# Patient Record
Sex: Male | Born: 1970 | Race: Black or African American | Hispanic: No | Marital: Single | Smoking: Former smoker
Health system: Southern US, Community
[De-identification: ages and names within clinical notes are randomized; demographics above are authoritative.]

## PROBLEM LIST (undated history)

## (undated) DIAGNOSIS — M1990 Unspecified osteoarthritis, unspecified site: Secondary | ICD-10-CM

## (undated) DIAGNOSIS — I10 Essential (primary) hypertension: Secondary | ICD-10-CM

## (undated) DIAGNOSIS — I429 Cardiomyopathy, unspecified: Secondary | ICD-10-CM

---

## 2003-10-15 ENCOUNTER — Emergency Department (HOSPITAL_COMMUNITY): Admission: EM | Admit: 2003-10-15 | Discharge: 2003-10-15 | Payer: Self-pay | Admitting: Emergency Medicine

## 2003-10-15 IMAGING — CR DG LUMBAR SPINE COMPLETE 4+V
5 series · 5 of 5 positions shown · non-contrast
Comparison: none

CLINICAL DATA: Low back pain radiating to right leg.  
LUMBAR SPINE  FOUR VIEWS
There is no evidence of lumbar spine fracture, spondylolysis, spondylolisthesis.  Mild degenerative disk space narrowing is seen at L4-5.  No other significant bony abnormalities are seen.
IMPRESSION
No acute radiographic findings.  
Mild degenerative disk disease.

[view not recorded (1 of 5)]
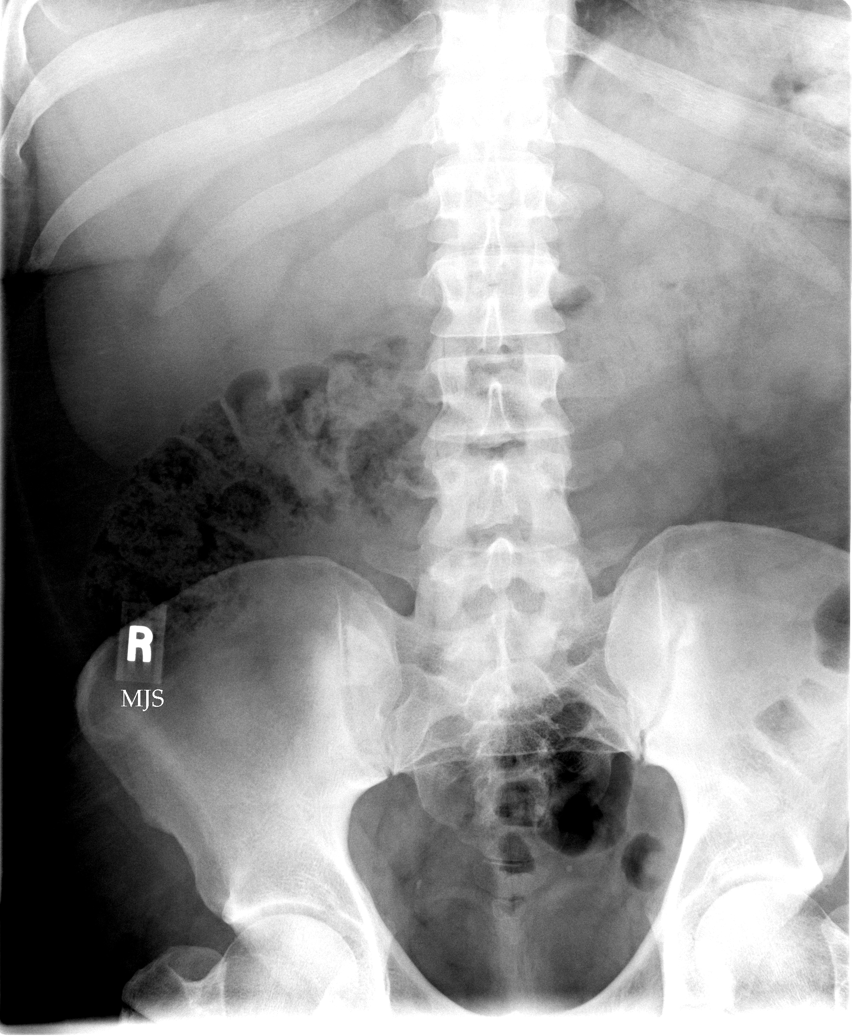

[view not recorded (2 of 5)]
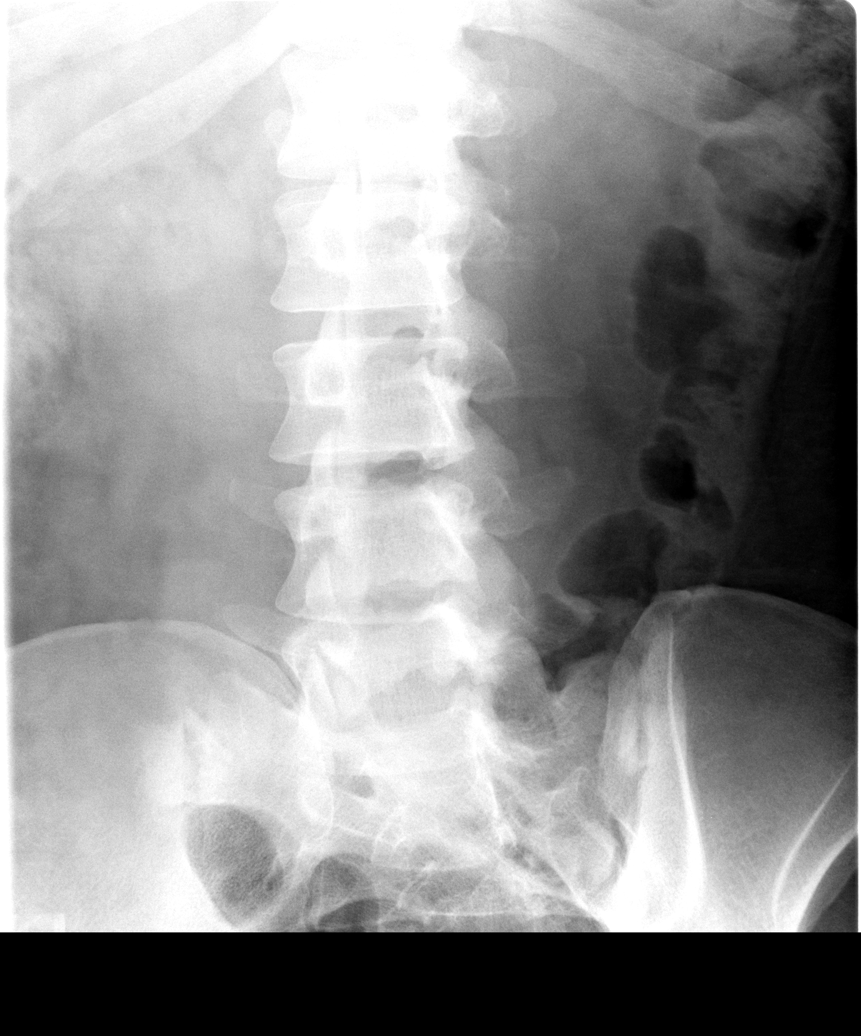

[view not recorded (3 of 5)]
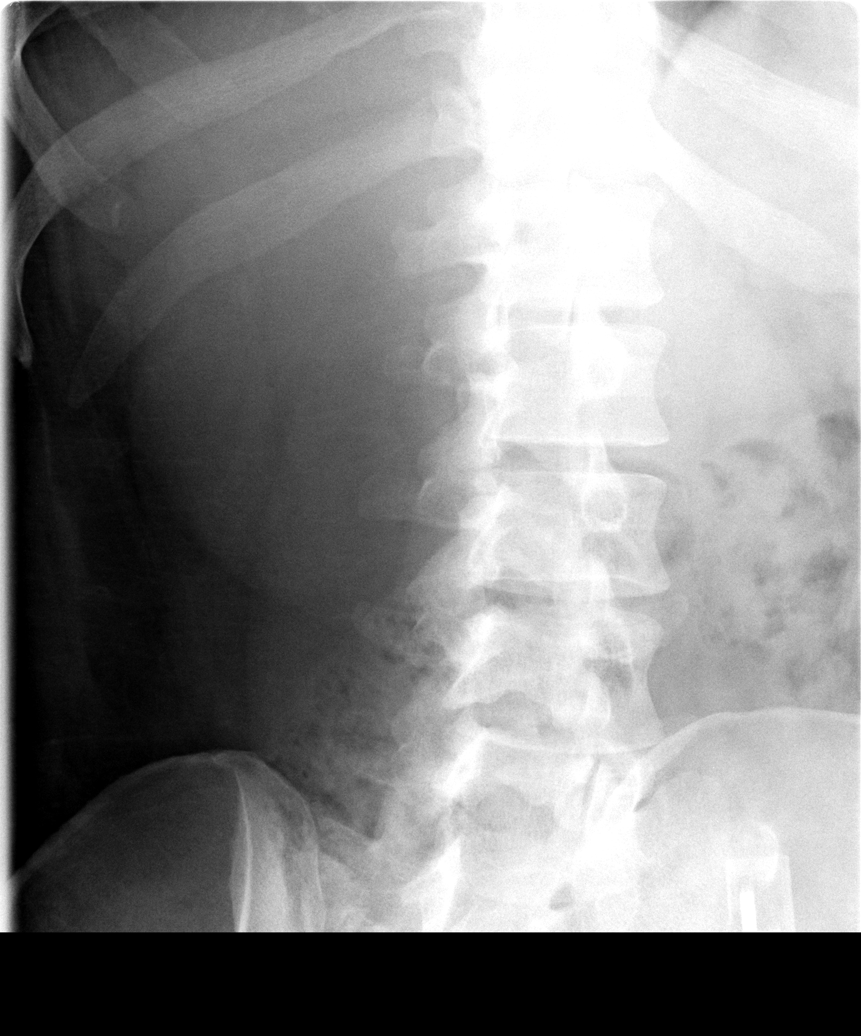

[view not recorded (4 of 5)]
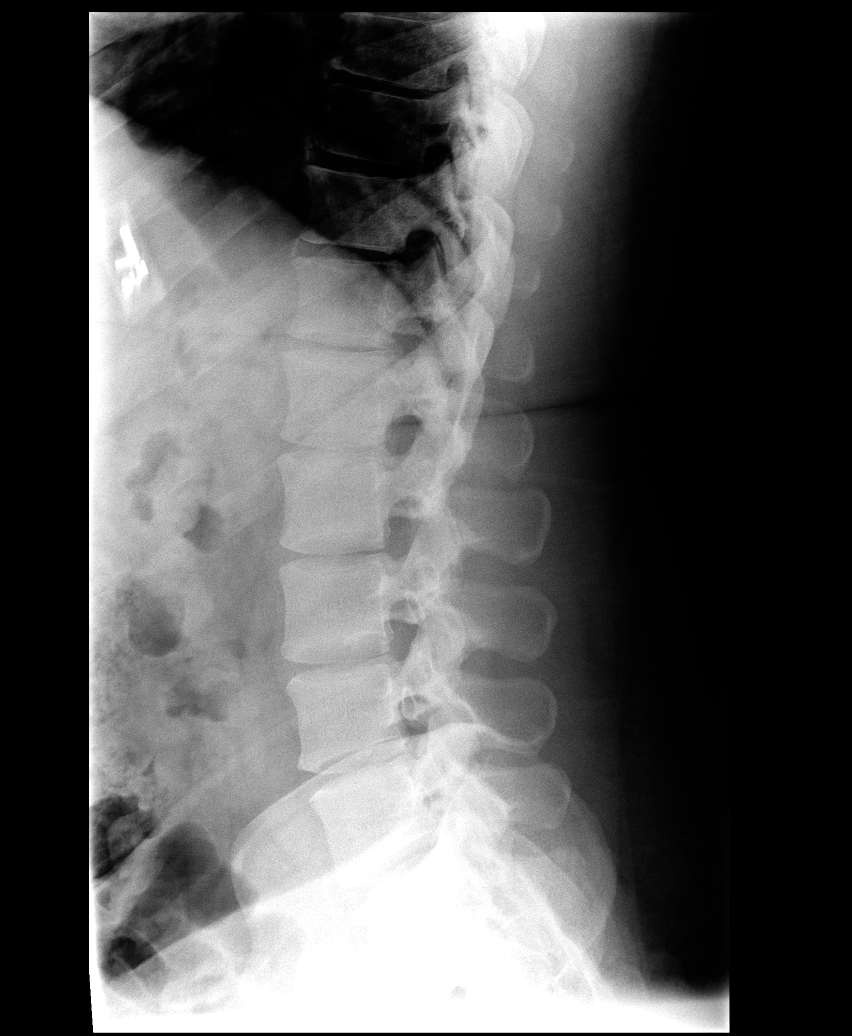

[view not recorded (5 of 5)]
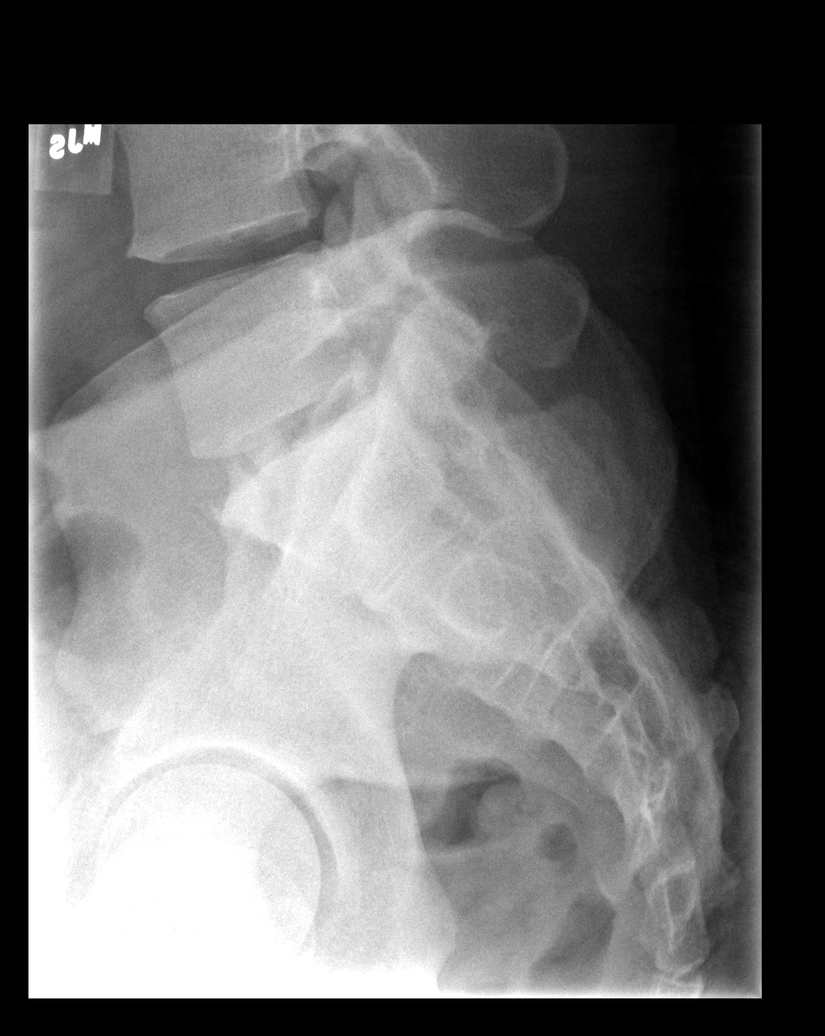

[5 of 5 positions shown; findings below may reference images not displayed]

## 2005-09-18 ENCOUNTER — Emergency Department (HOSPITAL_COMMUNITY): Admission: EM | Admit: 2005-09-18 | Discharge: 2005-09-18 | Payer: Self-pay | Admitting: Emergency Medicine

## 2007-01-01 ENCOUNTER — Emergency Department (HOSPITAL_COMMUNITY): Admission: EM | Admit: 2007-01-01 | Discharge: 2007-01-01 | Payer: Self-pay | Admitting: Emergency Medicine

## 2007-01-01 IMAGING — US US SCROTUM
1 series · 14 of 25 positions shown · non-contrast
Comparison: none

CLINICAL DATA: Left scrotal pain and mass.  
 SCROTAL ULTRASOUND:
 DOPPLER ULTRASOUND OF THE TESTICLES:
TECHNIQUE: Complete ultrasound examination of the testicles, epididymis, and other scrotal structures was performed.  Color and spectral Doppler ultrasound were also utilized to evaluate blood flow to the testicles.

[Series 1: unknown · 0.11mm/px · 14 of 50 slices shown]
[im 1/50]
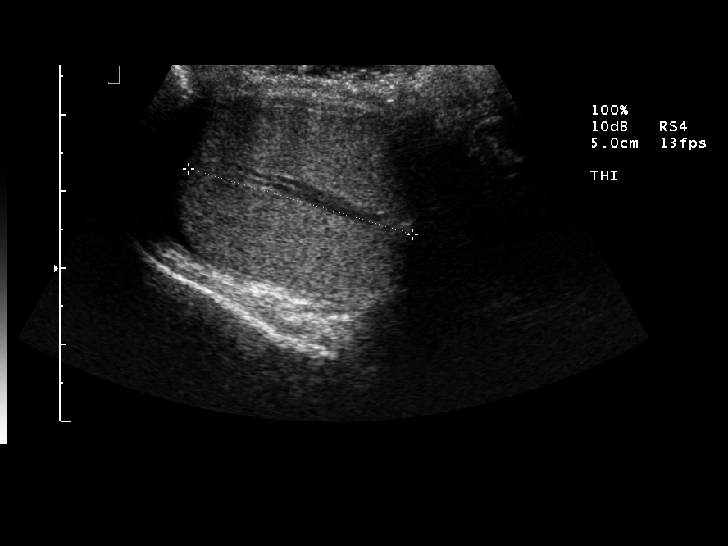
[im 5/50]
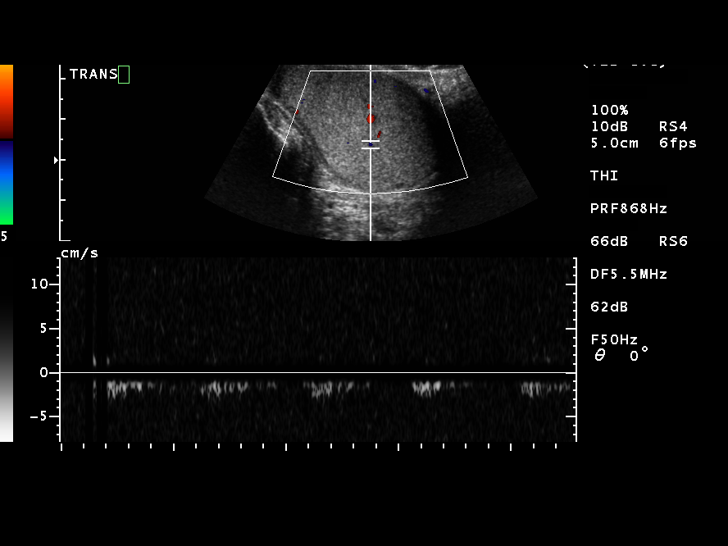
[im 9/50]
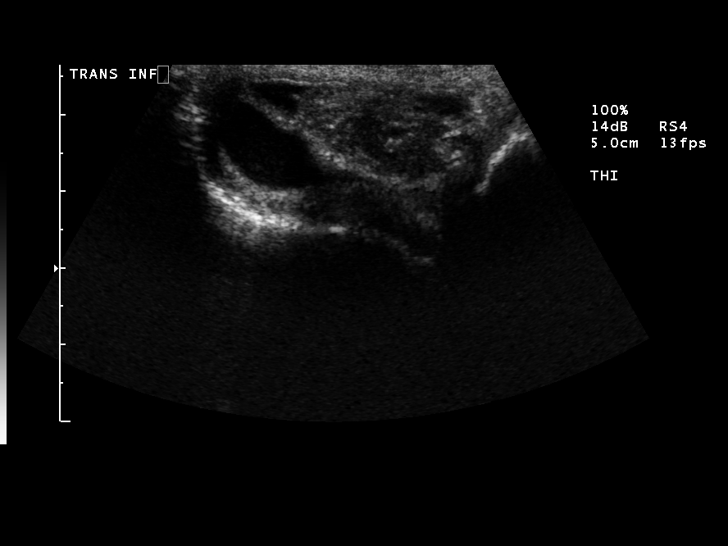
[im 13/50]
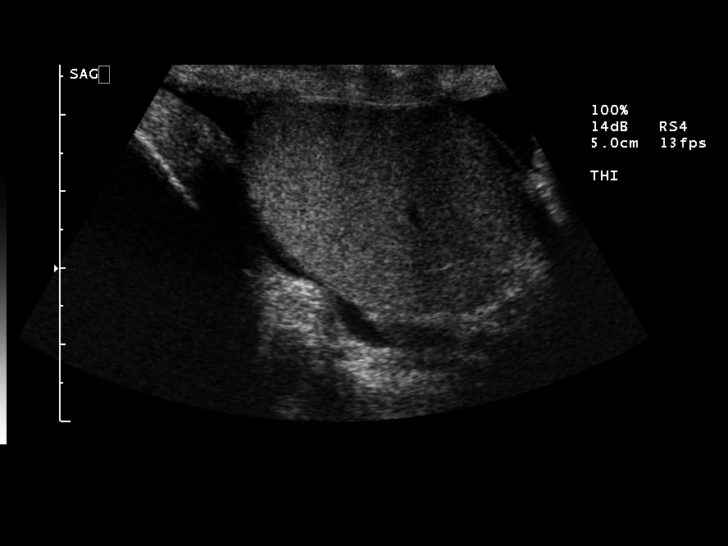
[im 17/50]
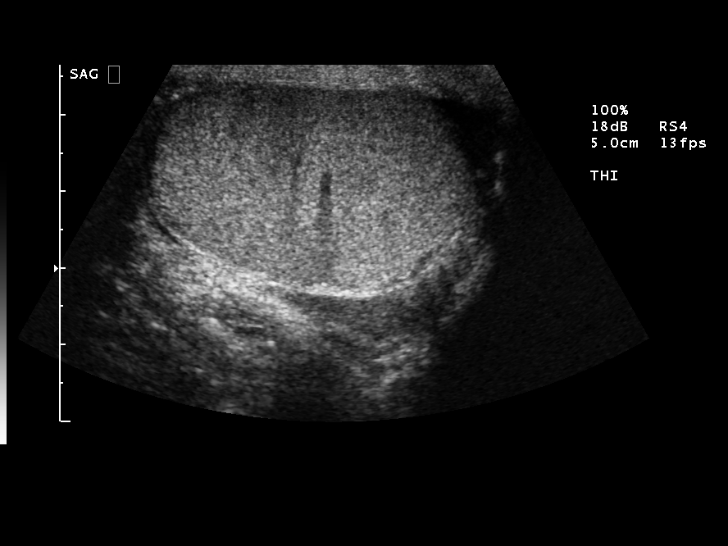
[im 19/50]
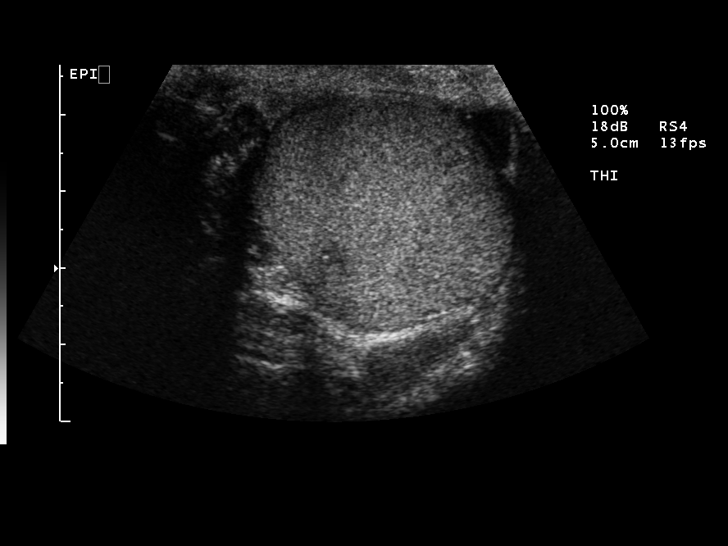
[im 23/50]
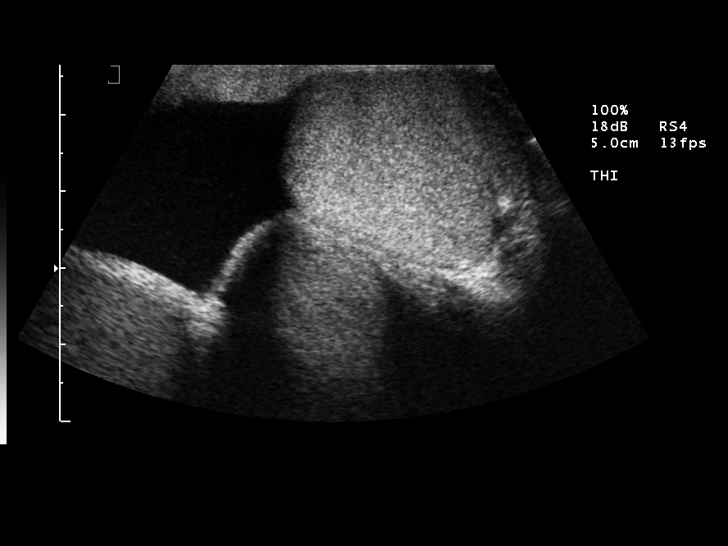
[im 27/50]
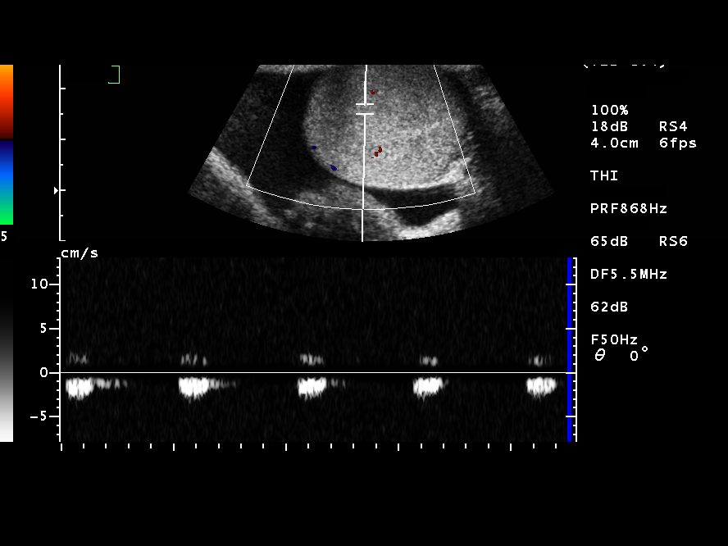
[im 31/50]
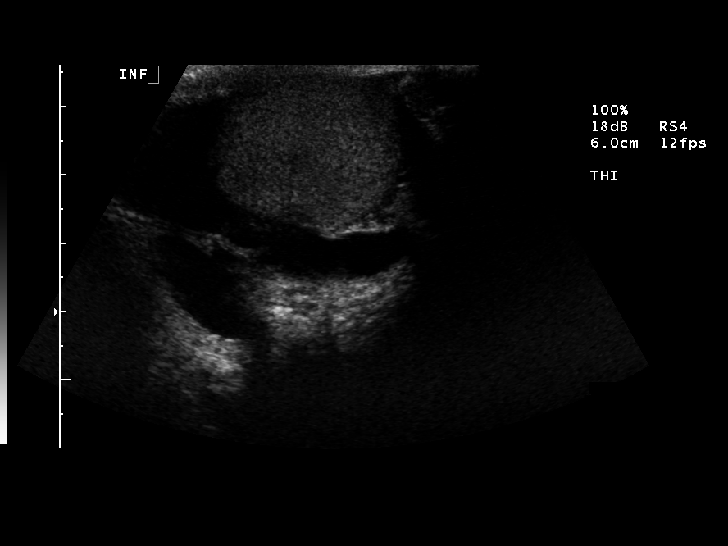
[im 33/50]
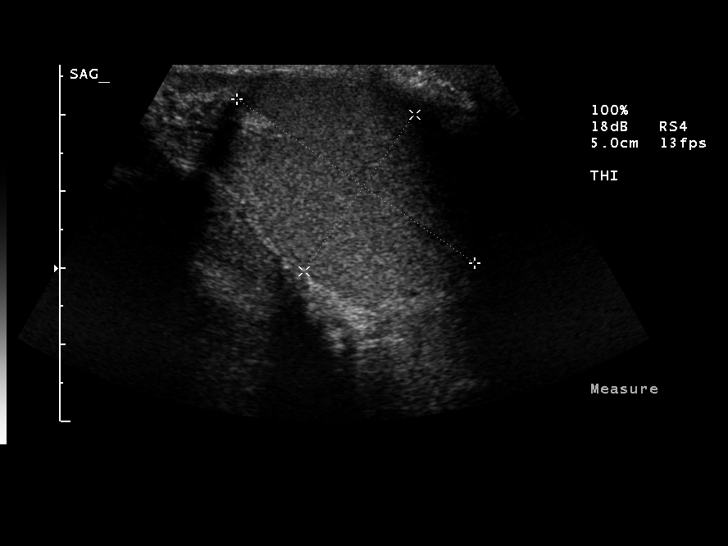
[im 37/50]
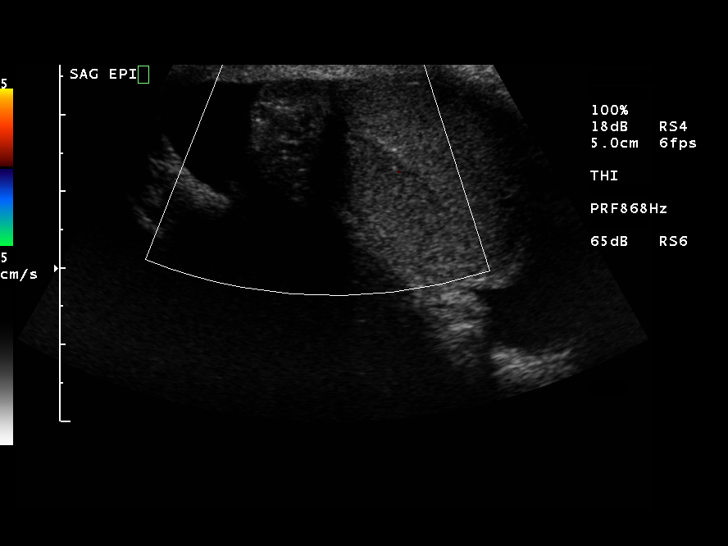
[im 41/50]
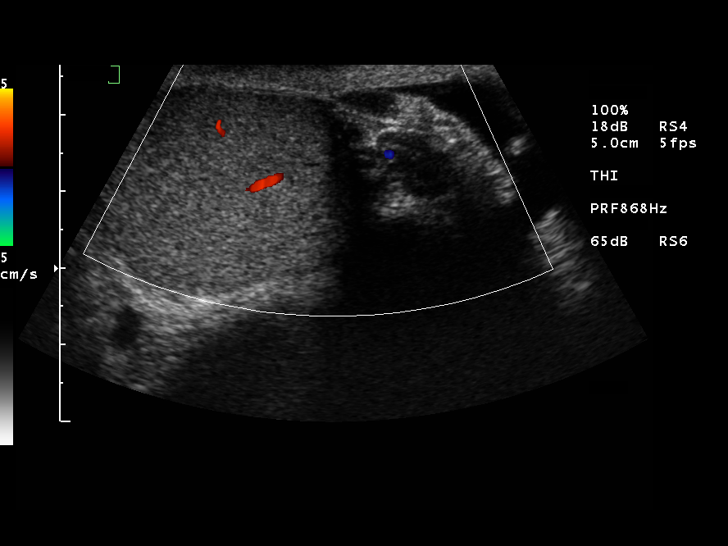
[im 45/50]
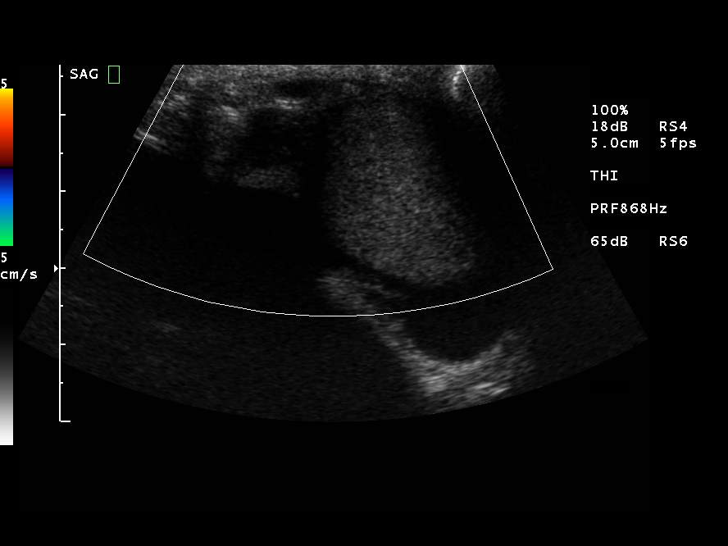
[im 50/50]
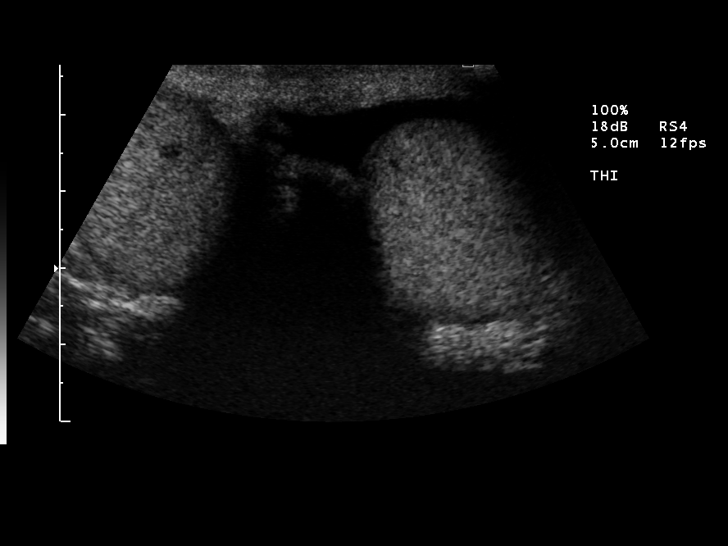

[14 of 25 positions shown; findings below may reference images not displayed]

FINDINGS: The testicles are symmetric in size and parenchymal echogenicity.  No testicular masses are seen.  
 Intratesticular blood flow is seen bilaterally on color Doppler ultrasound.  Spectral Doppler evaluation of the testicles shows the presence of both arterial and venous waveforms.  
 Large left hydrocele and small left hydrocele are visualized.  Left and right epididymides are unremarkable in appearance.
IMPRESSION: 1.  No evidence of testicular mass or torsion.  
 2.  Large left hydrocele.  Small right hydrocele.

## 2007-06-06 ENCOUNTER — Emergency Department (HOSPITAL_COMMUNITY): Admission: EM | Admit: 2007-06-06 | Discharge: 2007-06-06 | Payer: Self-pay | Admitting: Emergency Medicine

## 2007-06-08 ENCOUNTER — Emergency Department (HOSPITAL_COMMUNITY): Admission: EM | Admit: 2007-06-08 | Discharge: 2007-06-08 | Payer: Self-pay | Admitting: Emergency Medicine

## 2007-07-20 ENCOUNTER — Emergency Department (HOSPITAL_COMMUNITY): Admission: EM | Admit: 2007-07-20 | Discharge: 2007-07-20 | Payer: Self-pay | Admitting: Emergency Medicine

## 2007-11-12 ENCOUNTER — Emergency Department (HOSPITAL_COMMUNITY): Admission: EM | Admit: 2007-11-12 | Discharge: 2007-11-12 | Payer: Self-pay | Admitting: Emergency Medicine

## 2008-07-10 ENCOUNTER — Emergency Department (HOSPITAL_COMMUNITY): Admission: EM | Admit: 2008-07-10 | Discharge: 2008-07-10 | Payer: Self-pay | Admitting: Emergency Medicine

## 2009-01-27 ENCOUNTER — Emergency Department (HOSPITAL_COMMUNITY): Admission: EM | Admit: 2009-01-27 | Discharge: 2009-01-27 | Payer: Self-pay | Admitting: Family Medicine

## 2009-01-27 ENCOUNTER — Observation Stay (HOSPITAL_COMMUNITY): Admission: EM | Admit: 2009-01-27 | Discharge: 2009-01-29 | Payer: Self-pay | Admitting: Emergency Medicine

## 2009-01-27 IMAGING — CR DG CHEST 1V PORT
1 series · 1 of 1 positions shown · non-contrast
Comparison: None

CLINICAL DATA: History given of chest pain and shortness of breath.
History of tobacco smoking.  History of pain and left arm.

PORTABLE CHEST - 1 VIEW

[view not recorded]
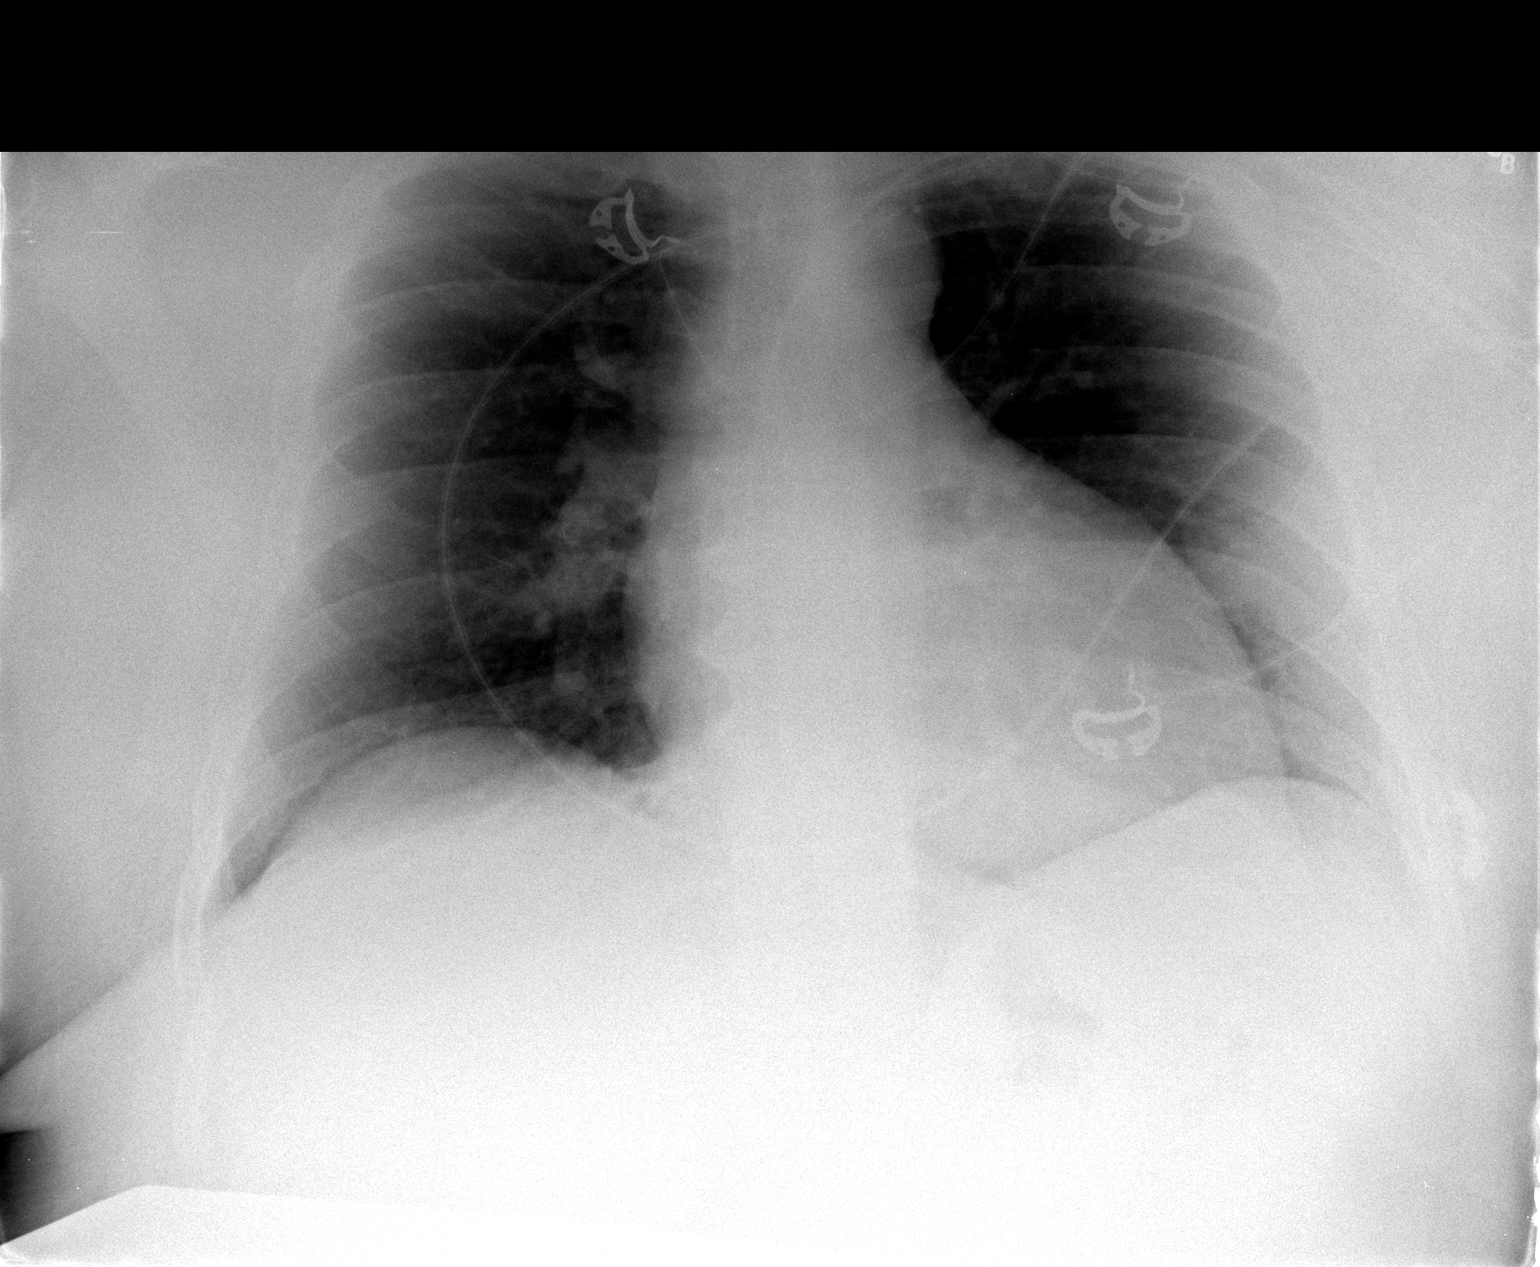

[1 of 1 positions shown; findings below may reference images not displayed]

FINDINGS: Cardiac silhouette is at the upper range of normal size.
No pleural abnormality is evident. The lungs are well aerated and
free of infiltrates. Osteophytes are seen in the spine.
IMPRESSION: Cardiac silhouette is upper limits of normal size accentuated by AP
magnification.  No pulmonary edema, pneumonia, or pleural effusion
is seen.  Osteophytes are present in the spine.

## 2010-01-29 ENCOUNTER — Emergency Department (HOSPITAL_COMMUNITY): Admission: EM | Admit: 2010-01-29 | Discharge: 2010-01-29 | Payer: Self-pay | Admitting: Family Medicine

## 2010-01-29 IMAGING — CR DG ANKLE COMPLETE 3+V*R*
5 series · 5 of 5 positions shown · non-contrast
Comparison: None

CLINICAL DATA: Injured ankle.

RIGHT ANKLE - COMPLETE 3+ VIEW

[view not recorded (1 of 5)]
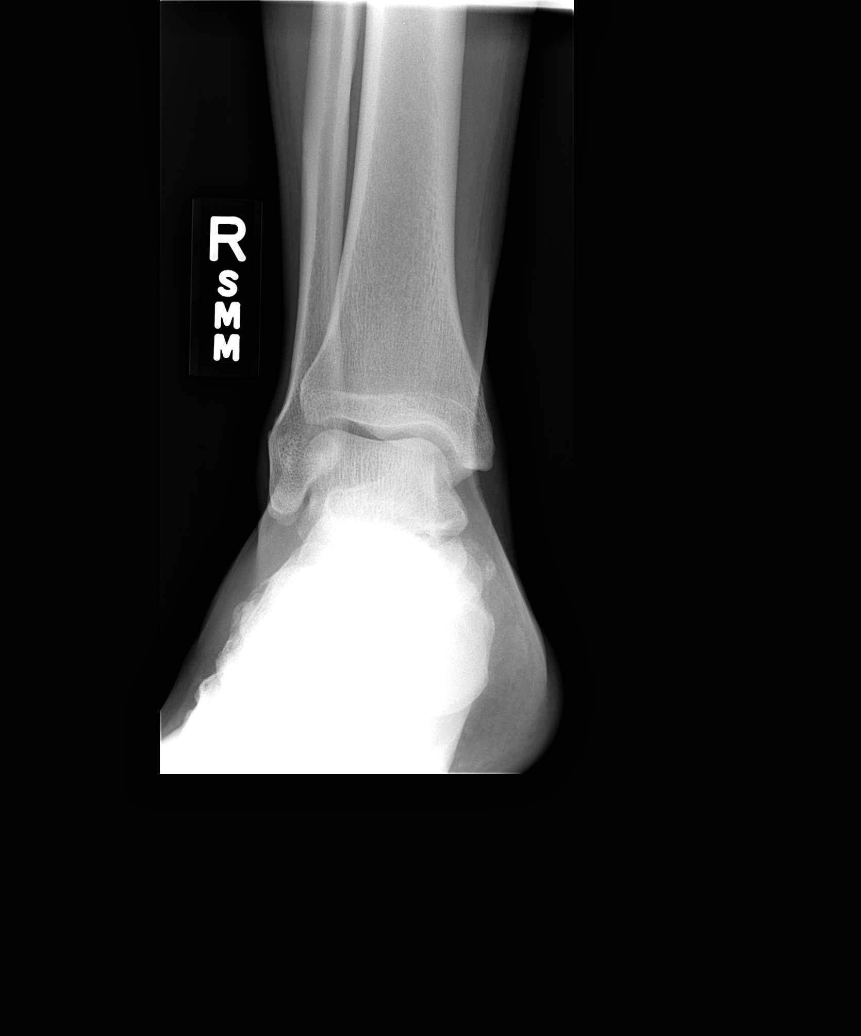

[view not recorded (2 of 5)]
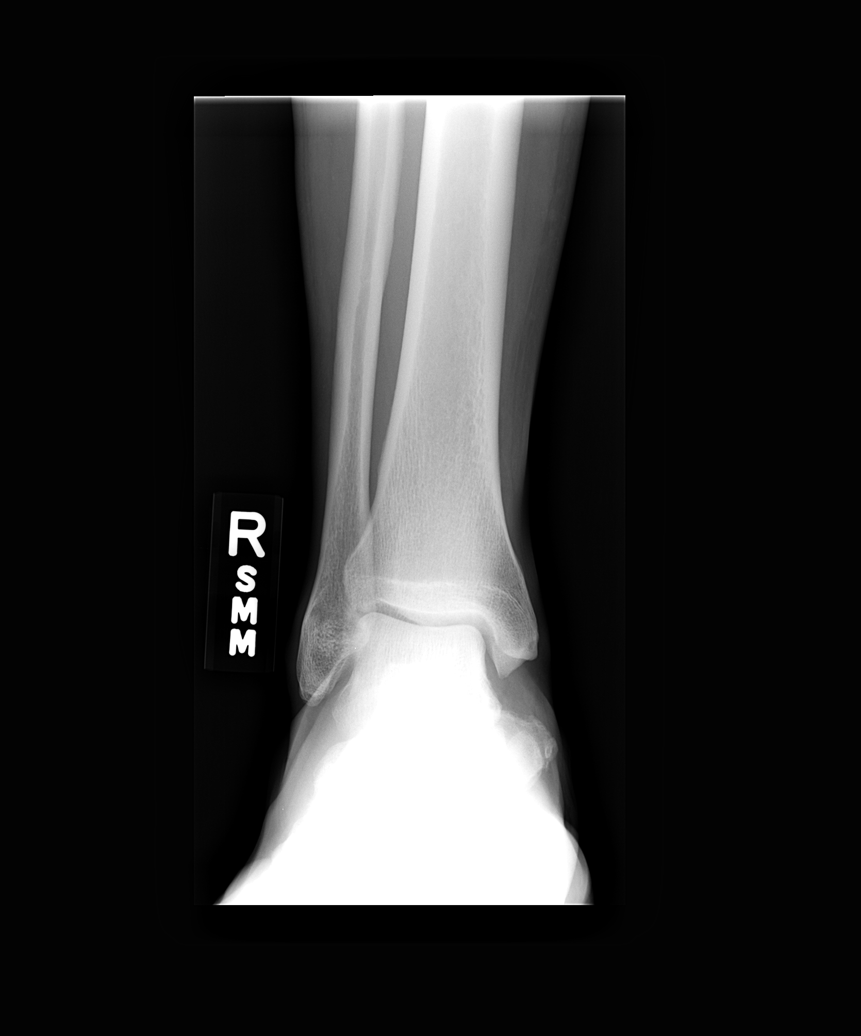

[view not recorded (3 of 5)]
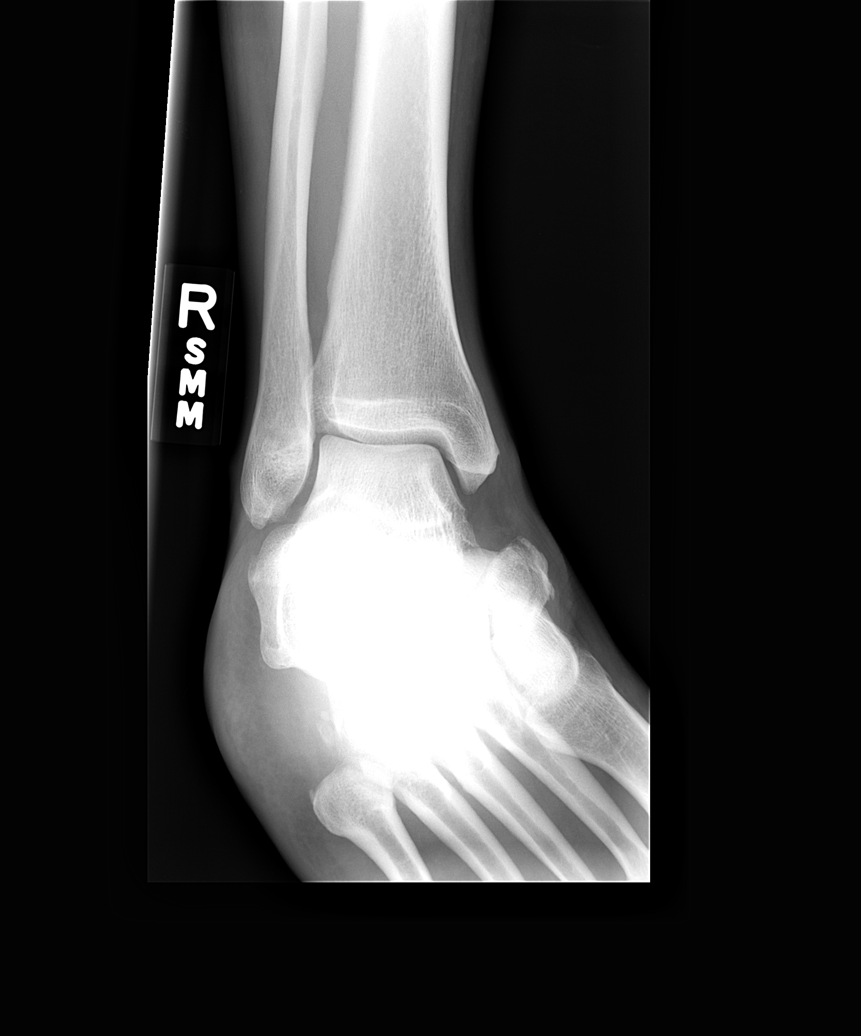

[view not recorded (4 of 5)]
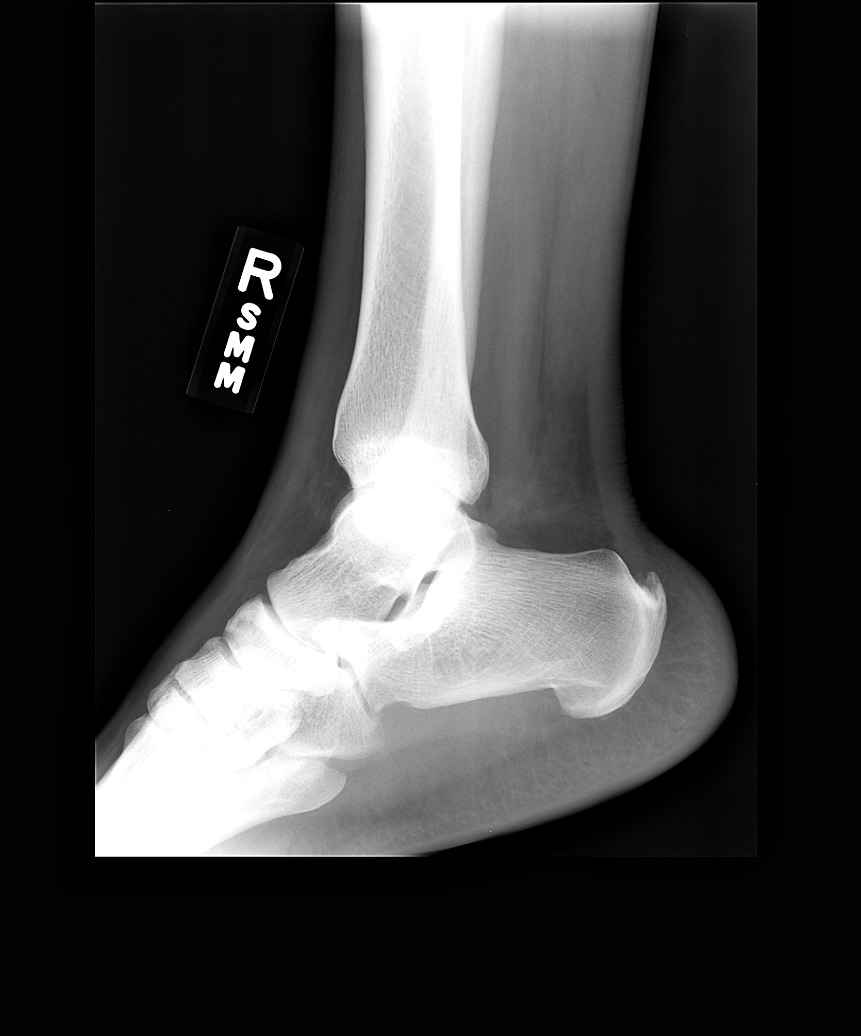

[view not recorded (5 of 5)]
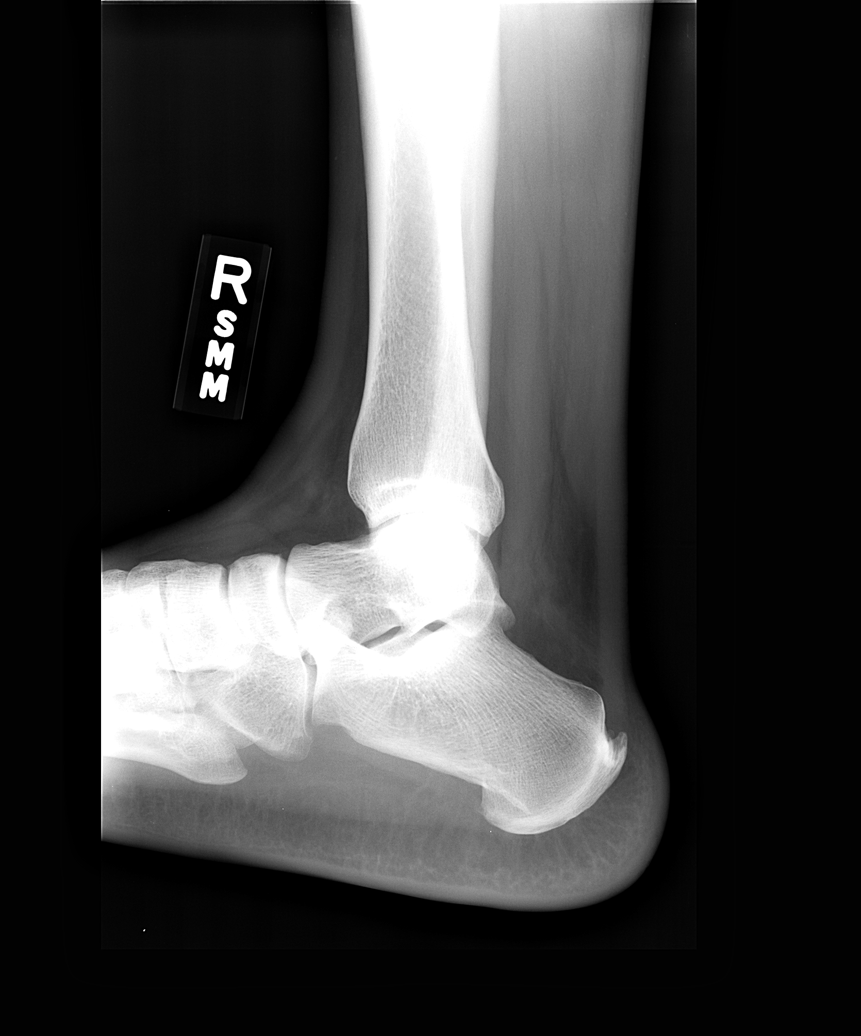

[5 of 5 positions shown; findings below may reference images not displayed]

FINDINGS: The ankle mortise is maintained.  No acute ankle
fracture.  Mild spurring change noted at the Achilles attachment
site on the calcaneus.  Os naviculare is noted.  No osteochondral
lesions.
IMPRESSION: No acute bony findings.

## 2010-08-06 LAB — PROTIME-INR
INR: 1 (ref 0.00–1.49)
Prothrombin Time: 13.1 seconds (ref 11.6–15.2)

## 2010-08-06 LAB — HEMOGLOBIN A1C: Mean Plasma Glucose: 128 mg/dL

## 2010-08-06 LAB — COMPREHENSIVE METABOLIC PANEL
ALT: 63 U/L — ABNORMAL HIGH (ref 0–53)
Calcium: 9.4 mg/dL (ref 8.4–10.5)
Chloride: 107 mEq/L (ref 96–112)
Creatinine, Ser: 0.97 mg/dL (ref 0.4–1.5)
GFR calc Af Amer: >60 mL/min (ref >60)
Potassium: 3.7 mEq/L (ref 3.5–5.1)
Total Protein: 7.6 g/dL (ref 6.0–8.3)

## 2010-08-06 LAB — TSH: TSH: 1.825 u[IU]/mL (ref 0.350–4.500)

## 2010-08-06 LAB — BASIC METABOLIC PANEL
BUN: 12 mg/dL (ref 6–23)
CO2: 26 mEq/L (ref 19–32)
Chloride: 103 mEq/L (ref 96–112)
Creatinine, Ser: 1.06 mg/dL (ref 0.4–1.5)
GFR calc Af Amer: >60 mL/min (ref >60)
GFR calc non Af Amer: >60 mL/min (ref >60)
Sodium: 139 mEq/L (ref 135–145)

## 2010-08-06 LAB — CARDIAC PANEL(CRET KIN+CKTOT+MB+TROPI)
CK, MB: 1.3 ng/mL (ref 0.3–4.0)
Relative Index: 0.5 (ref 0.0–2.5)
Relative Index: 0.7 (ref 0.0–2.5)
Relative Index: 0.7 (ref 0.0–2.5)
Total CK: 219 U/L (ref 7–232)
Troponin I: 0.02 ng/mL (ref 0.00–0.06)

## 2010-08-06 LAB — POCT I-STAT, CHEM 8
Calcium, Ion: 1.2 mmol/L (ref 1.12–1.32)
Glucose, Bld: 82 mg/dL (ref 70–99)
Hemoglobin: 15.6 g/dL (ref 13.0–17.0)
TCO2: 27 mmol/L (ref 0–100)

## 2010-08-06 LAB — POCT CARDIAC MARKERS
CKMB, poc: <1 ng/mL — ABNORMAL LOW (ref 1.0–8.0)
Myoglobin, poc: 122 ng/mL (ref 12–200)
Myoglobin, poc: 124 ng/mL (ref 12–200)
Troponin i, poc: <0.05 ng/mL (ref 0.00–0.09)

## 2010-08-06 LAB — LIPID PANEL
Cholesterol: 224 mg/dL — ABNORMAL HIGH (ref 0–200)
LDL Cholesterol: 153 mg/dL — ABNORMAL HIGH (ref 0–99)
Total CHOL/HDL Ratio: 6.1 RATIO
VLDL: 34 mg/dL (ref 0–40)

## 2010-08-06 LAB — CBC
Hemoglobin: 15.3 g/dL (ref 13.0–17.0)
Platelets: 277 10*3/uL (ref 150–400)
Platelets: 290 10*3/uL (ref 150–400)
RDW: 13.4 % (ref 11.5–15.5)
WBC: 7.4 10*3/uL (ref 4.0–10.5)
WBC: 8.3 10*3/uL (ref 4.0–10.5)

## 2010-08-06 LAB — DIFFERENTIAL
Eosinophils Absolute: 0.1 10*3/uL (ref 0.0–0.7)
Lymphocytes Relative: 30 % (ref 12–46)
Lymphs Abs: 2.2 10*3/uL (ref 0.7–4.0)
Monocytes Relative: 6 % (ref 3–12)
Neutro Abs: 4.6 10*3/uL (ref 1.7–7.7)
Neutrophils Relative %: 62 % (ref 43–77)

## 2010-08-06 LAB — APTT: aPTT: 28 seconds (ref 24–37)

## 2010-08-12 LAB — URINALYSIS, ROUTINE W REFLEX MICROSCOPIC
Ketones, ur: NEGATIVE mg/dL
Protein, ur: NEGATIVE mg/dL
Specific Gravity, Urine: 1.027 (ref 1.005–1.030)
Urobilinogen, UA: 0.2 mg/dL (ref 0.0–1.0)

## 2010-08-12 LAB — GC/CHLAMYDIA PROBE AMP, GENITAL
Chlamydia, DNA Probe: NEGATIVE
GC Probe Amp, Genital: NEGATIVE

## 2010-12-24 ENCOUNTER — Ambulatory Visit (INDEPENDENT_AMBULATORY_CARE_PROVIDER_SITE_OTHER): Payer: Self-pay

## 2010-12-24 ENCOUNTER — Inpatient Hospital Stay (INDEPENDENT_AMBULATORY_CARE_PROVIDER_SITE_OTHER)
Admission: RE | Admit: 2010-12-24 | Discharge: 2010-12-24 | Disposition: A | Discharge status: Home / Self Care | Payer: Self-pay | Source: Ambulatory Visit | Attending: Family Medicine | Admitting: Family Medicine

## 2010-12-24 DIAGNOSIS — M1990 Unspecified osteoarthritis, unspecified site: Secondary | ICD-10-CM

## 2010-12-24 DIAGNOSIS — I10 Essential (primary) hypertension: Secondary | ICD-10-CM

## 2010-12-24 LAB — POCT I-STAT, CHEM 8
BUN: 13 mg/dL (ref 6–23)
Creatinine, Ser: 1.2 mg/dL (ref 0.50–1.35)
Glucose, Bld: 100 mg/dL — ABNORMAL HIGH (ref 70–99)
Hemoglobin: 16 g/dL (ref 13.0–17.0)
TCO2: 28 mmol/L (ref 0–100)

## 2010-12-24 IMAGING — CR DG KNEE COMPLETE 4+V*R*
5 series · 5 of 5 positions shown · non-contrast
Comparison: None.

CLINICAL DATA: Right knee pain.  Paroxysmal onset of pain.  No
injury.

RIGHT KNEE - COMPLETE 4+ VIEW

[view not recorded (1 of 5)]
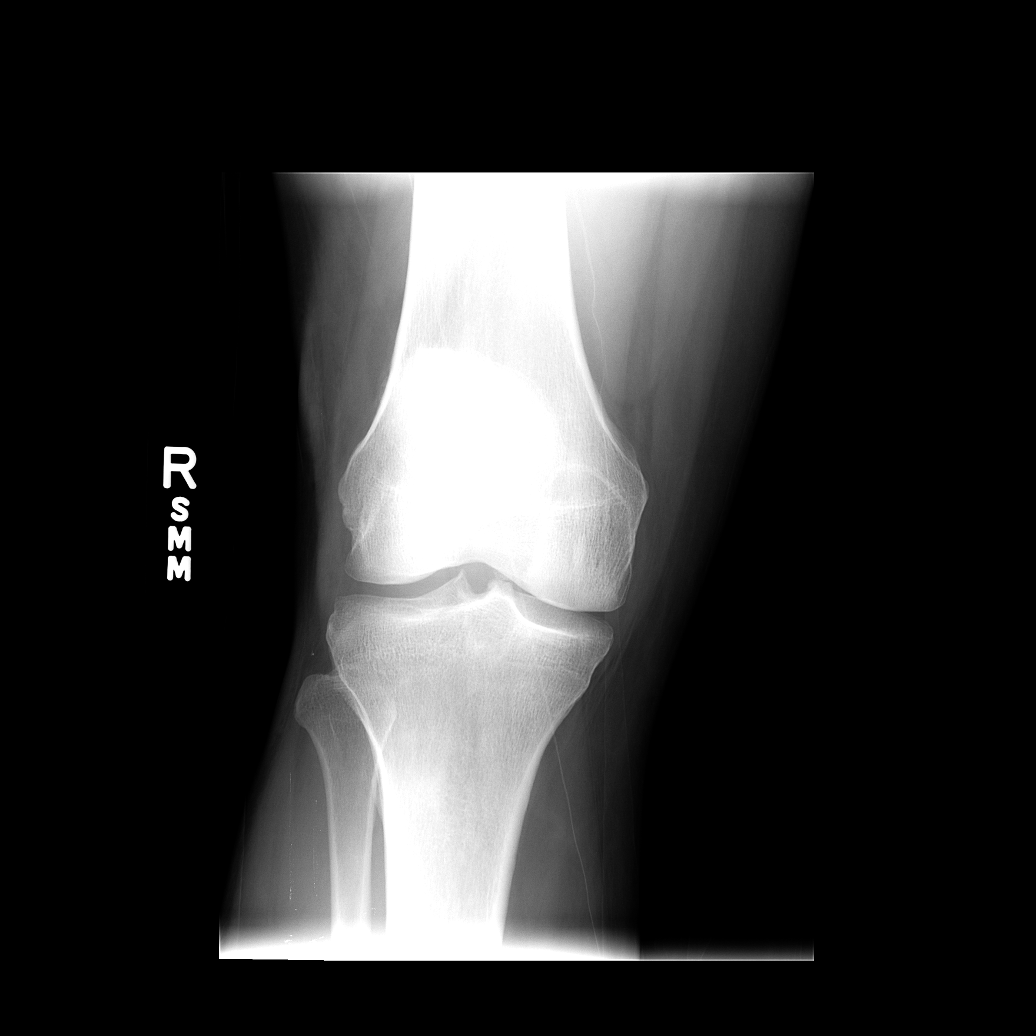

[view not recorded (2 of 5)]
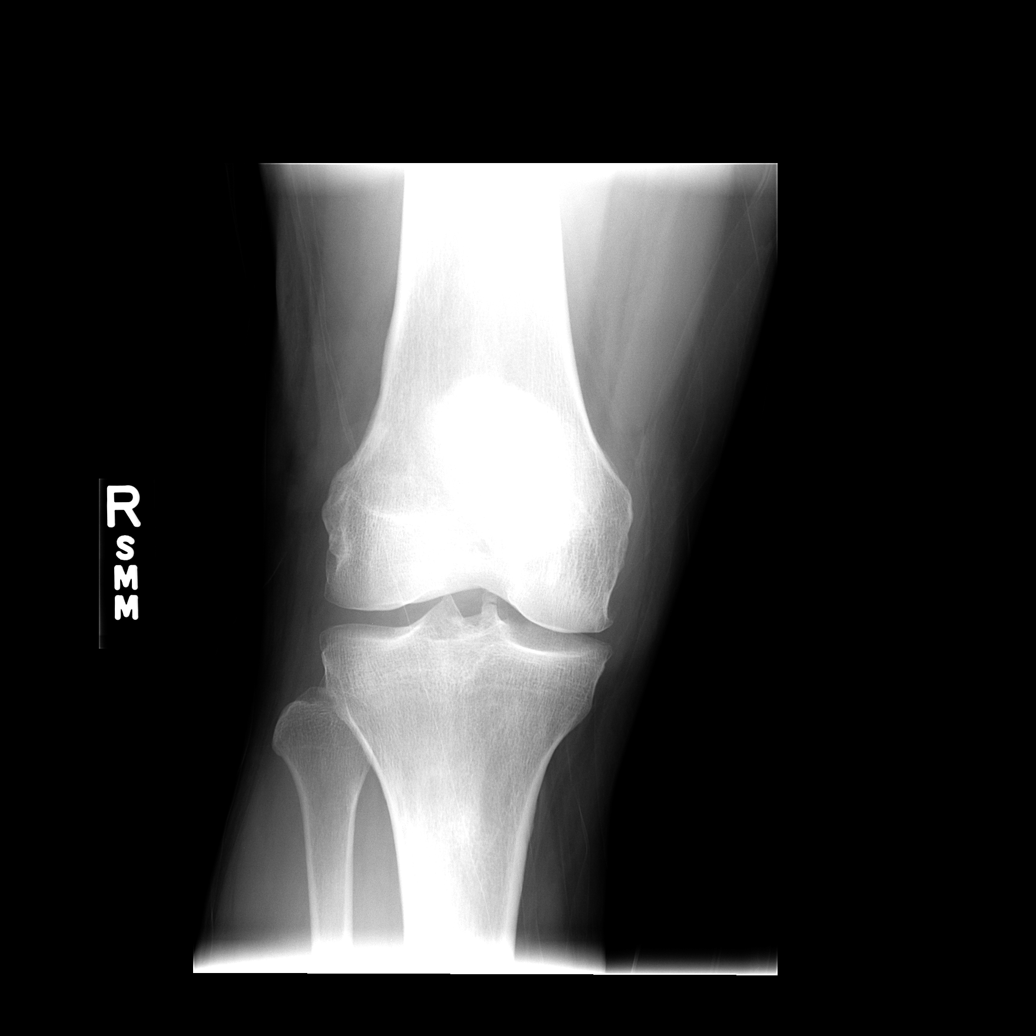

[view not recorded (3 of 5)]
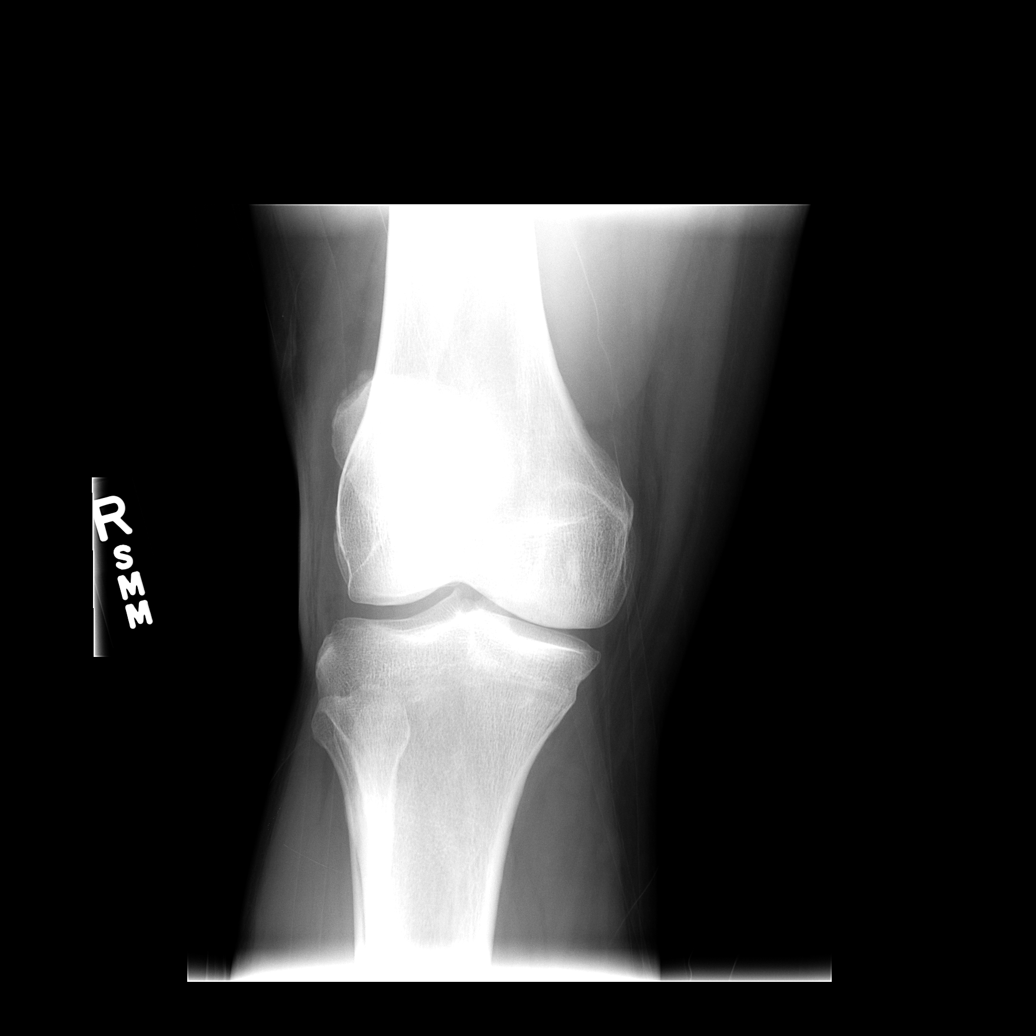

[view not recorded (4 of 5)]
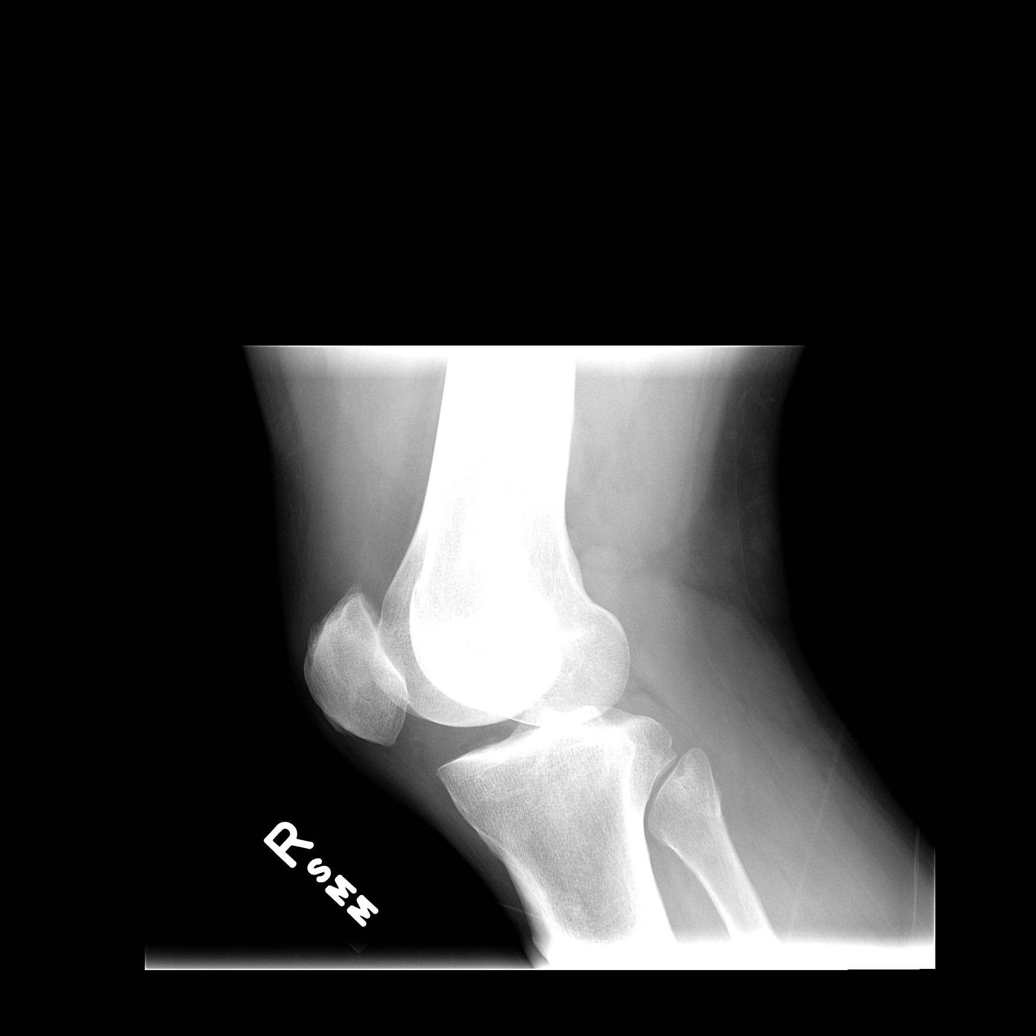

[view not recorded (5 of 5)]
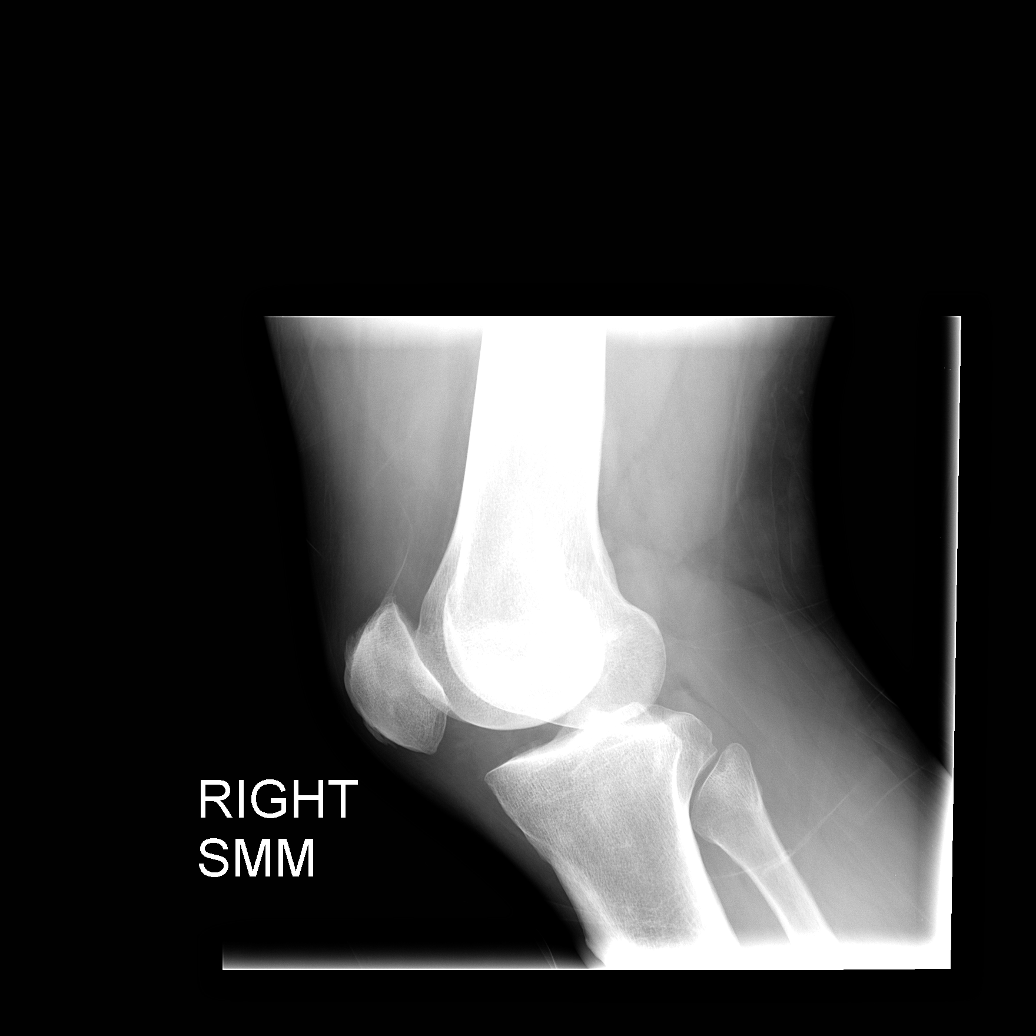

[5 of 5 positions shown; findings below may reference images not displayed]

FINDINGS: There is an old medial tibial spine fracture which is
nondisplaced.  Mild medial compartment osteoarthritis is present
with small marginal osteophytes.  There is no effusion.  Mild
patellofemoral osteoarthritis is present.  No fracture.  Anatomic
alignment.
IMPRESSION: Mild medial and patellofemoral osteoarthritis.

## 2011-01-21 LAB — URINALYSIS, ROUTINE W REFLEX MICROSCOPIC
Glucose, UA: NEGATIVE
Hgb urine dipstick: NEGATIVE
Hgb urine dipstick: NEGATIVE
Ketones, ur: NEGATIVE
Protein, ur: NEGATIVE
Protein, ur: NEGATIVE
Specific Gravity, Urine: 1.036 — ABNORMAL HIGH
Urobilinogen, UA: 1
pH: 7.5

## 2011-01-21 LAB — GC/CHLAMYDIA PROBE AMP, GENITAL
Chlamydia, DNA Probe: NEGATIVE
GC Probe Amp, Genital: NEGATIVE
GC Probe Amp, Genital: NEGATIVE

## 2011-01-27 LAB — URINALYSIS, ROUTINE W REFLEX MICROSCOPIC
Bilirubin Urine: NEGATIVE
Glucose, UA: NEGATIVE
Hgb urine dipstick: NEGATIVE
Ketones, ur: NEGATIVE
Protein, ur: NEGATIVE
pH: 5.5

## 2011-01-27 LAB — GC/CHLAMYDIA PROBE AMP, GENITAL
Chlamydia, DNA Probe: NEGATIVE
GC Probe Amp, Genital: NEGATIVE

## 2011-01-27 LAB — URINE MICROSCOPIC-ADD ON

## 2011-02-11 LAB — URINALYSIS, ROUTINE W REFLEX MICROSCOPIC
Hgb urine dipstick: NEGATIVE
Nitrite: NEGATIVE
Protein, ur: NEGATIVE
Specific Gravity, Urine: 1.017
Urobilinogen, UA: 1

## 2011-02-11 LAB — URINE MICROSCOPIC-ADD ON

## 2011-02-11 LAB — GC/CHLAMYDIA PROBE AMP, GENITAL: Chlamydia, DNA Probe: NEGATIVE

## 2011-02-11 LAB — URINE CULTURE: Culture: NO GROWTH

## 2014-04-29 ENCOUNTER — Emergency Department (INDEPENDENT_AMBULATORY_CARE_PROVIDER_SITE_OTHER)
Admission: EM | Admit: 2014-04-29 | Discharge: 2014-04-29 | Disposition: A | Discharge status: Home / Self Care | Payer: Self-pay | Source: Home / Self Care | Attending: Family Medicine | Admitting: Family Medicine

## 2014-04-29 ENCOUNTER — Encounter (HOSPITAL_COMMUNITY): Payer: Self-pay | Admitting: *Deleted

## 2014-04-29 ENCOUNTER — Emergency Department (INDEPENDENT_AMBULATORY_CARE_PROVIDER_SITE_OTHER): Payer: Self-pay

## 2014-04-29 DIAGNOSIS — M1711 Unilateral primary osteoarthritis, right knee: Secondary | ICD-10-CM

## 2014-04-29 DIAGNOSIS — M25579 Pain in unspecified ankle and joints of unspecified foot: Secondary | ICD-10-CM

## 2014-04-29 DIAGNOSIS — M25473 Effusion, unspecified ankle: Secondary | ICD-10-CM

## 2014-04-29 IMAGING — CR DG KNEE 1-2V*R*
2 series · 2 of 2 positions shown · non-contrast
Comparison: [DATE]

CLINICAL DATA: Pain and swelling.  No known injury.

EXAM:
RIGHT KNEE - 1-2 VIEW

[knee ap]
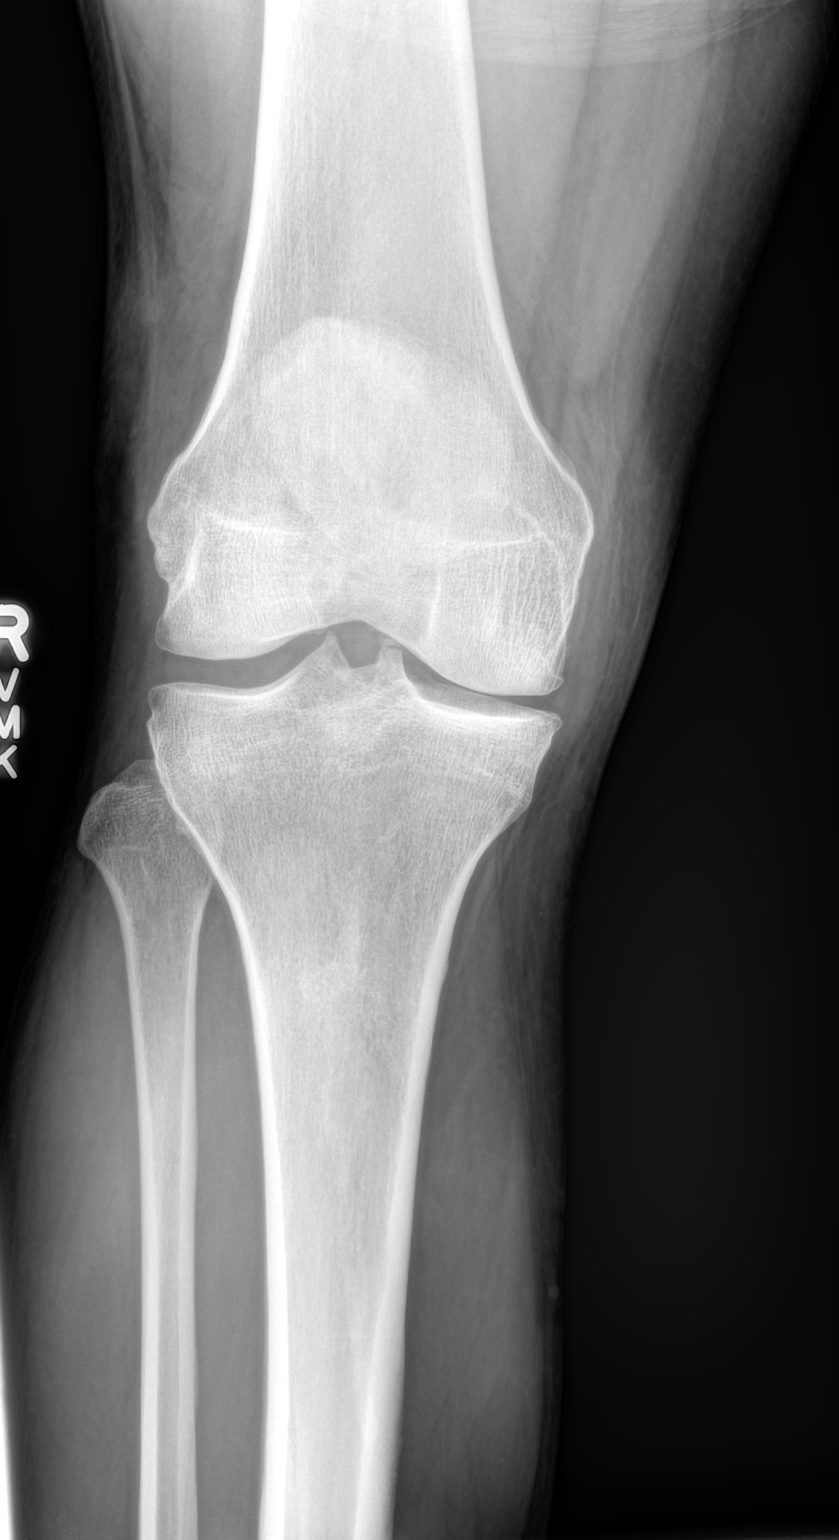

[knee lat]
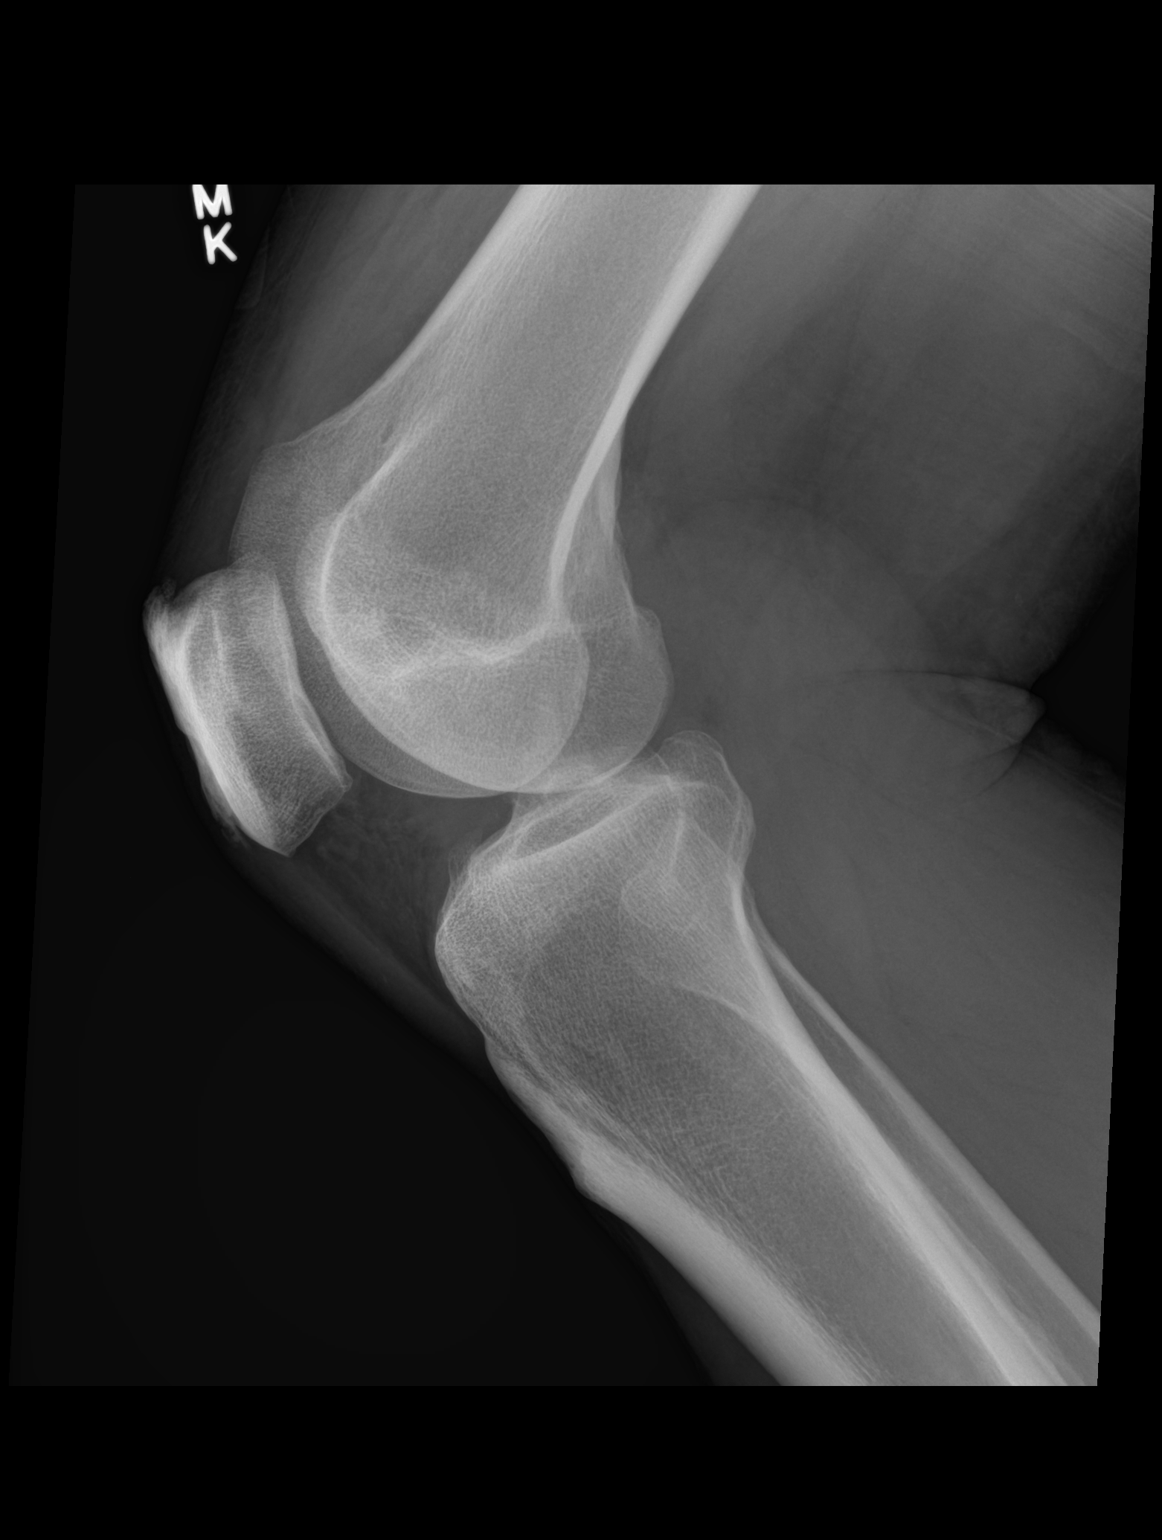

[2 of 2 positions shown; findings below may reference images not displayed]

FINDINGS: No visible effusion. There is medial compartment osteoarthritis with
mild joint space narrowing and marginal osteophytes. Patellofemoral
joint and lateral compartment appear within normal limits. No other
focal finding.
IMPRESSION: Medial compartment osteoarthritis with mild joint space narrowing
and marginal osteophytes, worsened since [DATE].

## 2014-04-29 MED ORDER — DICLOFENAC SODIUM 1 % TD GEL: 4.0000 g | Freq: Four times a day (QID) | TRANSDERMAL | Status: DC

## 2014-04-29 NOTE — Discharge Instructions (Signed)
See orthopedist for follow-up , use medicine as needed, see your doctor to restart blood pressure medicine.

## 2014-04-29 NOTE — ED Provider Notes (Signed)
CSN: [id number]     Arrival date & time 04/29/14  1504 History   First MD Initiated Contact with Patient 04/29/14 1533     Chief Complaint  Patient presents with  . Knee Pain   (Consider location/radiation/quality/duration/timing/severity/associated sxs/prior Treatment) Patient is a 43 y.o. male presenting with knee pain. The history is provided by the patient.  Knee Pain Location:  Knee Time since incident:  3 weeks Injury: no   Knee location:  R knee Pain details:    Quality:  Sharp   Radiates to:  Does not radiate   Severity:  Moderate   Onset quality:  Gradual Chronicity:  Chronic (told by prior eval and by his mother that he has arthritis.) Dislocation: no   Prior injury to area:  No Ineffective treatments:  Acetaminophen Associated symptoms: decreased ROM   Associated symptoms: no fever and no swelling     History reviewed. No pertinent past medical history. History reviewed. No pertinent past surgical history. History reviewed. No pertinent family history. History  Substance Use Topics  . Smoking status: Never Smoker   . Smokeless tobacco: Not on file  . Alcohol Use: No    Review of Systems  Constitutional: Negative.  Negative for fever.  Musculoskeletal: Positive for joint swelling and arthralgias. Negative for gait problem.  Skin: Negative.     Allergies  Review of patient's allergies indicates no known allergies.  Home Medications   Prior to Admission medications   Medication Sig Start Date End Date Taking? Authorizing Provider  diclofenac sodium (VOLTAREN) 1 % GEL Apply 4 g topically 4 (four) times daily. 04/29/14   Linna Hoff, MD   BP 169/106 mmHg  Pulse 89  Temp(Src) 99.1 F (37.3 C) (Oral)  Resp 16  SpO2 97% Physical Exam  Constitutional: He is oriented to person, place, and time. He appears well-developed and well-nourished.  Musculoskeletal: He exhibits tenderness.       Right knee: He exhibits swelling and bony tenderness. He exhibits  no effusion, normal patellar mobility, normal meniscus and no MCL laxity.  Neurological: He is alert and oriented to person, place, and time.  Skin: Skin is warm and dry.  Nursing note and vitals reviewed.   ED Course  Procedures (including critical care time) Labs Review Labs Reviewed - No data to display  Imaging Review Dg Knee 2 Views Right  04/29/2014   CLINICAL DATA:  Pain and swelling.  No known injury.  EXAM: RIGHT KNEE - 1-2 VIEW  COMPARISON:  12/24/2010  FINDINGS: No visible effusion. There is medial compartment osteoarthritis with mild joint space narrowing and marginal osteophytes. Patellofemoral joint and lateral compartment appear within normal limits. No other focal finding.  IMPRESSION: Medial compartment osteoarthritis with mild joint space narrowing and marginal osteophytes, worsened since 2012.   Electronically Signed   By: Paulina Fusi M.D.   On: 04/29/2014 16:02     MDM   1. Primary osteoarthritis of right knee   2. Pain and swelling of ankle   3. Pain and swelling of ankle        Linna Hoff, MD 04/29/14 [phone]

## 2014-04-29 NOTE — ED Notes (Addendum)
Pt reports  Pain /  Swelling  Of the  r  Knee     For  sev  Weeks    He  denys   Any  specefic  Injury however  Reports  Sensation  Of  Grinding  Sensation        Of the  Affected  Knee        Pt  Also reports  A  History  Of    HTN      Non  Compliant  On  His  bp meds    C/o occasonal  headache

## 2014-08-25 ENCOUNTER — Emergency Department (HOSPITAL_COMMUNITY)
Admission: EM | Admit: 2014-08-25 | Discharge: 2014-08-25 | Disposition: A | Discharge status: Home / Self Care | Payer: Self-pay | Attending: Emergency Medicine | Admitting: Emergency Medicine

## 2014-08-25 ENCOUNTER — Encounter (HOSPITAL_COMMUNITY): Payer: Self-pay | Admitting: *Deleted

## 2014-08-25 DIAGNOSIS — I10 Essential (primary) hypertension: Secondary | ICD-10-CM | POA: Insufficient documentation

## 2014-08-25 DIAGNOSIS — R079 Chest pain, unspecified: Secondary | ICD-10-CM | POA: Insufficient documentation

## 2014-08-25 DIAGNOSIS — Z79899 Other long term (current) drug therapy: Secondary | ICD-10-CM | POA: Insufficient documentation

## 2014-08-25 DIAGNOSIS — R51 Headache: Secondary | ICD-10-CM | POA: Insufficient documentation

## 2014-08-25 DIAGNOSIS — M1711 Unilateral primary osteoarthritis, right knee: Secondary | ICD-10-CM | POA: Insufficient documentation

## 2014-08-25 DIAGNOSIS — Z720 Tobacco use: Secondary | ICD-10-CM | POA: Insufficient documentation

## 2014-08-25 HISTORY — DX: Unspecified osteoarthritis, unspecified site: M19.90

## 2014-08-25 LAB — I-STAT CHEM 8, ED
BUN: 13 mg/dL (ref 6–23)
Calcium, Ion: 1.22 mmol/L (ref 1.12–1.23)
Chloride: 104 mmol/L (ref 96–112)
Creatinine, Ser: 1.2 mg/dL (ref 0.50–1.35)
Glucose, Bld: 87 mg/dL (ref 70–99)
HCT: 48 % (ref 39.0–52.0)
Hemoglobin: 16.3 g/dL (ref 13.0–17.0)
POTASSIUM: 3.8 mmol/L (ref 3.5–5.1)
Sodium: 142 mmol/L (ref 135–145)
TCO2: 22 mmol/L (ref 0–100)

## 2014-08-25 MED ORDER — BENAZEPRIL HCL 10 MG PO TABS: 10.0000 mg | ORAL_TABLET | Freq: Every day | ORAL | Status: DC

## 2014-08-25 MED ORDER — AMLODIPINE BESYLATE 10 MG PO TABS: 10.0000 mg | ORAL_TABLET | Freq: Every day | ORAL | Status: DC

## 2014-08-25 MED ORDER — MELOXICAM 7.5 MG PO TABS: 7.5000 mg | ORAL_TABLET | Freq: Every day | ORAL | Status: DC

## 2014-08-25 NOTE — ED Provider Notes (Signed)
CSN: [id number]     Arrival date & time 08/25/14  1649 History  This chart was scribed for non-physician practitioner, Felicie Morn, NP working with Doug Sou, MD by Greggory Stallion, ED scribe. This patient was seen in room TR11C/TR11C and the patient's care was started at 5:32 PM.   Chief Complaint  Patient presents with  . Knee Pain   The history is provided by the patient. No language interpreter was used.    HPI Comments: Ronald Young is a 44 y.o. male with history of arthritis and hypertension who presents to the Emergency Department complaining of right knee pain that started yesterday. He states this feels like past arthritis flare ups. Pt has taken tylenol with no relief. Bearing weight worsens pain. He was last evaluated for the same in December 2015 and given Voltaren with no relief. Pt has not taken his blood pressure medications in several months because he has been out. He reports intermittent headaches and random sharp chest pains because of it. Pt denies leg swelling, SOB, nausea, emesis, diarrhea.   Past Medical History  Diagnosis Date  . Arthritis    History reviewed. No pertinent past surgical history. No family history on file. History  Substance Use Topics  . Smoking status: Current Some Day Smoker    Types: Cigarettes  . Smokeless tobacco: Not on file  . Alcohol Use: No    Review of Systems  Respiratory: Negative for shortness of breath.   Cardiovascular: Positive for chest pain. Negative for leg swelling.  Gastrointestinal: Negative for nausea, vomiting and diarrhea.  Musculoskeletal: Positive for arthralgias.  Neurological: Positive for headaches.  All other systems reviewed and are negative.  Allergies  Review of patient's allergies indicates no known allergies.  Home Medications   Prior to Admission medications   Medication Sig Start Date End Date Taking? Authorizing Provider  diclofenac sodium (VOLTAREN) 1 % GEL Apply 4 g topically 4 (four) times  daily. 04/29/14   Linna Hoff, MD   BP 162/123 mmHg  Pulse 71  Temp(Src) 98.7 F (37.1 C) (Oral)  Resp 16  SpO2 99%   Physical Exam  Constitutional: He is oriented to person, place, and time. He appears well-developed and well-nourished. No distress.  HENT:  Head: Normocephalic and atraumatic.  Eyes: Conjunctivae and EOM are normal.  Neck: Neck supple. No tracheal deviation present.  Cardiovascular: Normal rate, regular rhythm and normal heart sounds.   Pulses intact.  Pulmonary/Chest: Effort normal and breath sounds normal. No respiratory distress.  Musculoskeletal: Normal range of motion.  No lower extremity edema. Full ROM of right knee. No swelling or warmth.   Neurological: He is alert and oriented to person, place, and time.  Skin: Skin is warm and dry.  Psychiatric: He has a normal mood and affect. His behavior is normal.  Nursing note and vitals reviewed.   ED Course  Procedures (including critical care time)  DIAGNOSTIC STUDIES: Oxygen Saturation is 99% on RA, normal by my interpretation.    COORDINATION OF CARE: 5:36 PM-Discussed treatment plan which includes lab work and an anti-inflammatory with pt at bedside and pt agreed to plan. Will give pt a referral to [facility] and advised him to follow up.   Labs Review Labs Reviewed - No data to display  Imaging Review No results found.   EKG Interpretation None      Date: 08/25/2014  Rate:72  Rhythm: normal sinus rhythm  QRS Axis: normal  Intervals: normal  ST/T Wave  abnormalities: non-specific t waves changes laterally  Conduction Disutrbances: none  Narrative Interpretation: NSR, [facility]  Old EKG Reviewed:  None available   Mr Musick presents today for evaluation of flare of arthritic pain in right knee.  Patient has been using tylenol without relief.  Has tried voltaren gel in the past without significant relief.  Patient is hypertensive on today's visit.  Hx of HTN, but has been without  medication "for a long time".  Patient endorses intermittent chest pain.  With history of untreated hypertension, will check bmet and obtain ECG.  No indication of renal insufficiency.  ECG with [facility].  Prescriptions provided for benazepril and amlodipine.  Patient instructed to take one in the morning, and the other in the evening.   Anti-inflammatory for knee pain.  Follow-up with [facility] to monitor response to anti-hypertensives. MDM   Final diagnoses:  None    Hypertension. Right knee pain.  I personally performed the services described in this documentation, which was scribed in my presence. The recorded information has been reviewed and is accurate.   Felicie Morn, NP 08/26/14 0300  Doug Sou, MD 08/26/14 [phone]

## 2014-08-25 NOTE — Discharge Instructions (Signed)
Hypertension °Hypertension, commonly called high blood pressure, is when the force of blood pumping through your arteries is too strong. Your arteries are the blood vessels that carry blood from your heart throughout your body. A blood pressure reading consists of a higher number over a lower number, such as 110/72. The higher number (systolic) is the pressure inside your arteries when your heart pumps. The lower number (diastolic) is the pressure inside your arteries when your heart relaxes. Ideally you want your blood pressure below 120/80. °Hypertension forces your heart to work harder to pump blood. Your arteries may become narrow or stiff. Having hypertension puts you at risk for heart disease, stroke, and other problems.  °RISK FACTORS °Some risk factors for high blood pressure are controllable. Others are not.  °Risk factors you cannot control include:  °· Race. You may be at higher risk if you are African American. °· Age. Risk increases with age. °· Gender. Men are at higher risk than women before age 45 years. After age 65, women are at higher risk than men. °Risk factors you can control include: °· Not getting enough exercise or physical activity. °· Being overweight. °· Getting too much fat, sugar, calories, or salt in your diet. °· Drinking too much alcohol. °SIGNS AND SYMPTOMS °Hypertension does not usually cause signs or symptoms. Extremely high blood pressure (hypertensive crisis) may cause headache, anxiety, shortness of breath, and nosebleed. °DIAGNOSIS  °To check if you have hypertension, your health care provider will measure your blood pressure while you are seated, with your arm held at the level of your heart. It should be measured at least twice using the same arm. Certain conditions can cause a difference in blood pressure between your right and left arms. A blood pressure reading that is higher than normal on one occasion does not mean that you need treatment. If one blood pressure reading  is high, ask your health care provider about having it checked again. °TREATMENT  °Treating high blood pressure includes making lifestyle changes and possibly taking medicine. Living a healthy lifestyle can help lower high blood pressure. You may need to change some of your habits. °Lifestyle changes may include: °· Following the DASH diet. This diet is high in fruits, vegetables, and whole grains. It is low in salt, red meat, and added sugars. °· Getting at least 2½ hours of brisk physical activity every week. °· Losing weight if necessary. °· Not smoking. °· Limiting alcoholic beverages. °· Learning ways to reduce stress. ° If lifestyle changes are not enough to get your blood pressure under control, your health care provider may prescribe medicine. You may need to take more than one. Work closely with your health care provider to understand the risks and benefits. °HOME CARE INSTRUCTIONS °· Have your blood pressure rechecked as directed by your health care provider.   °· Take medicines only as directed by your health care provider. Follow the directions carefully. Blood pressure medicines must be taken as prescribed. The medicine does not work as well when you skip doses. Skipping doses also puts you at risk for problems.   °· Do not smoke.   °· Monitor your blood pressure at home as directed by your health care provider.  °SEEK MEDICAL CARE IF:  °· You think you are having a reaction to medicines taken. °· You have recurrent headaches or feel dizzy. °· You have swelling in your ankles. °· You have trouble with your vision. °SEEK IMMEDIATE MEDICAL CARE IF: °· You develop a severe headache or confusion. °·   You have unusual weakness, numbness, or feel faint.  You have severe chest or abdominal pain.  You vomit repeatedly.  You have trouble breathing. MAKE SURE YOU:   Understand these instructions.  Will watch your condition.  Will get help right away if you are not doing well or get worse. Document  Released: 04/18/2005 Document Revised: 09/02/2013 Document Reviewed: 02/08/2013 [facility] Patient Information 2015 [place], [place]. This information is not intended to replace advice given to you by your health care provider. Make sure you discuss any questions you have with your health care provider. Arthritis, Nonspecific Arthritis is pain, redness, warmth, or puffiness (inflammation) of a joint. The joint may be stiff or hurt when you move it. One or more joints may be affected. There are many types of arthritis. Your doctor may not know what type you have right away. The most common cause of arthritis is wear and tear on the joint (osteoarthritis). HOME CARE   Only take medicine as told by your doctor.  Rest the joint as much as possible.  Raise (elevate) your joint if it is puffy.  Use crutches if the painful joint is in your leg.  Drink enough fluids to keep your pee (urine) clear or pale yellow.  Follow your doctor's diet instructions.  Use cold packs for very bad joint pain for 10 to 15 minutes every hour. Ask your doctor if it is okay for you to use hot packs.  Exercise as told by your doctor.  Take a warm shower if you have stiffness in the morning.  Move your sore joints throughout the day. GET HELP RIGHT AWAY IF:   You have a fever.  You have very bad joint pain, puffiness, or redness.  You have many joints that are painful and puffy.  You are not getting better with treatment.  You have very bad back pain or leg weakness.  You cannot control when you poop (bowel movement) or pee (urinate).  You do not feel better in 24 hours or are getting worse.  You are having side effects from your medicine. MAKE SURE YOU:   Understand these instructions.  Will watch your condition.  Will get help right away if you are not doing well or get worse. Document Released: 07/13/2009 Document Revised: 10/18/2011 Document Reviewed: 07/13/2009 [facility] Patient Information  2015 [place], [place]. This information is not intended to replace advice given to you by your health care provider. Make sure you discuss any questions you have with your health care provider.

## 2014-08-25 NOTE — ED Notes (Signed)
Pt c/o rt knee arthritis pain since yesterday. States that he took tylenol with no relief.

## 2014-08-29 ENCOUNTER — Emergency Department (HOSPITAL_COMMUNITY)
Admission: EM | Admit: 2014-08-29 | Discharge: 2014-08-29 | Disposition: A | Discharge status: Home / Self Care | Payer: Self-pay | Attending: Emergency Medicine | Admitting: Emergency Medicine

## 2014-08-29 ENCOUNTER — Encounter (HOSPITAL_COMMUNITY): Payer: Self-pay | Admitting: *Deleted

## 2014-08-29 DIAGNOSIS — Z8739 Personal history of other diseases of the musculoskeletal system and connective tissue: Secondary | ICD-10-CM | POA: Insufficient documentation

## 2014-08-29 DIAGNOSIS — Z720 Tobacco use: Secondary | ICD-10-CM | POA: Insufficient documentation

## 2014-08-29 DIAGNOSIS — I10 Essential (primary) hypertension: Secondary | ICD-10-CM | POA: Insufficient documentation

## 2014-08-29 HISTORY — DX: Essential (primary) hypertension: I10

## 2014-08-29 MED ORDER — LISINOPRIL 10 MG PO TABS
10.0000 mg | ORAL_TABLET | Freq: Every day | ORAL | Status: DC
  Administered 2014-08-29: 10 mg via ORAL
  Filled 2014-08-29: qty 1

## 2014-08-29 MED ORDER — LISINOPRIL-HYDROCHLOROTHIAZIDE 10-12.5 MG PO TABS: 1.0000 | ORAL_TABLET | Freq: Every day | ORAL | Status: DC

## 2014-08-29 MED ORDER — HYDROCHLOROTHIAZIDE 12.5 MG PO CAPS
12.5000 mg | ORAL_CAPSULE | Freq: Every day | ORAL | Status: DC
  Administered 2014-08-29: 12.5 mg via ORAL
  Filled 2014-08-29: qty 1

## 2014-08-29 NOTE — Discharge Instructions (Signed)

## 2014-08-29 NOTE — ED Provider Notes (Signed)
CSN: [id number]     Arrival date & time 08/29/14  1411 History   First MD Initiated Contact with Patient 08/29/14 1624     Chief Complaint  Patient presents with  . Headache  . Hypertension    HPI Patient presents to the emergency room for follow-up evaluation of his hypertension. She was seen in the emergency department a couple of days ago because he was having trouble with knee pain. The patient was noted to be hypertensive. Patient used to be on blood pressure medications, lisinopril HCTZ, but had stopped taking them.  The patient was given prescriptions for benazepril and amlodipine. He went to [facility] to get them filled but they were $40 and he could not afford them. Patient denies any trouble with any chest pain or shortness of breath. He does have a mild headache. He denies any numbness or weakness. No trouble with his gait. No trouble with fevers or chills Past Medical History  Diagnosis Date  . Arthritis   . Hypertension    History reviewed. No pertinent past surgical history. No family history on file. History  Substance Use Topics  . Smoking status: Current Some Day Smoker    Types: Cigarettes  . Smokeless tobacco: Not on file  . Alcohol Use: Yes     Comment: occ    Review of Systems  All other systems reviewed and are negative.     Allergies  Strawberry  Home Medications   Prior to Admission medications   Medication Sig Start Date End Date Taking? Authorizing Provider  lisinopril-hydrochlorothiazide (PRINZIDE,ZESTORETIC) 10-12.5 MG per tablet Take 1 tablet by mouth daily. 08/29/14   Linwood Dibbles, MD   BP 187/122 mmHg  Pulse 82  Temp(Src) 98.3 F (36.8 C) (Oral)  Resp 18  SpO2 99% Physical Exam  Constitutional: He appears well-developed and well-nourished. No distress.  HENT:  Head: Normocephalic and atraumatic.  Right Ear: External ear normal.  Left Ear: External ear normal.  Eyes: Conjunctivae are normal. Right eye exhibits no discharge. Left eye  exhibits no discharge. No scleral icterus.  Neck: Neck supple. No tracheal deviation present.  Cardiovascular: Normal rate, regular rhythm and intact distal pulses.   Pulmonary/Chest: Effort normal and breath sounds normal. No stridor. No respiratory distress. He has no wheezes. He has no rales.  Abdominal: Soft. Bowel sounds are normal. He exhibits no distension. There is no tenderness. There is no rebound and no guarding.  Musculoskeletal: He exhibits no edema or tenderness.  Neurological: He is alert. He has normal strength. No cranial nerve deficit (no facial droop, extraocular movements intact, no slurred speech) or sensory deficit. He exhibits normal muscle tone. He displays no seizure activity. Coordination normal.  Skin: Skin is warm and dry. No rash noted.  Psychiatric: He has a normal mood and affect.  Nursing note and vitals reviewed.   ED Course  Procedures (including critical care time) Labs Review   MDM   Final diagnoses:  Essential hypertension    Patient had his renal function checked yesterday. It was normal. The patient used to take lisinopril hydrochlorothiazide. I checked that medication and it is on the $4 [facility] list. I will give a prescription for that medication and give him a follow-up referral to the [facility]   Linwood Dibbles, MD 08/29/14 [phone]

## 2014-08-29 NOTE — ED Notes (Signed)
Pt was tx here for htn several days ago.  States felt dizzy when he stood up today.  Did not have prescriptions filled b/c he does not have money.  Needs social work consult.

## 2014-09-21 ENCOUNTER — Emergency Department (HOSPITAL_COMMUNITY): Payer: Self-pay

## 2014-09-21 ENCOUNTER — Other Ambulatory Visit (HOSPITAL_COMMUNITY): Payer: Self-pay

## 2014-09-21 ENCOUNTER — Inpatient Hospital Stay (HOSPITAL_COMMUNITY)
Admission: EM | Admit: 2014-09-21 | Discharge: 2014-09-23 | DRG: 305 | Disposition: A | Discharge status: Home / Self Care | Payer: Self-pay | Attending: Internal Medicine | Admitting: Internal Medicine

## 2014-09-21 ENCOUNTER — Encounter (HOSPITAL_COMMUNITY): Payer: Self-pay | Admitting: Emergency Medicine

## 2014-09-21 DIAGNOSIS — I160 Hypertensive urgency: Secondary | ICD-10-CM | POA: Diagnosis present

## 2014-09-21 DIAGNOSIS — Z9119 Patient's noncompliance with other medical treatment and regimen: Secondary | ICD-10-CM | POA: Diagnosis present

## 2014-09-21 DIAGNOSIS — I161 Hypertensive emergency: Secondary | ICD-10-CM | POA: Diagnosis present

## 2014-09-21 DIAGNOSIS — I10 Essential (primary) hypertension: Secondary | ICD-10-CM | POA: Diagnosis present

## 2014-09-21 DIAGNOSIS — R55 Syncope and collapse: Secondary | ICD-10-CM | POA: Diagnosis present

## 2014-09-21 DIAGNOSIS — I248 Other forms of acute ischemic heart disease: Secondary | ICD-10-CM | POA: Diagnosis present

## 2014-09-21 DIAGNOSIS — F1490 Cocaine use, unspecified, uncomplicated: Secondary | ICD-10-CM | POA: Diagnosis present

## 2014-09-21 DIAGNOSIS — R7989 Other specified abnormal findings of blood chemistry: Secondary | ICD-10-CM | POA: Diagnosis present

## 2014-09-21 DIAGNOSIS — I429 Cardiomyopathy, unspecified: Secondary | ICD-10-CM

## 2014-09-21 DIAGNOSIS — F17210 Nicotine dependence, cigarettes, uncomplicated: Secondary | ICD-10-CM | POA: Diagnosis present

## 2014-09-21 DIAGNOSIS — Z9114 Patient's other noncompliance with medication regimen: Secondary | ICD-10-CM | POA: Diagnosis present

## 2014-09-21 DIAGNOSIS — F1410 Cocaine abuse, uncomplicated: Secondary | ICD-10-CM | POA: Diagnosis present

## 2014-09-21 DIAGNOSIS — R778 Other specified abnormalities of plasma proteins: Secondary | ICD-10-CM | POA: Diagnosis present

## 2014-09-21 DIAGNOSIS — I119 Hypertensive heart disease without heart failure: Principal | ICD-10-CM | POA: Insufficient documentation

## 2014-09-21 DIAGNOSIS — I493 Ventricular premature depolarization: Secondary | ICD-10-CM | POA: Diagnosis present

## 2014-09-21 DIAGNOSIS — N179 Acute kidney failure, unspecified: Secondary | ICD-10-CM | POA: Diagnosis present

## 2014-09-21 DIAGNOSIS — M1990 Unspecified osteoarthritis, unspecified site: Secondary | ICD-10-CM | POA: Diagnosis present

## 2014-09-21 DIAGNOSIS — I43 Cardiomyopathy in diseases classified elsewhere: Secondary | ICD-10-CM | POA: Diagnosis present

## 2014-09-21 HISTORY — DX: Cardiomyopathy, unspecified: I42.9

## 2014-09-21 LAB — RAPID URINE DRUG SCREEN, HOSP PERFORMED
Amphetamines: NOT DETECTED
Barbiturates: NOT DETECTED
Benzodiazepines: NOT DETECTED
COCAINE: POSITIVE — AB
Opiates: NOT DETECTED
TETRAHYDROCANNABINOL: POSITIVE — AB

## 2014-09-21 LAB — BASIC METABOLIC PANEL
ANION GAP: 9 (ref 5–15)
BUN: 16 mg/dL (ref 6–20)
CHLORIDE: 107 mmol/L (ref 101–111)
CO2: 24 mmol/L (ref 22–32)
CREATININE: 1.38 mg/dL — AB (ref 0.61–1.24)
Calcium: 9 mg/dL (ref 8.9–10.3)
GFR calc non Af Amer: >60 mL/min (ref >60)
Glucose, Bld: 108 mg/dL — ABNORMAL HIGH (ref 65–99)
Potassium: 3.8 mmol/L (ref 3.5–5.1)
SODIUM: 140 mmol/L (ref 135–145)

## 2014-09-21 LAB — URINALYSIS, ROUTINE W REFLEX MICROSCOPIC
Bilirubin Urine: NEGATIVE
Glucose, UA: NEGATIVE mg/dL
Hgb urine dipstick: NEGATIVE
KETONES UR: NEGATIVE mg/dL
Leukocytes, UA: NEGATIVE
Nitrite: NEGATIVE
Protein, ur: NEGATIVE mg/dL
Specific Gravity, Urine: 1.029 (ref 1.005–1.030)
UROBILINOGEN UA: 0.2 mg/dL (ref 0.0–1.0)
pH: 5 (ref 5.0–8.0)

## 2014-09-21 LAB — TROPONIN I
TROPONIN I: 0.05 ng/mL — AB (ref <0.031)
Troponin I: 0.04 ng/mL — ABNORMAL HIGH (ref <0.031)
Troponin I: 0.05 ng/mL — ABNORMAL HIGH (ref <0.031)

## 2014-09-21 LAB — CBC
HEMATOCRIT: 46.4 % (ref 39.0–52.0)
HEMOGLOBIN: 15.5 g/dL (ref 13.0–17.0)
MCH: 29.9 pg (ref 26.0–34.0)
MCHC: 33.4 g/dL (ref 30.0–36.0)
MCV: 89.4 fL (ref 78.0–100.0)
Platelets: 260 10*3/uL (ref 150–400)
RBC: 5.19 MIL/uL (ref 4.22–5.81)
RDW: 14 % (ref 11.5–15.5)
WBC: 9.8 10*3/uL (ref 4.0–10.5)

## 2014-09-21 LAB — BRAIN NATRIURETIC PEPTIDE: B Natriuretic Peptide: 149.6 pg/mL — ABNORMAL HIGH (ref 0.0–100.0)

## 2014-09-21 IMAGING — CT CT HEAD W/O CM
2 series · 16 of 30 positions shown, 18 images · non-contrast
Comparison: None.

CLINICAL DATA: Patient reports syncopal episode on [REDACTED] with head
injury. Cut to the right eye. Severe headache. Dizziness.

EXAM:
CT HEAD WITHOUT CONTRAST
TECHNIQUE: Contiguous axial images were obtained from the base of the skull
through the vertex without intravenous contrast.

[Series 201: head w/o, idose (1) · axial · non-contrast · 0.49mm/px · z∈[+103,+223]mm · 8 of 32 slices shown, 10 images]
[im 4/32  brain]
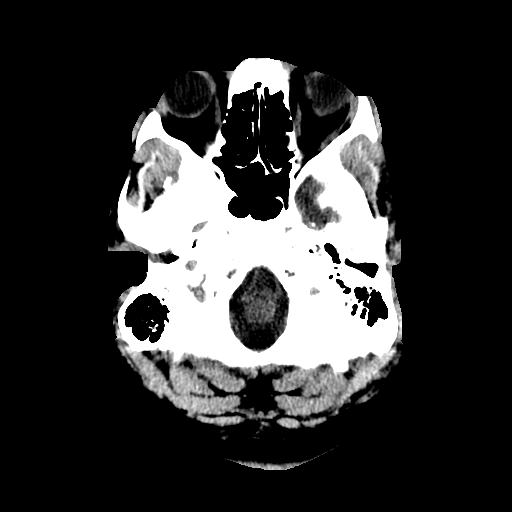
[im 4/32  bone]
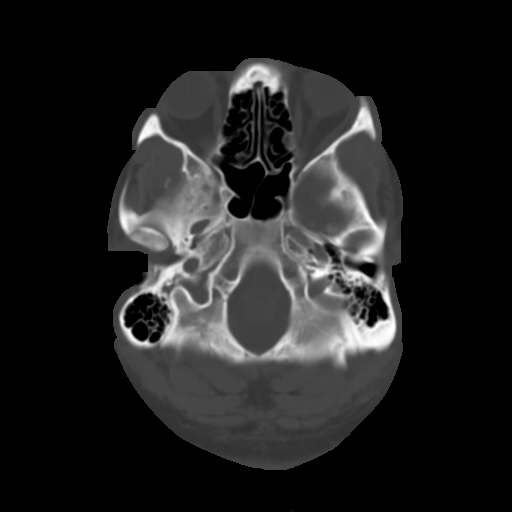
[im 7/32  brain]
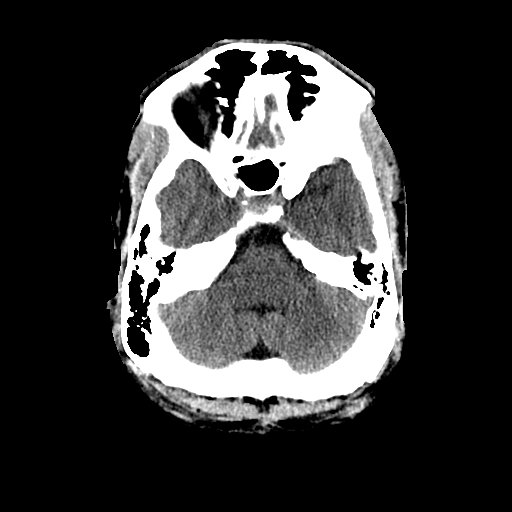
[im 11/32  brain]
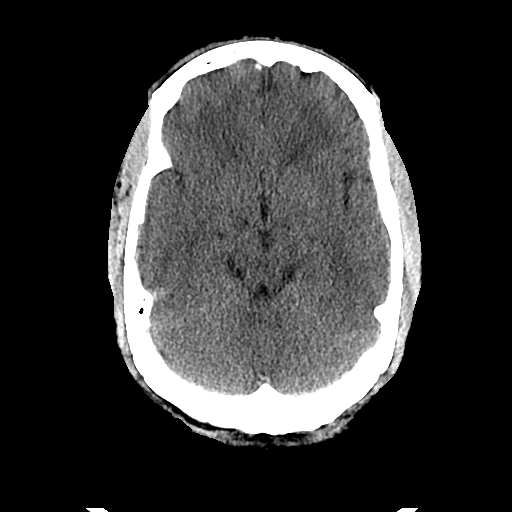
[im 14/32  brain]
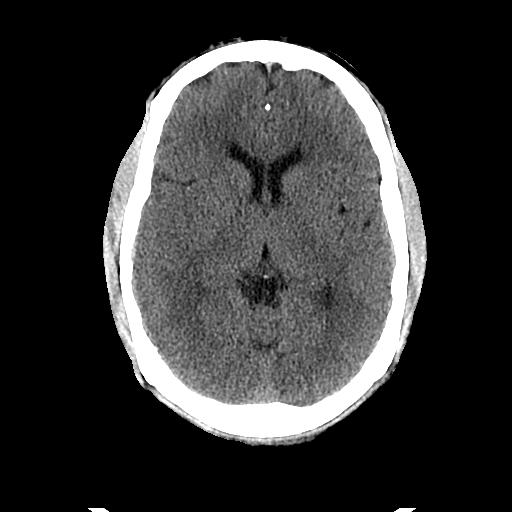
[im 18/32  brain]
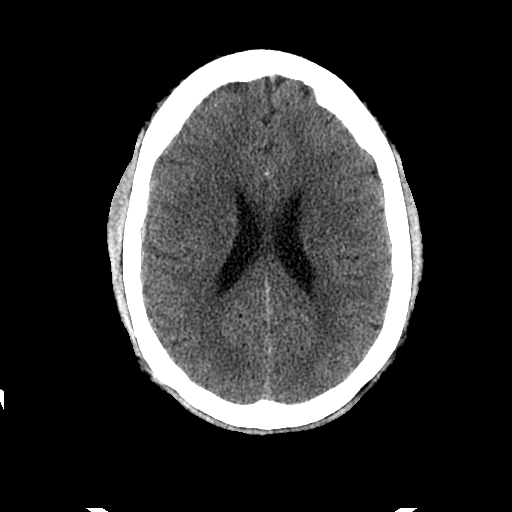
[im 18/32  bone]
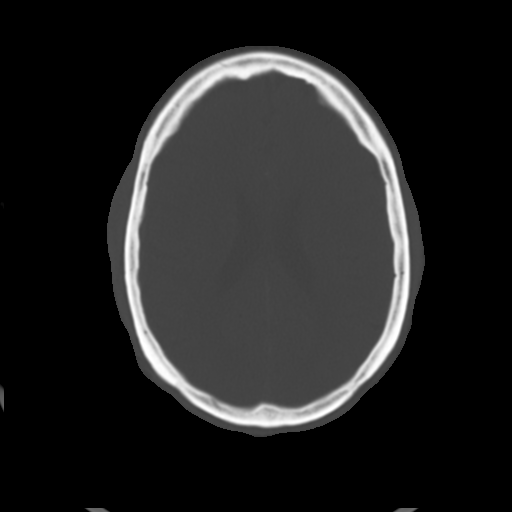
[im 21/32  brain]
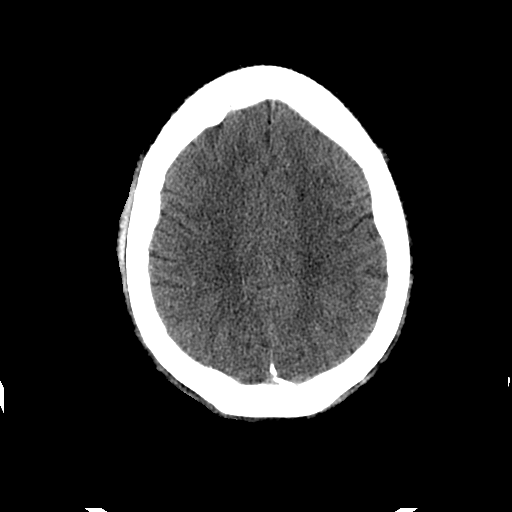
[im 25/32  brain]
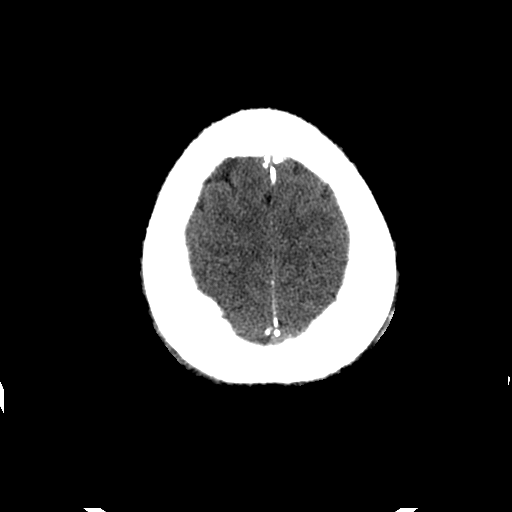
[im 28/32  brain]
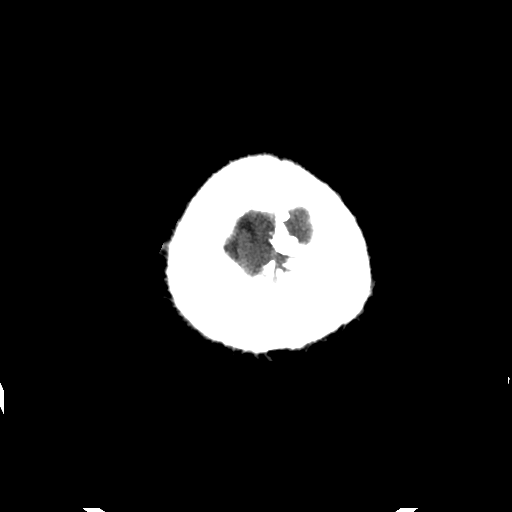

[Series 202: head w/o bone, idose (1) · axial · non-contrast · 0.49mm/px · z∈[+101,+226]mm · 8 of 64 slices shown]
[im 7/64  bone]
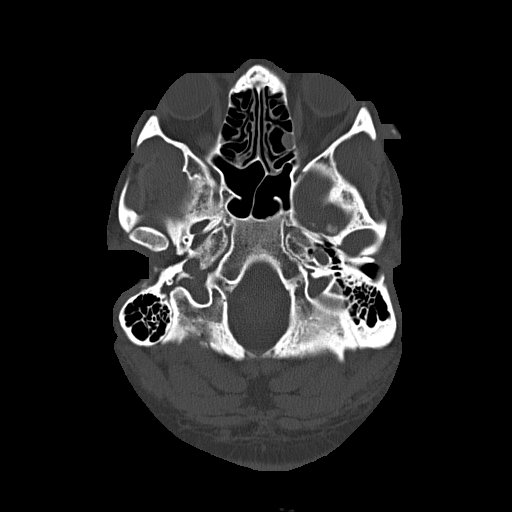
[im 14/64  bone]
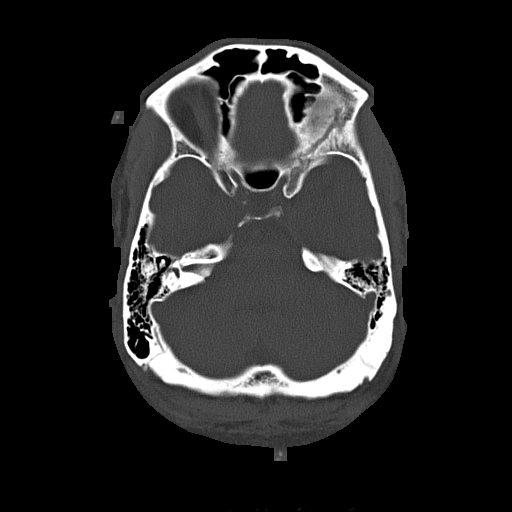
[im 20/64  bone]
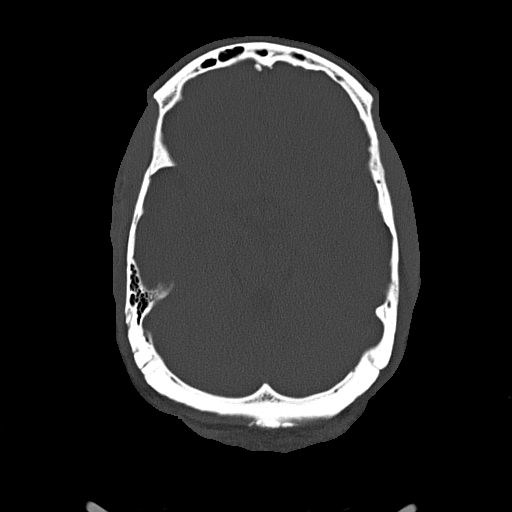
[im 27/64  bone]
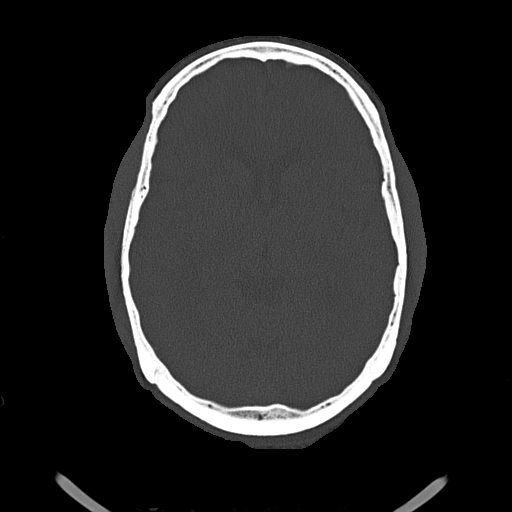
[im 37/64  bone]
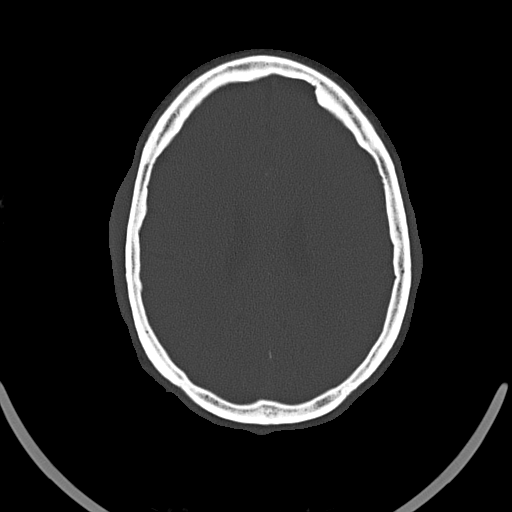
[im 44/64  bone]
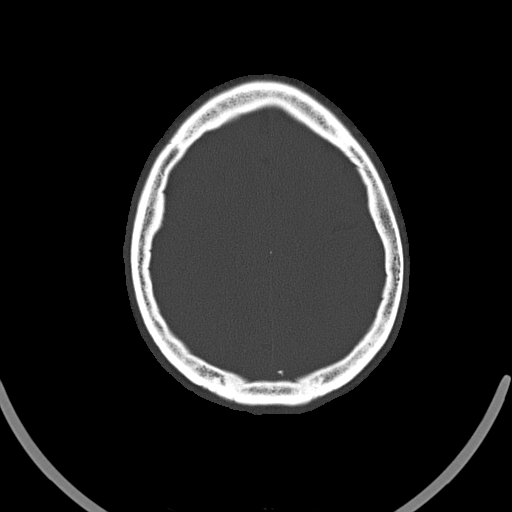
[im 50/64  bone]
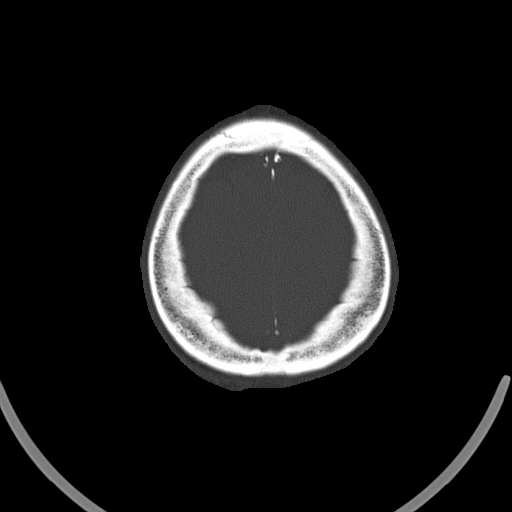
[im 57/64  bone]
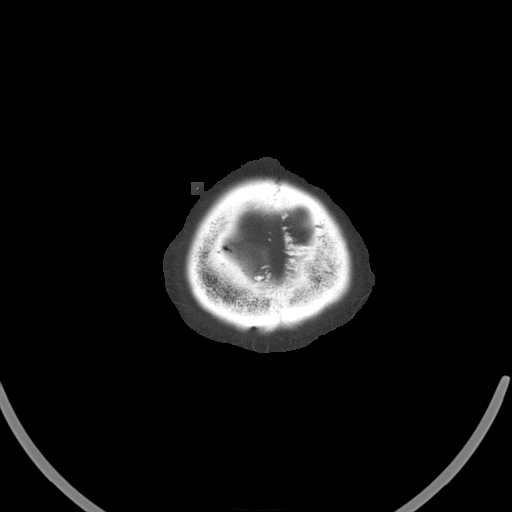

[16 of 30 positions shown; findings below may reference images not displayed]

FINDINGS: Ventricles and sulci are appropriate for patient's age. No evidence
for acute cortically based infarct, intracranial hemorrhage, mass
lesion or mass effect. Orbits are unremarkable. Paranasal sinuses
are grossly unremarkable. Mild mucosal thickening ethmoid air cells.
Mastoid air cells are well aerated. Calvarium is intact.
IMPRESSION: No acute intracranial process.

## 2014-09-21 MED ORDER — ACETAMINOPHEN 325 MG PO TABS
650.0000 mg | ORAL_TABLET | Freq: Four times a day (QID) | ORAL | Status: DC | PRN
  Administered 2014-09-21: 650 mg via ORAL
  Filled 2014-09-21 (×2): qty 2

## 2014-09-21 MED ORDER — ENOXAPARIN SODIUM 40 MG/0.4ML ~~LOC~~ SOLN
40.0000 mg | SUBCUTANEOUS | Status: DC
  Administered 2014-09-21 – 2014-09-22 (×2): 40 mg via SUBCUTANEOUS
  Filled 2014-09-21 (×3): qty 0.4

## 2014-09-21 MED ORDER — FENTANYL CITRATE (PF) 100 MCG/2ML IJ SOLN
50.0000 ug | Freq: Once | INTRAMUSCULAR | Status: AC
  Administered 2014-09-21: 50 ug via INTRAVENOUS
  Filled 2014-09-21: qty 2

## 2014-09-21 MED ORDER — ACETAMINOPHEN 650 MG RE SUPP: 650.0000 mg | Freq: Four times a day (QID) | RECTAL | Status: DC | PRN

## 2014-09-21 MED ORDER — HYDROCODONE-ACETAMINOPHEN 5-325 MG PO TABS: 1.0000 | ORAL_TABLET | ORAL | Status: DC | PRN

## 2014-09-21 MED ORDER — LABETALOL HCL 5 MG/ML IV SOLN
20.0000 mg | Freq: Once | INTRAVENOUS | Status: AC
  Administered 2014-09-21: 20 mg via INTRAVENOUS
  Filled 2014-09-21: qty 4

## 2014-09-21 MED ORDER — ONDANSETRON HCL 4 MG PO TABS: 4.0000 mg | ORAL_TABLET | Freq: Four times a day (QID) | ORAL | Status: DC | PRN

## 2014-09-21 MED ORDER — ONDANSETRON HCL 4 MG/2ML IJ SOLN: 4.0000 mg | Freq: Four times a day (QID) | INTRAMUSCULAR | Status: DC | PRN

## 2014-09-21 MED ORDER — HYDRALAZINE HCL 20 MG/ML IJ SOLN
10.0000 mg | Freq: Four times a day (QID) | INTRAMUSCULAR | Status: DC | PRN
  Administered 2014-09-21: 10 mg via INTRAVENOUS
  Administered 2014-09-23: 5 mg via INTRAVENOUS
  Filled 2014-09-21: qty 1

## 2014-09-21 MED ORDER — SODIUM CHLORIDE 0.9 % IJ SOLN
3.0000 mL | Freq: Two times a day (BID) | INTRAMUSCULAR | Status: DC
  Administered 2014-09-21 – 2014-09-22 (×3): 3 mL via INTRAVENOUS

## 2014-09-21 MED ORDER — ASPIRIN EC 81 MG PO TBEC
81.0000 mg | DELAYED_RELEASE_TABLET | Freq: Every day | ORAL | Status: DC
  Administered 2014-09-21 – 2014-09-23 (×3): 81 mg via ORAL
  Filled 2014-09-21 (×3): qty 1

## 2014-09-21 MED ORDER — SODIUM CHLORIDE 0.9 % IV BOLUS (SEPSIS)
1000.0000 mL | Freq: Once | INTRAVENOUS | Status: AC
  Administered 2014-09-21: 1000 mL via INTRAVENOUS

## 2014-09-21 MED ORDER — SODIUM CHLORIDE 0.9 % IV SOLN
INTRAVENOUS | Status: DC
  Administered 2014-09-21: 23:00:00 via INTRAVENOUS

## 2014-09-21 NOTE — H&P (Addendum)
PCP: No PCP Per Patient    Referring provider Jeraldine Loots   Chief Complaint:  syncope  HPI: Ronald Young is a 44 y.o. male   has a past medical history of Arthritis and Hypertension.   Presented with  Patient has history of recurrent poorly treated hypertension due to medical noncompliance secondary to financial issues. Reports in the past he used to be taking lisinopril hydrochlorothiazide but stopped taking them. He was evaluated in emergency department few weeks ago and was prescribed benazepril and  Amlodipine but states could not afford them from [place]. Last time patient was seen in April 29 and again in emergency department for significant hypertension at that time he was prescribed lisinopril hydrochlorothiazide. He was able to fil the prescription but have been forgetting to take it for the past 1-2 weeks\.    2 days ago patient report having Syncopal episode hitting his head.  Patient was feeling hot stood up and felt lightheaded tried to walk outside. He was outside for about a minute and fainted hitting his head. No urine or bowel incontinence. He vomited. Patient continued to feel lightheaded and remained sitting for another 45 min. Until fe felt better. The next time he tried to standing he felt lightheaded again. This resolved with sitting. Reports poor PO intake.  Patient had persistent headache ever since as well as lightheadedness.  He presented to emergency department initially was found to have blood pressure 200/107 which was treated with labetalol with good results now blood pressure down to 155/115 CT of the head was unremarkable. Patient denies any chest pain in emergency department was noted to have elevated troponin at 0.0 4 repeat troponin was at the same level of 0.05. Reports improvement in his headache and lightheadedness since his blood pressure improved.   Hospitalist was called for admission for hypertensive urgency secondary to medical noncompliance secondary  to financial difficulties.   Review of Systems:    Pertinent positives include: dizziness, headaches, syncope  Constitutional:  No weight loss, night sweats, Fevers, chills, fatigue, weight loss  HEENT:  No , Difficulty swallowing,Tooth/dental problems,Sore throat,  No sneezing, itching, ear ache, nasal congestion, post nasal drip,  Cardio-vascular:  No chest pain, Orthopnea, PND, anasarca,  palpitations.no Bilateral lower extremity swelling  GI:  No heartburn, indigestion, abdominal pain, nausea, vomiting, diarrhea, change in bowel habits, loss of appetite, melena, blood in stool, hematemesis Resp:  no shortness of breath at rest. No dyspnea on exertion, No excess mucus, no productive cough, No non-productive cough, No coughing up of blood.No change in color of mucus.No wheezing. Skin:  no rash or lesions. No jaundice GU:  no dysuria, change in color of urine, no urgency or frequency. No straining to urinate.  No flank pain.  Musculoskeletal:  No joint pain or no joint swelling. No decreased range of motion. No back pain.  Psych:  No change in mood or affect. No depression or anxiety. No memory loss.  Neuro: no localizing neurological complaints, no tingling, no weakness, no double vision, no gait abnormality, no slurred speech, no confusion  Otherwise ROS are negative except for above, 10 systems were reviewed  Past Medical History: Past Medical History  Diagnosis Date  . Arthritis   . Hypertension    History reviewed. No pertinent past surgical history.   Medications: Prior to Admission medications   Medication Sig Start Date End Date Taking? Authorizing Provider  acetaminophen (TYLENOL) 500 MG tablet Take 1,000 mg by mouth daily as needed (pain).  Yes Historical Provider, MD  lisinopril-hydrochlorothiazide (PRINZIDE,ZESTORETIC) 10-12.5 MG per tablet Take 1 tablet by mouth daily. Patient taking differently: Take 1 tablet by mouth at bedtime.  08/29/14  Yes Linwood Dibbles, MD     Allergies:   Allergies  Allergen Reactions  . Strawberry Hives    Social History:  Ambulatory   Independently  Lives at home    With family Patient is a Chef     reports that he has been smoking Cigarettes.  He does not have any smokeless tobacco history on file. He reports that he drinks alcohol. He reports that he does not use illicit drugs.    Family History: family history includes Colon cancer in his other; Hypertension in his mother.    Physical Exam: Patient Vitals for the past 24 hrs:  BP Temp Temp src Pulse Resp SpO2 Height Weight  09/21/14 2039 (!) 155/115 mmHg - - 80 23 99 % - -  09/21/14 2037 176/89 mmHg - - 78 24 99 % - -  09/21/14 2015 176/89 mmHg - - (!) 25 20 100 % - -  09/21/14 2000 166/94 mmHg - - (!) 40 20 96 % - -  09/21/14 1945 (!) 189/134 mmHg - - (!) 29 (!) 31 99 % - -  09/21/14 1930 (!) 193/113 mmHg - - (!) 29 18 100 % - -  09/21/14 1915 (!) 173/127 mmHg - - 85 20 98 % - -  09/21/14 1900 (!) 195/110 mmHg - - 92 20 100 % - -  09/21/14 1845 184/100 mmHg - - - 23 96 % - -  09/21/14 1836 (!) 195/110 mmHg - - 87 26 98 % - -  09/21/14 1607 183/91 mmHg - - 81 (!) 29 96 % - -  09/21/14 1545 (!) 200/107 mmHg - - 85 - 99 % - -  09/21/14 1508 163/95 mmHg - - - - - - -  09/21/14 1505 - 98.8 F (37.1 C) Oral 62 18 100 % 6' (1.829 m) 122.471 kg (270 lb)    1. General:  in No Acute distress 2. Psychological: Alert and  Oriented 3. Head/ENT:   Moist  Mucous Membranes                          Head Non traumatic, neck supple                          Normal   Dentition 4. SKIN: normal  Skin turgor,  Skin clean Dry and intact no rash 5. Heart: Regular rate and rhythm no Murmur, Rub or gallop 6. Lungs: Clear to auscultation bilaterally, no wheezes or crackles   7. Abdomen: Soft, non-tender, Non distended 8. Lower extremities: no clubbing, cyanosis, or edema 9. Neurologically thank 5 out of 5 in all 4 extremities cranial nerves II through 12 intact 10. MSK:  Normal range of motion  body mass index is 36.61 kg/(m^2).   Labs on Admission:   Results for orders placed or performed during the hospital encounter of 09/21/14 (from the past 24 hour(s))  Basic metabolic panel     Status: Abnormal   Collection Time: 09/21/14  3:23 PM  Result Value Ref Range   Sodium 140 135 - 145 mmol/L   Potassium 3.8 3.5 - 5.1 mmol/L   Chloride 107 101 - 111 mmol/L   CO2 24 22 - 32 mmol/L   Glucose, Bld 108 (H) 65 -  99 mg/dL   BUN 16 6 - 20 mg/dL   Creatinine, Ser 6.96 (H) 0.61 - 1.24 mg/dL   Calcium 9.0 8.9 - 29.5 mg/dL   GFR calc non Af Amer >60 >60 mL/min   GFR calc Af Amer >60 >60 mL/min   Anion gap 9 5 - 15  CBC     Status: None   Collection Time: 09/21/14  3:23 PM  Result Value Ref Range   WBC 9.8 4.0 - 10.5 K/uL   RBC 5.19 4.22 - 5.81 MIL/uL   Hemoglobin 15.5 13.0 - 17.0 g/dL   HCT 28.4 13.2 - 44.0 %   MCV 89.4 78.0 - 100.0 fL   MCH 29.9 26.0 - 34.0 pg   MCHC 33.4 30.0 - 36.0 g/dL   RDW 10.2 72.5 - 36.6 %   Platelets 260 150 - 400 K/uL  Troponin I     Status: Abnormal   Collection Time: 09/21/14  4:35 PM  Result Value Ref Range   Troponin I 0.04 (H) <0.031 ng/mL  Urinalysis, Routine w reflex microscopic     Status: None   Collection Time: 09/21/14  4:37 PM  Result Value Ref Range   Color, Urine YELLOW YELLOW   APPearance CLEAR CLEAR   Specific Gravity, Urine 1.029 1.005 - 1.030   pH 5.0 5.0 - 8.0   Glucose, UA NEGATIVE NEGATIVE mg/dL   Hgb urine dipstick NEGATIVE NEGATIVE   Bilirubin Urine NEGATIVE NEGATIVE   Ketones, ur NEGATIVE NEGATIVE mg/dL   Protein, ur NEGATIVE NEGATIVE mg/dL   Urobilinogen, UA 0.2 0.0 - 1.0 mg/dL   Nitrite NEGATIVE NEGATIVE   Leukocytes, UA NEGATIVE NEGATIVE  Brain natriuretic peptide     Status: Abnormal   Collection Time: 09/21/14  6:45 PM  Result Value Ref Range   B Natriuretic Peptide 149.6 (H) 0.0 - 100.0 pg/mL  Troponin I     Status: Abnormal   Collection Time: 09/21/14  6:45 PM  Result Value Ref  Range   Troponin I 0.05 (H) <0.031 ng/mL    UA unremarkable no evidence of proteinuria  Lab Results  Component Value Date   HGBA1C  01/27/2009    6.1 (NOTE) The ADA recommends the following therapeutic goal for glycemic control related to Hgb A1c measurement: Goal of therapy: <6.5 Hgb A1c  Reference: American Diabetes Association: Clinical Practice Recommendations 2010, Diabetes Care, 2010, 33: (Suppl  1).    Estimated Creatinine Clearance: 94.3 mL/min (by C-G formula based on Cr of 1.38).  BNP (last 3 results) No results for input(s): PROBNP in the last 8760 hours.  Other results:  I have pearsonaly reviewed this: ECG REPORT  Rate:74  Rhythm: Sinus rhythm with PVCs ST&T Change: Possibly early re-polarization abnormality, ST depressions in lateral leads was compared to EKG in April QTC 424 Repeat ECG worrisome for Bigeminy with frequent PVC's persistent EKG changes worrisome for repolarization abnormality and hypertrophy  Filed Weights   09/21/14 1505  Weight: 122.471 kg (270 lb)     Cultures:    Component Value Date/Time   SDES URINE, RANDOM 01/01/2007 1130   SPECREQUEST NONE 01/01/2007 1130   CULT NO GROWTH 01/01/2007 1130   REPTSTATUS 01/03/2007 FINAL 01/01/2007 1130     Radiological Exams on Admission: Ct Head Wo Contrast  09/21/2014   CLINICAL DATA:  Patient reports syncopal episode on Friday with head injury. Cut to the right eye. Severe headache. Dizziness.  EXAM: CT HEAD WITHOUT CONTRAST  TECHNIQUE: Contiguous axial images were obtained from the  base of the skull through the vertex without intravenous contrast.  COMPARISON:  None.  FINDINGS: Ventricles and sulci are appropriate for patient's age. No evidence for acute cortically based infarct, intracranial hemorrhage, mass lesion or mass effect. Orbits are unremarkable. Paranasal sinuses are grossly unremarkable. Mild mucosal thickening ethmoid air cells. Mastoid air cells are well aerated. Calvarium is intact.   IMPRESSION: No acute intracranial process.   Electronically Signed   By: Annia Belt M.D.   On: 09/21/2014 17:20    Chart has been reviewed  Family   at  Bedside  plan of care was discussed with Mother Zollie Herriott [phone]  Assessment/Plan  44 year old gentleman history of malignant hypertension poorly controlled secondary to medical noncompliance presents with syncope headache was found to have elevated troponin and creatinine blood pressure now improved after administration of labetalol.  Present on Admission:  . Hypertensive urgency - evidence of end organ damage with elevated troponin and creatinine will admit to telemetry currently blood pressure down to 135/100. Continue to cycle cardiac enzymes obtain echogram. Obtain drug screen. Persistent hypertension will obtain renal vascular ultrasound to evaluate for renal stenosis. Patient's creatinine rose and his Sinemet administration of ACE inhibitor. We will hold ACE inhibitor for now. Given increased PVCs will initiate beta-blocker . Syncope - in the setting of poorly treated hypertension. Will obtain echogram carotid Dopplers monitor on telemetry patient has frequent PVCs which is worrisome. Pending results of a gram-positive would benefit from cardiology follow-up.  . ARF (acute renal failure) in the setting of initiation of ACE inhibitor. And poor by mouth intake. Currently does not appear to be fluid overloaded, endorses poor by mouth intake. check orthostatics and give a trial gentle IV fluids. obtain urine electrolytes Head injury. CT scan of head negative no evidence of intracranial bleeding Mildly elevated troponin - patient is chest pain-free. EKG 474 early repolarization hypertrophy. Continue to cycle cardiac enzymes. Elevated troponin likely secondary to hypertensive urgency. If continues to trend up will need cardiac consult  Prophylaxis:  Lovenox   CODE STATUS:  FULL CODE  as per patient   Disposition:  To home once  workup is complete and patient is stable  Other plan as per orders.  I have spent a total of 55 min on this admission  Shawanda Sievert 09/21/2014, 8:50 PM  [facility]  Pager [phone]   after 2 AM please page floor coverage PA If 7AM-7PM, please contact the day team taking care of the patient  [email or web address]  Password [email or web address]

## 2014-09-21 NOTE — ED Provider Notes (Signed)
CSN: [id number]     Arrival date & time 09/21/14  1441 History   First MD Initiated Contact with Patient 09/21/14 1605     Chief Complaint  Patient presents with  . Loss of Consciousness  . Head Injury     (Consider location/radiation/quality/duration/timing/severity/associated sxs/prior Treatment) HPI Patient presents today as after an episode of syncope with fall, now with concern of ongoing head pain, mild dizziness. Patient has a history of hypertension, but states he has been taking all medication regularly, denies other recent medical issues. Today's the patient had an episode of prodromal syncope. He recalls feeling hot, flushed, dizzy, went outside, and subsequently fell to the ground. Loss of consciousness was brief, but the patient awoke with headache, no chest pain, and with lacerations to his right forehead, right lip. Since that time there's been pain in the right anterior lateral head, with mild ongoing dizziness. No asymmetric weakness of the extremities, no chest pain, no dyspnea. No nausea, vomiting, loss of visual capacity. No relief with anything. Pain is sore, moderate/severe.  Past Medical History  Diagnosis Date  . Arthritis   . Hypertension    History reviewed. No pertinent past surgical history. No family history on file. History  Substance Use Topics  . Smoking status: Current Some Day Smoker    Types: Cigarettes  . Smokeless tobacco: Not on file  . Alcohol Use: Yes     Comment: occ    Review of Systems  Constitutional:       Per HPI, otherwise negative  HENT:       Per HPI, otherwise negative  Respiratory:       Per HPI, otherwise negative  Cardiovascular:       Per HPI, otherwise negative  Gastrointestinal: Negative for vomiting.  Endocrine:       Negative aside from HPI  Genitourinary:       Neg aside from HPI   Musculoskeletal:       Per HPI, otherwise negative  Skin: Positive for wound.  Neurological: Positive for syncope.       Allergies  Strawberry  Home Medications   Prior to Admission medications   Medication Sig Start Date End Date Taking? Authorizing Provider  acetaminophen (TYLENOL) 500 MG tablet Take 1,000 mg by mouth daily as needed (pain).   Yes Historical Provider, MD  lisinopril-hydrochlorothiazide (PRINZIDE,ZESTORETIC) 10-12.5 MG per tablet Take 1 tablet by mouth daily. Patient taking differently: Take 1 tablet by mouth at bedtime.  08/29/14  Yes Linwood Dibbles, MD   BP 193/113 mmHg  Pulse 29  Temp(Src) 98.8 F (37.1 C) (Oral)  Resp 18  Ht 6' (1.829 m)  Wt 270 lb (122.471 kg)  BMI 36.61 kg/m2  SpO2 100% Physical Exam  Constitutional: He is oriented to person, place, and time. He appears well-developed. No distress.  HENT:  Head: Normocephalic and atraumatic.    Eyes: Conjunctivae and EOM are normal.  Neck: Neck supple. No spinous process tenderness and no muscular tenderness present. No rigidity. No edema, no erythema and normal range of motion present. No thyromegaly present.  Cardiovascular: Normal rate and regular rhythm.   Pulmonary/Chest: Effort normal. No stridor. No respiratory distress.  Abdominal: He exhibits no distension.  Musculoskeletal: He exhibits no edema.  Neurological: He is alert and oriented to person, place, and time. He displays no atrophy and no tremor. No cranial nerve deficit or sensory deficit. He exhibits normal muscle tone. He displays no seizure activity. Coordination normal.  Skin: Skin is warm  and dry.  Psychiatric: He has a normal mood and affect.  Nursing note and vitals reviewed.   ED Course  Procedures (including critical care time) Labs Review Labs Reviewed  BASIC METABOLIC PANEL - Abnormal; Notable for the following:    Glucose, Bld 108 (*)    Creatinine, Ser 1.38 (*)    All other components within normal limits  TROPONIN I - Abnormal; Notable for the following:    Troponin I 0.04 (*)    All other components within normal limits  BRAIN  NATRIURETIC PEPTIDE - Abnormal; Notable for the following:    B Natriuretic Peptide 149.6 (*)    All other components within normal limits  TROPONIN I - Abnormal; Notable for the following:    Troponin I 0.05 (*)    All other components within normal limits  CBC  URINALYSIS, ROUTINE W REFLEX MICROSCOPIC    Imaging Review Ct Head Wo Contrast  09/21/2014   CLINICAL DATA:  Patient reports syncopal episode on Friday with head injury. Cut to the right eye. Severe headache. Dizziness.  EXAM: CT HEAD WITHOUT CONTRAST  TECHNIQUE: Contiguous axial images were obtained from the base of the skull through the vertex without intravenous contrast.  COMPARISON:  None.  FINDINGS: Ventricles and sulci are appropriate for patient's age. No evidence for acute cortically based infarct, intracranial hemorrhage, mass lesion or mass effect. Orbits are unremarkable. Paranasal sinuses are grossly unremarkable. Mild mucosal thickening ethmoid air cells. Mastoid air cells are well aerated. Calvarium is intact.  IMPRESSION: No acute intracranial process.   Electronically Signed   By: Annia Belt M.D.   On: 09/21/2014 17:20     EKG Interpretation   Date/Time:  Sunday Sep 21 2014 15:04:39 EDT Ventricular Rate:  74 PR Interval:  172 QRS Duration: 90 QT Interval:  382 QTC Calculation: 424 R Axis:   60 Text Interpretation:  Sinus rhythm with Premature supraventricular  complexes Left ventricular hypertrophy with repolarization abnormality  Abnormal ECG Sinus rhythm Premature atrial complexes Left ventricular  hypertrophy T wave abnormality Abnormal ekg Confirmed by Gerhard Munch   MD ([id number]) on 09/21/2014 4:28:01 PM     O2- 99%ra, nml Cardiac: 80 - sr w multiple PAC, borderline.  Chart review shows multiple visits for HTN / musculoskeletal complaints.  8:31 PM Following several interventions, the patient's headache is decreased. He remains hypertensive, labetalol has been provided. Patient's labs notable for  stroke troponin, both abnormal, minimally. Patient also has a mildly elevated BNP. Chart review demonstrates stress test 6 years ago with ejection fraction 50%. On repeat exam the patient has no additional lightheadedness, but does have mild ongoing headache.   MDM   Patient presents today as after an episode of syncope, now with ongoing headache. Patient's evaluation is notable for 2 borderline positive troponin, elevated BNP. EKG is nonischemic, but with evidence for hypertrophy, and there is concern for endorgan effects of his hypertension. Patient's blood pressure did decrease here with labetalol push, but the patient was admitted for further evaluation, management of his hypertensive urgency, syncope.    Gerhard Munch, MD 09/21/14 2032

## 2014-09-21 NOTE — ED Notes (Signed)
Verified with Dr. Therisa Doyne that pt is appropriate for [facility].

## 2014-09-21 NOTE — ED Notes (Signed)
Pt reports had a syncopal episode on Friday with head injury. Pt has cut to right eyebrow. Pt c/o severe headache. Pt reports dizziness upon waking up today and swelling to right eye.

## 2014-09-22 ENCOUNTER — Inpatient Hospital Stay (HOSPITAL_COMMUNITY): Payer: Self-pay

## 2014-09-22 ENCOUNTER — Encounter (HOSPITAL_COMMUNITY): Payer: Self-pay | Admitting: Cardiology

## 2014-09-22 DIAGNOSIS — R7989 Other specified abnormal findings of blood chemistry: Secondary | ICD-10-CM

## 2014-09-22 DIAGNOSIS — R55 Syncope and collapse: Secondary | ICD-10-CM

## 2014-09-22 DIAGNOSIS — I429 Cardiomyopathy, unspecified: Secondary | ICD-10-CM

## 2014-09-22 DIAGNOSIS — I10 Essential (primary) hypertension: Secondary | ICD-10-CM

## 2014-09-22 HISTORY — DX: Cardiomyopathy, unspecified: I42.9

## 2014-09-22 LAB — LIPID PANEL
CHOL/HDL RATIO: 4.9 ratio
Cholesterol: 180 mg/dL (ref 0–200)
HDL: 37 mg/dL — AB (ref >40)
LDL CALC: 119 mg/dL — AB (ref 0–99)
Triglycerides: 122 mg/dL (ref <150)
VLDL: 24 mg/dL (ref 0–40)

## 2014-09-22 LAB — COMPREHENSIVE METABOLIC PANEL
ALT: 25 U/L (ref 17–63)
AST: 25 U/L (ref 15–41)
Albumin: 3.1 g/dL — ABNORMAL LOW (ref 3.5–5.0)
Alkaline Phosphatase: 71 U/L (ref 38–126)
Anion gap: 8 (ref 5–15)
BUN: 12 mg/dL (ref 6–20)
CALCIUM: 8.8 mg/dL — AB (ref 8.9–10.3)
CHLORIDE: 105 mmol/L (ref 101–111)
CO2: 26 mmol/L (ref 22–32)
CREATININE: 1.15 mg/dL (ref 0.61–1.24)
GFR calc Af Amer: >60 mL/min (ref >60)
Glucose, Bld: 106 mg/dL — ABNORMAL HIGH (ref 65–99)
Potassium: 3.3 mmol/L — ABNORMAL LOW (ref 3.5–5.1)
Sodium: 139 mmol/L (ref 135–145)
TOTAL PROTEIN: 6.4 g/dL — AB (ref 6.5–8.1)
Total Bilirubin: 0.5 mg/dL (ref 0.3–1.2)

## 2014-09-22 LAB — TROPONIN I
Troponin I: 0.05 ng/mL — ABNORMAL HIGH (ref <0.031)
Troponin I: 0.04 ng/mL — ABNORMAL HIGH (ref <0.031)

## 2014-09-22 LAB — CBC WITH DIFFERENTIAL/PLATELET
BASOS ABS: 0 10*3/uL (ref 0.0–0.1)
Basophils Relative: 0 % (ref 0–1)
EOS ABS: 0.2 10*3/uL (ref 0.0–0.7)
Eosinophils Relative: 3 % (ref 0–5)
HEMATOCRIT: 41.3 % (ref 39.0–52.0)
Hemoglobin: 13.6 g/dL (ref 13.0–17.0)
LYMPHS PCT: 39 % (ref 12–46)
Lymphs Abs: 3.6 10*3/uL (ref 0.7–4.0)
MCH: 29.4 pg (ref 26.0–34.0)
MCHC: 32.9 g/dL (ref 30.0–36.0)
MCV: 89.2 fL (ref 78.0–100.0)
MONOS PCT: 7 % (ref 3–12)
Monocytes Absolute: 0.6 10*3/uL (ref 0.1–1.0)
NEUTROS ABS: 4.7 10*3/uL (ref 1.7–7.7)
NEUTROS PCT: 51 % (ref 43–77)
PLATELETS: 257 10*3/uL (ref 150–400)
RBC: 4.63 MIL/uL (ref 4.22–5.81)
RDW: 13.9 % (ref 11.5–15.5)
WBC: 9.1 10*3/uL (ref 4.0–10.5)

## 2014-09-22 LAB — CREATININE, URINE, RANDOM: Creatinine, Urine: 171.79 mg/dL

## 2014-09-22 LAB — MAGNESIUM: MAGNESIUM: 1.9 mg/dL (ref 1.7–2.4)

## 2014-09-22 LAB — PHOSPHORUS: Phosphorus: 2.6 mg/dL (ref 2.5–4.6)

## 2014-09-22 LAB — SODIUM, URINE, RANDOM: Sodium, Ur: 135 mmol/L

## 2014-09-22 LAB — TSH: TSH: 1.639 u[IU]/mL (ref 0.350–4.500)

## 2014-09-22 MED ORDER — POTASSIUM CHLORIDE CRYS ER 20 MEQ PO TBCR
20.0000 meq | EXTENDED_RELEASE_TABLET | Freq: Once | ORAL | Status: AC
  Administered 2014-09-22: 20 meq via ORAL
  Filled 2014-09-22: qty 1

## 2014-09-22 MED ORDER — CARVEDILOL 6.25 MG PO TABS
6.2500 mg | ORAL_TABLET | Freq: Two times a day (BID) | ORAL | Status: DC
  Administered 2014-09-22 – 2014-09-23 (×3): 6.25 mg via ORAL
  Filled 2014-09-22 (×4): qty 1

## 2014-09-22 MED ORDER — FUROSEMIDE 20 MG PO TABS
20.0000 mg | ORAL_TABLET | Freq: Every day | ORAL | Status: DC
  Administered 2014-09-22 – 2014-09-23 (×2): 20 mg via ORAL
  Filled 2014-09-22 (×2): qty 1

## 2014-09-22 MED ORDER — METOPROLOL TARTRATE 25 MG PO TABS
25.0000 mg | ORAL_TABLET | Freq: Two times a day (BID) | ORAL | Status: DC
  Administered 2014-09-22 (×2): 25 mg via ORAL
  Filled 2014-09-22 (×3): qty 1

## 2014-09-22 MED ORDER — LISINOPRIL 20 MG PO TABS
20.0000 mg | ORAL_TABLET | Freq: Every day | ORAL | Status: DC
  Administered 2014-09-22 – 2014-09-23 (×2): 20 mg via ORAL
  Filled 2014-09-22 (×2): qty 1

## 2014-09-22 MED ORDER — LABETALOL HCL 5 MG/ML IV SOLN
10.0000 mg | INTRAVENOUS | Status: DC | PRN
  Administered 2014-09-22: 10 mg via INTRAVENOUS
  Filled 2014-09-22 (×2): qty 4

## 2014-09-22 NOTE — Progress Notes (Signed)
*  PRELIMINARY RESULTS* Vascular Ultrasound Carotid Duplex (Doppler) has been completed.  Preliminary findings: Bilateral:  1-39% ICA stenosis.  Vertebral artery flow is antegrade.      Farrel Demark, RDMS, RVT  09/22/2014, 8:50 AM

## 2014-09-22 NOTE — Progress Notes (Addendum)
[facility] PROGRESS NOTE  Herma Carson NLZ:[id number] DOB: 07/21/1970 DOA: 09/21/2014 PCP: No PCP Per Patient  Assessment/Plan: 1. Hypertensive Urgency -improved, due to med non compliance and cocaine use -was started on Metoprolol last pm, not ideal in setting of cocaine, will await ECHO before stopping -add lisinopril -hydralazine PRN -Renal artery duplex ordered by admit her will follow-up  -Case manager consult for help with medications   2. Syncope -likely due to #1 vs cocaine induced arrhythmia -FU ECHO, keep on Tele  3. Mildly elevated troponin -Non-ACS pattern -due to #1 and cocaine use -Denies any chest pain, continue aspirin, follow-up echocardiogram  4. Cocaine positive -Denies this, tells me that he suspects someone mixed it with his cannabis -Counseled  5. Elevated LDL -Recommend lifestyle modification, hold off on starting statin since at risk of rhabdo given concomitant cocaine use  Code Status: Full COde Family Communication: mother at bedside Disposition Plan: Home tomorrow    HPI/Subjective: Feels ok, no more dizziness  Objective: Filed Vitals:   09/22/14 0620  BP: 160/101  Pulse: 79  Temp:   Resp:     Intake/Output Summary (Last 24 hours) at 09/22/14 1202 Last data filed at 09/22/14 0507  Gross per 24 hour  Intake      3 ml  Output    200 ml  Net   -197 ml   Filed Weights   09/21/14 1505  Weight: 122.471 kg (270 lb)    Exam:   General:  AAOx3  Cardiovascular: S1S2/RRR  Respiratory: CTAB  Abdomen: soft, NT, BS present  Musculoskeletal: no edema c/c  Neuro: non focal  Data Reviewed: Basic Metabolic Panel:  Recent Labs Lab 09/21/14 1523 09/22/14 0250  NA 140 139  K 3.8 3.3*  CL 107 105  CO2 24 26  GLUCOSE 108* 106*  BUN 16 12  CREATININE 1.38* 1.15  CALCIUM 9.0 8.8*  MG  --  1.9  PHOS  --  2.6   Liver Function Tests:  Recent Labs Lab 09/22/14 0250  AST 25  ALT 25  ALKPHOS 71  BILITOT 0.5  PROT  6.4*  ALBUMIN 3.1*   No results for input(s): LIPASE, AMYLASE in the last 168 hours. No results for input(s): AMMONIA in the last 168 hours. CBC:  Recent Labs Lab 09/21/14 1523 09/22/14 0250  WBC 9.8 9.1  NEUTROABS  --  4.7  HGB 15.5 13.6  HCT 46.4 41.3  MCV 89.4 89.2  PLT 260 257   Cardiac Enzymes:  Recent Labs Lab 09/21/14 1635 09/21/14 1845 09/21/14 2210 09/22/14 0250 09/22/14 0915  TROPONINI 0.04* 0.05* 0.05* 0.05* 0.04*   BNP (last 3 results)  Recent Labs  09/21/14 1845  BNP 149.6*    ProBNP (last 3 results) No results for input(s): PROBNP in the last 8760 hours.  CBG: No results for input(s): GLUCAP in the last 168 hours.  No results found for this or any previous visit (from the past 240 hour(s)).   Studies: Ct Head Wo Contrast  09/21/2014   CLINICAL DATA:  Patient reports syncopal episode on Friday with head injury. Cut to the right eye. Severe headache. Dizziness.  EXAM: CT HEAD WITHOUT CONTRAST  TECHNIQUE: Contiguous axial images were obtained from the base of the skull through the vertex without intravenous contrast.  COMPARISON:  None.  FINDINGS: Ventricles and sulci are appropriate for patient's age. No evidence for acute cortically based infarct, intracranial hemorrhage, mass lesion or mass effect. Orbits are unremarkable. Paranasal sinuses are grossly unremarkable. Mild  mucosal thickening ethmoid air cells. Mastoid air cells are well aerated. Calvarium is intact.  IMPRESSION: No acute intracranial process.   Electronically Signed   By: Annia Belt M.D.   On: 09/21/2014 17:20    Scheduled Meds: . aspirin EC  81 mg Oral Daily  . enoxaparin (LOVENOX) injection  40 mg Subcutaneous Q24H  . lisinopril  20 mg Oral Daily  . metoprolol tartrate  25 mg Oral BID  . sodium chloride  3 mL Intravenous Q12H   Continuous Infusions:  Antibiotics Given (last 72 hours)    None      Active Problems:   Hypertensive urgency   Syncope   Malignant  hypertension   ARF (acute renal failure)   Elevated troponin    Time spent:    [facility]  [facility] Pager [phone]. If 7PM-7AM, please contact night-coverage at [email or web address], password [facility] 09/22/2014, 12:02 PM  LOS: 1 day

## 2014-09-22 NOTE — Progress Notes (Signed)
*  PRELIMINARY RESULTS* Vascular Ultrasound Renal artery duplex has been completed.  Preliminary findings: no evidence of renal artery stenosis.  Farrel Demark, RDMS, RVT  09/22/2014, 8:46 AM

## 2014-09-22 NOTE — Consult Note (Signed)
Reason for Consult: syncope   Referring Physician: Dr. Broadus John   PCP:  No PCP Per Patient  Primary Cardiologist: previously Dr. Holland Commons Ronald Young is an 44 y.o. male.    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 nuc study 2010 with EF 51% at that time.  Now with admit for syncope, and EF is EF 25-30% with moderate concentric hypertrophy.  G2DD.  Presented with syncope 2 days prior to admit.  Also uncontrolled HTN.  Has difficulty affording meds.   Patient was feeling hot stood up and felt lightheaded tried to walk outside. He was outside for about a minute and fainted hitting his head. No urine or bowel incontinence. He vomited. Patient continued to feel lightheaded and remained sitting for another 45 min. Until he felt better. The next time he tried to standing he felt lightheaded again. This resolved with sitting. Reports poor PO intake. Patient had persistent headache ever since as well as lightheadedness. He presented to emergency department initially was found to have blood pressure 200/107 which was treated with labetalol with good results now blood pressure down to 155/115 CT of the head was unremarkable. Patient denies any chest pain in emergency department was noted to have elevated troponin at 0.0 4 repeat troponin was at the same level of 0.05. Reports improvement in his headache and lightheadedness since his blood pressure improved. No chest pain.   Labs: troponin mildly elevated at 0.05 to 0.04- flat reading.  Cr. Elevated at 1.38  Drug screen + for cocaine and tetrahydrocannabinol.   EKG:  SR with PVCs, LVH.    Renal dopplers normal.  Past Medical History  Diagnosis Date  . Arthritis   . Hypertension   . Cardiomyopathy 09/22/2014    History reviewed. No pertinent past surgical history.  Family History  Problem Relation Age of Onset  . Hypertension Mother   . Colon cancer Other     Social History:  reports that he has been smoking Cigarettes.  He does not have any smokeless tobacco history on file. He reports that he drinks alcohol. He reports that he does not use illicit drugs.  Allergies:  Allergies  Allergen Reactions  . Strawberry Hives    OUTPT MEDS: No current facility-administered medications on file prior to encounter.   Current Outpatient Prescriptions on File Prior to Encounter  Medication Sig Dispense Refill  . lisinopril-hydrochlorothiazide (PRINZIDE,ZESTORETIC) 10-12.5 MG per tablet Take 1 tablet by mouth daily. (Patient taking differently: Take 1 tablet by mouth at bedtime. ) 30 tablet 1  . [DISCONTINUED] amLODipine (NORVASC) 10 MG tablet Take 1 tablet (10 mg total) by mouth daily. (Patient not taking: Reported on 08/29/2014) 30 tablet 0  . [DISCONTINUED] benazepril (LOTENSIN) 10 MG tablet Take 1 tablet (10 mg total) by mouth daily. (Patient not taking: Reported on 08/29/2014) 30 tablet 0    CURRENT MEDS: Scheduled Meds: . aspirin EC  81 mg Oral Daily  . enoxaparin (LOVENOX) injection  40 mg Subcutaneous Q24H  . lisinopril  20 mg Oral Daily  . metoprolol tartrate  25 mg Oral BID  . sodium chloride  3 mL Intravenous Q12H   Continuous Infusions:  PRN Meds:.acetaminophen **OR** acetaminophen, hydrALAZINE, HYDROcodone-acetaminophen, ondansetron **OR** ondansetron (ZOFRAN) IV   Results for orders placed or performed during the hospital encounter of 09/21/14 (from the past 48 hour(s))  Basic  metabolic panel     Status: Abnormal   Collection Time: 09/21/14  3:23 PM  Result Value Ref Range   Sodium 140 135 - 145 mmol/L   Potassium 3.8 3.5 - 5.1 mmol/L   Chloride 107 101 - 111 mmol/L   CO2 24 22 - 32 mmol/L   Glucose, Bld 108 (H) 65 - 99 mg/dL   BUN 16 6 - 20 mg/dL   Creatinine, Ser 1.38 (H) 0.61 - 1.24 mg/dL   Calcium 9.0 8.9 - 10.3 mg/dL   GFR calc non Af Amer >60 >60 mL/min   GFR calc Af Amer >60 >60 mL/min    Comment: (NOTE) The eGFR has  been calculated using the CKD EPI equation. This calculation has not been validated in all clinical situations. eGFR's persistently <60 mL/min signify possible Chronic Kidney Disease.    Anion gap 9 5 - 15  CBC     Status: None   Collection Time: 09/21/14  3:23 PM  Result Value Ref Range   WBC 9.8 4.0 - 10.5 K/uL   RBC 5.19 4.22 - 5.81 MIL/uL   Hemoglobin 15.5 13.0 - 17.0 g/dL   HCT 46.4 39.0 - 52.0 %   MCV 89.4 78.0 - 100.0 fL   MCH 29.9 26.0 - 34.0 pg   MCHC 33.4 30.0 - 36.0 g/dL   RDW 14.0 11.5 - 15.5 %   Platelets 260 150 - 400 K/uL  Troponin I     Status: Abnormal   Collection Time: 09/21/14  4:35 PM  Result Value Ref Range   Troponin I 0.04 (H) <0.031 ng/mL    Comment:        PERSISTENTLY INCREASED TROPONIN VALUES IN THE RANGE OF 0.04-0.49 ng/mL CAN BE SEEN IN:       -UNSTABLE ANGINA       -CONGESTIVE HEART FAILURE       -MYOCARDITIS       -CHEST TRAUMA       -ARRYHTHMIAS       -LATE PRESENTING MYOCARDIAL INFARCTION       -COPD   CLINICAL FOLLOW-UP RECOMMENDED.   Urinalysis, Routine w reflex microscopic     Status: None   Collection Time: 09/21/14  4:37 PM  Result Value Ref Range   Color, Urine YELLOW YELLOW   APPearance CLEAR CLEAR   Specific Gravity, Urine 1.029 1.005 - 1.030   pH 5.0 5.0 - 8.0   Glucose, UA NEGATIVE NEGATIVE mg/dL   Hgb urine dipstick NEGATIVE NEGATIVE   Bilirubin Urine NEGATIVE NEGATIVE   Ketones, ur NEGATIVE NEGATIVE mg/dL   Protein, ur NEGATIVE NEGATIVE mg/dL   Urobilinogen, UA 0.2 0.0 - 1.0 mg/dL   Nitrite NEGATIVE NEGATIVE   Leukocytes, UA NEGATIVE NEGATIVE    Comment: MICROSCOPIC NOT DONE ON URINES WITH NEGATIVE PROTEIN, BLOOD, LEUKOCYTES, NITRITE, OR GLUCOSE <1000 mg/dL.  Urine rapid drug screen (hosp performed)     Status: Abnormal   Collection Time: 09/21/14  4:37 PM  Result Value Ref Range   Opiates NONE DETECTED NONE DETECTED   Cocaine POSITIVE (A) NONE DETECTED   Benzodiazepines NONE DETECTED NONE DETECTED   Amphetamines  NONE DETECTED NONE DETECTED   Tetrahydrocannabinol POSITIVE (A) NONE DETECTED   Barbiturates NONE DETECTED NONE DETECTED    Comment:        DRUG SCREEN FOR MEDICAL PURPOSES ONLY.  IF CONFIRMATION IS NEEDED FOR ANY PURPOSE, NOTIFY LAB WITHIN 5 DAYS.        LOWEST DETECTABLE LIMITS FOR URINE DRUG SCREEN  Drug Class       Cutoff (ng/mL) Amphetamine      1000 Barbiturate      200 Benzodiazepine   003 Tricyclics       704 Opiates          300 Cocaine          300 THC              50   Brain natriuretic peptide     Status: Abnormal   Collection Time: 09/21/14  6:45 PM  Result Value Ref Range   B Natriuretic Peptide 149.6 (H) 0.0 - 100.0 pg/mL  Troponin I     Status: Abnormal   Collection Time: 09/21/14  6:45 PM  Result Value Ref Range   Troponin I 0.05 (H) <0.031 ng/mL    Comment:        PERSISTENTLY INCREASED TROPONIN VALUES IN THE RANGE OF 0.04-0.49 ng/mL CAN BE SEEN IN:       -UNSTABLE ANGINA       -CONGESTIVE HEART FAILURE       -MYOCARDITIS       -CHEST TRAUMA       -ARRYHTHMIAS       -LATE PRESENTING MYOCARDIAL INFARCTION       -COPD   CLINICAL FOLLOW-UP RECOMMENDED.   Troponin I (q 6hr x 3)     Status: Abnormal   Collection Time: 09/21/14 10:10 PM  Result Value Ref Range   Troponin I 0.05 (H) <0.031 ng/mL    Comment:        PERSISTENTLY INCREASED TROPONIN VALUES IN THE RANGE OF 0.04-0.49 ng/mL CAN BE SEEN IN:       -UNSTABLE ANGINA       -CONGESTIVE HEART FAILURE       -MYOCARDITIS       -CHEST TRAUMA       -ARRYHTHMIAS       -LATE PRESENTING MYOCARDIAL INFARCTION       -COPD   CLINICAL FOLLOW-UP RECOMMENDED.   TSH     Status: None   Collection Time: 09/22/14  2:30 AM  Result Value Ref Range   TSH 1.639 0.350 - 4.500 uIU/mL  Troponin I (q 6hr x 3)     Status: Abnormal   Collection Time: 09/22/14  2:50 AM  Result Value Ref Range   Troponin I 0.05 (H) <0.031 ng/mL    Comment:        PERSISTENTLY INCREASED TROPONIN VALUES IN THE RANGE OF  0.04-0.49 ng/mL CAN BE SEEN IN:       -UNSTABLE ANGINA       -CONGESTIVE HEART FAILURE       -MYOCARDITIS       -CHEST TRAUMA       -ARRYHTHMIAS       -LATE PRESENTING MYOCARDIAL INFARCTION       -COPD   CLINICAL FOLLOW-UP RECOMMENDED.   Magnesium     Status: None   Collection Time: 09/22/14  2:50 AM  Result Value Ref Range   Magnesium 1.9 1.7 - 2.4 mg/dL  Phosphorus     Status: None   Collection Time: 09/22/14  2:50 AM  Result Value Ref Range   Phosphorus 2.6 2.5 - 4.6 mg/dL  Comprehensive metabolic panel     Status: Abnormal   Collection Time: 09/22/14  2:50 AM  Result Value Ref Range   Sodium 139 135 - 145 mmol/L   Potassium 3.3 (L) 3.5 - 5.1 mmol/L   Chloride 105 101 -  111 mmol/L   CO2 26 22 - 32 mmol/L   Glucose, Bld 106 (H) 65 - 99 mg/dL   BUN 12 6 - 20 mg/dL   Creatinine, Ser 1.15 0.61 - 1.24 mg/dL   Calcium 8.8 (L) 8.9 - 10.3 mg/dL   Total Protein 6.4 (L) 6.5 - 8.1 g/dL   Albumin 3.1 (L) 3.5 - 5.0 g/dL   AST 25 15 - 41 U/L   ALT 25 17 - 63 U/L   Alkaline Phosphatase 71 38 - 126 U/L   Total Bilirubin 0.5 0.3 - 1.2 mg/dL   GFR calc non Af Amer >60 >60 mL/min   GFR calc Af Amer >60 >60 mL/min    Comment: (NOTE) The eGFR has been calculated using the CKD EPI equation. This calculation has not been validated in all clinical situations. eGFR's persistently <60 mL/min signify possible Chronic Kidney Disease.    Anion gap 8 5 - 15  CBC WITH DIFFERENTIAL     Status: None   Collection Time: 09/22/14  2:50 AM  Result Value Ref Range   WBC 9.1 4.0 - 10.5 K/uL   RBC 4.63 4.22 - 5.81 MIL/uL   Hemoglobin 13.6 13.0 - 17.0 g/dL   HCT 41.3 39.0 - 52.0 %   MCV 89.2 78.0 - 100.0 fL   MCH 29.4 26.0 - 34.0 pg   MCHC 32.9 30.0 - 36.0 g/dL   RDW 13.9 11.5 - 15.5 %   Platelets 257 150 - 400 K/uL   Neutrophils Relative % 51 43 - 77 %   Neutro Abs 4.7 1.7 - 7.7 K/uL   Lymphocytes Relative 39 12 - 46 %   Lymphs Abs 3.6 0.7 - 4.0 K/uL   Monocytes Relative 7 3 - 12 %    Monocytes Absolute 0.6 0.1 - 1.0 K/uL   Eosinophils Relative 3 0 - 5 %   Eosinophils Absolute 0.2 0.0 - 0.7 K/uL   Basophils Relative 0 0 - 1 %   Basophils Absolute 0.0 0.0 - 0.1 K/uL  Lipid panel     Status: Abnormal   Collection Time: 09/22/14  2:50 AM  Result Value Ref Range   Cholesterol 180 0 - 200 mg/dL   Triglycerides 122 <150 mg/dL   HDL 37 (L) >40 mg/dL   Total CHOL/HDL Ratio 4.9 RATIO   VLDL 24 0 - 40 mg/dL   LDL Cholesterol 119 (H) 0 - 99 mg/dL    Comment:        Total Cholesterol/HDL:CHD Risk Coronary Heart Disease Risk Table                     Men   Women  1/2 Average Risk   3.4   3.3  Average Risk       5.0   4.4  2 X Average Risk   9.6   7.1  3 X Average Risk  23.4   11.0        Use the calculated Patient Ratio above and the CHD Risk Table to determine the patient's CHD Risk.        ATP III CLASSIFICATION (LDL):  <100     mg/dL   Optimal  100-129  mg/dL   Near or Above                    Optimal  130-159  mg/dL   Borderline  160-189  mg/dL   High  >190     mg/dL  Very High   Creatinine, urine, random     Status: None   Collection Time: 09/22/14  5:19 AM  Result Value Ref Range   Creatinine, Urine 171.79 mg/dL  Sodium, urine, random     Status: None   Collection Time: 09/22/14  5:19 AM  Result Value Ref Range   Sodium, Ur 135 mmol/L  Troponin I (q 6hr x 3)     Status: Abnormal   Collection Time: 09/22/14  9:15 AM  Result Value Ref Range   Troponin I 0.04 (H) <0.031 ng/mL    Comment:        PERSISTENTLY INCREASED TROPONIN VALUES IN THE RANGE OF 0.04-0.49 ng/mL CAN BE SEEN IN:       -UNSTABLE ANGINA       -CONGESTIVE HEART FAILURE       -MYOCARDITIS       -CHEST TRAUMA       -ARRYHTHMIAS       -LATE PRESENTING MYOCARDIAL INFARCTION       -COPD   CLINICAL FOLLOW-UP RECOMMENDED. SPECIMEN HEMOLYZED. HEMOLYSIS MAY AFFECT INTEGRITY OF RESULTS.    Ct Head Wo Contrast  09/21/2014   CLINICAL DATA:  Patient reports syncopal episode on Friday with  head injury. Cut to the right eye. Severe headache. Dizziness.  EXAM: CT HEAD WITHOUT CONTRAST  TECHNIQUE: Contiguous axial images were obtained from the base of the skull through the vertex without intravenous contrast.  COMPARISON:  None.  FINDINGS: Ventricles and sulci are appropriate for patient's age. No evidence for acute cortically based infarct, intracranial hemorrhage, mass lesion or mass effect. Orbits are unremarkable. Paranasal sinuses are grossly unremarkable. Mild mucosal thickening ethmoid air cells. Mastoid air cells are well aerated. Calvarium is intact.  IMPRESSION: No acute intracranial process.   Electronically Signed   By: Lovey Newcomer M.D.   On: 09/21/2014 17:20   NO recent CXRs  ROS: General:no colds or fevers, no weight changes Skin:no rashes or ulcers HEENT:no blurred vision, no congestion, + headaches CV:see HPI PUL:see HPI GI:no diarrhea constipation or melena, no indigestion GU:no hematuria, no dysuria MS:+ rt knee pain-arthritis, no claudication Neuro:+ syncope, + lightheadedness Endo:no diabetes, no thyroid disease   Blood pressure 160/101, pulse 79, temperature 98.7 F (37.1 C), temperature source Oral, resp. rate 18, height 6' (1.829 m), weight 270 lb (122.471 kg), SpO2 100 %.  Wt Readings from Last 3 Encounters:  09/21/14 270 lb (122.471 kg)    PE: General:Pleasant affect, but tearful, understands he could die if he does not take his meds. NAD Skin:Warm and dry, brisk capillary refill HEENT:normocephalic, sclera clear, mucus membranes moist Neck:supple, no JVD, no bruits  Heart:S1S2 RRR without murmur, gallup, rub or click Lungs:clear without rales, rhonchi, or wheezes ULA:GTXMI,WOEH, non tender, + BS, do not palpate liver spleen or masses Ext:no lower ext edema, 2+ pedal pulses, 2+ radial pulses Neuro:alert and oriented X 3, MAE, follows commands, + facial symmetry  tele:  SR with freq PVCs.  Assessment/Plan Principal Problem:   Syncope-due to HTN  and possible vasovagal, though arrhthymias should be ruled out.  Would add 30 day event monitor as outpt.   Active Problems:   Hypertensive urgency- BP better controlled- discussed importance of taking his meds to improve heart function.  On ace and BB- will change to coreg-  may need help to afford meds   Cardiomyopathy- EF 25-30%, no chest pain, prob nuc study to rule out ischemia.  Discussed avoiding toxic substances to his heart ie: ETOH, cocaine,  tobacco Discussed poss need for life vest Dr. Roni Bread and Dr. Beckie Salts at this point no need for lifeest.    Elevated troponin- most likely demand ischemia with severe HTN and acute renal failure.     Malignant hypertension   ARF (acute renal failure)- improved PVCs- freq with K+ today 3.3 and Mag 1.9- will replace K+  + Drug screen      [facility]  Nurse Practitioner Certified [place] Pager [phone] or after 5pm or weekends call [phone] 09/22/2014, 2:33 PM

## 2014-09-22 NOTE — Progress Notes (Signed)
  Echocardiogram 2D Echocardiogram has been performed.  Ronald Young [name] 09/22/2014, 9:08 AM

## 2014-09-23 ENCOUNTER — Inpatient Hospital Stay (HOSPITAL_COMMUNITY): Payer: Self-pay

## 2014-09-23 ENCOUNTER — Inpatient Hospital Stay (HOSPITAL_COMMUNITY): Payer: MEDICAID

## 2014-09-23 DIAGNOSIS — R55 Syncope and collapse: Secondary | ICD-10-CM

## 2014-09-23 DIAGNOSIS — I119 Hypertensive heart disease without heart failure: Secondary | ICD-10-CM | POA: Insufficient documentation

## 2014-09-23 DIAGNOSIS — I429 Cardiomyopathy, unspecified: Secondary | ICD-10-CM

## 2014-09-23 DIAGNOSIS — F1490 Cocaine use, unspecified, uncomplicated: Secondary | ICD-10-CM | POA: Diagnosis present

## 2014-09-23 DIAGNOSIS — I43 Cardiomyopathy in diseases classified elsewhere: Secondary | ICD-10-CM

## 2014-09-23 LAB — BASIC METABOLIC PANEL
Anion gap: 10 (ref 5–15)
BUN: 11 mg/dL (ref 6–20)
CALCIUM: 9 mg/dL (ref 8.9–10.3)
CO2: 24 mmol/L (ref 22–32)
CREATININE: 1.24 mg/dL (ref 0.61–1.24)
Chloride: 106 mmol/L (ref 101–111)
GFR calc Af Amer: >60 mL/min (ref >60)
GFR calc non Af Amer: >60 mL/min (ref >60)
GLUCOSE: 86 mg/dL (ref 65–99)
Potassium: 4.1 mmol/L (ref 3.5–5.1)
SODIUM: 140 mmol/L (ref 135–145)

## 2014-09-23 LAB — CBC
HCT: 42.6 % (ref 39.0–52.0)
Hemoglobin: 13.9 g/dL (ref 13.0–17.0)
MCH: 29.3 pg (ref 26.0–34.0)
MCHC: 32.6 g/dL (ref 30.0–36.0)
MCV: 89.7 fL (ref 78.0–100.0)
Platelets: 239 10*3/uL (ref 150–400)
RBC: 4.75 MIL/uL (ref 4.22–5.81)
RDW: 13.9 % (ref 11.5–15.5)
WBC: 8.4 10*3/uL (ref 4.0–10.5)

## 2014-09-23 LAB — HEMOGLOBIN A1C
Hgb A1c MFr Bld: 5.9 % — ABNORMAL HIGH (ref 4.8–5.6)
Mean Plasma Glucose: 123 mg/dL

## 2014-09-23 MED ORDER — LISINOPRIL 20 MG PO TABS: 20.0000 mg | ORAL_TABLET | Freq: Every day | ORAL | Status: DC

## 2014-09-23 MED ORDER — FUROSEMIDE 20 MG PO TABS: 20.0000 mg | ORAL_TABLET | Freq: Every day | ORAL | Status: DC

## 2014-09-23 MED ORDER — HYDRALAZINE HCL 20 MG/ML IJ SOLN
INTRAMUSCULAR | Status: AC
  Filled 2014-09-23: qty 1

## 2014-09-23 MED ORDER — TECHNETIUM TC 99M SESTAMIBI GENERIC - CARDIOLITE
30.0000 | Freq: Once | INTRAVENOUS | Status: AC | PRN
  Administered 2014-09-23: 30 via INTRAVENOUS

## 2014-09-23 MED ORDER — TECHNETIUM TC 99M SESTAMIBI GENERIC - CARDIOLITE
10.0000 | Freq: Once | INTRAVENOUS | Status: AC | PRN
  Administered 2014-09-23: 10 via INTRAVENOUS

## 2014-09-23 MED ORDER — CARVEDILOL 6.25 MG PO TABS: 6.2500 mg | ORAL_TABLET | Freq: Two times a day (BID) | ORAL | Status: DC

## 2014-09-23 MED ORDER — ACETAMINOPHEN 500 MG PO TABS: 500.0000 mg | ORAL_TABLET | Freq: Four times a day (QID) | ORAL | Status: DC | PRN

## 2014-09-23 NOTE — Progress Notes (Signed)
Notified on-call MD because the patient is in [place].  Looking back he has been in most of the day. Patient is scheduled for a stress test today. The hospitalist group asked me to page Cardiology.

## 2014-09-23 NOTE — Discharge Summary (Signed)
Physician Discharge Summary  Ronald Young VIF:[id number] DOB: Jun 14, 1970 DOA: 09/21/2014  PCP: No PCP Per Patient  Admit date: 09/21/2014 Discharge date: 09/23/2014  Time spent: 45 minutes  Recommendations for Outpatient Follow-up:  1. 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 nuc study 2010 with EF 51% at that time, admitted for syncope and uncontrolled HTN.   Hospital Course:  1. Syncope: no further recurrence.  -EF 25-30% on echo, seen by Cards in consultation.  -had frequent PVCs on telemetry, Cardiology recommended to Continue Coreg and ACE-I for LV dysfunction -Underwent Myoview:  showed no ischemia -Per cardiology cardiomyopathy suspected to be from uncontrolled HTN and cocaine use -not felt to be an ICD candidate due to positive cocaine  2. Accelerated HTN -improved with IV hydralazine, ACE and BB -now improved will be discharged home on Coreg, lisinopril and lasix.  He had run out of his meds and was doing cocaine.  Advised him to avoid cocaine, take meds, exercise, avoid salt.   3. Cardiomyopathy: EF 25-30% on echo. NST pending to r/o ischemia. If no ischemia, ? If cardiomyopathy mediated by frequent PVCs. Continue BB therapy as well as ACE-I. Titrate up as needed / tolerated.  Continue meds.   4. Cocaine abuse -counseled   Procedures:  Myoview: EF 30%, no inducible ischemia per  Dr.Nahser  Consultations:  Cardiology  Discharge Exam: Filed Vitals:   09/23/14 1418  BP: 146/90  Pulse: 72  Temp: 98.7 F (37.1 C)  Resp: 18    General: AAOx3 Cardiovascular: S1S2/RRR Respiratory: CTAB  Discharge Instructions   Discharge Instructions    Diet - low sodium heart healthy    Complete by:  As directed      Increase activity slowly    Complete by:  As directed           Current Discharge Medication List    START taking these medications   Details  carvedilol (COREG) 6.25 MG tablet Take 1 tablet (6.25 mg total) by mouth 2 (two) times daily with a meal. Qty: 30 tablet, Refills: 0    furosemide (LASIX) 20 MG tablet Take 1 tablet (20 mg total) by mouth daily. Qty: 30 tablet, Refills: 0    lisinopril (PRINIVIL,ZESTRIL) 20 MG tablet Take 1 tablet (20 mg total) by mouth daily. Qty: 30 tablet, Refills: 0      CONTINUE these medications which have CHANGED   Details  acetaminophen (TYLENOL) 500 MG tablet Take 1 tablet (500 mg total) by mouth every 6 (six) hours as needed for mild pain or headache (pain). Qty: 30 tablet, Refills: 0      STOP taking these medications     lisinopril-hydrochlorothiazide (PRINZIDE,ZESTORETIC) 10-12.5 MG per tablet        Allergies  Allergen Reactions  . Strawberry Hives   Follow-up Information    Follow up with [facility] On 09/26/2014.   Why:  f/u appointment at 9:00 am   Contact information:   [address] [facility] [id number] [phone]       The results of significant diagnostics from this hospitalization (including imaging, microbiology, ancillary and laboratory) are listed below for reference.    Significant Diagnostic Studies: Ct Head Wo Contrast  09/21/2014   CLINICAL DATA:  Patient reports syncopal episode on Friday with head injury. Cut to the right eye. Severe headache. Dizziness.  EXAM: CT HEAD WITHOUT CONTRAST  TECHNIQUE: Contiguous axial images  were obtained from the base of the skull through the vertex without intravenous contrast.  COMPARISON:  None.  FINDINGS: Ventricles and sulci are appropriate for patient's age. No evidence for acute cortically based infarct, intracranial hemorrhage, mass lesion or mass effect. Orbits are unremarkable. Paranasal sinuses are grossly unremarkable. Mild mucosal thickening ethmoid air cells. Mastoid air cells are well aerated. Calvarium is intact.  IMPRESSION: No acute intracranial process.   Electronically Signed   By: Annia Belt M.D.   On: 09/21/2014 17:20   Nm Myocar Multi W/spect W/wall Motion / Ef  09/23/2014    The left ventricular ejection fraction is moderately decreased (30-44%).  This is a high risk study.  No abnormal perfusion identified.    Microbiology: No results found for this or any previous visit (from the past 240 hour(s)).   Labs: Basic Metabolic Panel:  Recent Labs Lab 09/21/14 1523 09/22/14 0250 09/23/14 0409  NA 140 139 140  K 3.8 3.3* 4.1  CL 107 105 106  CO2 GLUCOSE 108* 106* 86  BUN CREATININE 1.38* 1.15 1.24  CALCIUM 9.0 8.8* 9.0  MG  --  1.9  --   PHOS  --  2.6  --    Liver Function Tests:  Recent Labs Lab 09/22/14 0250  AST 25  ALT 25  ALKPHOS 71  BILITOT 0.5  PROT 6.4*  ALBUMIN 3.1*   No results for input(s): LIPASE, AMYLASE in the last 168 hours. No results for input(s): AMMONIA in the last 168 hours. CBC:  Recent Labs Lab 09/21/14 1523 09/22/14 0250 09/23/14 0409  WBC 9.8 9.1 8.4  NEUTROABS  --  4.7  --   HGB 15.5 13.6 13.9  HCT 46.4 41.3 42.6  MCV 89.4 89.2 89.7  PLT 260 257 239   Cardiac Enzymes:  Recent Labs Lab 09/21/14 1635 09/21/14 1845 09/21/14 2210 09/22/14 0250 09/22/14 0915  TROPONINI 0.04* 0.05* 0.05* 0.05* 0.04*   BNP: BNP (last 3 results)  Recent Labs  09/21/14 1845  BNP 149.6*    ProBNP (last 3 results) No results for input(s): PROBNP in the last 8760 hours.  CBG: No  results for input(s): GLUCAP in the last 168 hours.     SignedZannie Cove  [facility] 09/23/2014, 3:54 PM

## 2014-09-23 NOTE — Progress Notes (Signed)
D/c instructions reviewed with pt and family. Copy of instructions and scripts given to pt. Pt ate dinner prior to leaving, pm coreg given. Pt now ready to go. Waiting wheelchair to take pt out.

## 2014-09-23 NOTE — Progress Notes (Signed)
Called Dr, Sandie Ano to let him know about the patient rhythm.  Doctor stated the patient would be fine to proceed with the test.

## 2014-09-23 NOTE — Progress Notes (Signed)
Pt d/c via wheelchair with belongings with family, escorted by unit NT.

## 2014-09-23 NOTE — Care Management Note (Signed)
Case Management Note  Patient Details  Name: Ronald Young MRN: [id number] Date of Birth: [birth year]  Subjective/Objective:        Pt admitted with syncope            Action/Plan:   CM will set up PCP appointment and assist with medications if needed   Expected Discharge Date:                  Expected Discharge Plan:  Home/Self Care  In-House Referral:     Discharge planning Services  CM Consult, [facility]  Post Acute Care Choice:    Choice offered to:     DME Arranged:    DME Agency:     HH Arranged:    HH Agency:     Status of Service:   Completed, will sign off  Medicare Important Message Given:  No Date Medicare IM Given:    Medicare IM give by:    Date Additional Medicare IM Given:    Additional Medicare Important Message give by:     If discussed at Long Length of Stay Meetings, dates discussed:    Additional Comments: 09/23/14 Ronald Blend, RN, BSN [phone].  CM made PCP appointment with [facility] for Friday 5/27 at 9am.  CM reviewed discharge medications with Dr Ronald Young, all discharge medications are available at [facility] for approximately $4.  CM communicated appointment time and date with pt, pt was very receptive.  No additional CM needs at this time.    Ronald Parr, RN 09/23/2014, 3:47 PM

## 2014-09-23 NOTE — Progress Notes (Addendum)
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 nuc study 2010 with EF 51% at that time. Now with admit for syncope, and EF is EF 25-30% with moderate concentric hypertrophy. G2DD.  Subjective: No complaints. Denies any chest pain. No recurrent syncope/ near syncope. Tolerated exercise stress test well. No exertional angina.    Objective: Vital signs in last 24 hours: Temp:  [97.7 F (36.5 C)-98.9 F (37.2 C)] 98.2 F (36.8 C) (05/24 0430) Pulse Rate:  [71-81] 71 (05/24 0430) Resp:  [18] 18 (05/24 0430) BP: (134-156)/(83-95) 134/83 mmHg (05/24 0430) SpO2:  [98 %-100 %] 100 % (05/24 0430) Last BM Date: 09/21/14  Intake/Output from previous day: 05/23 0701 - 05/24 0700 In: 480 [P.O.:480] Out: 1400 [Urine:1400] Intake/Output this shift:    Medications Current Facility-Administered Medications  Medication Dose Route Frequency Provider Last Rate Last Dose  . acetaminophen (TYLENOL) tablet 650 mg  650 mg Oral Q6H PRN Therisa Doyne, MD   650 mg at 09/21/14 2252   Or  . acetaminophen (TYLENOL) suppository 650 mg  650 mg Rectal Q6H PRN Therisa Doyne, MD      . aspirin EC tablet 81 mg  81 mg Oral Daily Therisa Doyne, MD   81 mg at 09/22/14 0947  . carvedilol (COREG) tablet 6.25 mg  6.25 mg Oral BID WC Leone Brand, NP   6.25 mg at 09/23/14 0524  . enoxaparin (LOVENOX) injection 40 mg  40 mg Subcutaneous Q24H Therisa Doyne, MD   40 mg at 09/22/14 2133  . furosemide (LASIX) tablet 20 mg  20 mg Oral Daily Leone Brand, NP   20 mg at 09/22/14 1640  . hydrALAZINE (APRESOLINE) injection 10 mg  10 mg Intravenous Q6H PRN Rolan Lipa, NP   10 mg at 09/21/14 2251  . HYDROcodone-acetaminophen (NORCO/VICODIN) 5-325 MG per tablet 1-2 tablet  1-2 tablet Oral Q4H PRN Therisa Doyne, MD      . lisinopril (PRINIVIL,ZESTRIL) tablet 20 mg  20 mg Oral Daily Zannie Cove, MD   20 mg at 09/22/14 1415  . ondansetron (ZOFRAN) tablet 4 mg  4 mg  Oral Q6H PRN Therisa Doyne, MD       Or  . ondansetron (ZOFRAN) injection 4 mg  4 mg Intravenous Q6H PRN Therisa Doyne, MD      . sodium chloride 0.9 % injection 3 mL  3 mL Intravenous Q12H Therisa Doyne, MD   3 mL at 09/22/14 2133    PE: General appearance: alert, cooperative and no distress Neck: no carotid bruit and no JVD Lungs: clear to auscultation bilaterally Heart: regular rate, frequent PVCs Extremities: no LEE Pulses: 2+ and symmetric Skin: warm and dry Neurologic: Grossly normal  Lab Results:   Recent Labs  09/21/14 1523 09/22/14 0250 09/23/14 0409  WBC 9.8 9.1 8.4  HGB 15.5 13.6 13.9  HCT 46.4 41.3 42.6  PLT 260 257 239   BMET  Recent Labs  09/21/14 1523 09/22/14 0250 09/23/14 0409  NA 140 139 140  K 3.8 3.3* 4.1  CL 107 105 106  CO2 GLUCOSE 108* 106* 86  BUN CREATININE 1.38* 1.15 1.24  CALCIUM 9.0 8.8* 9.0   PT/INR No results for input(s): LABPROT, INR in the last 72 hours. Cholesterol  Recent Labs  09/22/14 0250  CHOL 180   Cardiac Panel (last 3 results)  Recent Labs  09/21/14 2210 09/22/14 0250 09/22/14 0915  TROPONINI  0.05* 0.05* 0.04*    Studies/Results: 2D echo  09/22/14 Study Conclusions  - Left ventricle: False tendon in the LV apex. The cavity size was mildly dilated. There was moderate concentric hypertrophy. Systolic function was severely reduced. The estimated ejection fraction was in the range of 25% to 30%. Diffuse hypokinesis. Features are consistent with a pseudonormal left ventricular filling pattern, with concomitant abnormal relaxation and increased filling pressure (grade 2 diastolic dysfunction). Doppler parameters are consistent with elevated ventricular end-diastolic filling pressure. - Aortic valve: Trileaflet; normal thickness leaflets. Transvalvular velocity was within the normal range. There was no stenosis. There was no regurgitation. - Mitral  valve: Structurally normal valve. There was trivial regurgitation. - Left atrium: The atrium was mildly dilated. - Right ventricle: The cavity size was mildly decreased. Wall thickness was normal. - Right atrium: The atrium was normal in size. - Tricuspid valve: There was mild regurgitation. - Pulmonary arteries: Systolic pressure was within the normal range. - Inferior vena cava: The vessel was normal in size. - Pericardium, extracardiac: There was no pericardial effusion.  Assessment/Plan    Principal Problem:   Syncope Active Problems:   Hypertensive urgency   Malignant hypertension   ARF (acute renal failure)   Elevated troponin   Cardiomyopathy   1. Syncope: no further recurrence. EF 25-30% on echo. NST pending to r/o ischemia. Has frequent PVCs on telemetry. Continue BB and ACE-I for LV dysfunction. Will need 30 day cardiac event monitor to assess for sustained arrhthymias.   Myoview showed no ischemia LV function is reduced    2. HTN: moderately elevated. Coreg already given. Will give scheduled lisinopril and lasix.  He had run out of his meds and was doing cocaine.  Advised him to avoid cocaine, take meds, exercise, avoid salt.   3. Cardiomyopathy: EF 25-30% on echo. NST pending to r/o ischemia. If no ischemia, ? If cardiomyopathy mediated by frequent PVCs. Continue BB therapy as well as ACE-I.  Titrate up as needed / tolerated.  Continue meds.   4. PVCs - I have reviewed tele personally .  He has PVCs.  Will likely be controlled with coreg and avoiding cocaine.   Follow up with medical doctor ( [facility] )  He may see Dr. Herbie Baltimore if he needs cardiology follow up .  The most beneficial thing that he can do is to take better care of himself - avoid cocaine, take meds, , avoid salt, exercise, lose weight)- we discussed all of these issues.  No further active cardiac issues.  Will sign off.    Nahser, Deloris Ping,  MD  09/23/2014 3:25 PM    [facility] [address] [place],  [address] [place], [place]  [address] Pager [phone] Phone: [phone]; Fax: [phone]   [facility]  [address] [place], [place]  [address] [phone]    Fax [phone]

## 2014-09-24 LAB — NM MYOCAR MULTI W/SPECT W/WALL MOTION / EF
CHL CUP NUCLEAR SDS: 0
CHL CUP STRESS STAGE 1 DBP: 81 mmHg
CHL CUP STRESS STAGE 1 GRADE: 0 %
CHL CUP STRESS STAGE 1 HR: 72 {beats}/min
CHL CUP STRESS STAGE 1 SBP: 132 mmHg
CHL CUP STRESS STAGE 1 SPEED: 0 mph
CHL CUP STRESS STAGE 10 HR: 130 {beats}/min
CHL CUP STRESS STAGE 11 DBP: 117 mmHg
CHL CUP STRESS STAGE 2 GRADE: 0 %
CHL CUP STRESS STAGE 2 HR: 75 {beats}/min
CHL CUP STRESS STAGE 2 SPEED: 0 mph
CHL CUP STRESS STAGE 3 SPEED: 0 mph
CHL CUP STRESS STAGE 4 GRADE: 0 %
CHL CUP STRESS STAGE 4 HR: 81 {beats}/min
CHL CUP STRESS STAGE 4 SPEED: 0 mph
CHL CUP STRESS STAGE 6 SPEED: 1.2 mph
CHL CUP STRESS STAGE 7 HR: 129 {beats}/min
CHL CUP STRESS STAGE 8 GRADE: 12 %
CHL CUP STRESS STAGE 8 HR: 157 {beats}/min
CHL CUP STRESS STAGE 8 SPEED: 2.5 mph
CHL CUP STRESS STAGE 9 HR: 157 {beats}/min
CHL CUP STRESS STAGE 9 SPEED: 2.5 mph
CSEPEW: 7 METS
LHR: 0.3
LV dias vol: 224 mL
LV sys vol: 148 mL
Nuc Stress EF: 34 %
Peak HR: 157 {beats}/min
Percent of predicted max HR: 88 %
SRS: 2
SSS: 2
Stage 10 Grade: 0 %
Stage 10 Speed: 0 mph
Stage 11 Grade: 0 %
Stage 11 HR: 96 {beats}/min
Stage 11 SBP: 162 mmHg
Stage 11 Speed: 0 mph
Stage 3 Grade: 0 %
Stage 3 HR: 73 {beats}/min
Stage 5 Grade: 0 %
Stage 5 HR: 83 {beats}/min
Stage 5 Speed: 1.2 mph
Stage 6 Grade: 0 %
Stage 6 HR: 83 {beats}/min
Stage 7 Grade: 10 %
Stage 7 Speed: 1.7 mph
Stage 8 DBP: 93 mmHg
Stage 8 SBP: 243 mmHg
Stage 9 Grade: 12 %
TID: 1

## 2014-09-26 ENCOUNTER — Emergency Department (HOSPITAL_COMMUNITY): Payer: Self-pay

## 2014-09-26 ENCOUNTER — Ambulatory Visit: Payer: Self-pay | Attending: Family Medicine | Admitting: Family Medicine

## 2014-09-26 ENCOUNTER — Encounter (HOSPITAL_COMMUNITY): Payer: Self-pay | Admitting: Physical Medicine and Rehabilitation

## 2014-09-26 ENCOUNTER — Other Ambulatory Visit: Payer: Self-pay

## 2014-09-26 ENCOUNTER — Emergency Department (HOSPITAL_COMMUNITY)
Admission: EM | Admit: 2014-09-26 | Discharge: 2014-09-26 | Disposition: A | Discharge status: Home / Self Care | Payer: Self-pay | Attending: Emergency Medicine | Admitting: Emergency Medicine

## 2014-09-26 ENCOUNTER — Encounter: Payer: Self-pay | Admitting: Family Medicine

## 2014-09-26 VITALS — BP 190/106 | HR 42 | Temp 97.8°F | Resp 18 | Ht 72.0 in | Wt 265.0 lb

## 2014-09-26 DIAGNOSIS — F17210 Nicotine dependence, cigarettes, uncomplicated: Secondary | ICD-10-CM | POA: Insufficient documentation

## 2014-09-26 DIAGNOSIS — Z720 Tobacco use: Secondary | ICD-10-CM | POA: Insufficient documentation

## 2014-09-26 DIAGNOSIS — R55 Syncope and collapse: Secondary | ICD-10-CM | POA: Insufficient documentation

## 2014-09-26 DIAGNOSIS — Z79899 Other long term (current) drug therapy: Secondary | ICD-10-CM | POA: Insufficient documentation

## 2014-09-26 DIAGNOSIS — I10 Essential (primary) hypertension: Secondary | ICD-10-CM | POA: Insufficient documentation

## 2014-09-26 DIAGNOSIS — F1490 Cocaine use, unspecified, uncomplicated: Secondary | ICD-10-CM

## 2014-09-26 DIAGNOSIS — M1990 Unspecified osteoarthritis, unspecified site: Secondary | ICD-10-CM | POA: Insufficient documentation

## 2014-09-26 DIAGNOSIS — R9431 Abnormal electrocardiogram [ECG] [EKG]: Secondary | ICD-10-CM | POA: Insufficient documentation

## 2014-09-26 DIAGNOSIS — R001 Bradycardia, unspecified: Secondary | ICD-10-CM

## 2014-09-26 DIAGNOSIS — I429 Cardiomyopathy, unspecified: Secondary | ICD-10-CM

## 2014-09-26 DIAGNOSIS — F1410 Cocaine abuse, uncomplicated: Secondary | ICD-10-CM | POA: Insufficient documentation

## 2014-09-26 LAB — CBC WITH DIFFERENTIAL/PLATELET
BASOS ABS: 0 10*3/uL (ref 0.0–0.1)
Basophils Relative: 0 % (ref 0–1)
Eosinophils Absolute: 0.2 10*3/uL (ref 0.0–0.7)
Eosinophils Relative: 3 % (ref 0–5)
HCT: 44.5 % (ref 39.0–52.0)
Hemoglobin: 14.8 g/dL (ref 13.0–17.0)
Lymphocytes Relative: 31 % (ref 12–46)
Lymphs Abs: 2.1 10*3/uL (ref 0.7–4.0)
MCH: 29.5 pg (ref 26.0–34.0)
MCHC: 33.3 g/dL (ref 30.0–36.0)
MCV: 88.8 fL (ref 78.0–100.0)
Monocytes Absolute: 0.5 10*3/uL (ref 0.1–1.0)
Monocytes Relative: 7 % (ref 3–12)
NEUTROS ABS: 3.9 10*3/uL (ref 1.7–7.7)
Neutrophils Relative %: 59 % (ref 43–77)
PLATELETS: 249 10*3/uL (ref 150–400)
RBC: 5.01 MIL/uL (ref 4.22–5.81)
RDW: 13.5 % (ref 11.5–15.5)
WBC: 6.7 10*3/uL (ref 4.0–10.5)

## 2014-09-26 LAB — COMPREHENSIVE METABOLIC PANEL
ALBUMIN: 3.4 g/dL — AB (ref 3.5–5.0)
ALK PHOS: 81 U/L (ref 38–126)
ALT: 26 U/L (ref 17–63)
AST: 25 U/L (ref 15–41)
Anion gap: 10 (ref 5–15)
BILIRUBIN TOTAL: 0.2 mg/dL — AB (ref 0.3–1.2)
BUN: 10 mg/dL (ref 6–20)
CHLORIDE: 104 mmol/L (ref 101–111)
CO2: 27 mmol/L (ref 22–32)
CREATININE: 1.58 mg/dL — AB (ref 0.61–1.24)
Calcium: 9.4 mg/dL (ref 8.9–10.3)
GFR, EST NON AFRICAN AMERICAN: 52 mL/min — AB (ref >60)
Glucose, Bld: 120 mg/dL — ABNORMAL HIGH (ref 65–99)
Potassium: 4.5 mmol/L (ref 3.5–5.1)
Sodium: 141 mmol/L (ref 135–145)
TOTAL PROTEIN: 7.3 g/dL (ref 6.5–8.1)

## 2014-09-26 LAB — I-STAT TROPONIN, ED: Troponin i, poc: 0.03 ng/mL (ref 0.00–0.08)

## 2014-09-26 IMAGING — CR DG CHEST 2V
2 series · 2 of 2 positions shown · non-contrast
Comparison: [DATE]

CLINICAL DATA: Hypertension.  Cardiomyopathy.  Abnormal EKG.

EXAM:
CHEST  2 VIEW

[w chest pa]
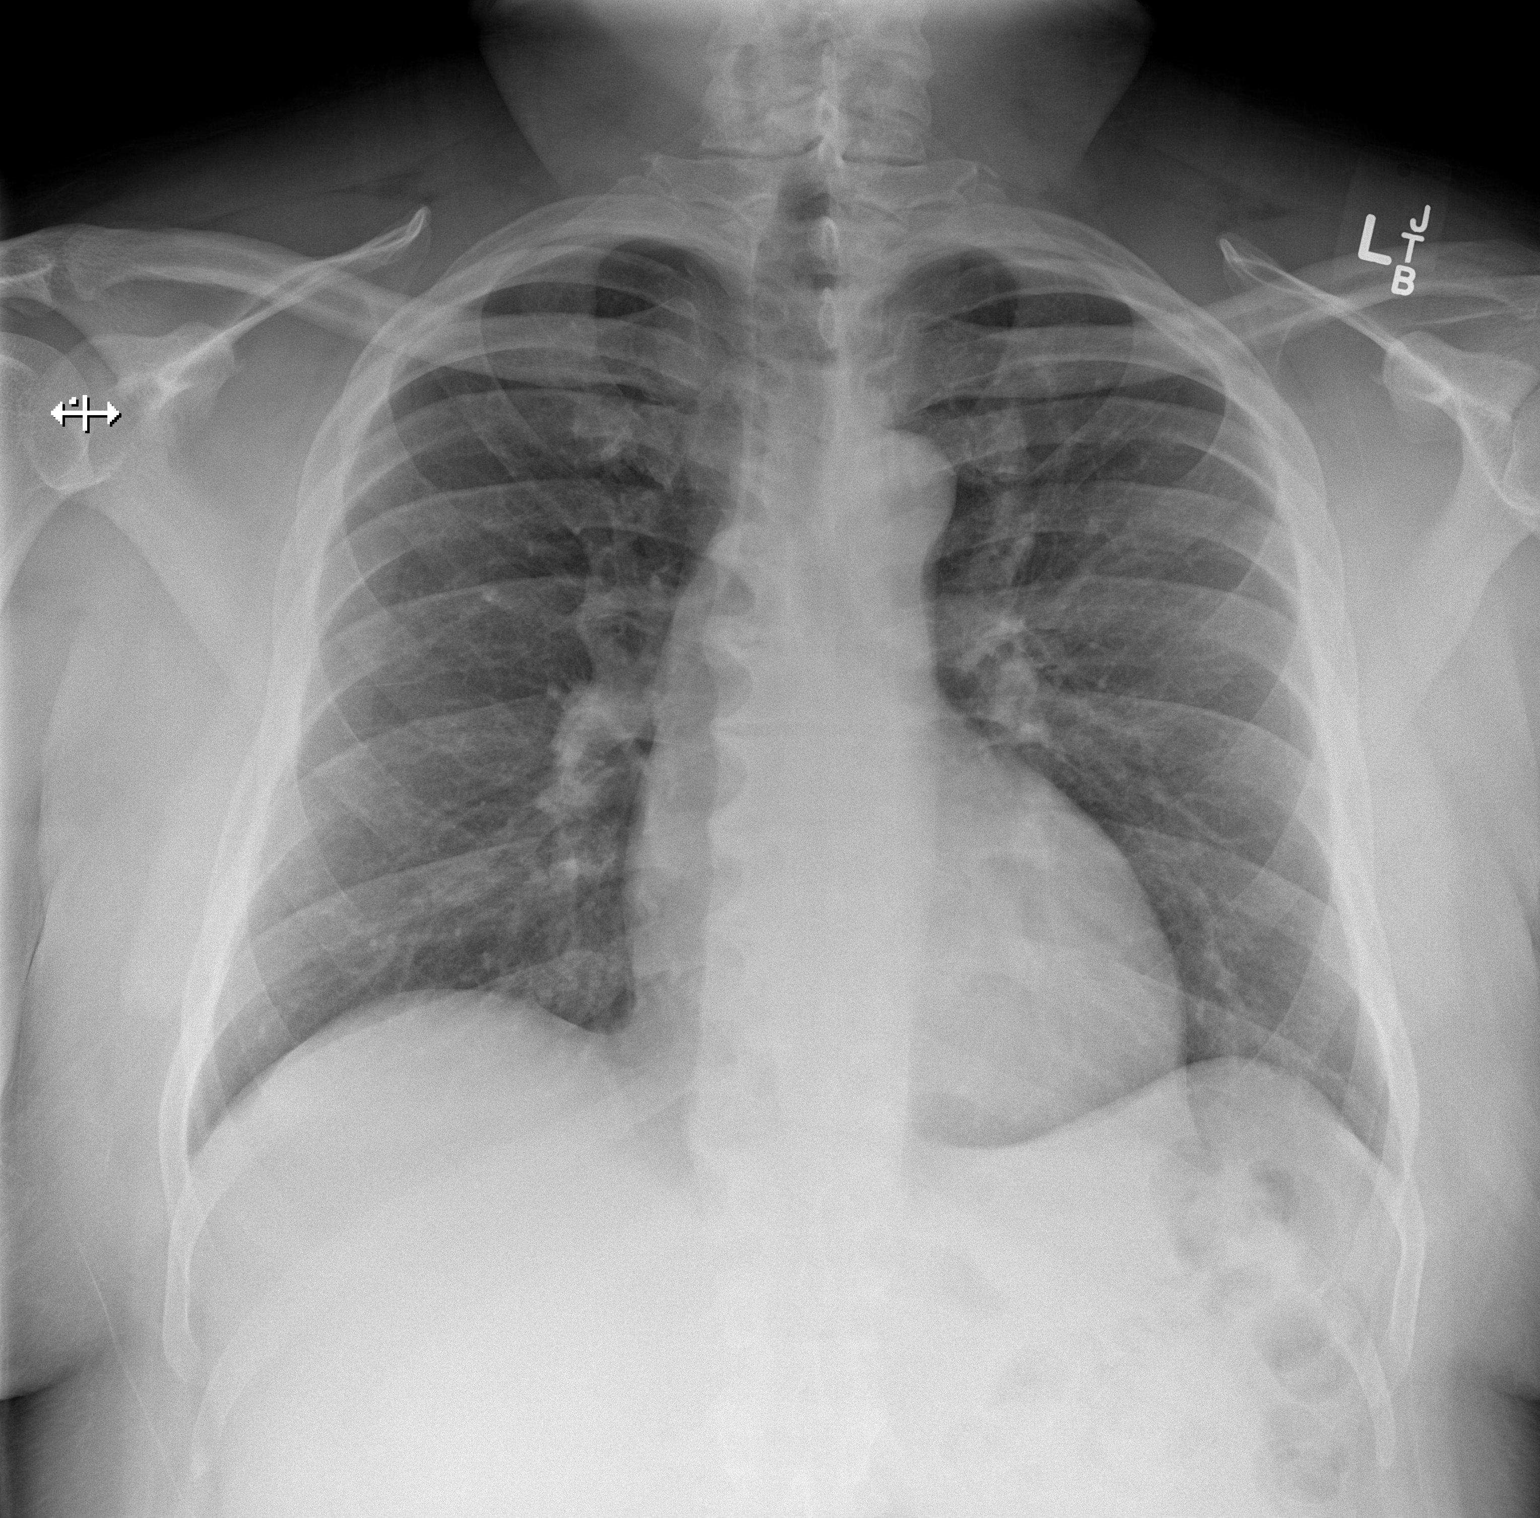

[w chest lat]
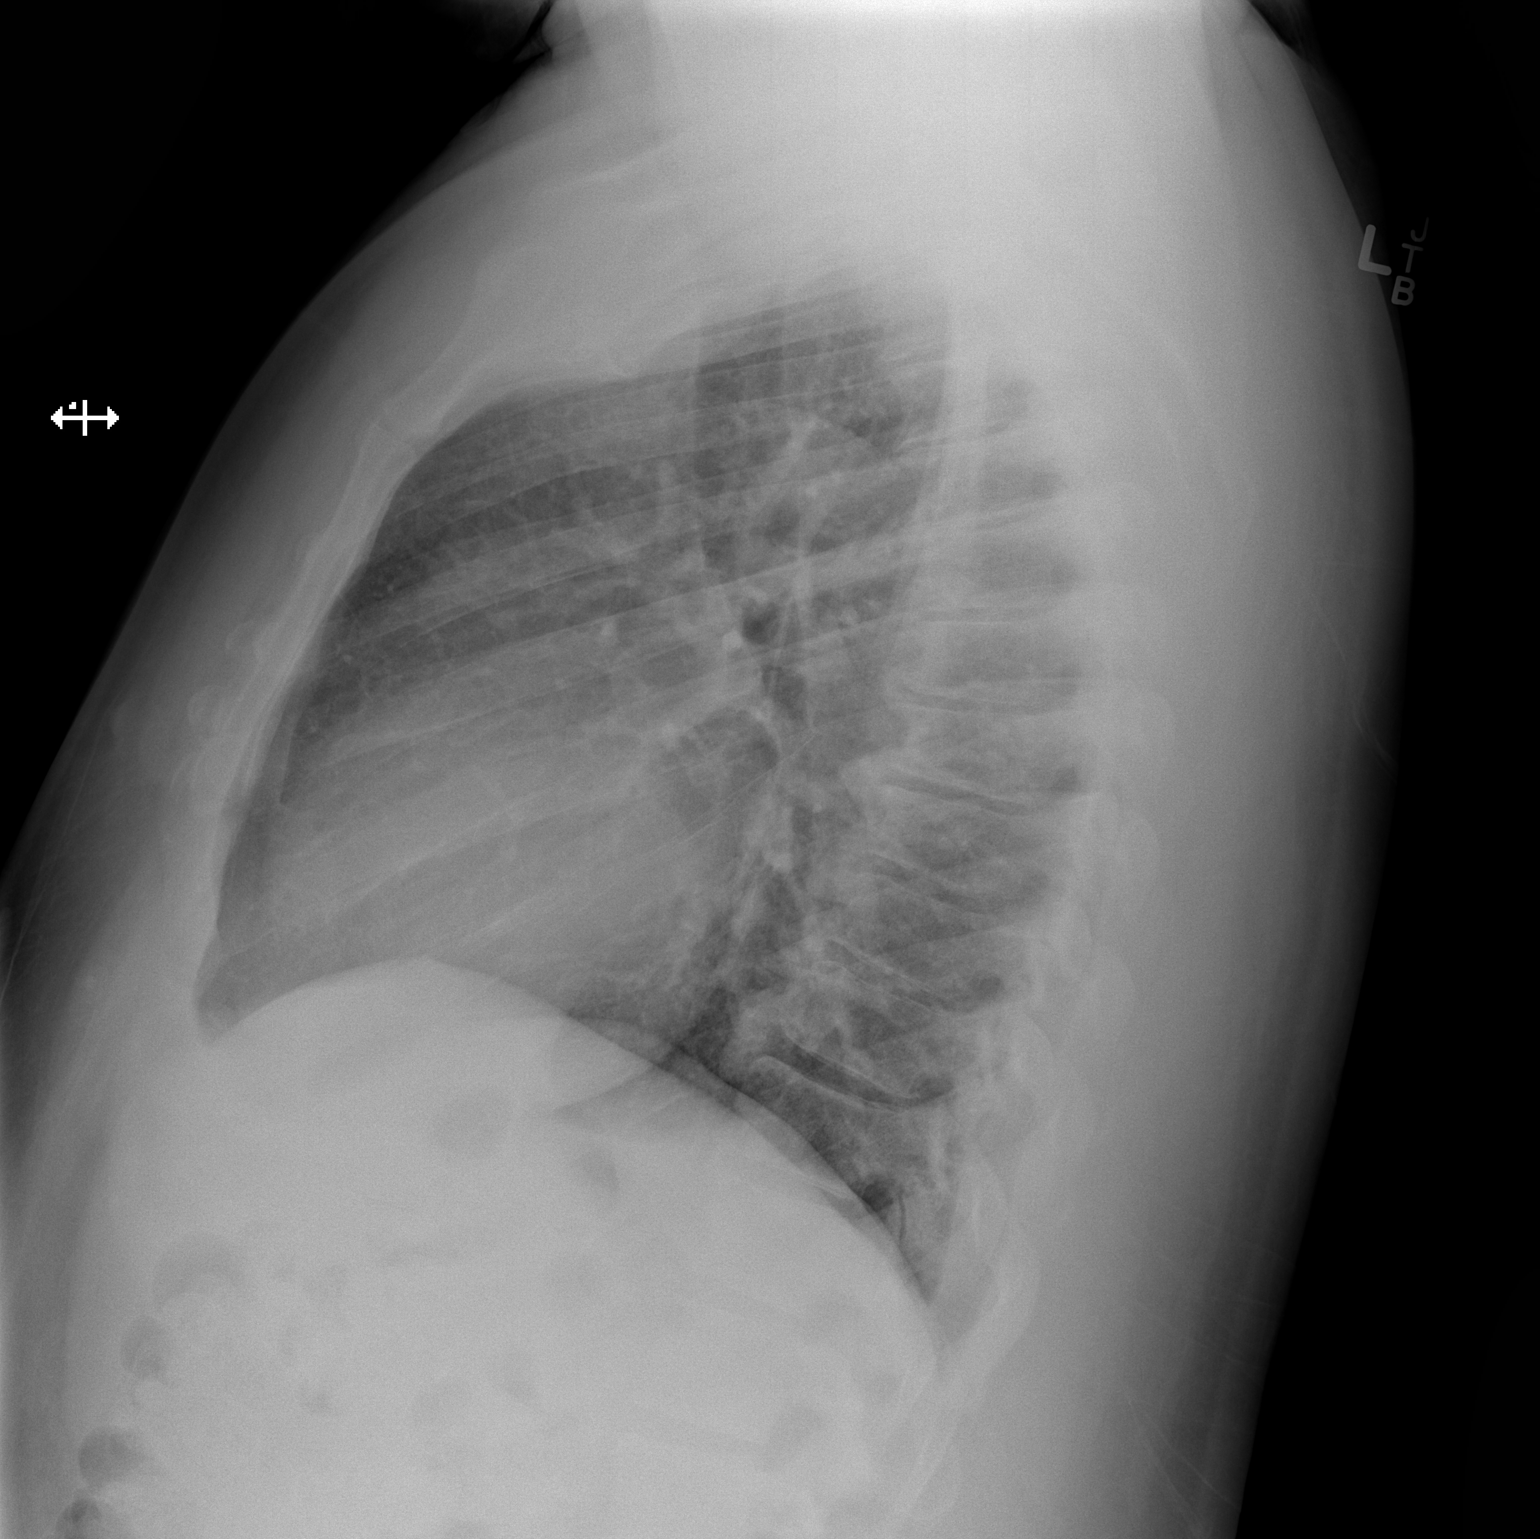

[2 of 2 positions shown; findings below may reference images not displayed]

FINDINGS: Moderate thoracic spondylosis. Midline trachea. Borderline
cardiomegaly. Mediastinal contours otherwise within normal limits.
No pleural effusion or pneumothorax. No congestive failure. Clear
lungs.
IMPRESSION: Borderline cardiomegaly, without acute disease.

## 2014-09-26 MED ORDER — LISINOPRIL 40 MG PO TABS: 40.0000 mg | ORAL_TABLET | Freq: Every day | ORAL | Status: DC

## 2014-09-26 NOTE — Patient Instructions (Signed)

## 2014-09-26 NOTE — ED Notes (Signed)
Pt presents to department for evaluation of abnormal EKG, sent to ED for further evaluation. Was recently hospitalized for hypertension. Pt denies pain upon arrival. Denies SOB. He is alert and oriented x4.

## 2014-09-26 NOTE — ED Provider Notes (Signed)
CSN: [id number]     Arrival date & time 09/26/14  1127 History   First MD Initiated Contact with Patient 09/26/14 1359     Chief Complaint  Patient presents with  . Abnormal ECG     (Consider location/radiation/quality/duration/timing/severity/associated sxs/prior Treatment) HPI Comments: 44 year old male presents from clinic with complaint of abnormal EKG. Patient was recently hospitalized for syncope in the setting of hypertensive emergency, cardiac dysfunction secondary to cocaine use with EF of 35%. Was started on antihypertensives. Since that time has had no further syncopal episodes. He has had no chest pain, palpitations, shortness of breath. He reports being completely asymptomatic. Has been compliant with his medications. Went for follow-up visit in clinic today. He states they did an EKG on the patient and refer him to the ER with concerns for abnormal EKG   Past Medical History  Diagnosis Date  . Arthritis   . Hypertension   . Cardiomyopathy 09/22/2014   History reviewed. No pertinent past surgical history. Family History  Problem Relation Age of Onset  . Hypertension Mother   . Colon cancer Other    History  Substance Use Topics  . Smoking status: Light Tobacco Smoker    Types: Cigarettes  . Smokeless tobacco: Not on file  . Alcohol Use: Yes    Review of Systems  Constitutional: Negative for fever.  HENT: Negative for congestion.   Respiratory: Negative for shortness of breath.   Cardiovascular: Negative for chest pain.  Gastrointestinal: Negative for abdominal pain.  Musculoskeletal: Negative for arthralgias.  Skin: Negative for rash.  Neurological: Negative for headaches.  Psychiatric/Behavioral: Negative for confusion.  All other systems reviewed and are negative.     Allergies  Strawberry  Home Medications   Prior to Admission medications   Medication Sig Start Date End Date Taking? Authorizing Provider  acetaminophen (TYLENOL) 500 MG tablet Take  1 tablet (500 mg total) by mouth every 6 (six) hours as needed for mild pain or headache (pain). 09/23/14   Zannie Cove, MD  carvedilol (COREG) 6.25 MG tablet Take 1 tablet (6.25 mg total) by mouth 2 (two) times daily with a meal. 09/23/14   Zannie Cove, MD  furosemide (LASIX) 20 MG tablet Take 1 tablet (20 mg total) by mouth daily. 09/23/14   Zannie Cove, MD  lisinopril (PRINIVIL,ZESTRIL) 40 MG tablet Take 1 tablet (40 mg total) by mouth daily. 09/26/14   Jaclyn Shaggy, MD   BP 154/102 mmHg  Pulse 62  Temp(Src) 98.9 F (37.2 C) (Oral)  Resp 27  Ht  (1.854 m)  Wt 265 lb (120.203 kg)  BMI 34.97 kg/m2  SpO2 99% Physical Exam  Constitutional: He appears well-developed and well-nourished.  HENT:  Head: Normocephalic.  Eyes: Pupils are equal, round, and reactive to light.  Neck: Normal range of motion.  Cardiovascular: Normal rate and intact distal pulses.   Pulmonary/Chest: Effort normal. No respiratory distress.  Abdominal: Soft. He exhibits no distension. There is no tenderness.  Musculoskeletal: He exhibits no tenderness.  Neurological: He is alert. No cranial nerve deficit. He exhibits normal muscle tone.  Psychiatric: He has a normal mood and affect.  Vitals reviewed.   ED Course  Procedures (including critical care time) Labs Review Labs Reviewed  COMPREHENSIVE METABOLIC PANEL - Abnormal; Notable for the following:    Glucose, Bld 120 (*)    Creatinine, Ser 1.58 (*)    Albumin 3.4 (*)    Total Bilirubin 0.2 (*)    GFR calc non Af Amer 52 (*)  All other components within normal limits  CBC WITH DIFFERENTIAL/PLATELET  Rosezena Sensor, ED    Imaging Review Dg Chest 2 View  09/26/2014   CLINICAL DATA:  Hypertension.  Cardiomyopathy.  Abnormal EKG.  EXAM: CHEST  2 VIEW  COMPARISON:  01/27/2009  FINDINGS: Moderate thoracic spondylosis. Midline trachea. Borderline cardiomegaly. Mediastinal contours otherwise within normal limits. No pleural effusion or  pneumothorax. No congestive failure. Clear lungs.  IMPRESSION: Borderline cardiomegaly, without acute disease.   Electronically Signed   By: Jeronimo Greaves M.D.   On: 09/26/2014 12:11     EKG Interpretation None      MDM  44 year old male from clinic with concern for abnormal EKG. Specifically EKG read at outside clinic stated concern for WPW. On our review EKG appears to have no signs of WPW/delta waves. It does appears though there are multiple PVCs which may have been interpreted as a WPW wave. Multiple repeat EKGs here showed no acute changes from previous. Discussed EKG with cardiology attending on call who is in agreement this is not WPW. Patient has had no syncopal episodes, chest pain, shortness of breath or other concerning signs or symptoms since discharge. He has been compliant with his medications. He is in no apparent distress here. His vitals are normal. Pressures are slightly elevated however dramatically improved from previously. We'll recommend he follow up again with clinic in approximately one week for recheck. Also gave strict return precautions if he were to have any more syncopal episodes chest pain shortness breath or other concerning. Patient voiced understanding and he was discharged.   Final diagnoses:  Abnormal EKG        Bridgett Larsson, MD 09/26/14 1452  Blake Divine, MD 09/30/14 1208

## 2014-09-26 NOTE — Progress Notes (Signed)
Patient was in hospital high BP. Patient got too hot last Friday, passed out, hit head, above right eye. Went to hospital Saturday, was admitted for HTN. Patient reports feeling "pretty good". Patient denies dizziness or pain. Patient feels medications makes him sleep a lot. Patient is a light smoker, one cigarette every now and then, ready to quit.

## 2014-09-26 NOTE — Progress Notes (Signed)
Subjective:    Patient ID: Ronald Young, male    DOB: 1970-07-30, 44 y.o.   MRN: [id number]  HPI  Admit Date: 09/21/14 Discharge Date: 09/23/14  Ronald Young is a 44 year old male with a history of cocaine abuse, uncontrolled hypertension who had been noncompliant with antihypertensives  who presented to the ED with a syncopal episode which occurred at work in which he hit his head; this was preceded by lightheadedness and headache. On presentation to the ED his blood pressure was 200/107 and he was placed on labetalol. Troponins were 0.04, 0.05. His EKG reveals sinus rhythm with PVCs, bigeminy, LVH with repolarization abnormality PACs, nonspecific T-wave changes. He had a 2-D echo which revealed ejection fraction of 25-30%, diffuse hypokinesis, grade 2 diastolic dysfunction, mildly dilated left atrium and normal systolic function. CT head was negative for intracranial bleed. He had a Myoview which revealed no ischemia and reduced left ventricle function.  He was seen by cardiology and was placed on Lasix, ACE inhibitor, Coreg; it was recommended that he would need a 30 day cardiac event monitor to assess for sustained arrhythmias however he was not discharged with cardiology appointment.  Interval History:  Complains he works at [employer] where it is so hot and he thinks that contributed to his passing out; he also had a similar episode at home where he had near syncope. He would like to have a note to excuse him from work for 4 days. Complains his antihypertensives are sedating but he is unsure if it is due to fatigue. Denies chest pains at this time.  Past Medical History  Diagnosis Date  . Arthritis   . Hypertension   . Cardiomyopathy 09/22/2014    History reviewed. No pertinent past surgical history.  Family History  Problem Relation Age of Onset  . Hypertension Mother   . Colon cancer Other     History   Social History  . Marital Status: Single    Spouse Name: N/A  .  Number of Children: N/A  . Years of Education: N/A   Occupational History  . Not on file.   Social History Main Topics  . Smoking status: Light Tobacco Smoker    Types: Cigarettes  . Smokeless tobacco: Not on file  . Alcohol Use: Yes     Comment: occ twice a year  . Drug Use: No  . Sexual Activity: Not on file   Other Topics Concern  . Not on file   Social History Narrative    Allergies  Allergen Reactions  . Strawberry Hives    Current Outpatient Prescriptions on File Prior to Visit  Medication Sig Dispense Refill  . acetaminophen (TYLENOL) 500 MG tablet Take 1 tablet (500 mg total) by mouth every 6 (six) hours as needed for mild pain or headache (pain). 30 tablet 0  . carvedilol (COREG) 6.25 MG tablet Take 1 tablet (6.25 mg total) by mouth 2 (two) times daily with a meal. 30 tablet 0  . furosemide (LASIX) 20 MG tablet Take 1 tablet (20 mg total) by mouth daily. 30 tablet 0  . [DISCONTINUED] amLODipine (NORVASC) 10 MG tablet Take 1 tablet (10 mg total) by mouth daily. (Patient not taking: Reported on 08/29/2014) 30 tablet 0  . [DISCONTINUED] benazepril (LOTENSIN) 10 MG tablet Take 1 tablet (10 mg total) by mouth daily. (Patient not taking: Reported on 08/29/2014) 30 tablet 0   No current facility-administered medications on file prior to visit.    Review of Systems  Constitutional:  Positive for fatigue. Negative for activity change and appetite change.  HENT: Negative for sinus pressure and sore throat.   Eyes: Negative for visual disturbance.  Respiratory: Negative for chest tightness and shortness of breath.   Cardiovascular: Negative for chest pain and palpitations.  Gastrointestinal: Negative for abdominal pain and abdominal distention.  Endocrine: Negative for cold intolerance, heat intolerance and polyphagia.  Genitourinary: Negative for dysuria, frequency and difficulty urinating.  Musculoskeletal: Negative for back pain, joint swelling and arthralgias.  Skin:  Negative for color change.  Neurological: Positive for syncope and light-headedness. Negative for dizziness, tremors and weakness.  Psychiatric/Behavioral: Positive for sleep disturbance. Negative for suicidal ideas and behavioral problems.         Objective: Filed Vitals:   09/26/14 0905 09/26/14 1000  BP: 182/100 190/106  Pulse: 42   Temp: 97.8 F (36.6 C)   TempSrc: Oral   Resp: 18   Height: 6' (1.829 m)   Weight: 265 lb (120.203 kg)   SpO2: 99%       Physical Exam  Constitutional: He is oriented to person, place, and time. He appears well-developed and well-nourished.  HENT:  Head: Normocephalic and atraumatic.  Right Ear: External ear normal.  Left Ear: External ear normal.  Eyes: Conjunctivae and EOM are normal. Pupils are equal, round, and reactive to light.  Neck: Normal range of motion. Neck supple. No tracheal deviation present.  Cardiovascular: Regular rhythm.  Bradycardia present.  Exam reveals gallop and S3.   No murmur heard. Pulmonary/Chest: Effort normal and breath sounds normal. No respiratory distress. He has no wheezes. He exhibits no tenderness.  Abdominal: Soft. Bowel sounds are normal. He exhibits no mass. There is no tenderness.  Musculoskeletal: Normal range of motion. He exhibits no edema or tenderness.  Neurological: He is alert and oriented to person, place, and time.  Skin: Skin is warm and dry.  Psychiatric: He has a normal mood and affect.          Assessment & Plan:  44 year old male with a history of uncontrolled hypertension, cocaine abuse, recently diagnosed with cardiomyopathy with an EF of 25-30% who was recently managed for syncope and blood pressure today is significantly elevated.  Cardiomyopathy: Likely secondary to uncontrolled hypertension and cocaine use. EF 25-30%. EKG in the clinic today reveals heart rate of 74 bpm, PACs and bigeminy, WPW pattern type B Refer to the ED given a history of recurrent syncope, cardiomyopathy  and recent EKG findings of Wolff-Parkinson-No syndrome as he is high risk for sudden cardiac death.     Hypertension: Uncontrolled despite patient's compliance with medication. I will increase lisinopril from 20 to 40 mg and we'll check renal function at his next visit.   Bradycardia: Heart rate fluctuates from the low 40s to 80s I will not change his dose of carvedilol for now.   Cocaine abuse: Conseled strongly advised to quit.  Disclaimer: This note was dictated with voice recognition software. Similar sounding words can inadvertently be transcribed and this note may contain transcription errors which may not have been corrected upon publication of note.

## 2014-09-26 NOTE — ED Notes (Signed)
Dr. Wofford at the bedside.  

## 2014-10-03 ENCOUNTER — Ambulatory Visit: Payer: Self-pay | Admitting: Family Medicine

## 2014-10-08 ENCOUNTER — Ambulatory Visit: Payer: Self-pay | Admitting: Cardiology

## 2014-10-13 ENCOUNTER — Ambulatory Visit: Payer: Self-pay | Attending: Family Medicine | Admitting: Family Medicine

## 2014-10-13 ENCOUNTER — Encounter: Payer: Self-pay | Admitting: Family Medicine

## 2014-10-13 VITALS — BP 172/102 | HR 63 | Temp 97.9°F | Resp 18 | Ht 72.0 in | Wt 266.4 lb

## 2014-10-13 DIAGNOSIS — I160 Hypertensive urgency: Secondary | ICD-10-CM

## 2014-10-13 DIAGNOSIS — N179 Acute kidney failure, unspecified: Secondary | ICD-10-CM | POA: Insufficient documentation

## 2014-10-13 DIAGNOSIS — I10 Essential (primary) hypertension: Secondary | ICD-10-CM

## 2014-10-13 DIAGNOSIS — I429 Cardiomyopathy, unspecified: Secondary | ICD-10-CM | POA: Insufficient documentation

## 2014-10-13 DIAGNOSIS — F1410 Cocaine abuse, uncomplicated: Secondary | ICD-10-CM | POA: Insufficient documentation

## 2014-10-13 DIAGNOSIS — Z87891 Personal history of nicotine dependence: Secondary | ICD-10-CM | POA: Insufficient documentation

## 2014-10-13 DIAGNOSIS — F1490 Cocaine use, unspecified, uncomplicated: Secondary | ICD-10-CM

## 2014-10-13 LAB — BASIC METABOLIC PANEL
BUN: 19 mg/dL (ref 6–23)
CHLORIDE: 107 meq/L (ref 96–112)
CO2: 28 mEq/L (ref 19–32)
CREATININE: 1.14 mg/dL (ref 0.50–1.35)
Calcium: 9.6 mg/dL (ref 8.4–10.5)
Glucose, Bld: 102 mg/dL — ABNORMAL HIGH (ref 70–99)
Potassium: 4.9 mEq/L (ref 3.5–5.3)
Sodium: 144 mEq/L (ref 135–145)

## 2014-10-13 MED ORDER — CARVEDILOL 6.25 MG PO TABS: 6.2500 mg | ORAL_TABLET | Freq: Two times a day (BID) | ORAL | Status: DC

## 2014-10-13 NOTE — Progress Notes (Signed)
Patient here for follow up for HTN and prescription refills.  Patient states he feels "good."  Patient has no complaints of pain at this time. Patient requests refills on Carvedilol and Furosemide. Patient states he is completely out of Carvedilol and last took it 10/12/14.  BP: 180/102, HR: 63 Manual BP: 172/102

## 2014-10-13 NOTE — Progress Notes (Signed)
Subjective:    Patient ID: Ronald Young, male    DOB: 1970/10/09, 44 y.o.   MRN: [id number]  HPI  Ronald Young is a 44 year old male with a history of cocaine abuse, uncontrolled hypertension who had been noncompliant with antihypertensives  Who was hospitalized at [facility] between 09/21/14- 09/23/14 after he presented to the ED with a syncopal episode and on presentation to the ED his blood pressure was 200/107 and he was placed on labetalol. Troponins were 0.04, 0.05. His EKG reveals sinus rhythm with PVCs, bigeminy, LVH with repolarization abnormality PACs, nonspecific T-wave changes. He had a 2-D echo which revealed ejection fraction of 25-30%, diffuse hypokinesis, grade 2 diastolic dysfunction, mildly dilated left atrium and normal systolic function. CT head was negative for intracranial bleed. He had a Myoview which revealed no ischemia and reduced left ventricle function.  He was seen by cardiology and was placed on Lasix, ACE inhibitor, Coreg; it was recommended that he would need a 30 day cardiac event monitor to assess for sustained arrhythmias however he was not discharged with cardiology appointment.  At his last office visit his blood pressure was severely elevated and his Lisinopril was increased from 20 to ; he was also scheduled with Cardiology in- house. He was referred to the ED due to EKG findings concerning for  WPW especially in the setting of syncope at work.  He was evaluated at the ED and was discharged after repeat EKG revealed PVCs.  Interval History:  He denies any syncope since last visit and claims compliance with medications even though his BP is elevated but he admits running out of Coreg and having a cold for which he used OTC cold remedies.   Past Medical History  Diagnosis Date  . Arthritis   . Hypertension   . Cardiomyopathy 09/22/2014    History reviewed. No pertinent past surgical history.  History   Social History  . Marital Status: Single   Spouse Name: N/A  . Number of Children: N/A  . Years of Education: N/A   Occupational History  . Not on file.   Social History Main Topics  . Smoking status: Former Smoker    Types: Cigarettes  . Smokeless tobacco: Not on file  . Alcohol Use: Yes     Comment: "drank a couple beers last weekend"  . Drug Use: Yes    Special: Marijuana     Comment: Last use 10/11/14.  Marland Kitchen Sexual Activity: Not on file   Other Topics Concern  . Not on file   Social History Narrative    Allergies  Allergen Reactions  . Strawberry Hives    Current Outpatient Prescriptions on File Prior to Visit  Medication Sig Dispense Refill  . acetaminophen (TYLENOL) 500 MG tablet Take 1 tablet (500 mg total) by mouth every 6 (six) hours as needed for mild pain or headache (pain). 30 tablet 0  . furosemide (LASIX) 20 MG tablet Take 1 tablet (20 mg total) by mouth daily. 30 tablet 0  . lisinopril (PRINIVIL,ZESTRIL) 40 MG tablet Take 1 tablet (40 mg total) by mouth daily. 30 tablet 2  . [DISCONTINUED] amLODipine (NORVASC) 10 MG tablet Take 1 tablet (10 mg total) by mouth daily. (Patient not taking: Reported on 08/29/2014) 30 tablet 0  . [DISCONTINUED] benazepril (LOTENSIN) 10 MG tablet Take 1 tablet (10 mg total) by mouth daily. (Patient not taking: Reported on 08/29/2014) 30 tablet 0   No current facility-administered medications on file prior to visit.  Review of Systems  General: negative for fever, weight loss, appetite change Eyes: no visual symptoms. ENT: no ear symptoms, no sinus tenderness, positive for  nasal congestion , no sore throat. Neck: no pain  Respiratory: no wheezing, shortness of breath, cough Cardiovascular: no chest pain, no dyspnea on exertion, no pedal edema, no orthopnea. Gastrointestinal: no abdominal pain, no diarrhea, no constipation Genito-Urinary: no urinary frequency, no dysuria, no polyuria. Hematologic: no bruising Endocrine: no cold or heat intolerance Neurological: no  headaches, no seizures, no tremors Musculoskeletal: no joint pains, no joint swelling Skin: no pruritus, no rash. Psychological: no depression, no anxiety,       Objective: Filed Vitals:   10/13/14 1106 10/13/14 1117  BP: 180/102 172/102  Pulse: 63   Temp: 97.9 F (36.6 C)   TempSrc: Oral   Resp: 18   Height: 6' (1.829 m)   Weight: 266 lb 6.4 oz (120.838 kg)   SpO2: 98%        Physical Exam Constitutional: normal appearing,  Eyes: PERRLA HEENT: Head is atraumatic, normal sinuses, normal oropharynx, normal appearing tonsils and palate, tympanic membrane is normal bilaterally. Neck: normal range of motion, no thyromegaly, no JVD Cardiovascular: normal rate and rhythm, normal heart sounds, no murmurs, rub or gallop, no pedal edema Respiratory: clear to auscultation bilaterally, no wheezes, no rales, no rhonchi Abdomen: soft, not tender to palpation, normal bowel sounds, no enlarged organs Extremities: Full ROM, no tenderness in joints Skin: warm and dry, no lesions. Neurological: alert, oriented x3, cranial nerves I-XII grossly intact , normal motor strength, normal sensation. Psychological: normal mood.        Assessment & Plan:  44 year old male with a history of uncontrolled hypertension, cocaine abuse, recently diagnosed with cardiomyopathy with an EF of 25-30% who was recently managfor syncope and blood pressure today is significantly elevated.  Cardiomyopathy: Likely secondary to uncontrolled hypertension and cocaine use. EF 25-30%. Advised to keep upcoming appointment with Cardiology.  Hypertension: Uncontrolled due to running out of Coreg and possibly recent OTC cold remedies used Continue Lisinopril 40 mg and we'll check renal function.  Acute renal Failure: Noticed during Hospitalization; most recent cr was 1.58, baseline is 1.2 Will repeat renal function.  Cocaine abuse: Conseled strongly advised to quit.  Disclaimer: This note was dictated with voice  recognition software. Similar sounding words can inadvertently be transcribed and this note may contain transcription errors which may not have been corrected upon publication of note.

## 2014-10-13 NOTE — Progress Notes (Signed)
Quick Note:  Please inform the patient that labs are normal. Thank you. ______ 

## 2014-10-13 NOTE — Patient Instructions (Signed)

## 2014-10-14 ENCOUNTER — Telehealth: Payer: Self-pay

## 2014-10-14 NOTE — Telephone Encounter (Signed)
-----   Message from Enobong Amao, MD sent at 10/13/2014 10:54 PM EDT ----- Please inform the patient that labs are normal. Thank you. 

## 2014-10-14 NOTE — Telephone Encounter (Signed)
Nurse called patient, reached voicemail. Left message for patient to call Brok Stocking at [phone].   

## 2014-10-15 NOTE — Telephone Encounter (Signed)
-----   Message from Enobong Amao, MD sent at 10/13/2014 10:54 PM EDT ----- Please inform the patient that labs are normal. Thank you. 

## 2014-10-16 NOTE — Telephone Encounter (Signed)
Nurse called patient, reached voicemail. Left message for patient to call Masako Overall at [phone].   

## 2014-10-16 NOTE — Telephone Encounter (Signed)
-----   Message from Enobong Amao, MD sent at 10/13/2014 10:54 PM EDT ----- Please inform the patient that labs are normal. Thank you. 

## 2014-10-17 NOTE — Telephone Encounter (Signed)
Nurse called patient, reached voicemail. Left message for patient to call Ronald Young at [phone].   

## 2014-10-17 NOTE — Telephone Encounter (Signed)
-----   Message from Enobong Amao, MD sent at 10/13/2014 10:54 PM EDT ----- Please inform the patient that labs are normal. Thank you. 

## 2014-10-20 ENCOUNTER — Ambulatory Visit: Payer: Self-pay | Admitting: Family Medicine

## 2014-10-21 NOTE — Telephone Encounter (Signed)
Nurse called patient, reached voicemail. Left message for patient to call Asalee Barrette at [phone].   

## 2014-10-21 NOTE — Telephone Encounter (Signed)
-----   Message from Enobong Amao, MD sent at 10/13/2014 10:54 PM EDT ----- Please inform the patient that labs are normal. Thank you. 

## 2014-10-22 ENCOUNTER — Encounter: Payer: Self-pay | Admitting: Cardiology

## 2014-10-22 ENCOUNTER — Ambulatory Visit: Payer: Self-pay | Attending: Cardiology | Admitting: Cardiology

## 2014-10-22 VITALS — BP 135/90 | HR 60 | Temp 98.2°F | Resp 18 | Ht 72.0 in | Wt 268.0 lb

## 2014-10-22 DIAGNOSIS — I43 Cardiomyopathy in diseases classified elsewhere: Secondary | ICD-10-CM

## 2014-10-22 DIAGNOSIS — I10 Essential (primary) hypertension: Secondary | ICD-10-CM | POA: Insufficient documentation

## 2014-10-22 DIAGNOSIS — Z87891 Personal history of nicotine dependence: Secondary | ICD-10-CM | POA: Insufficient documentation

## 2014-10-22 DIAGNOSIS — I119 Hypertensive heart disease without heart failure: Secondary | ICD-10-CM | POA: Insufficient documentation

## 2014-10-22 DIAGNOSIS — I429 Cardiomyopathy, unspecified: Secondary | ICD-10-CM | POA: Insufficient documentation

## 2014-10-22 DIAGNOSIS — I160 Hypertensive urgency: Secondary | ICD-10-CM

## 2014-10-22 MED ORDER — SPIRONOLACTONE 25 MG PO TABS: 25.0000 mg | ORAL_TABLET | Freq: Every day | ORAL | Status: DC

## 2014-10-22 NOTE — Assessment & Plan Note (Signed)
This is secondary to uncontrolled hypertension and cocaine use. I've had a long talk with the patient making clear the relationship of his weakening heart and poor control blood pressure, noncompliance, cocaine use. After our discussion, he says that he will be compliant and he is done with cocaine. He has no edema and no orthopnea so not renew his Lasix. We will start spironolactone 25 mg every morning for mortality reduction benefit. He will continue with his ACE inhibitor and beta blocker. I will see him back in 4 weeks. We will try to get an event recorder put on his symptoms possible. We'll follow-up a metabolic profile and a week on the spironolactone and ACE inhibitor to make sure he does not develop hyperkalemia.

## 2014-10-22 NOTE — Progress Notes (Signed)
[name] is a 44 year old African-American male who comes today for evaluation and management of his nonischemic cardiomyopathy. He has chronic systolic heart failure with ejection fraction 25%, dilated left ventricle, left ventricular hypertrophy, secondary to uncontrolled hypertension for 6 years and cocaine use.  He works as a Investment banker, operational at [employer]. He is very knowledgeable about diet and the importance of taking his medications. He says he's done with cocaine. He was positive for cocaine on May 22 when he presented to the hospital with syncope and dehydration. Historically, it sounds like he became orthostatic with standing up and a overheated house with no air conditioning. Arrhythmia has been entertained and there was plans to put an event monitor on him at follow-up. He has not had it placed yet.  EKG here recently showed a computer misread of WPW. Looking back it was ventricular ectopy which is quite frequent.  He denies orthopnea, peripheral edema, PND. He says he's never had any swelling. He is out of his Lasix.  His exam today shows him to be a well-developed muscular black male in no acute distress. He's no acute distress. He's very anxious. I rechecked his blood pressure with a large cuff in the right arm. He's now 135/90. It is difficult to read because of ventricular ectopy. There is no JVD. Carotids are full without bruits. Lungs are clear to auscultation. Cardiac exam reveals a nondisplaced PMI with a normal S1-S2 with frequent ectopy. There is no gallop. Abdominal exam is soft bowel sounds are present extremities reveal no edema. Pulses are intact.  EKG and all lab work reviewed.

## 2014-10-22 NOTE — Telephone Encounter (Signed)
Patient currently in clinic to be seen. Patient aware of normal labs.

## 2014-10-22 NOTE — Assessment & Plan Note (Signed)
Blood pressure much better controlled today with med compliance. We'll add spironolactone and evaluate further as an outpatient.

## 2014-10-22 NOTE — Telephone Encounter (Signed)
-----   Message from Jaclyn Shaggy, MD sent at 10/13/2014 10:54 PM EDT ----- Please inform the patient that labs are normal. Thank you.

## 2014-10-22 NOTE — Patient Instructions (Signed)
Thank you for coming in today. Spironolactone 25mg  was sent to your pharmacy.  Be sure to take this medication in the morning. Follow up in 1 month with Dr. Daleen Squibb.

## 2014-10-22 NOTE — Progress Notes (Signed)
Patient reports "I've been feeling good". Patient picked up prescriptions, furosemide not included, ran  Out of furosemide last week. Patient denies pain at this time.

## 2014-10-28 ENCOUNTER — Telehealth: Payer: Self-pay

## 2014-10-28 NOTE — Telephone Encounter (Signed)
Nurse called patient, reached voicemail. Left message for patient to call Taaliyah Delpriore with [facility], at [phone].  

## 2014-10-30 ENCOUNTER — Ambulatory Visit: Payer: Self-pay | Attending: Cardiology

## 2014-10-30 ENCOUNTER — Other Ambulatory Visit: Payer: Self-pay

## 2014-10-30 DIAGNOSIS — I10 Essential (primary) hypertension: Secondary | ICD-10-CM

## 2014-10-30 LAB — BASIC METABOLIC PANEL
BUN: 19 mg/dL (ref 6–23)
CALCIUM: 9.4 mg/dL (ref 8.4–10.5)
CHLORIDE: 106 meq/L (ref 96–112)
CO2: 27 mEq/L (ref 19–32)
Creat: 1.24 mg/dL (ref 0.50–1.35)
Glucose, Bld: 93 mg/dL (ref 70–99)
Potassium: 5.1 mEq/L (ref 3.5–5.3)
SODIUM: 144 meq/L (ref 135–145)

## 2014-10-30 MED ORDER — LISINOPRIL 40 MG PO TABS: 40.0000 mg | ORAL_TABLET | Freq: Every day | ORAL | Status: DC

## 2014-10-30 MED ORDER — CARVEDILOL 6.25 MG PO TABS: 6.2500 mg | ORAL_TABLET | Freq: Two times a day (BID) | ORAL | Status: DC

## 2014-10-30 NOTE — Progress Notes (Signed)
Patient came in for labs today. Nurse spoke to patient about Dr. Daleen Squibb wanting patient to wear heart monitor. Nurse gave patient heart monitor hard ship form and instructed patient to deliver form to [facility] at address listed on application. Patient aware of [facility] located across from [place] on [address]. Patient requested nurse to send lisinopril prescription to [facility] so all medications will be in the same pharmacy. Patient requested refill on carvedilol. Prescription was written to take BID but was written for 30 pills with 2 refills. Nurse spoke to Dr. Venetia Night. Dr. Venetia Night advised nurse to send refill for lisinopril and correct prescription for carvedilol and send both to [facility].  Nurse sent prescriptions to [facility].

## 2014-11-16 ENCOUNTER — Encounter (HOSPITAL_COMMUNITY): Payer: Self-pay | Admitting: *Deleted

## 2014-11-16 ENCOUNTER — Emergency Department (HOSPITAL_COMMUNITY)
Admission: EM | Admit: 2014-11-16 | Discharge: 2014-11-16 | Disposition: A | Discharge status: Home / Self Care | Payer: Self-pay | Attending: Emergency Medicine | Admitting: Emergency Medicine

## 2014-11-16 DIAGNOSIS — Y998 Other external cause status: Secondary | ICD-10-CM | POA: Insufficient documentation

## 2014-11-16 DIAGNOSIS — I10 Essential (primary) hypertension: Secondary | ICD-10-CM | POA: Insufficient documentation

## 2014-11-16 DIAGNOSIS — Y93F2 Activity, caregiving, lifting: Secondary | ICD-10-CM | POA: Insufficient documentation

## 2014-11-16 DIAGNOSIS — M1990 Unspecified osteoarthritis, unspecified site: Secondary | ICD-10-CM | POA: Insufficient documentation

## 2014-11-16 DIAGNOSIS — X58XXXA Exposure to other specified factors, initial encounter: Secondary | ICD-10-CM | POA: Insufficient documentation

## 2014-11-16 DIAGNOSIS — Z87891 Personal history of nicotine dependence: Secondary | ICD-10-CM | POA: Insufficient documentation

## 2014-11-16 DIAGNOSIS — S39012A Strain of muscle, fascia and tendon of lower back, initial encounter: Secondary | ICD-10-CM | POA: Insufficient documentation

## 2014-11-16 DIAGNOSIS — G8929 Other chronic pain: Secondary | ICD-10-CM | POA: Insufficient documentation

## 2014-11-16 DIAGNOSIS — Y9289 Other specified places as the place of occurrence of the external cause: Secondary | ICD-10-CM | POA: Insufficient documentation

## 2014-11-16 DIAGNOSIS — Z79899 Other long term (current) drug therapy: Secondary | ICD-10-CM | POA: Insufficient documentation

## 2014-11-16 NOTE — ED Notes (Signed)
Pt reports heavy lifting on Friday and now has lower back pain. Hx of same.

## 2014-11-16 NOTE — Discharge Instructions (Signed)
You may take ibuprofen, Tylenol or naproxen for pain. Follow-up with your primary care physician. I recommend resting while you're at home, applying ice and heat intermittently to your back. Avoid heavy lifting over 25 pounds for the next 2-3 days.  Back Pain, Adult Low back pain is very common. About 1 in 5 people have back pain.The cause of low back pain is rarely dangerous. The pain often gets better over time.About half of people with a sudden onset of back pain feel better in just 2 weeks. About 8 in 10 people feel better by 6 weeks.  CAUSES Some common causes of back pain include:  Strain of the muscles or ligaments supporting the spine.  Wear and tear (degeneration) of the spinal discs.  Arthritis.  Direct injury to the back. DIAGNOSIS Most of the time, the direct cause of low back pain is not known.However, back pain can be treated effectively even when the exact cause of the pain is unknown.Answering your caregiver's questions about your overall health and symptoms is one of the most accurate ways to make sure the cause of your pain is not dangerous. If your caregiver needs more information, he or she may order lab work or imaging tests (X-rays or MRIs).However, even if imaging tests show changes in your back, this usually does not require surgery. HOME CARE INSTRUCTIONS For many people, back pain returns.Since low back pain is rarely dangerous, it is often a condition that people can learn to [facility] their own.   Remain active. It is stressful on the back to sit or stand in one place. Do not sit, drive, or stand in one place for more than 30 minutes at a time. Take short walks on level surfaces as soon as pain allows.Try to increase the length of time you walk each day.  Do not stay in bed.Resting more than 1 or 2 days can delay your recovery.  Do not avoid exercise or work.Your body is made to move.It is not dangerous to be active, even though your back may hurt.Your  back will likely heal faster if you return to being active before your pain is gone.  Pay attention to your body when you bend and lift. Many people have less discomfortwhen lifting if they bend their knees, keep the load close to their bodies,and avoid twisting. Often, the most comfortable positions are those that put less stress on your recovering back.  Find a comfortable position to sleep. Use a firm mattress and lie on your side with your knees slightly bent. If you lie on your back, put a pillow under your knees.  Only take over-the-counter or prescription medicines as directed by your caregiver. Over-the-counter medicines to reduce pain and inflammation are often the most helpful.Your caregiver may prescribe muscle relaxant drugs.These medicines help dull your pain so you can more quickly return to your normal activities and healthy exercise.  Put ice on the injured area.  Put ice in a plastic bag.  Place a towel between your skin and the bag.  Leave the ice on for 15-20 minutes, 03-04 times a day for the first 2 to 3 days. After that, ice and heat may be alternated to reduce pain and spasms.  Ask your caregiver about trying back exercises and gentle massage. This may be of some benefit.  Avoid feeling anxious or stressed.Stress increases muscle tension and can worsen back pain.It is important to recognize when you are anxious or stressed and learn ways to manage it.Exercise is a  great option. SEEK MEDICAL CARE IF:  You have pain that is not relieved with rest or medicine.  You have pain that does not improve in 1 week.  You have new symptoms.  You are generally not feeling well. SEEK IMMEDIATE MEDICAL CARE IF:   You have pain that radiates from your back into your legs.  You develop new bowel or bladder control problems.  You have unusual weakness or numbness in your arms or legs.  You develop nausea or vomiting.  You develop abdominal pain.  You feel  faint. Document Released: 04/18/2005 Document Revised: 10/18/2011 Document Reviewed: 08/20/2013 [facility] Patient Information 2015 [place], [place]. This information is not intended to replace advice given to you by your health care provider. Make sure you discuss any questions you have with your health care provider.  Lumbosacral Strain Lumbosacral strain is a strain of any of the parts that make up your lumbosacral vertebrae. Your lumbosacral vertebrae are the bones that make up the lower third of your backbone. Your lumbosacral vertebrae are held together by muscles and tough, fibrous tissue (ligaments).  CAUSES  A sudden blow to your back can cause lumbosacral strain. Also, anything that causes an excessive stretch of the muscles in the low back can cause this strain. This is typically seen when people exert themselves strenuously, fall, lift heavy objects, bend, or crouch repeatedly. RISK FACTORS  Physically demanding work.  Participation in pushing or pulling sports or sports that require a sudden twist of the back (tennis, golf, baseball).  Weight lifting.  Excessive lower back curvature.  Forward-tilted pelvis.  Weak back or abdominal muscles or both.  Tight hamstrings. SIGNS AND SYMPTOMS  Lumbosacral strain may cause pain in the area of your injury or pain that moves (radiates) down your leg.  DIAGNOSIS Your health care provider can often diagnose lumbosacral strain through a physical exam. In some cases, you may need tests such as X-ray exams.  TREATMENT  Treatment for your lower back injury depends on many factors that your clinician will have to evaluate. However, most treatment will include the use of anti-inflammatory medicines. HOME CARE INSTRUCTIONS   Avoid hard physical activities (tennis, racquetball, waterskiing) if you are not in proper physical condition for it. This may aggravate or create problems.  If you have a back problem, avoid sports requiring sudden body  movements. Swimming and walking are generally safer activities.  Maintain good posture.  Maintain a healthy weight.  For acute conditions, you may put ice on the injured area.  Put ice in a plastic bag.  Place a towel between your skin and the bag.  Leave the ice on for 20 minutes, 2-3 times a day.  When the low back starts healing, stretching and strengthening exercises may be recommended. SEEK MEDICAL CARE IF:  Your back pain is getting worse.  You experience severe back pain not relieved with medicines. SEEK IMMEDIATE MEDICAL CARE IF:   You have numbness, tingling, weakness, or problems with the use of your arms or legs.  There is a change in bowel or bladder control.  You have increasing pain in any area of the body, including your belly (abdomen).  You notice shortness of breath, dizziness, or feel faint.  You feel sick to your stomach (nauseous), are throwing up (vomiting), or become sweaty.  You notice discoloration of your toes or legs, or your feet get very cold. MAKE SURE YOU:   Understand these instructions.  Will watch your condition.  Will get help  right away if you are not doing well or get worse. Document Released: 01/26/2005 Document Revised: 04/23/2013 Document Reviewed: 12/05/2012 [facility] Patient Information 2015 [place], [place]. This information is not intended to replace advice given to you by your health care provider. Make sure you discuss any questions you have with your health care provider.

## 2014-11-16 NOTE — ED Notes (Signed)
Patient did not read the intructions.  He also refused to sign the discharge.  Patient was able to walk out with no problems.

## 2014-11-16 NOTE — ED Provider Notes (Signed)
CSN: [id number]     Arrival date & time 11/16/14  1020 History  This chart was scribed for Celene Skeen, PA-C, working with Nelva Nay, MD by Chestine Spore, ED Scribe. The patient was seen in room TR07C/TR07C at 11:42 AM.     Chief Complaint  Patient presents with  . Back Pain      The history is provided by the patient. No language interpreter was used.    HPI Comments: Ronald Young is a 44 y.o. male with a medical hx of HTN and chronic back pain who presents to the Emergency Department complaining of intermittent low back pain onset 3 days ago. Pt notes that he was doing some heavy lifting and he now has low back pain. Pt notes that he felt a slight mild pull to his back during the lifting. He reports that the back pain does not radiate to his legs. Pt notes that his back pain is worsened by sitting too long. He states that he has tried tylenol back and body with some relief for his symptoms. Pt denies bowel/bladder incontinence, fever, color change, wound, rash, and any other symptoms.    Past Medical History  Diagnosis Date  . Arthritis   . Hypertension   . Cardiomyopathy 09/22/2014   History reviewed. No pertinent past surgical history. Family History  Problem Relation Age of Onset  . Hypertension Mother   . Colon cancer Other    History  Substance Use Topics  . Smoking status: Former Smoker    Types: Cigarettes  . Smokeless tobacco: Not on file  . Alcohol Use: Yes     Comment: "drank a couple beers last weekend"    Review of Systems  Constitutional: Negative for fever.  Gastrointestinal:       No bowel incontinence  Genitourinary:       No bladder incontinence  Musculoskeletal: Positive for back pain. Negative for gait problem.  Skin: Negative for color change, rash and wound.  Neurological: Negative for weakness and numbness.       No tingling      Allergies  Strawberry  Home Medications   Prior to Admission medications   Medication Sig Start Date End  Date Taking? Authorizing Provider  acetaminophen (TYLENOL) 500 MG tablet Take 1 tablet (500 mg total) by mouth every 6 (six) hours as needed for mild pain or headache (pain). 09/23/14   Zannie Cove, MD  carvedilol (COREG) 6.25 MG tablet Take 1 tablet (6.25 mg total) by mouth 2 (two) times daily with a meal. 10/30/14   Jaclyn Shaggy, MD  furosemide (LASIX) 20 MG tablet Take 1 tablet (20 mg total) by mouth daily. Patient not taking: Reported on 10/22/2014 09/23/14   Zannie Cove, MD  lisinopril (PRINIVIL,ZESTRIL) 40 MG tablet Take 1 tablet (40 mg total) by mouth daily. 10/30/14   Jaclyn Shaggy, MD  spironolactone (ALDACTONE) 25 MG tablet Take 1 tablet (25 mg total) by mouth daily. 10/22/14   Gaylord Shih, MD   BP 143/83 mmHg  Pulse 79  Temp(Src) 98.4 F (36.9 C) (Oral)  Resp 18  SpO2 100% Physical Exam  Constitutional: He is oriented to person, place, and time. He appears well-developed and well-nourished. No distress.  HENT:  Head: Normocephalic and atraumatic.  Mouth/Throat: Oropharynx is clear and moist.  Eyes: Conjunctivae and EOM are normal.  Neck: Normal range of motion. Neck supple. No spinous process tenderness and no muscular tenderness present. No tracheal deviation present.  Cardiovascular: Normal rate, regular rhythm and  normal heart sounds.   Pulmonary/Chest: Effort normal and breath sounds normal. No respiratory distress.  Musculoskeletal: Normal range of motion. He exhibits no edema.  TTP lumbar and thoracic paraspinal muscles. No spinous process tenderness. Full range of motion.  Neurological: He is alert and oriented to person, place, and time. He has normal strength.  Strength lower extremities 5/5 and equal bilateral. Sensation intact. Normal gait.  Skin: Skin is warm and dry. No rash noted. He is not diaphoretic.  Psychiatric: He has a normal mood and affect. His behavior is normal.  Nursing note and vitals reviewed.   ED Course  Procedures (including critical care  time) DIAGNOSTIC STUDIES: Oxygen Saturation is 100% on RA, nl by my interpretation.    COORDINATION OF CARE: 11:45 AM-Discussed treatment plan which includes anti-inflammatory Rx, alternate warm compress and ice  with pt at bedside and pt agreed to plan.   11:48 AM- Pt reports that his "back hurts but it is not that serious" and the pt states "I just want to rest and take a couple days off if I'm going to be honest." After discussing that I would be more than happy to give light duty for the next 2-3 days, pt states "You don't have to give me shit, just let me go".  Labs Review Labs Reviewed - No data to display  Imaging Review No results found.   EKG Interpretation None      MDM   Final diagnoses:  Lumbar strain, initial encounter   No red flags concerning patient's back pain. No s/s of central cord compression or cauda equina. Lower extremities are neurovascularly intact and patient is ambulating without difficulty. Advised against heavy lifting for the next 2-3 days, ice/heat, NSAIDs. Stable for discharge. Return precautions given.  I personally performed the services described in this documentation, which was scribed in my presence. The recorded information has been reviewed and considered.    Kathrynn Speed, PA-C 11/16/14 1159  Nelva Nay, MD 11/17/14 [phone]

## 2014-11-27 ENCOUNTER — Ambulatory Visit: Payer: Self-pay

## 2019-12-18 ENCOUNTER — Emergency Department (HOSPITAL_COMMUNITY): Payer: Self-pay

## 2019-12-18 ENCOUNTER — Emergency Department (HOSPITAL_COMMUNITY)
Admission: EM | Admit: 2019-12-18 | Discharge: 2019-12-19 | Disposition: A | Discharge status: Home / Self Care | Payer: Self-pay | Attending: Emergency Medicine | Admitting: Emergency Medicine

## 2019-12-18 ENCOUNTER — Other Ambulatory Visit: Payer: Self-pay

## 2019-12-18 DIAGNOSIS — Z20822 Contact with and (suspected) exposure to covid-19: Secondary | ICD-10-CM | POA: Insufficient documentation

## 2019-12-18 DIAGNOSIS — Z5321 Procedure and treatment not carried out due to patient leaving prior to being seen by health care provider: Secondary | ICD-10-CM | POA: Insufficient documentation

## 2019-12-18 DIAGNOSIS — R0602 Shortness of breath: Secondary | ICD-10-CM | POA: Insufficient documentation

## 2019-12-18 LAB — CBC
HCT: 42.8 % (ref 39.0–52.0)
Hemoglobin: 13.7 g/dL (ref 13.0–17.0)
MCH: 27.3 pg (ref 26.0–34.0)
MCHC: 32 g/dL (ref 30.0–36.0)
MCV: 85.4 fL (ref 80.0–100.0)
Platelets: 381 10*3/uL (ref 150–400)
RBC: 5.01 MIL/uL (ref 4.22–5.81)
RDW: 14.9 % (ref 11.5–15.5)
WBC: 6.6 10*3/uL (ref 4.0–10.5)
nRBC: 0 % (ref 0.0–0.2)

## 2019-12-18 LAB — SARS CORONAVIRUS 2 BY RT PCR (HOSPITAL ORDER, PERFORMED IN ~~LOC~~ HOSPITAL LAB): SARS Coronavirus 2: NEGATIVE

## 2019-12-18 LAB — BASIC METABOLIC PANEL
Anion gap: 11 (ref 5–15)
BUN: 14 mg/dL (ref 6–20)
CO2: 22 mmol/L (ref 22–32)
Calcium: 9.3 mg/dL (ref 8.9–10.3)
Chloride: 107 mmol/L (ref 98–111)
Creatinine, Ser: 1.35 mg/dL — ABNORMAL HIGH (ref 0.61–1.24)
GFR calc Af Amer: >60 mL/min (ref >60)
GFR calc non Af Amer: >60 mL/min (ref >60)
Glucose, Bld: 142 mg/dL — ABNORMAL HIGH (ref 70–99)
Potassium: 4.2 mmol/L (ref 3.5–5.1)
Sodium: 140 mmol/L (ref 135–145)

## 2019-12-18 IMAGING — DX DG CHEST 2V
2 series · 2 of 2 positions shown · non-contrast
Comparison: Chest radiograph dated [DATE].

CLINICAL DATA: 49-year-old male with shortness of breath.

EXAM:
CHEST - 2 VIEW

[chest pa]
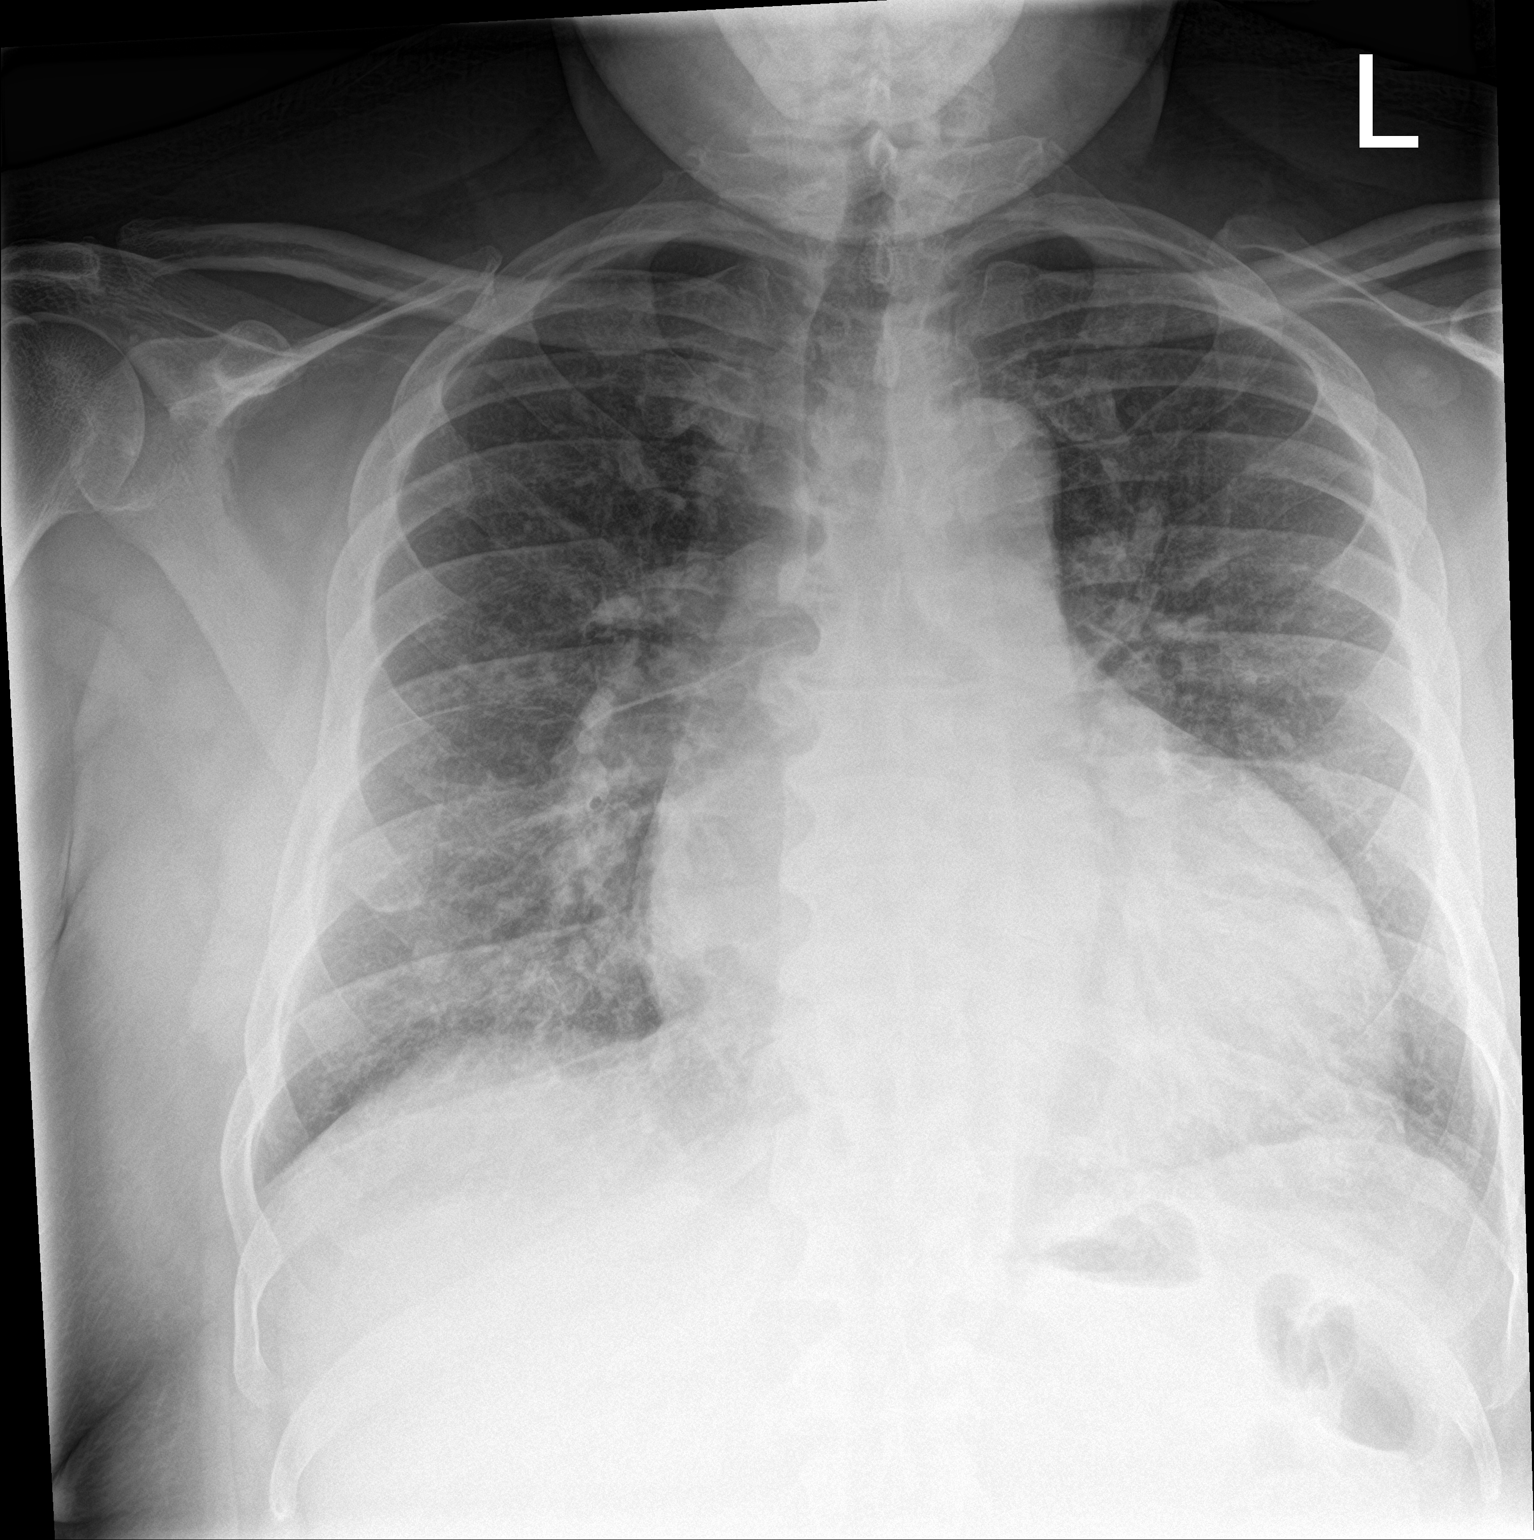

[chest lat]
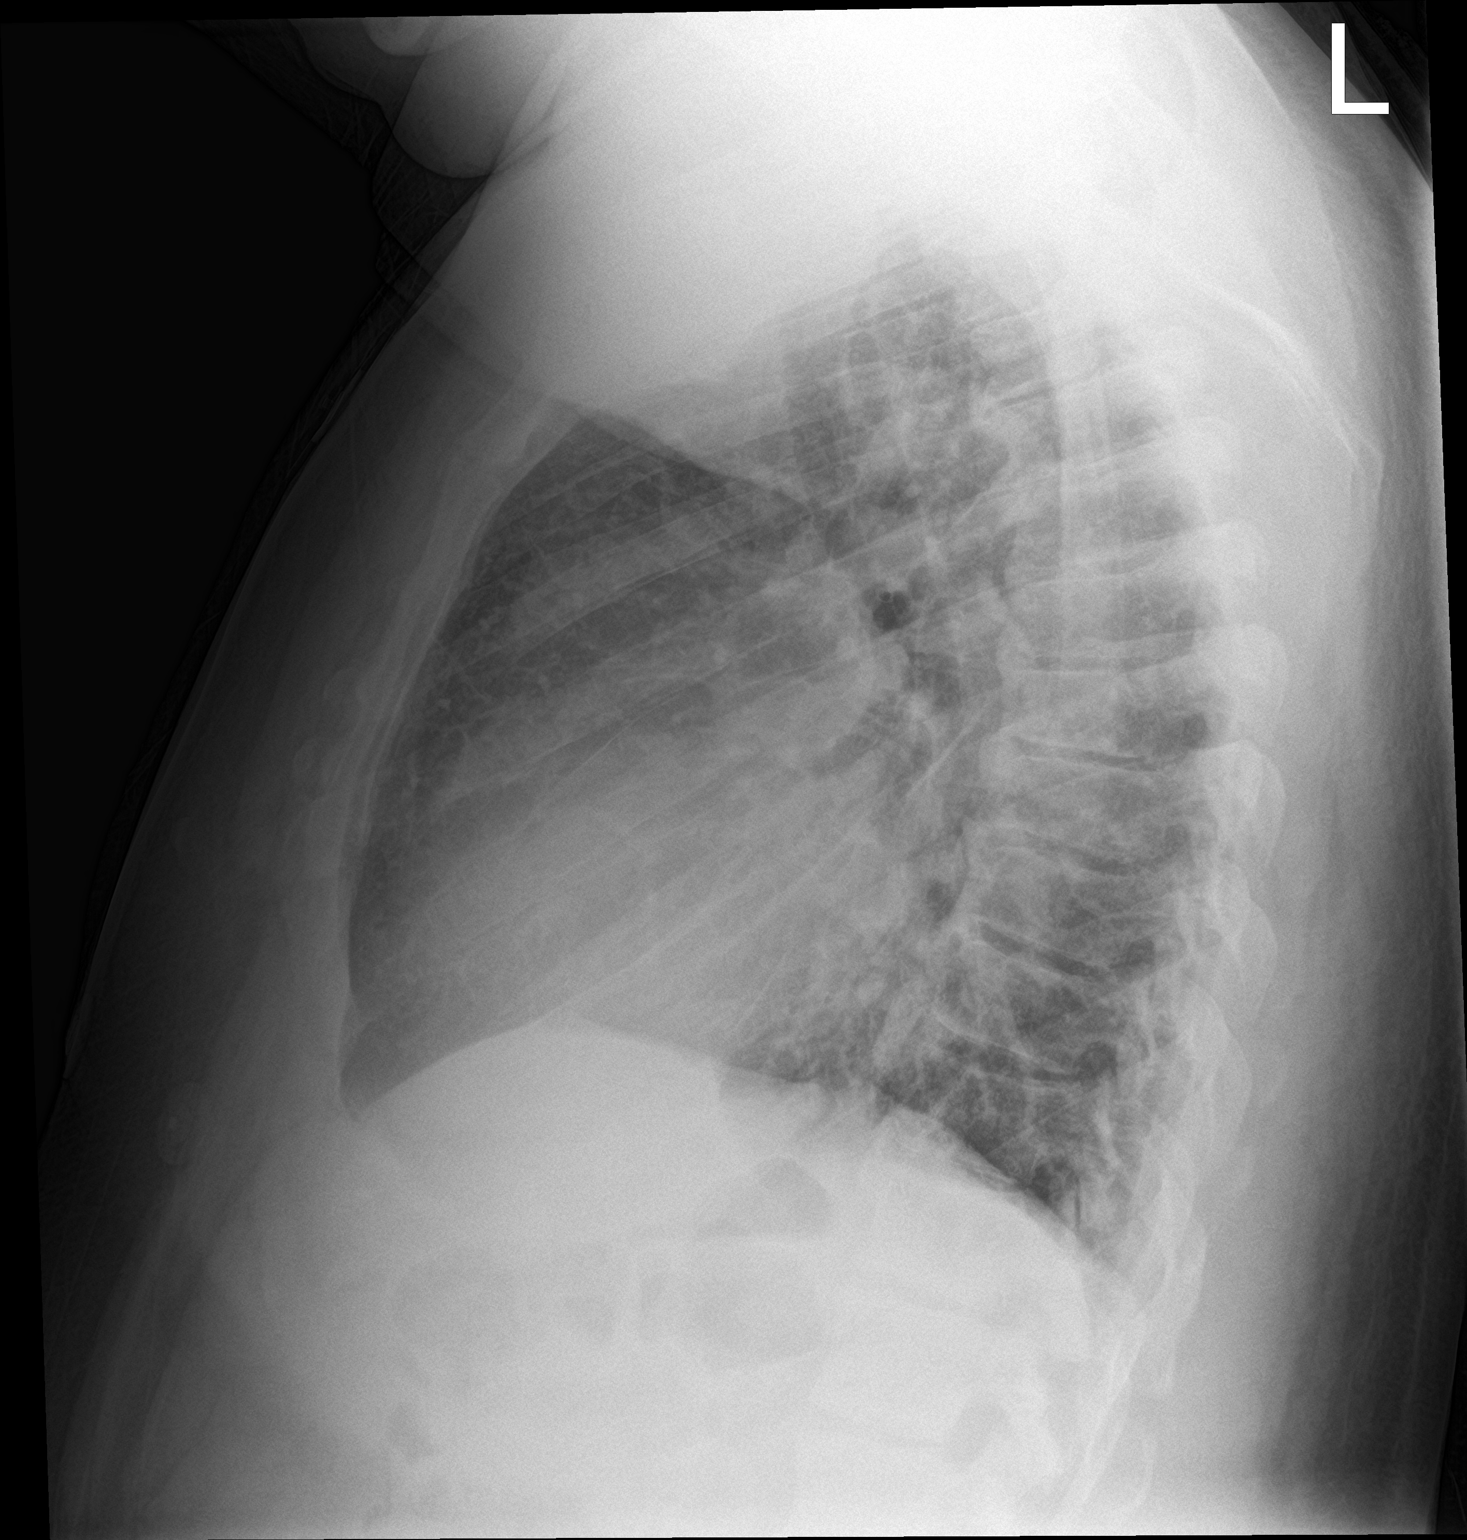

[2 of 2 positions shown; findings below may reference images not displayed]

FINDINGS: There is cardiomegaly with mild vascular congestion, new since the
prior radiograph. No focal consolidation, pleural effusion or
pneumothorax. No acute osseous pathology.
IMPRESSION: Cardiomegaly with mild vascular congestion. No focal consolidation.

## 2019-12-18 NOTE — ED Triage Notes (Signed)
Pt reports developing intermittent shortness of breath one day after receiving second covid vaccine two weeks ago.

## 2019-12-18 NOTE — ED Notes (Signed)
Pt having back pain.

## 2019-12-19 NOTE — ED Notes (Signed)
Pt stated he was stepping outside to eat and would be back within 30 min.

## 2019-12-20 ENCOUNTER — Inpatient Hospital Stay (HOSPITAL_COMMUNITY)
Admission: EM | Admit: 2019-12-20 | Discharge: 2019-12-24 | DRG: 287 | Disposition: A | Discharge status: Home / Self Care | Payer: Self-pay | Attending: Family Medicine | Admitting: Family Medicine

## 2019-12-20 ENCOUNTER — Encounter (HOSPITAL_COMMUNITY): Payer: Self-pay | Admitting: Emergency Medicine

## 2019-12-20 ENCOUNTER — Other Ambulatory Visit: Payer: Self-pay

## 2019-12-20 ENCOUNTER — Emergency Department (HOSPITAL_COMMUNITY): Payer: Self-pay

## 2019-12-20 DIAGNOSIS — Z79899 Other long term (current) drug therapy: Secondary | ICD-10-CM

## 2019-12-20 DIAGNOSIS — Z800 Family history of malignant neoplasm of digestive organs: Secondary | ICD-10-CM

## 2019-12-20 DIAGNOSIS — I160 Hypertensive urgency: Secondary | ICD-10-CM

## 2019-12-20 DIAGNOSIS — Z8249 Family history of ischemic heart disease and other diseases of the circulatory system: Secondary | ICD-10-CM

## 2019-12-20 DIAGNOSIS — Z87898 Personal history of other specified conditions: Secondary | ICD-10-CM

## 2019-12-20 DIAGNOSIS — I248 Other forms of acute ischemic heart disease: Secondary | ICD-10-CM | POA: Diagnosis present

## 2019-12-20 DIAGNOSIS — I161 Hypertensive emergency: Secondary | ICD-10-CM | POA: Diagnosis present

## 2019-12-20 DIAGNOSIS — Z20822 Contact with and (suspected) exposure to covid-19: Secondary | ICD-10-CM | POA: Diagnosis present

## 2019-12-20 DIAGNOSIS — I509 Heart failure, unspecified: Secondary | ICD-10-CM

## 2019-12-20 DIAGNOSIS — Z87891 Personal history of nicotine dependence: Secondary | ICD-10-CM

## 2019-12-20 DIAGNOSIS — I10 Essential (primary) hypertension: Secondary | ICD-10-CM

## 2019-12-20 DIAGNOSIS — I5043 Acute on chronic combined systolic (congestive) and diastolic (congestive) heart failure: Secondary | ICD-10-CM | POA: Diagnosis present

## 2019-12-20 DIAGNOSIS — N179 Acute kidney failure, unspecified: Secondary | ICD-10-CM | POA: Diagnosis present

## 2019-12-20 DIAGNOSIS — I428 Other cardiomyopathies: Secondary | ICD-10-CM | POA: Diagnosis present

## 2019-12-20 DIAGNOSIS — I110 Hypertensive heart disease with heart failure: Principal | ICD-10-CM | POA: Diagnosis present

## 2019-12-20 DIAGNOSIS — R778 Other specified abnormalities of plasma proteins: Secondary | ICD-10-CM

## 2019-12-20 DIAGNOSIS — E669 Obesity, unspecified: Secondary | ICD-10-CM | POA: Diagnosis present

## 2019-12-20 DIAGNOSIS — R7989 Other specified abnormal findings of blood chemistry: Secondary | ICD-10-CM

## 2019-12-20 DIAGNOSIS — M1990 Unspecified osteoarthritis, unspecified site: Secondary | ICD-10-CM | POA: Diagnosis present

## 2019-12-20 DIAGNOSIS — Z9119 Patient's noncompliance with other medical treatment and regimen: Secondary | ICD-10-CM

## 2019-12-20 DIAGNOSIS — R739 Hyperglycemia, unspecified: Secondary | ICD-10-CM | POA: Diagnosis present

## 2019-12-20 LAB — RAPID URINE DRUG SCREEN, HOSP PERFORMED
Amphetamines: NOT DETECTED
Barbiturates: NOT DETECTED
Benzodiazepines: NOT DETECTED
Cocaine: NOT DETECTED
Opiates: NOT DETECTED
Tetrahydrocannabinol: NOT DETECTED

## 2019-12-20 LAB — HEPATIC FUNCTION PANEL
ALT: 43 U/L (ref 0–44)
AST: 31 U/L (ref 15–41)
Albumin: 3.7 g/dL (ref 3.5–5.0)
Alkaline Phosphatase: 78 U/L (ref 38–126)
Bilirubin, Direct: 0.2 mg/dL (ref 0.0–0.2)
Indirect Bilirubin: 0.7 mg/dL (ref 0.3–0.9)
Total Bilirubin: 0.9 mg/dL (ref 0.3–1.2)
Total Protein: 7.5 g/dL (ref 6.5–8.1)

## 2019-12-20 LAB — CBC WITH DIFFERENTIAL/PLATELET
Abs Immature Granulocytes: 0.04 10*3/uL (ref 0.00–0.07)
Basophils Absolute: 0.1 10*3/uL (ref 0.0–0.1)
Basophils Relative: 1 %
Eosinophils Absolute: 0.2 10*3/uL (ref 0.0–0.5)
Eosinophils Relative: 2 %
HCT: 43.5 % (ref 39.0–52.0)
Hemoglobin: 14 g/dL (ref 13.0–17.0)
Immature Granulocytes: 1 %
Lymphocytes Relative: 25 %
Lymphs Abs: 2.2 10*3/uL (ref 0.7–4.0)
MCH: 27.8 pg (ref 26.0–34.0)
MCHC: 32.2 g/dL (ref 30.0–36.0)
MCV: 86.5 fL (ref 80.0–100.0)
Monocytes Absolute: 0.5 10*3/uL (ref 0.1–1.0)
Monocytes Relative: 6 %
Neutro Abs: 5.7 10*3/uL (ref 1.7–7.7)
Neutrophils Relative %: 65 %
Platelets: 367 10*3/uL (ref 150–400)
RBC: 5.03 MIL/uL (ref 4.22–5.81)
RDW: 14.9 % (ref 11.5–15.5)
WBC: 8.7 10*3/uL (ref 4.0–10.5)
nRBC: 0 % (ref 0.0–0.2)

## 2019-12-20 LAB — BASIC METABOLIC PANEL
Anion gap: 10 (ref 5–15)
BUN: 16 mg/dL (ref 6–20)
CO2: 23 mmol/L (ref 22–32)
Calcium: 9.1 mg/dL (ref 8.9–10.3)
Chloride: 106 mmol/L (ref 98–111)
Creatinine, Ser: 1.35 mg/dL — ABNORMAL HIGH (ref 0.61–1.24)
GFR calc Af Amer: >60 mL/min (ref >60)
GFR calc non Af Amer: >60 mL/min (ref >60)
Glucose, Bld: 120 mg/dL — ABNORMAL HIGH (ref 70–99)
Potassium: 3.6 mmol/L (ref 3.5–5.1)
Sodium: 139 mmol/L (ref 135–145)

## 2019-12-20 LAB — TROPONIN I (HIGH SENSITIVITY)
Troponin I (High Sensitivity): 147 ng/L (ref <18)
Troponin I (High Sensitivity): 125 ng/L (ref <18)

## 2019-12-20 LAB — BRAIN NATRIURETIC PEPTIDE: B Natriuretic Peptide: 984.2 pg/mL — ABNORMAL HIGH (ref 0.0–100.0)

## 2019-12-20 LAB — HIV ANTIBODY (ROUTINE TESTING W REFLEX): HIV Screen 4th Generation wRfx: NONREACTIVE

## 2019-12-20 LAB — SARS CORONAVIRUS 2 BY RT PCR (HOSPITAL ORDER, PERFORMED IN ~~LOC~~ HOSPITAL LAB): SARS Coronavirus 2: NEGATIVE

## 2019-12-20 IMAGING — CR DG CHEST 2V
2 series · 2 of 2 positions shown · non-contrast
Comparison: [DATE]

CLINICAL DATA: Shortness of breath after exertion.

EXAM:
CHEST - 2 VIEW

[w chest pa]
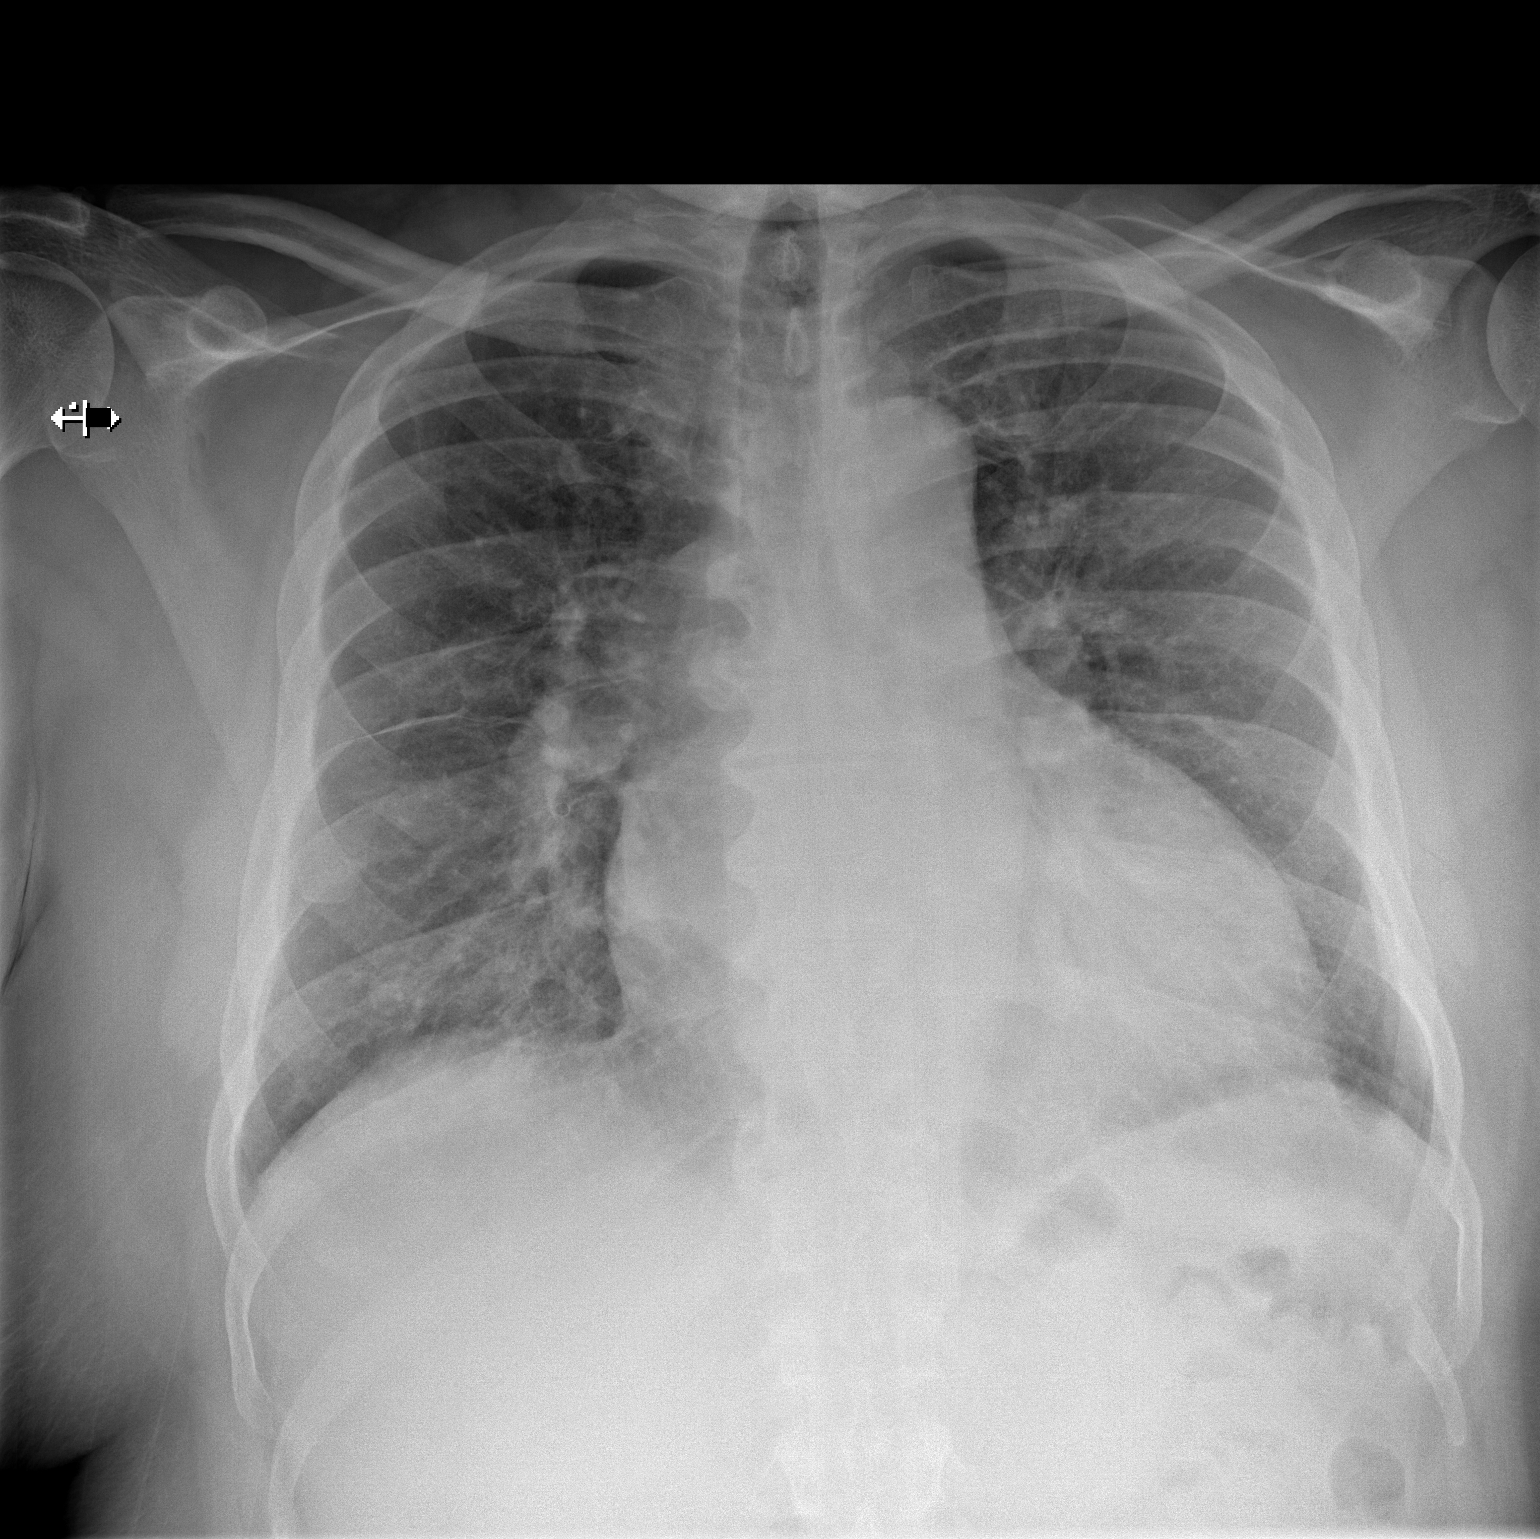

[w chest lat]
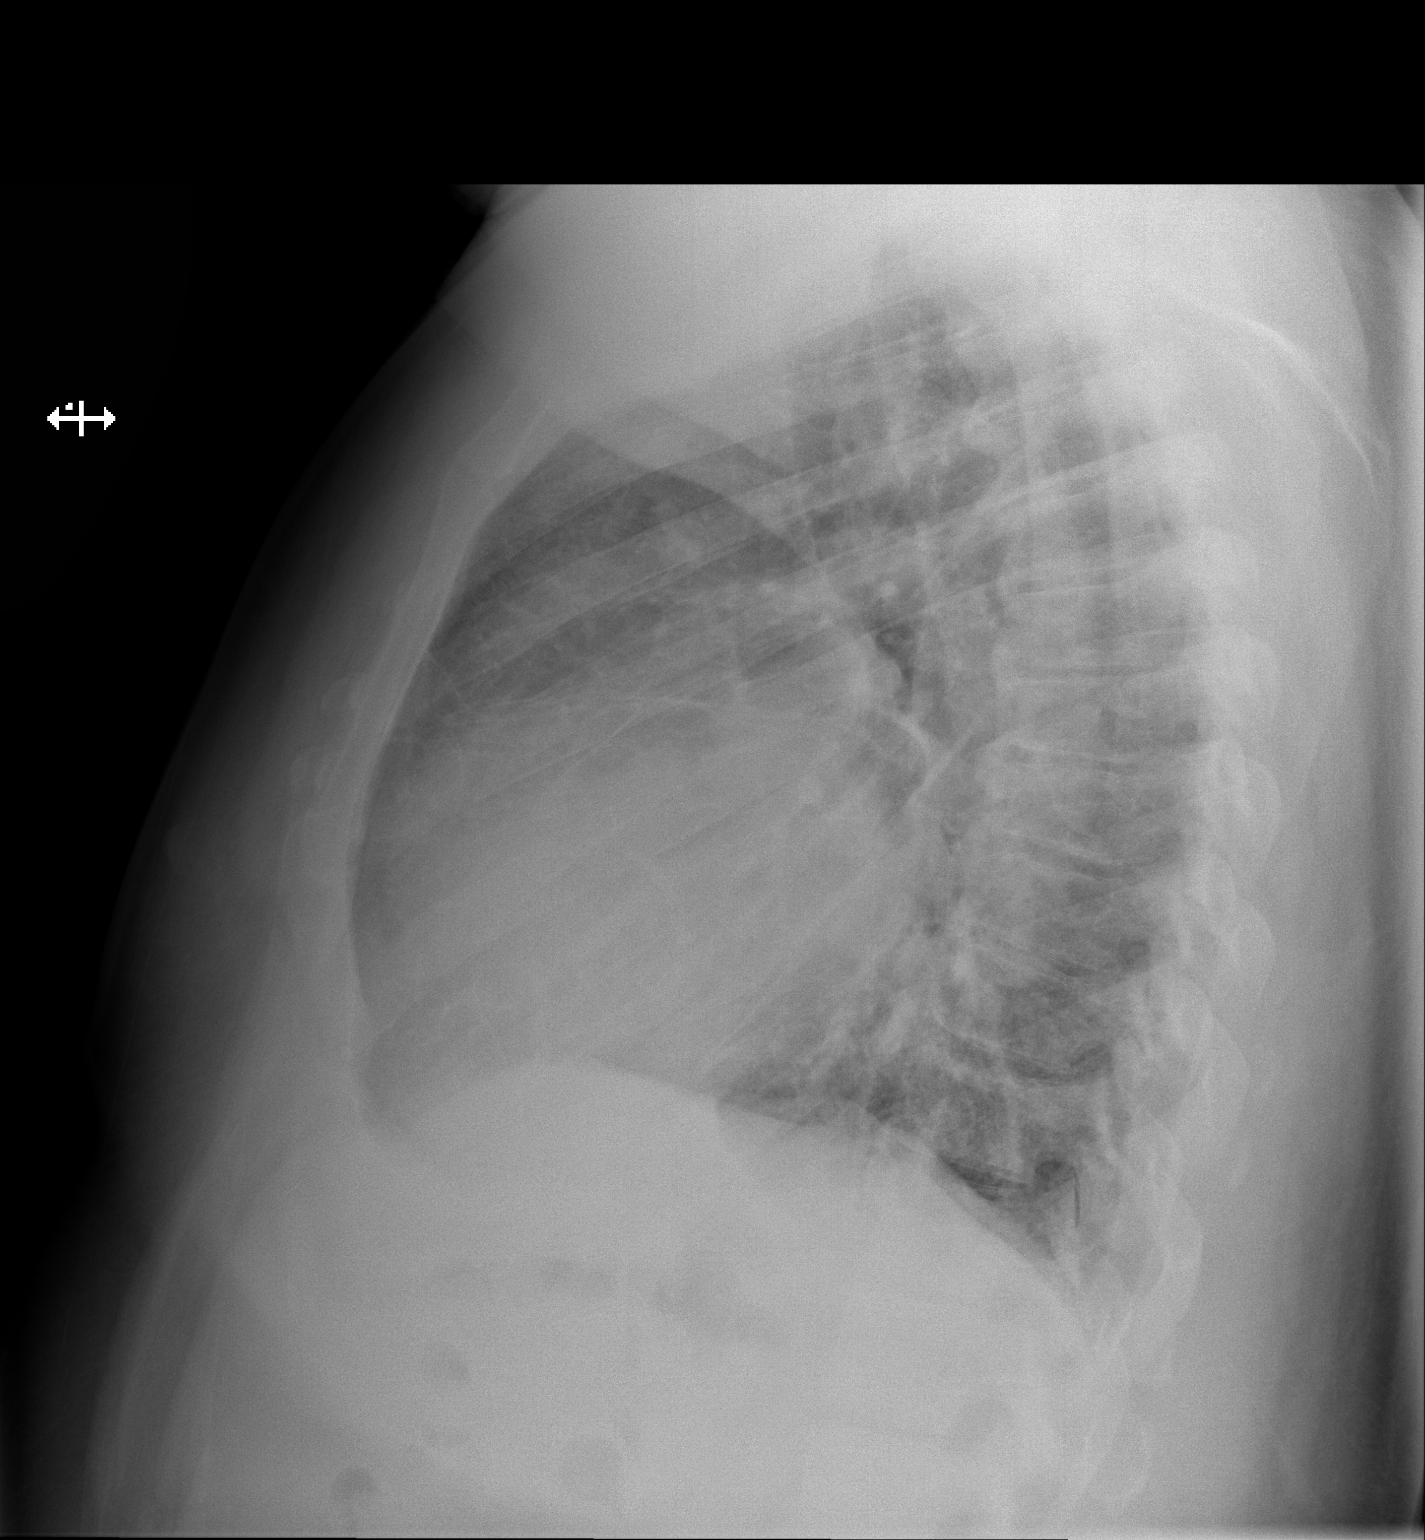

[2 of 2 positions shown; findings below may reference images not displayed]

FINDINGS: Enlarged cardiopericardial silhouette, similar to prior. Mild
vascular congestion. No pleural effusions. No discernible
pneumothorax. No consolidation. No acute osseous abnormality.
IMPRESSION: Similar cardiopericardial silhouette enlargement and mild vascular
congestion. No focal consolidation.

## 2019-12-20 MED ORDER — FUROSEMIDE 10 MG/ML IJ SOLN
40.0000 mg | Freq: Once | INTRAMUSCULAR | Status: AC
  Administered 2019-12-20: 40 mg via INTRAVENOUS
  Filled 2019-12-20: qty 4

## 2019-12-20 MED ORDER — ENOXAPARIN SODIUM 40 MG/0.4ML ~~LOC~~ SOLN
40.0000 mg | SUBCUTANEOUS | Status: DC
  Administered 2019-12-20 – 2019-12-23 (×2): 40 mg via SUBCUTANEOUS
  Filled 2019-12-20 (×2): qty 0.4

## 2019-12-20 MED ORDER — LABETALOL HCL 5 MG/ML IV SOLN: 10.0000 mg | Freq: Once | INTRAVENOUS | Status: DC

## 2019-12-20 MED ORDER — LABETALOL HCL 5 MG/ML IV SOLN
10.0000 mg | Freq: Once | INTRAVENOUS | Status: AC
  Administered 2019-12-20: 10 mg via INTRAVENOUS
  Filled 2019-12-20: qty 4

## 2019-12-20 MED ORDER — SODIUM CHLORIDE 0.9 % IV SOLN: 250.0000 mL | INTRAVENOUS | Status: DC | PRN

## 2019-12-20 MED ORDER — LABETALOL HCL 5 MG/ML IV SOLN
10.0000 mg | INTRAVENOUS | Status: DC | PRN
  Administered 2019-12-20 – 2019-12-21 (×3): 10 mg via INTRAVENOUS
  Filled 2019-12-20 (×3): qty 4

## 2019-12-20 MED ORDER — ONDANSETRON HCL 4 MG/2ML IJ SOLN: 4.0000 mg | Freq: Four times a day (QID) | INTRAMUSCULAR | Status: DC | PRN

## 2019-12-20 MED ORDER — FUROSEMIDE 10 MG/ML IJ SOLN: 40.0000 mg | Freq: Two times a day (BID) | INTRAMUSCULAR | Status: DC

## 2019-12-20 MED ORDER — SODIUM CHLORIDE 0.9% FLUSH: 3.0000 mL | INTRAVENOUS | Status: DC | PRN

## 2019-12-20 MED ORDER — ACETAMINOPHEN 325 MG PO TABS: 650.0000 mg | ORAL_TABLET | ORAL | Status: DC | PRN

## 2019-12-20 MED ORDER — SODIUM CHLORIDE 0.9% FLUSH
3.0000 mL | Freq: Two times a day (BID) | INTRAVENOUS | Status: DC
  Administered 2019-12-21 – 2019-12-24 (×5): 3 mL via INTRAVENOUS

## 2019-12-20 MED ORDER — CARVEDILOL 12.5 MG PO TABS
12.5000 mg | ORAL_TABLET | Freq: Two times a day (BID) | ORAL | Status: DC
  Administered 2019-12-20: 12.5 mg via ORAL
  Filled 2019-12-20: qty 1

## 2019-12-20 MED ORDER — SPIRONOLACTONE 25 MG PO TABS
25.0000 mg | ORAL_TABLET | Freq: Every day | ORAL | Status: DC
  Administered 2019-12-20 – 2019-12-24 (×4): 25 mg via ORAL
  Filled 2019-12-20 (×5): qty 1

## 2019-12-20 MED ORDER — LOSARTAN POTASSIUM 25 MG PO TABS
25.0000 mg | ORAL_TABLET | Freq: Every day | ORAL | Status: DC
  Administered 2019-12-20: 25 mg via ORAL
  Filled 2019-12-20 (×2): qty 1

## 2019-12-20 NOTE — Consult Note (Addendum)
Cardiology Consultation:   Patient ID: Ronald Young MRN: [id number]; DOB: [birth year]  Admit date: 12/20/2019 Date of Consult: 12/20/2019  Primary Care Provider: Patient, No Pcp Per [facility] Cardiologist: New-remotely seen by [facility] Electrophysiologist:  None    Patient Profile:   Ronald Young is a 49 y.o. male with a hx of negative nuc study in 2010 with EF 51% at that time, G2DD, nonischemic cardiomyopathy with EF of 25% per echo in 2016, carotid artery disease (1-39% stenosis B/L), LVH, HTN, non-compliance, prior cocaine use who is being seen today for the evaluation of CHF at the request of Dr. Alanda Slim.  History of Present Illness:   Ronald Young has remotely been seen by [facility]. Chart review reports negative nuclear study in 2010 with EF 51%. He was seen during an admission for syncope, suspected orthostatic 2/2 dehydration. EF at that time was found to be decreased to 25-30%. Echo also showed G2DD, diffuse hypokinesis, trivial MR, mildly dilated LA, mild TR. Bps were severely elevated as well. Troponin negative. EKG with SR. A Myoview was ordered which showed no ischemia. He was started on coreg, lisinopril, lasix, hydralazine, aspirin. The patient was seen back in follow-up and started on spironolactone. Patient was lost to follow-up.   The patient presented to the ED 12/20/19 for shortness of breath for the last 2 weeks. Patient says it has been intermittent. Not worsening and not necessarily worse on exertion. He describes orthopnea at night. Denies LLE. No chest pain, palpitations. No fever, chills, recent illness. No nausea, vomiting, abd pain/distention. He generally eats canned foods or fast food. Patient does not work and lives with his mother. Lifestyle is very sedentary. He denies recent drug use. He quit smoking tobacco and marijuana 2 years ago. He has not been taking any medications for the last 5-6 years. He does not regularly follow with a PCP.   In the ED BP  196/136, pulse 80, RR 18, Temp 64F, 96% O2. Labs showed potassium 3.6, glucose 120, creatinine 1.35, BUN 16, normal LFTs, WBC 8.8, Hgb 14. BNP 984, HS troponin 147>125. CXR showed cardiac enlargement and mild vascular congestion. EKG showed sins tachy, 104 bpm, LVH with repol abnormalities. COVID negative. The patient was given lasix 40 mg and labetolol 10 mg once and admitted for further work-up.   Past Medical History:  Diagnosis Date  . Arthritis   . Cardiomyopathy (HCC) 09/22/2014  . Hypertension     History reviewed. No pertinent surgical history.   Home Medications:  Prior to Admission medications   Medication Sig Start Date End Date Taking? Authorizing Provider  acetaminophen-codeine (TYLENOL #3) 300-30 MG tablet Take 1 tablet by mouth every 4 (four) hours as needed for moderate pain or severe pain.   Yes [provider]  acetaminophen (TYLENOL) 500 MG tablet Take 1 tablet (500 mg total) by mouth every 6 (six) hours as needed for mild pain or headache (pain). Patient not taking: Reported on 12/20/2019 09/23/14   Zannie Cove, MD  carvedilol (COREG) 6.25 MG tablet Take 1 tablet (6.25 mg total) by mouth 2 (two) times daily with a meal. Patient not taking: Reported on 12/20/2019 10/30/14   Hoy Register, MD  furosemide (LASIX) 20 MG tablet Take 1 tablet (20 mg total) by mouth daily. Patient not taking: Reported on 10/22/2014 09/23/14   Zannie Cove, MD  lisinopril (PRINIVIL,ZESTRIL) 40 MG tablet Take 1 tablet (40 mg total) by mouth daily. Patient not taking: Reported on 12/20/2019 10/30/14   Hoy Register,  MD  spironolactone (ALDACTONE) 25 MG tablet Take 1 tablet (25 mg total) by mouth daily. Patient not taking: Reported on 12/20/2019 10/22/14   Daleen Squibb, Jesse Sans, MD  amLODipine (NORVASC) 10 MG tablet Take 1 tablet (10 mg total) by mouth daily. Patient not taking: Reported on 08/29/2014 08/25/14 08/29/14  Felicie Morn, NP  benazepril (LOTENSIN) 10 MG tablet Take 1 tablet (10 mg  total) by mouth daily. Patient not taking: Reported on 08/29/2014 08/25/14 08/29/14  Felicie Morn, NP    Inpatient Medications: Scheduled Meds: . carvedilol  12.5 mg Oral BID WC  . enoxaparin (LOVENOX) injection  40 mg Subcutaneous Q24H  . [START ON 12/21/2019] furosemide  40 mg Intravenous Q12H  . losartan  25 mg Oral Daily  . sodium chloride flush  3 mL Intravenous Q12H   Continuous Infusions: . sodium chloride     PRN Meds: sodium chloride, acetaminophen, ondansetron (ZOFRAN) IV, sodium chloride flush  Allergies:    Allergies  Allergen Reactions  . Strawberry Extract Hives    Social History:   Social History   Socioeconomic History  . Marital status: Single    Spouse name: Not on file  . Number of children: Not on file  . Years of education: Not on file  . Highest education level: Not on file  Occupational History  . Not on file  Tobacco Use  . Smoking status: Former Smoker    Types: Cigarettes  Substance and Sexual Activity  . Alcohol use: Yes    Comment: "drank a couple beers last weekend"  . Drug use: Yes    Types: Marijuana    Comment: Last use 10/20/14.  Marland Kitchen Sexual activity: Not on file  Other Topics Concern  . Not on file  Social History Narrative  . Not on file   Social Determinants of Health   Financial Resource Strain:   . Difficulty of Paying Living Expenses: Not on file  Food Insecurity:   . Worried About Programme researcher, broadcasting/film/video in the Last Year: Not on file  . Ran Out of Food in the Last Year: Not on file  Transportation Needs:   . Lack of Transportation (Medical): Not on file  . Lack of Transportation (Non-Medical): Not on file  Physical Activity:   . Days of Exercise per Week: Not on file  . Minutes of Exercise per Session: Not on file  Stress:   . Feeling of Stress : Not on file  Social Connections:   . Frequency of Communication with Friends and Family: Not on file  . Frequency of Social Gatherings with Friends and Family: Not on file  .  Attends Religious Services: Not on file  . Active Member of Clubs or Organizations: Not on file  . Attends Banker Meetings: Not on file  . Marital Status: Not on file  Intimate Partner Violence:   . Fear of Current or Ex-Partner: Not on file  . Emotionally Abused: Not on file  . Physically Abused: Not on file  . Sexually Abused: Not on file    Family History:   Family History  Problem Relation Age of Onset  . Hypertension Mother   . Colon cancer Other      ROS:  Please see the history of present illness.  All other ROS reviewed and negative.     Physical Exam/Data:   Vitals:   12/20/19 1007 12/20/19 1412 12/20/19 1441 12/20/19 1500  BP: (!) 194/157 (!) 207/164 (!) 200/149 (!) 166/125  Pulse: Marland Kitchen)  109 (!) 104 99 87  Resp: 18 20 (!) 37 (!) 21  Temp:      TempSrc:      SpO2: 96% 99% 97% 98%   No intake or output data in the 24 hours ending 12/20/19 1527 Last 3 Weights 12/18/2019 10/22/2014 10/13/2014  Weight (lbs) 265 lb 268 lb 266 lb 6.4 oz  Weight (kg) 120.203 kg 121.564 kg 120.838 kg     There is no height or weight on file to calculate BMI.  General:  Well nourished, well developed, in no acute distress HEENT: normal Lymph: no adenopathy Neck: +JVD although difficult to tell Endocrine:  No thryomegaly Vascular: No carotid bruits; FA pulses 2+ bilaterally without bruits  Cardiac:  normal S1, S2; RRR; + murmur  Lungs: diffusely diminished with crackles at bases Abd: soft, nontender, no hepatomegaly  Ext: no edema Musculoskeletal:  No deformities, BUE and BLE strength normal and equal Skin: warm and dry  Neuro:  CNs 2-12 intact, no focal abnormalities noted Psych:  Normal affect   EKG:  The EKG was personally reviewed and demonstrates:  Sinus tachycardia, LVH with repol abnormalities Telemetry:  Telemetry was personally reviewed and demonstrates:  NSR HR 80s, PVCs  Relevant CV Studies:   Echo 2016  Study Conclusions   - Left ventricle: False  tendon in the LV apex. The cavity size was  mildly dilated. There was moderate concentric hypertrophy.  Systolic function was severely reduced. The estimated ejection  fraction was in the range of 25% to 30%. Diffuse hypokinesis.  Features are consistent with a pseudonormal left ventricular  filling pattern, with concomitant abnormal relaxation and  increased filling pressure (grade 2 diastolic dysfunction).  Doppler parameters are consistent with elevated ventricular  end-diastolic filling pressure.  - Aortic valve: Trileaflet; normal thickness leaflets.  Transvalvular velocity was within the normal range. There was no  stenosis. There was no regurgitation.  - Mitral valve: Structurally normal valve. There was trivial  regurgitation.  - Left atrium: The atrium was mildly dilated.  - Right ventricle: The cavity size was mildly decreased. Wall  thickness was normal.  - Right atrium: The atrium was normal in size.  - Tricuspid valve: There was mild regurgitation.  - Pulmonary arteries: Systolic pressure was within the normal  range.  - Inferior vena cava: The vessel was normal in size.  - Pericardium, extracardiac: There was no pericardial effusion.   Myoview stress test 2016  The left ventricular ejection fraction is moderately decreased (30-44%).  This is a high risk study.  No abnormal perfusion identified.  Laboratory Data:  High Sensitivity Troponin:   Recent Labs  Lab 12/20/19 1003 12/20/19 1421  TROPONINIHS 147* 125*     Chemistry Recent Labs  Lab 12/18/19 1722 12/20/19 1003  NA 140 139  K 4.2 3.6  CL 107 106  CO2 22 23  GLUCOSE 142* 120*  BUN 14 16  CREATININE 1.35* 1.35*  CALCIUM 9.3 9.1  GFRNONAA >60 >60  GFRAA >60 >60  ANIONGAP 11 10    Recent Labs  Lab 12/20/19 1003  PROT 7.5  ALBUMIN 3.7  AST 31  ALT 43  ALKPHOS 78  BILITOT 0.9   Hematology Recent Labs  Lab 12/18/19 1722 12/20/19 1003  WBC 6.6 8.7  RBC 5.01  5.03  HGB 13.7 14.0  HCT 42.8 43.5  MCV 85.4 86.5  MCH 27.3 27.8  MCHC 32.0 32.2  RDW 14.9 14.9  PLT 381 367   BNP Recent Labs  Lab  12/20/19 1003  BNP 984.2*    DDimer No results for input(s): DDIMER in the last 168 hours.   Radiology/Studies:  DG Chest 2 View  Result Date: 12/20/2019 CLINICAL DATA:  Shortness of breath after exertion. EXAM: CHEST - 2 VIEW COMPARISON:  12/18/2019 FINDINGS: Enlarged cardiopericardial silhouette, similar to prior. Mild vascular congestion. No pleural effusions. No discernible pneumothorax. No consolidation. No acute osseous abnormality. IMPRESSION: Similar cardiopericardial silhouette enlargement and mild vascular congestion. No focal consolidation. Electronically Signed   By: Feliberto Harts MD   On: 12/20/2019 08:17   DG Chest 2 View  Result Date: 12/18/2019 CLINICAL DATA:  49 year old male with shortness of breath. EXAM: CHEST - 2 VIEW COMPARISON:  Chest radiograph dated 09/26/2014. FINDINGS: There is cardiomegaly with mild vascular congestion, new since the prior radiograph. No focal consolidation, pleural effusion or pneumothorax. No acute osseous pathology. IMPRESSION: Cardiomegaly with mild vascular congestion. No focal consolidation. Electronically Signed   By: Elgie Collard M.D.   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 Bps significantly elevated on admission given Labetalol injection. BNP up to 900. CXR with mild vascular congestion. HS troponin 147>125, Given lasix 40 mg in the ED - Patient last seen in 2016 and Echo  showed low EF 25-30%. He has been off medication since that time with no PCP follow-up - repeat echo ordered - pt with mild crackles, minimal JVD although difficult to tell, no LLE - started on IV lasix  - started on Coreg 12.5mg  BID, losartan 25mg  daily - strict I/Os, monitor creatinine, daily weights -  MD to see  Elevated troponin - Hs troponin 147>125, trending down - In the setting of severe hypertension and CHF - patient had high risk Myoview stress test in 2016 with EF 30-45% and no abnormal perfusion identified - patient denies chest pain although lives a very sedentary lifestyle - will risk stratify with A1C and lipid panel - patient might require ischemic eval pending echo  Hypertensive urgency - history of uncontrolled HTN and not on medications for 5-6 years - up to 207/164 in the ED - started on coreg and losartan. Also IV lasix - pressures slowly improving. Will likely need further titration of meds  Noncompliance - difficulty affording meds in the past - TOC consulted  History of cocaine use - denies recent use - UDS negative  For questions or updates, please contact [facility] Please consult [email or web address] for contact info under    Signed, Cadence 2017, PA-C  12/20/2019 3:27 PM   Patient seen, examined. Available data reviewed. Agree with findings, assessment, and plan as outlined by Cadence 12/22/2019, PA-C.  The patient is independently interviewed and examined.  He is alert, oriented, in no distress.  JVP appears mildly elevated, carotid upstrokes are normal without bruits, HEENT is normal, lung fields demonstrate inspiratory crackles bilaterally, heart is regular rate and rhythm with no murmur, abdomen is soft and nontender, extremities have trace bilateral pretibial edema.  EKG shows normal sinus rhythm with LVH and repolarization abnormality, chest x-ray shows cardiomegaly and pulmonary venous congestion.  The patient presents with typical symptoms of acute on chronic combined systolic and diastolic heart failure with known severely reduced LV systolic function (LVEF less than 35%).  He has not had any recent cardiology follow-up.  He has been off of all of his medications.  The patient is severely hypertensive  on arrival with blood pressures in the range of  190/125-157.  Agree with plans as outlined above.  Care management should be very helpful in helping him obtain affordable medications.  It seems like cost has been the biggest barrier to medication compliance and follow-up care since he is uninsured. We will try to put him on as simple of a program as possible using generic medications.  He will clearly need multi drug therapy.  We will start with carvedilol, losartan, and spironolactone.  Updated 2D echo ordered. Continue IV diuresis to treat congestion/pulmonary edema. Lengthy discussion about need for a low sodium diet, medication compliance, BP control, and outpatient follow-up. His mother is at the bedside and all of her questions are answered.   Ronald Young, M.D. 12/20/2019 5:27 PM

## 2019-12-20 NOTE — ED Notes (Signed)
Patient given sandwich and water

## 2019-12-20 NOTE — ED Notes (Signed)
Hospitalist at bedside 

## 2019-12-20 NOTE — ED Notes (Signed)
Save blue in main lab 

## 2019-12-20 NOTE — ED Triage Notes (Signed)
Per pt, states he has been having trouble breathing after exertion on and off for 2 weeks since receiving Covid vaccine-no other symptoms

## 2019-12-20 NOTE — ED Provider Notes (Signed)
[facility]-EMERGENCY DEPT Provider Note   CSN: [id number] Arrival date & time: 12/20/19  0708     History Chief Complaint  Patient presents with  . Shortness of Breath    Ronald Young is a 49 y.o. male.  The history is provided by the patient.  Shortness of Breath Severity:  Moderate Onset quality:  Gradual Duration:  2 weeks Timing:  Intermittent Progression:  Waxing and waning Chronicity:  New Context: activity   Relieved by:  Rest Worsened by:  Exertion Associated symptoms: no abdominal pain, no chest pain, no claudication, no cough, no diaphoresis, no ear pain, no fever, no neck pain, no sore throat and no sputum production   Risk factors comment:  HTN/HF      Past Medical History:  Diagnosis Date  . Arthritis   . Cardiomyopathy (HCC) 09/22/2014  . Hypertension     Patient Active Problem List   Diagnosis Date Noted  . Acute on chronic combined systolic and diastolic CHF (congestive heart failure) (HCC) 12/20/2019  . Essential hypertension 09/26/2014  . Bradycardia 09/26/2014  . Cocaine use 09/23/2014  . 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 Cardiomyopathy (HCC) 09/22/2014  . Hypertensive urgency 09/21/2014  . Syncope 09/21/2014  . 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 ARF (acute renal failure) (HCC) 09/21/2014  . Elevated troponin     History reviewed. No pertinent surgical history.     Family History  Problem Relation Age of Onset  . Hypertension Mother   . Colon cancer Other     Social History   Tobacco Use  . Smoking status: Former Smoker    Types: Cigarettes  Substance Use Topics  . Alcohol use: Yes    Comment: "drank a couple beers last weekend"  . Drug use: Yes    Types: Marijuana    Comment: Last use 10/20/14.    Home Medications Prior to Admission medications   Medication Sig Start Date End Date Taking? Authorizing Provider  acetaminophen-codeine (TYLENOL #3) 300-30 MG tablet  Take 1 tablet by mouth every 4 (four) hours as needed for moderate pain or severe pain.   Yes [provider]  acetaminophen (TYLENOL) 500 MG tablet Take 1 tablet (500 mg total) by mouth every 6 (six) hours as needed for mild pain or headache (pain). Patient not taking: Reported on 12/20/2019 09/23/14   Zannie Cove, MD  carvedilol (COREG) 6.25 MG tablet Take 1 tablet (6.25 mg total) by mouth 2 (two) times daily with a meal. Patient not taking: Reported on 12/20/2019 10/30/14   Hoy Register, MD  furosemide (LASIX) 20 MG tablet Take 1 tablet (20 mg total) by mouth daily. Patient not taking: Reported on 10/22/2014 09/23/14   Zannie Cove, MD  lisinopril (PRINIVIL,ZESTRIL) 40 MG tablet Take 1 tablet (40 mg total) by mouth daily. Patient not taking: Reported on 12/20/2019 10/30/14   Hoy Register, MD  spironolactone (ALDACTONE) 25 MG tablet Take 1 tablet (25 mg total) by mouth daily. Patient not taking: Reported on 12/20/2019 10/22/14   Daleen Squibb, Jesse Sans, MD  amLODipine (NORVASC) 10 MG tablet Take 1 tablet (10 mg total) by mouth daily. Patient not taking: Reported on 08/29/2014 08/25/14 08/29/14  Felicie Morn, NP  benazepril (LOTENSIN) 10 MG tablet Take 1 tablet (10 mg total) by mouth daily. Patient not taking: Reported on 08/29/2014 08/25/14 08/29/14  Felicie Morn, NP    Allergies    Strawberry extract  Review of Systems   Review of Systems  Constitutional:  Negative for diaphoresis and fever.  HENT: Negative for ear pain and sore throat.   Respiratory: Positive for shortness of breath. Negative for cough and sputum production.   Cardiovascular: Positive for leg swelling. Negative for chest pain and claudication.  Gastrointestinal: Negative for abdominal pain.  Musculoskeletal: Negative for neck pain.    Physical Exam Updated Vital Signs  ED Triage Vitals  Enc Vitals Group     BP 12/20/19 0731 (!) 196/136     Pulse Rate 12/20/19 0731 80     Resp 12/20/19 0731 18     Temp 12/20/19  0731 99 F (37.2 C)     Temp Source 12/20/19 0731 Oral     SpO2 12/20/19 0731 96 %     Weight --      Height --      Head Circumference --      Peak Flow --      Pain Score 12/20/19 0734 0     Pain Loc --      Pain Edu? --      Excl. in GC? --     Physical Exam Vitals and nursing note reviewed.  Constitutional:      General: He is not in acute distress.    Appearance: He is well-developed. He is not ill-appearing.  HENT:     Head: Normocephalic and atraumatic.  Eyes:     Conjunctiva/sclera: Conjunctivae normal.     Pupils: Pupils are equal, round, and reactive to light.  Cardiovascular:     Rate and Rhythm: Normal rate and regular rhythm.     Pulses: Normal pulses.     Heart sounds: Normal heart sounds. No murmur heard.   Pulmonary:     Effort: Pulmonary effort is normal. No respiratory distress.     Breath sounds: Normal breath sounds. No decreased breath sounds.  Abdominal:     Palpations: Abdomen is soft.     Tenderness: There is no abdominal tenderness.  Musculoskeletal:     Cervical back: Normal range of motion and neck supple.     Right lower leg: Edema (1+) present.     Left lower leg: Edema (1+) present.  Skin:    General: Skin is warm and dry.     Capillary Refill: Capillary refill takes less than 2 seconds.  Neurological:     General: No focal deficit present.     Mental Status: He is alert.     ED Results / Procedures / Treatments   Labs (all labs ordered are listed, but only abnormal results are displayed) Labs Reviewed  BASIC METABOLIC PANEL - Abnormal; Notable for the following components:      Result Value   Glucose, Bld 120 (*)    Creatinine, Ser 1.35 (*)    All other components within normal limits  BRAIN NATRIURETIC PEPTIDE - Abnormal; Notable for the following components:   B Natriuretic Peptide 984.2 (*)    All other components within normal limits  TROPONIN I (HIGH SENSITIVITY) - Abnormal; Notable for the following components:    Troponin I (High Sensitivity) 147 (*)    All other components within normal limits  SARS CORONAVIRUS 2 BY RT PCR (HOSPITAL ORDER, PERFORMED IN [facility])  CBC WITH DIFFERENTIAL/PLATELET  HEPATIC FUNCTION PANEL  RAPID URINE DRUG SCREEN, HOSP PERFORMED  TROPONIN I (HIGH SENSITIVITY)    EKG EKG Interpretation  Date/Time:  Friday December 20 2019 08:03:32 EDT Ventricular Rate:  104 PR Interval:  190 QRS Duration: 108  QT Interval:  358 QTC Calculation: 470 R Axis:   26 Text Interpretation: Sinus tachycardia Possible Left atrial enlargement Left ventricular hypertrophy with repolarization abnormality ( Cornell product ) Abnormal ECG Confirmed by Virgina Norfolk [phone]) on 12/20/2019 8:11:31 AM   Radiology DG Chest 2 View  Result Date: 12/20/2019 CLINICAL DATA:  Shortness of breath after exertion. EXAM: CHEST - 2 VIEW COMPARISON:  12/18/2019 FINDINGS: Enlarged cardiopericardial silhouette, similar to prior. Mild vascular congestion. No pleural effusions. No discernible pneumothorax. No consolidation. No acute osseous abnormality. IMPRESSION: Similar cardiopericardial silhouette enlargement and mild vascular congestion. No focal consolidation. Electronically Signed   By: Feliberto Harts MD   On: 12/20/2019 08:17   DG Chest 2 View  Result Date: 12/18/2019 CLINICAL DATA:  49 year old male with shortness of breath. EXAM: CHEST - 2 VIEW COMPARISON:  Chest radiograph dated 09/26/2014. FINDINGS: There is cardiomegaly with mild vascular congestion, new since the prior radiograph. No focal consolidation, pleural effusion or pneumothorax. No acute osseous pathology. IMPRESSION: Cardiomegaly with mild vascular congestion. No focal consolidation. Electronically Signed   By: Elgie Collard M.D.   On: 12/18/2019 18:25    Procedures .Critical Care Performed by: Virgina Norfolk, DO Authorized by: Virgina Norfolk, DO   Critical care provider statement:    Critical care time (minutes):   45   Critical care was necessary to treat or prevent imminent or life-threatening deterioration of the following conditions:  Cardiac failure   Critical care was time spent personally by me on the following activities:  Blood draw for specimens, development of treatment plan with patient or surrogate, discussions with primary provider, evaluation of patient's response to treatment, examination of patient, obtaining history from patient or surrogate, ordering and performing treatments and interventions, ordering and review of laboratory studies, ordering and review of radiographic studies, pulse oximetry, re-evaluation of patient's condition and review of old charts   I assumed direction of critical care for this patient from another provider in my specialty: no     (including critical care time)  Medications Ordered in ED Medications  labetalol (NORMODYNE) injection 10 mg (has no administration in time range)  furosemide (LASIX) injection 40 mg (40 mg Intravenous Given 12/20/19 1423)    ED Course  I have reviewed the triage vital signs and the nursing notes.  Pertinent labs & imaging results that were available during my care of the patient were reviewed by me and considered in my medical decision making (see chart for details).    MDM Rules/Calculators/A&P                          Hamsa Laurich is a 49 year old male with history of hypertension, cardiomyopathy who presents to the ED with shortness of breath.  Patient with hypertension of 207/164 but otherwise unremarkable vitals.  Has had increasing shortness of breath with exertion over the last 2 weeks.  Does not take any blood pressure medications or heart failure medications anymore due to noncompliance and not having a primary doctor.  He has some trace edema to 1+ pitting edema in his legs.  Patient overall has clear breath sounds.  Suspect heart failure versus hypertensive emergency versus both.  Denies any infectious symptoms.  Lab work  is already done prior to my evaluation that is significant for BNP of about 1000 and troponin of 150.  Patient not having any chest pain.  EKG shows sinus rhythm with no ischemic changes.  Overall suspect hypertensive emergency.  Creatinine  is at baseline.  Otherwise no significant anemia, electrolyte abnormality.  Patient given IV Lasix and IV labetalol and will admit for further hypertensive emergency care given evidence of end organ damage with heart failure symptoms.  This chart was dictated using voice recognition software.  Despite best efforts to proofread,  errors can occur which can change the documentation meaning.    Final Clinical Impression(s) / ED Diagnoses Final diagnoses:  Acute on chronic heart failure, unspecified heart failure type [facility])  Hypertensive emergency  Elevated troponin    Rx / DC Orders ED Discharge Orders    None       Virgina Norfolk, DO 12/20/19 1432

## 2019-12-20 NOTE — ED Notes (Signed)
ED Provider at bedside. 

## 2019-12-20 NOTE — ED Notes (Signed)
Patient given meal tray.

## 2019-12-20 NOTE — TOC Initial Note (Signed)
Transition of Care [facility]) - Initial/Assessment Note    Patient Details  Name: Ronald Young MRN: [id number] Date of Birth: [birth year]  [facility]) CM/SW Contact:    Elliot Cousin, RN Phone Number: [phone] 12/20/2019, 4:04 PM  Clinical Narrative:                  TOC CM spoke to pt and states he wants assistance with applying for disability. Will send referral to [facility]. Pt states he lives with mother. Gave permission to speak to mother in case of emergency. Pt states he can afford his meds at [facility]. Received note from attending his meds may change will need to check price at [facility]. Pt states he has been to [facility]. Appt arranged for 01/27/2020 at 230 pm to reestablish with PCP. Explained to pt the importance of taking meds and following up with PCP appt to show compliance with disability process. Attending updated.    Expected Discharge Plan: Home/Self Care Barriers to Discharge: Continued Medical Work up   Patient Goals and CMS Choice Patient states their goals for this hospitalization and ongoing recovery are:: prefers to go home CMS Medicare.gov Compare Post Acute Care list provided to:: Patient Choice offered to / list presented to : Patient  Expected Discharge Plan and Services Expected Discharge Plan: Home/Self Care In-house Referral: Clinical Social Work Discharge Planning Services: CM Consult, MATCH Program, Follow-up appt scheduled, Medication Assistance   Living arrangements for the past 2 months: Single Family Home                                      Prior Living Arrangements/Services Living arrangements for the past 2 months: Single Family Home Lives with:: Parents Patient language and need for interpreter reviewed:: Yes Do you feel safe going back to the place where you live?: Yes      Need for Family Participation in Patient Care: No (Comment) Care giver support system in place?: No (comment)   Criminal Activity/Legal  Involvement Pertinent to Current Situation/Hospitalization: No - Comment as needed  Activities of Daily Living Home Assistive Devices/Equipment: Eyeglasses (reading glasses) ADL Screening (condition at time of admission) Patient's cognitive ability adequate to safely complete daily activities?: Yes Is the patient deaf or have difficulty hearing?: No Does the patient have difficulty seeing, even when wearing glasses/contacts?: No Does the patient have difficulty concentrating, remembering, or making decisions?: No Patient able to express need for assistance with ADLs?: Yes Does the patient have difficulty dressing or bathing?: No Independently performs ADLs?: Yes (appropriate for developmental age) Does the patient have difficulty walking or climbing stairs?: No Weakness of Legs: None Weakness of Arms/Hands: None  Permission Sought/Granted Permission sought to share information with : Case Manager, PCP, Family Supports Permission granted to share information with : Yes, Verbal Permission Granted  Share Information with NAME: Fields Oros  Permission granted to share info w AGENCY: [facility]  Permission granted to share info w Relationship: mother  Permission granted to share info w Contact Information: [phone]  Emotional Assessment Appearance:: Appears stated age Attitude/Demeanor/Rapport: Engaged Affect (typically observed): Accepting Orientation: : Oriented to Self, Oriented to Place, Oriented to  Time, Oriented to Situation   Psych Involvement: No (comment)  Admission diagnosis:  Acute on chronic combined systolic and diastolic CHF (congestive heart failure) (HCC) [I50.43] Patient Active Problem List   Diagnosis Date Noted  . Acute on chronic  combined systolic and diastolic CHF (congestive heart failure) (HCC) 12/20/2019  . Essential hypertension 09/26/2014  . Bradycardia 09/26/2014  . Cocaine use 09/23/2014  . 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 Cardiomyopathy (HCC) 09/22/2014  . Hypertensive urgency 09/21/2014  . Syncope 09/21/2014  . 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 ARF (acute renal failure) (HCC) 09/21/2014  . Elevated troponin    PCP:  Patient, No Pcp Per Pharmacy:   [id number] - [place] ([place] - [address] [place] [address] Phone: [phone] Fax: [phone]  [facility] - [place], [place] - [address] [place] [address] Phone: [phone] Fax: [phone]     Social Determinants of Health (SDOH) Interventions    Readmission Risk Interventions No flowsheet data found.

## 2019-12-20 NOTE — ED Provider Notes (Signed)
11:07 am was made aware of labs and told that patient needs to be bedded.   Virgina Norfolk, DO 12/20/19 1337

## 2019-12-20 NOTE — H&P (Signed)
History and Physical    Ronald Young TDS:[id number] DOB: 1970/09/04 DOA: 12/20/2019  PCP: Ronald Young, No Pcp Per-confirmed Ronald Young coming from: Home  Chief Complaint: Shortness of breath  HPI: Ronald Young is a 49 y.o. male with history of combined CHF, HTN not on medication and cocaine use presenting with progressive shortness of breath, orthopnea and dry cough for about 2 weeks.  Ronald Young was diagnosed with combined CHF with EF of 25 to 30% and G2-DD in 2016.  At that time, he was discharged on diuretics and GDMT but never followed up.  He now presents with progressive shortness of breath, orthopnea and DOE over the last 2 weeks after his second dose of COVID-19 vaccination.  He denies PND, edema, chest pain or palpitation.  He denies URI symptoms, sore throat, fever, chills, nausea, vomiting, abdominal pain, diarrhea or UTI symptoms.  He denies headache, vision change, focal weakness, numbness or tingling.  He does not eat salty food.  Does not watch his fluid intake.  Reports NSAID almost daily for arthritis.  He denies family history of heart disease.  Of note, Ronald Young presented to ED yesterday but seems to have eloped.   Ronald Young lives with his mother.  Denies smoking cigarettes, drinking alcohol recreational drug use.  In ED, DBP as high as 164 but improved to 125.  SBP as high as 207 but improved to 166.  HR ranged from 80s to 109.  Saturation mid 90s to upper 90s on room air. Cr 1.35 (1.35 yesterday).  BUN 16.  Glucose 120.  CBC without significant finding.  BNP 984. HS trop 147>> 125.  EKG with sinus tachycardia, LVH but no acute ischemic finding.  CXR concerning for CHF.  COVID-19 PCR ordered.  Started on IV Lasix and as needed labetalol.  Hospitalist service called for admission.  ROS All review of system negative except for pertinent positives and negatives as history of present illness above. PMH Past Medical History:  Diagnosis Date  . Arthritis   . Cardiomyopathy (HCC) 09/22/2014   . Hypertension    PSH No surgical history  Fam HX Family History  Problem Relation Age of Onset  . Hypertension Mother   . Colon cancer Other     Social Hx  reports that he has quit smoking. His smoking use included cigarettes. He does not have any smokeless tobacco history on file. He reports current alcohol use. He reports current drug use. Drug: Marijuana.  Allergy Allergies  Allergen Reactions  . Strawberry Extract Hives   Home Meds Prior to Admission medications   Medication Sig Start Date End Date Taking? Authorizing Provider  acetaminophen-codeine (TYLENOL #3) 300-30 MG tablet Take 1 tablet by mouth every 4 (four) hours as needed for moderate pain or severe pain.   Yes [provider]  acetaminophen (TYLENOL) 500 MG tablet Take 1 tablet (500 mg total) by mouth every 6 (six) hours as needed for mild pain or headache (pain). Ronald Young not taking: Reported on 12/20/2019 09/23/14   Zannie Cove, MD  carvedilol (COREG) 6.25 MG tablet Take 1 tablet (6.25 mg total) by mouth 2 (two) times daily with a meal. Ronald Young not taking: Reported on 12/20/2019 10/30/14   Hoy Register, MD  furosemide (LASIX) 20 MG tablet Take 1 tablet (20 mg total) by mouth daily. Ronald Young not taking: Reported on 10/22/2014 09/23/14   Zannie Cove, MD  lisinopril (PRINIVIL,ZESTRIL) 40 MG tablet Take 1 tablet (40 mg total) by mouth daily. Ronald Young not taking: Reported on 12/20/2019 10/30/14  Hoy Register, MD  spironolactone (ALDACTONE) 25 MG tablet Take 1 tablet (25 mg total) by mouth daily. Ronald Young not taking: Reported on 12/20/2019 10/22/14   Daleen Squibb, Jesse Sans, MD  amLODipine (NORVASC) 10 MG tablet Take 1 tablet (10 mg total) by mouth daily. Ronald Young not taking: Reported on 08/29/2014 08/25/14 08/29/14  Felicie Morn, NP  benazepril (LOTENSIN) 10 MG tablet Take 1 tablet (10 mg total) by mouth daily. Ronald Young not taking: Reported on 08/29/2014 08/25/14 08/29/14  Felicie Morn, NP    Physical Exam: Vitals:    12/20/19 1007 12/20/19 1412 12/20/19 1441 12/20/19 1500  BP: (!) 194/157 (!) 207/164 (!) 200/149 (!) 166/125  Pulse: (!) 109 (!) 104 99 87  Resp: 18 20 (!) 37 (!) 21  Temp:      TempSrc:      SpO2: 96% 99% 97% 98%    GENERAL: No acute distress.  Appears well.  HEENT: MMM.  Vision and hearing grossly intact.  NECK: Supple.  No apparent JVD but difficult exam.  RESP:  No IWOB. Good air movement bilaterally. CVS:  RRR. Heart sounds normal.  ABD/GI/GU: Bowel sounds present. Soft. Non tender.  MSK/EXT:  Moves extremities. No apparent deformity or edema.  SKIN: Acanthosis nigricans NEURO: Awake, alert and oriented appropriately.  No gross deficit.  PSYCH: Calm. Normal affect.   Personally Reviewed Radiological Exams DG Chest 2 View  Result Date: 12/20/2019 CLINICAL DATA:  Shortness of breath after exertion. EXAM: CHEST - 2 VIEW COMPARISON:  12/18/2019 FINDINGS: Enlarged cardiopericardial silhouette, similar to prior. Mild vascular congestion. No pleural effusions. No discernible pneumothorax. No consolidation. No acute osseous abnormality. IMPRESSION: Similar cardiopericardial silhouette enlargement and mild vascular congestion. No focal consolidation. Electronically Signed   By: Feliberto Harts MD   On: 12/20/2019 08:17   DG Chest 2 View  Result Date: 12/18/2019 CLINICAL DATA:  50 year old male with shortness of breath. EXAM: CHEST - 2 VIEW COMPARISON:  Chest radiograph dated 09/26/2014. FINDINGS: There is cardiomegaly with mild vascular congestion, new since the prior radiograph. No focal consolidation, pleural effusion or pneumothorax. No acute osseous pathology. IMPRESSION: Cardiomegaly with mild vascular congestion. No focal consolidation. Electronically Signed   By: Elgie Collard M.D.   On: 12/18/2019 18:25     Personally Reviewed Labs: CBC: Recent Labs  Lab 12/18/19 1722 12/20/19 1003  WBC 6.6 8.7  NEUTROABS  --  5.7  HGB 13.7 14.0  HCT 42.8 43.5  MCV 85.4 86.5  PLT  381 367   Basic Metabolic Panel: Recent Labs  Lab 12/18/19 1722 12/20/19 1003  NA 140 139  K 4.2 3.6  CL 107 106  CO2 22 23  GLUCOSE 142* 120*  BUN 14 16  CREATININE 1.35* 1.35*  CALCIUM 9.3 9.1   GFR: Estimated Creatinine Clearance: 89.5 mL/min (A) (by C-G formula based on SCr of 1.35 mg/dL (H)). Liver Function Tests: Recent Labs  Lab 12/20/19 1003  AST 31  ALT 43  ALKPHOS 78  BILITOT 0.9  PROT 7.5  ALBUMIN 3.7   No results for input(s): LIPASE, AMYLASE in the last 168 hours. No results for input(s): AMMONIA in the last 168 hours. Coagulation Profile: No results for input(s): INR, PROTIME in the last 168 hours. Cardiac Enzymes: No results for input(s): CKTOTAL, CKMB, CKMBINDEX, TROPONINI in the last 168 hours. BNP (last 3 results) No results for input(s): PROBNP in the last 8760 hours. HbA1C: No results for input(s): HGBA1C in the last 72 hours. CBG: No results for input(s): GLUCAP in the  last 168 hours. Lipid Profile: No results for input(s): CHOL, HDL, LDLCALC, TRIG, CHOLHDL, LDLDIRECT in the last 72 hours. Thyroid Function Tests: No results for input(s): TSH, T4TOTAL, FREET4, T3FREE, THYROIDAB in the last 72 hours. Anemia Panel: No results for input(s): VITAMINB12, FOLATE, FERRITIN, TIBC, IRON, RETICCTPCT in the last 72 hours. Urine analysis:    Component Value Date/Time   COLORURINE YELLOW 09/21/2014 1637   APPEARANCEUR CLEAR 09/21/2014 1637   LABSPEC 1.029 09/21/2014 1637   PHURINE 5.0 09/21/2014 1637   GLUCOSEU NEGATIVE 09/21/2014 1637   HGBUR NEGATIVE 09/21/2014 1637   BILIRUBINUR NEGATIVE 09/21/2014 1637   KETONESUR NEGATIVE 09/21/2014 1637   PROTEINUR NEGATIVE 09/21/2014 1637   UROBILINOGEN 0.2 09/21/2014 1637   NITRITE NEGATIVE 09/21/2014 1637   LEUKOCYTESUR NEGATIVE 09/21/2014 1637    Sepsis Labs:  None  Personally Reviewed EKG:  12-lead EKG with sinus tachycardia and LVH but no acute ischemic finding.  Assessment/Plan Active  Problems:   Acute on chronic combined systolic and diastolic CHF (congestive heart failure) (HCC) Echo in 2016 with EF of 25 to 30% and G2-DD.  Ronald Young was lost to follow-up since 2016.  Has not been on medication.  BNP elevated to 1000.  Mildly elevated troponin likely due to demand ischemia.  Chest x-ray consistent.  -CHF pathway-IV Lasix 40 mg twice daily -Carvedilol 12.5 mg twice daily -Started losartan 25 mg daily -Cardiology consulted -Update echocardiogram -Monitor fluid status and renal functions -Counseled against NSAID use. -TOC consult for PCP and medication assistance  Uncontrolled hypertension/hypertensive urgency -cardiac meds as above  Elevated troponin: Denies chest pain.  Pattern consistent with demand ischemia.  No evidence of acute ischemia on EKG. -Manage CHF and hypertension as above  Hyperglycemia without diagnosis of diabetes -Check hemoglobin A1c  Class II obesity -Encourage lifestyle change to lose weight.  History of cocaine use: Denies any recreational drug use recently. -Follow UDS  Care coordination-Ronald Young has no PCP or insurance.  Lost to follow-up when he was diagnosed with CHF in 2016. -TOC consulted  DVT prophylaxis: Subcu Lovenox  Code Status: Full code Family Communication: Updated Ronald Young's mother over the phone.  Disposition Plan: Admit to telemetry Consults called: Cardiology Admission status: Inpatient  Severity of Illness: The appropriate Ronald Young status for this Ronald Young is INPATIENT. Inpatient status is judged to be reasonable and necessary in order to provide the required intensity of service to ensure the Ronald Young's safety. The Ronald Young's presenting symptoms, physical exam findings, and initial radiographic and laboratory data in the context of their chronic comorbidities is felt to place them at high risk for further clinical deterioration. Furthermore, it is not anticipated that the Ronald Young will be medically stable for discharge from  the hospital within 2 midnights of admission. The following factors support the Ronald Young status of inpatient.    "           The Ronald Young's presenting symptoms include dyspnea, orthopnea and dry cough "           The worrisome physical exam findings include markedly elevated BP "           The initial radiographic and laboratory data are worrisome because elevated troponin and BNP "           The chronic co-morbidities include combined CHF, hypertension, no PCP     I certify that at the point of admission it is my clinical judgment that the Ronald Young will require inpatient hospital care spanning beyond 2 midnights from the point of admission due  to high intensity of service, high risk for further deterioration and high frequency of surveillance required.   Almon Hercules MD [facility]  If 7PM-7AM, please contact night-coverage [email or web address]  12/20/2019, 3:22 PM

## 2019-12-20 NOTE — ED Triage Notes (Signed)
EDP notified of abnormal labs

## 2019-12-21 ENCOUNTER — Encounter (HOSPITAL_COMMUNITY): Payer: Self-pay | Admitting: Student

## 2019-12-21 ENCOUNTER — Inpatient Hospital Stay (HOSPITAL_COMMUNITY): Payer: Self-pay

## 2019-12-21 DIAGNOSIS — N179 Acute kidney failure, unspecified: Secondary | ICD-10-CM

## 2019-12-21 DIAGNOSIS — I248 Other forms of acute ischemic heart disease: Secondary | ICD-10-CM

## 2019-12-21 DIAGNOSIS — I5023 Acute on chronic systolic (congestive) heart failure: Secondary | ICD-10-CM

## 2019-12-21 LAB — LIPID PANEL
Cholesterol: 183 mg/dL (ref 0–200)
HDL: 28 mg/dL — ABNORMAL LOW (ref >40)
LDL Cholesterol: 142 mg/dL — ABNORMAL HIGH (ref 0–99)
Total CHOL/HDL Ratio: 6.5 RATIO
Triglycerides: 67 mg/dL (ref <150)
VLDL: 13 mg/dL (ref 0–40)

## 2019-12-21 LAB — BASIC METABOLIC PANEL
Anion gap: 9 (ref 5–15)
BUN: 21 mg/dL — ABNORMAL HIGH (ref 6–20)
CO2: 26 mmol/L (ref 22–32)
Calcium: 9 mg/dL (ref 8.9–10.3)
Chloride: 105 mmol/L (ref 98–111)
Creatinine, Ser: 1.51 mg/dL — ABNORMAL HIGH (ref 0.61–1.24)
GFR calc Af Amer: >60 mL/min (ref >60)
GFR calc non Af Amer: 54 mL/min — ABNORMAL LOW (ref >60)
Glucose, Bld: 114 mg/dL — ABNORMAL HIGH (ref 70–99)
Potassium: 3.7 mmol/L (ref 3.5–5.1)
Sodium: 140 mmol/L (ref 135–145)

## 2019-12-21 LAB — ECHOCARDIOGRAM COMPLETE
Area-P 1/2: 6.74 cm2
S' Lateral: 5.3 cm

## 2019-12-21 LAB — MAGNESIUM: Magnesium: 2.3 mg/dL (ref 1.7–2.4)

## 2019-12-21 MED ORDER — MELATONIN 3 MG PO TABS
3.0000 mg | ORAL_TABLET | Freq: Every evening | ORAL | Status: DC | PRN
  Administered 2019-12-21 – 2019-12-23 (×3): 3 mg via ORAL
  Filled 2019-12-21 (×3): qty 1

## 2019-12-21 MED ORDER — ISOSORB DINITRATE-HYDRALAZINE 20-37.5 MG PO TABS
1.0000 | ORAL_TABLET | Freq: Three times a day (TID) | ORAL | Status: DC
  Administered 2019-12-21 – 2019-12-22 (×6): 1 via ORAL
  Filled 2019-12-21 (×7): qty 1

## 2019-12-21 MED ORDER — CARVEDILOL 25 MG PO TABS
25.0000 mg | ORAL_TABLET | Freq: Two times a day (BID) | ORAL | Status: DC
  Administered 2019-12-21 – 2019-12-24 (×6): 25 mg via ORAL
  Filled 2019-12-21 (×6): qty 1

## 2019-12-21 MED ORDER — PERFLUTREN LIPID MICROSPHERE
1.0000 mL | INTRAVENOUS | Status: AC | PRN
  Administered 2019-12-21: 2 mL via INTRAVENOUS
  Filled 2019-12-21: qty 10

## 2019-12-21 NOTE — Progress Notes (Addendum)
PROGRESS NOTE    Ronald Young  TSV:[id number] DOB: 1971-03-01 DOA: 12/20/2019 PCP: Patient, No Pcp Per   Brief Narrative:  Ronald Young is a 49 y.o. male with history of combined CHF, HTN not on medications and cocaine use presenting with progressive shortness of breath, orthopnea and dry cough for about 2 weeks. Patient was diagnosed with combined CHF with EF of 25 to 30% and G2-DD in 2016.  At that time, he was discharged on diuretics and GDMT but never followed up.  He now presents with progressive shortness of breath, orthopnea and DOE over the last 2 weeks after his second dose of COVID-19 vaccination.  He denies PND, edema, chest pain or palpitation.  He denies family history of heart disease. Of note, patient presented to ED yesterday but seems to have eloped.   He is admitted for acute on chronic combined systolic and diastolic CHF.  Cardiology consulted recommended repeat echocardiogram which shows EF less than 20%.  Patient might need ischemic work-up.  Left and right heart catheterization.   Assessment & Plan:   Active Problems:   Acute on chronic combined systolic and diastolic CHF (congestive heart failure) (HCC)   Acute on chronic combined systolic and diastolic CHF (congestive heart failure) (HCC) Echo in 2016 with EF of 25 to 30% and G2-DD.  Patient was lost to follow-up since 2016.  Has not been on any medications.  BNP elevated to 1000.  Mildly elevated troponin likely due to demand ischemia.  Chest x-ray consistent with CHF. -CHF pathway-IV Lasix 40 mg twice daily -Carvedilol 12.5 mg twice daily -Started losartan 25 mg daily -Cardiology consulted -Repeat ECHO, LVEF less than 20% -Monitor fluid status and renal functions -Counseled against NSAID use. -TOC consult for PCP and medication assistance  Uncontrolled hypertension/hypertensive urgency -cardiac meds as above  Elevated troponin: Denies chest pain.  Pattern consistent with demand ischemia.  No evidence of  acute ischemia on EKG. -Manage CHF and hypertension as above  Hyperglycemia without diagnosis of diabetes -Check hemoglobin A1c  Class II obesity -Encourage lifestyle change to lose weight.  History of cocaine use: Denies any recreational drug use recently. -Follow UDS  Care coordination-patient has no PCP or insurance.  Lost to follow-up when he was diagnosed with CHF in 2016. -TOC consulted   DVT prophylaxis: Lovenox Code Status: Full code Family Communication: d/w family at bedside. Disposition Plan:  Anticipated DC home in 1-2 days Consultants:   Cardiology   Procedures: Echocardiogram.  Antimicrobials: Anti-infectives (From admission, onward)   None      Subjective: Patient was seen and examined at bedside.  No overnight events.  Patient reports feeling much better.  He had echocardiogram , awaiting results, reports leg swelling has improved.  Objective: Vitals:   12/21/19 0645 12/21/19 0930 12/21/19 1019 12/21/19 1341  BP: (!) 136/109 (!) 144/101 (!) 148/104 109/75  Pulse: 66 72 75 64  Resp: 13 16  18   Temp:  98.2 F (36.8 C) (!) 97.5 F (36.4 C) 98.3 F (36.8 C)  TempSrc:  Oral Oral Oral  SpO2: 95% 96% 100% 94%  Weight:   118.8 kg   Height:   6' (1.829 m)     Intake/Output Summary (Last 24 hours) at 12/21/2019 1415 Last data filed at 12/21/2019 1300 Gross per 24 hour  Intake 240 ml  Output --  Net 240 ml   Filed Weights   12/21/19 1019  Weight: 118.8 kg    Examination:  General exam: Appears calm and comfortable  Respiratory system: Clear to auscultation. Respiratory effort normal. Cardiovascular system: S1 & S2 heard, RRR. No JVD, murmurs, rubs, gallops or clicks. No pedal edema. Gastrointestinal system: Abdomen is nondistended, soft and nontender. No organomegaly or masses felt. Normal bowel sounds heard. Central nervous system: Alert and oriented. No focal neurological deficits. Extremities: No leg swellings, no cyanosis,. Skin: No  rashes, lesions or ulcers Psychiatry: Judgement and insight appear normal. Mood & affect appropriate.     Data Reviewed: I have personally reviewed following labs and imaging studies  CBC: Recent Labs  Lab 12/18/19 1722 12/20/19 1003  WBC 6.6 8.7  NEUTROABS  --  5.7  HGB 13.7 14.0  HCT 42.8 43.5  MCV 85.4 86.5  PLT 381 367   Basic Metabolic Panel: Recent Labs  Lab 12/18/19 1722 12/20/19 1003 12/21/19 0443  NA 140 139 140  K 4.2 3.6 3.7  CL 107 106 105  CO2 GLUCOSE 142* 120* 114*  BUN 14 16 21*  CREATININE 1.35* 1.35* 1.51*  CALCIUM 9.3 9.1 9.0  MG  --   --  2.3   GFR: Estimated Creatinine Clearance: 79.6 mL/min (A) (by C-G formula based on SCr of 1.51 mg/dL (H)). Liver Function Tests: Recent Labs  Lab 12/20/19 1003  AST 31  ALT 43  ALKPHOS 78  BILITOT 0.9  PROT 7.5  ALBUMIN 3.7   No results for input(s): LIPASE, AMYLASE in the last 168 hours. No results for input(s): AMMONIA in the last 168 hours. Coagulation Profile: No results for input(s): INR, PROTIME in the last 168 hours. Cardiac Enzymes: No results for input(s): CKTOTAL, CKMB, CKMBINDEX, TROPONINI in the last 168 hours. BNP (last 3 results) No results for input(s): PROBNP in the last 8760 hours. HbA1C: No results for input(s): HGBA1C in the last 72 hours. CBG: No results for input(s): GLUCAP in the last 168 hours. Lipid Profile: Recent Labs    12/21/19 0443  CHOL 183  HDL 28*  LDLCALC 142*  TRIG 67  CHOLHDL 6.5   Thyroid Function Tests: No results for input(s): TSH, T4TOTAL, FREET4, T3FREE, THYROIDAB in the last 72 hours. Anemia Panel: No results for input(s): VITAMINB12, FOLATE, FERRITIN, TIBC, IRON, RETICCTPCT in the last 72 hours. Sepsis Labs: No results for input(s): PROCALCITON, LATICACIDVEN in the last 168 hours.  Recent Results (from the past 240 hour(s))  SARS Coronavirus 2 by RT PCR (hospital order, performed in [facility] hospital lab) Nasopharyngeal  Nasopharyngeal Swab     Status: None   Collection Time: 12/18/19  5:16 PM   Specimen: Nasopharyngeal Swab  Result Value Ref Range Status   SARS Coronavirus 2 NEGATIVE NEGATIVE Final    Comment: (NOTE) SARS-CoV-2 target nucleic acids are NOT DETECTED.  The SARS-CoV-2 RNA is generally detectable in upper and lower respiratory specimens during the acute phase of infection. The lowest concentration of SARS-CoV-2 viral copies this assay can detect is 250 copies / mL. A negative result does not preclude SARS-CoV-2 infection and should not be used as the sole basis for treatment or other patient management decisions.  A negative result may occur with improper specimen collection / handling, submission of specimen other than nasopharyngeal swab, presence of viral mutation(s) within the areas targeted by this assay, and inadequate number of viral copies (<250 copies / mL). A negative result must be combined with clinical observations, patient history, and epidemiological information.  Fact Sheet for Patients:   BoilerBrush.com.cy  Fact Sheet for Healthcare Providers: https://pope.com/  This test is  not yet approved or  cleared by the [place] and has been authorized for detection and/or diagnosis of SARS-CoV-2 by FDA under an Emergency Use Authorization (EUA).  This EUA will remain in effect (meaning this test can be used) for the duration of the COVID-19 declaration under Section 564(b)(1) of the Act, 21 U.S.C. section 360bbb-3(b)(1), unless the authorization is terminated or revoked sooner.  Performed at [facility], [address] [place], [place] [address]   SARS Coronavirus 2 by RT PCR (hospital order, performed in [facility] hospital lab) Nasopharyngeal Nasopharyngeal Swab     Status: None   Collection Time: 12/20/19  2:21 PM   Specimen: Nasopharyngeal Swab  Result Value Ref Range Status   SARS Coronavirus 2  NEGATIVE NEGATIVE Final    Comment: (NOTE) SARS-CoV-2 target nucleic acids are NOT DETECTED.  The SARS-CoV-2 RNA is generally detectable in upper and lower respiratory specimens during the acute phase of infection. The lowest concentration of SARS-CoV-2 viral copies this assay can detect is 250 copies / mL. A negative result does not preclude SARS-CoV-2 infection and should not be used as the sole basis for treatment or other patient management decisions.  A negative result may occur with improper specimen collection / handling, submission of specimen other than nasopharyngeal swab, presence of viral mutation(s) within the areas targeted by this assay, and inadequate number of viral copies (<250 copies / mL). A negative result must be combined with clinical observations, patient history, and epidemiological information.  Fact Sheet for Patients:   BoilerBrush.com.cy  Fact Sheet for Healthcare Providers: https://pope.com/  This test is not yet approved or  cleared by the [place] FDA and has been authorized for detection and/or diagnosis of SARS-CoV-2 by FDA under an Emergency Use Authorization (EUA).  This EUA will remain in effect (meaning this test can be used) for the duration of the COVID-19 declaration under Section 564(b)(1) of the Act, 21 U.S.C. section 360bbb-3(b)(1), unless the authorization is terminated or revoked sooner.  Performed at [facility], [address], [place], [place] [address]      Radiology Studies: DG Chest 2 View  Result Date: 12/20/2019 CLINICAL DATA:  Shortness of breath after exertion. EXAM: CHEST - 2 VIEW COMPARISON:  12/18/2019 FINDINGS: Enlarged cardiopericardial silhouette, similar to prior. Mild vascular congestion. No pleural effusions. No discernible pneumothorax. No consolidation. No acute osseous abnormality. IMPRESSION: Similar cardiopericardial silhouette enlargement  and mild vascular congestion. No focal consolidation. Electronically Signed   By: Feliberto Harts MD   On: 12/20/2019 08:17   ECHOCARDIOGRAM COMPLETE  Result Date: 12/21/2019    ECHOCARDIOGRAM REPORT   Patient Name:   Eastin Dolley Date of Exam: 12/21/2019 Medical Rec #:  [id number]     Height:       72.0 in Accession #:    [id number]    Weight:       265.0 lb Date of Birth:  05/02/1971     BSA:          2.401 m Patient Age:    49 years      BP:           172/95 mmHg Patient Gender: M             HR:           67 bpm. Exam Location:  Inpatient Procedure: 2D Echo, Cardiac Doppler, Color Doppler and Intracardiac            Opacification Agent  Indications:    I50.23 Acute on chronic systolic (congestive) heart failure  History:        Patient has prior history of Echocardiogram examinations, most                 recent 09/22/2014. Cardiomegaly; Risk Factors:Hypertension.  Sonographer:    Tiffany Dance Referring Phys: [id number] Boyce Medici GONFA IMPRESSIONS  1. Left ventricular ejection fraction, by estimation, is <20%. The left ventricle has severely decreased function. The left ventricle demonstrates global hypokinesis. The left ventricular internal cavity size was moderately dilated. There is moderate concentric left ventricular hypertrophy. Left ventricular diastolic parameters are consistent with Grade III diastolic dysfunction (restrictive).  2. Right ventricular systolic function is severely reduced. The right ventricular size is moderately enlarged.  3. Left atrial size was severely dilated.  4. Right atrial size was mildly dilated.  5. The mitral valve is normal in structure. Mild mitral valve regurgitation. No evidence of mitral stenosis.  6. The aortic valve is normal in structure. Aortic valve regurgitation is not visualized. No aortic stenosis is present.  7. There is mild dilatation of the ascending aorta measuring 40 mm.  8. The inferior vena cava is normal in size with greater than 50% respiratory  variability, suggesting right atrial pressure of 3 mmHg. FINDINGS  Left Ventricle: Left ventricular ejection fraction, by estimation, is <20%. The left ventricle has severely decreased function. The left ventricle demonstrates global hypokinesis. Definity contrast agent was given IV to delineate the left ventricular endocardial borders. The left ventricular internal cavity size was moderately dilated. There is moderate concentric left ventricular hypertrophy. Left ventricular diastolic parameters are consistent with Grade III diastolic dysfunction (restrictive). Normal left ventricular filling pressure. Right Ventricle: The right ventricular size is moderately enlarged. No increase in right ventricular wall thickness. Right ventricular systolic function is severely reduced. Left Atrium: Left atrial size was severely dilated. Right Atrium: Right atrial size was mildly dilated. Pericardium: There is no evidence of pericardial effusion. Mitral Valve: The mitral valve is normal in structure. Normal mobility of the mitral valve leaflets. Mild mitral valve regurgitation. No evidence of mitral valve stenosis. Tricuspid Valve: The tricuspid valve is normal in structure. Tricuspid valve regurgitation is mild . No evidence of tricuspid stenosis. Aortic Valve: The aortic valve is normal in structure. Aortic valve regurgitation is not visualized. No aortic stenosis is present. Pulmonic Valve: The pulmonic valve was normal in structure. Pulmonic valve regurgitation is mild. No evidence of pulmonic stenosis. Aorta: The aortic root is normal in size and structure. There is mild dilatation of the ascending aorta measuring 40 mm. Venous: The inferior vena cava is normal in size with greater than 50% respiratory variability, suggesting right atrial pressure of 3 mmHg. IAS/Shunts: No atrial level shunt detected by color flow Doppler.  LEFT VENTRICLE PLAX 2D LVIDd:         6.00 cm LVIDs:         5.30 cm LV PW:         1.80 cm LV IVS:         1.40 cm LVOT diam:     2.20 cm LV SV:         39 LV SV Index:   16 LVOT Area:     3.80 cm  RIGHT VENTRICLE          IVC RV Basal diam:  4.10 cm  IVC diam: 2.00 cm RV Mid diam:    2.60 cm TAPSE (M-mode): 2.2 cm LEFT ATRIUM  Index       RIGHT ATRIUM           Index LA diam:      5.60 cm  2.33 cm/m  RA Area:     25.50 cm LA Vol (A2C): 133.0 ml 55.39 ml/m RA Volume:   80.70 ml  33.61 ml/m LA Vol (A4C): 59.0 ml  24.57 ml/m  AORTIC VALVE LVOT Vmax:   70.90 cm/s LVOT Vmean:  44.900 cm/s LVOT VTI:    0.102 m  AORTA Ao Root diam: 3.20 cm Ao Asc diam:  4.00 cm MITRAL VALVE MV Area (PHT): 6.74 cm    SHUNTS MV Decel Time: 113 msec    Systemic VTI:  0.10 m MV E velocity: 99.05 cm/s  Systemic Diam: 2.20 cm Tobias Alexander MD Electronically signed by Tobias Alexander MD Signature Date/Time: 12/21/2019/12:14:25 PM    Final     Scheduled Meds: . carvedilol  25 mg Oral BID WC  . enoxaparin (LOVENOX) injection  40 mg Subcutaneous Q24H  . isosorbide-hydrALAZINE  1 tablet Oral TID  . sodium chloride flush  3 mL Intravenous Q12H  . spironolactone  25 mg Oral Daily   Continuous Infusions: . sodium chloride       LOS: 1 day    Time spent: 35 mins.    Cipriano Bunker, MD [facility]   If 7PM-7AM, please contact night-coverage

## 2019-12-21 NOTE — ED Notes (Signed)
Patient eating breakfast at this time. Will take patient upstairs after he finishes eating.

## 2019-12-21 NOTE — ED Notes (Signed)
Unable to reach RN for report

## 2019-12-21 NOTE — Progress Notes (Signed)
  Echocardiogram 2D Echocardiogram has been performed.  Ronald Young 12/21/2019, 8:50 AM

## 2019-12-21 NOTE — Plan of Care (Signed)

## 2019-12-21 NOTE — Progress Notes (Signed)
Progress Note  Patient Name: Ronald Young Date of Encounter: 12/21/2019  [facility] Cardiologist: No primary care provider on file.   Subjective   BP remains elevated but improved (136/109).  I/Os not recorded.  Worsening renal function (creatinine 1.35 to 1.51).  Weight not recorded.  Reports dyspnea has improved.  Inpatient Medications    Scheduled Meds: . carvedilol  12.5 mg Oral BID WC  . enoxaparin (LOVENOX) injection  40 mg Subcutaneous Q24H  . furosemide  40 mg Intravenous BID  . losartan  25 mg Oral Daily  . sodium chloride flush  3 mL Intravenous Q12H  . spironolactone  25 mg Oral Daily   Continuous Infusions: . sodium chloride     PRN Meds: sodium chloride, acetaminophen, labetalol, ondansetron (ZOFRAN) IV, sodium chloride flush   Vital Signs    Vitals:   12/21/19 0530 12/21/19 0600 12/21/19 0630 12/21/19 0645  BP: (!) 142/94 (!) 144/102 (!) 157/116 (!) 136/109  Pulse: 71 66 80 66  Resp: (!) 35 (!) 24 (!) 33 13  Temp:      TempSrc:      SpO2: 98% 96% 97% 95%   No intake or output data in the 24 hours ending 12/21/19 0810 Last 3 Weights 12/18/2019 10/22/2014 10/13/2014  Weight (lbs) 265 lb 268 lb 266 lb 6.4 oz  Weight (kg) 120.203 kg 121.564 kg 120.838 kg      Telemetry    NSR in 60-70s - Personally Reviewed  ECG    No new ECG - Personally Reviewed  Physical Exam   GEN: No acute distress.   Neck: No JVD appreciated though difficult to assess given habitus Cardiac: RRR, no murmurs, rubs, or gallops.  Respiratory: Clear to auscultation bilaterally. GI: Soft, nontender, non-distended  MS: No edema; No deformity. Neuro:  Nonfocal  Psych: Normal affect   Labs    High Sensitivity Troponin:   Recent Labs  Lab 12/20/19 1003 12/20/19 1421  TROPONINIHS 147* 125*      Chemistry Recent Labs  Lab 12/18/19 1722 12/20/19 1003 12/21/19 0443  NA 140 139 140  K 4.2 3.6 3.7  CL 107 106 105  CO2 22 23 26   GLUCOSE 142* 120* 114*  BUN 14 16  21*  CREATININE 1.35* 1.35* 1.51*  CALCIUM 9.3 9.1 9.0  PROT  --  7.5  --   ALBUMIN  --  3.7  --   AST  --  31  --   ALT  --  43  --   ALKPHOS  --  78  --   BILITOT  --  0.9  --   GFRNONAA >60 >60 54*  GFRAA >60 >60 >60  ANIONGAP 11 10 9      Hematology Recent Labs  Lab 12/18/19 1722 12/20/19 1003  WBC 6.6 8.7  RBC 5.01 5.03  HGB 13.7 14.0  HCT 42.8 43.5  MCV 85.4 86.5  MCH 27.3 27.8  MCHC 32.0 32.2  RDW 14.9 14.9  PLT 381 367    BNP Recent Labs  Lab 12/20/19 1003  BNP 984.2*     DDimer No results for input(s): DDIMER in the last 168 hours.   Radiology    DG Chest 2 View  Result Date: 12/20/2019 CLINICAL DATA:  Shortness of breath after exertion. EXAM: CHEST - 2 VIEW COMPARISON:  12/18/2019 FINDINGS: Enlarged cardiopericardial silhouette, similar to prior. Mild vascular congestion. No pleural effusions. No discernible pneumothorax. No consolidation. No acute osseous abnormality. IMPRESSION: Similar cardiopericardial silhouette enlargement and mild  vascular congestion. No focal consolidation. Electronically Signed   By: Feliberto Harts MD   On: 12/20/2019 08:17    Cardiac Studies     Patient Profile     49 y.o. male with a hx of reported nonischemic cardiomyopathy (EF of 25% per echo in 2016 with Myoview in 2016 showing no perfusion defects),carotid artery disease (1-39% stenosis B/L), LVH, HTN, non-compliance, prior cocaine use who is being seen today for the evaluation of CHF   Assessment & Plan    Acute on chronic combined CHF: prior EF 25-30% in 2016.   Presumed NICM, had  Myoview in 2016 showing no perfusion defects.  Presents with sob and orthopnea. BP significantly elevated on admission given Labetalol injection. BNP up to 900. CXR with mild vascular congestion. HS troponin 147>125.  He has been off medication since 2016. -strict I/Os, monitor creatinine, daily weights - repeat echo ordered.  Will likely need RHC/LHC pending results of echo - started  on IV lasix 40 mg BID.  Appears euvolemic on exam and worsening renal function with diuresis, will hold lasix. - started on Coreg 12.5mg  BID, losartan 25mg  daily, spironolactone 25 mg daily.  BP remains elevated will increase to coreg 25 mg BID.  Given worsening renal function, will hold losartan for now and start BiDil 20-37.5 mg TID  Elevated troponin: troponin 147>125, likely demand ischemia in setting of hypertensive urgency and decompensated heart failure.  Patient had Myoview stress test in 2016 with EF 30-45% and no abnormal perfusion identified.  Patient denies chest pain although lives a very sedentary lifestyle - will check A1C and lipid panel - likely ischemic eval pending echo  Hypertensive urgency: history of uncontrolled HTN and not on medications for 5-6 years.  BP to 207/164 in the ED.  Started on coreg, losartan, aldactone, IV lasix - pressures slowly improving. BP remains elevated will increase to coreg 25 mg BID.  Given worsening renal function, will hold losartan for now and start BiDil 20-37.5 mg TID  AKI: unknown baseline renal function.  Cr 1.35 >1.51 with diuresis.  Appears euvolemic on exam, will hold IV lasix.  Hold losartan for now.  Noncompliance: difficulty affording meds in the past.  TOC consulted  History of cocaine use: denies recent use. UDS negative   For questions or updates, please contact [facility] Please consult [email or web address] for contact info under        Signed, 2017, MD  12/21/2019, 8:10 AM

## 2019-12-22 LAB — CBC
HCT: 35.6 % — ABNORMAL LOW (ref 39.0–52.0)
Hemoglobin: 11.8 g/dL — ABNORMAL LOW (ref 13.0–17.0)
MCH: 28.4 pg (ref 26.0–34.0)
MCHC: 33.1 g/dL (ref 30.0–36.0)
MCV: 85.6 fL (ref 80.0–100.0)
Platelets: 324 10*3/uL (ref 150–400)
RBC: 4.16 MIL/uL — ABNORMAL LOW (ref 4.22–5.81)
RDW: 15 % (ref 11.5–15.5)
WBC: 8.7 10*3/uL (ref 4.0–10.5)
nRBC: 0 % (ref 0.0–0.2)

## 2019-12-22 LAB — BASIC METABOLIC PANEL
Anion gap: 9 (ref 5–15)
BUN: 21 mg/dL — ABNORMAL HIGH (ref 6–20)
CO2: 23 mmol/L (ref 22–32)
Calcium: 8.6 mg/dL — ABNORMAL LOW (ref 8.9–10.3)
Chloride: 104 mmol/L (ref 98–111)
Creatinine, Ser: 1.36 mg/dL — ABNORMAL HIGH (ref 0.61–1.24)
GFR calc Af Amer: >60 mL/min (ref >60)
GFR calc non Af Amer: >60 mL/min (ref >60)
Glucose, Bld: 95 mg/dL (ref 70–99)
Potassium: 3.4 mmol/L — ABNORMAL LOW (ref 3.5–5.1)
Sodium: 136 mmol/L (ref 135–145)

## 2019-12-22 LAB — PHOSPHORUS: Phosphorus: 3.8 mg/dL (ref 2.5–4.6)

## 2019-12-22 LAB — MAGNESIUM: Magnesium: 2 mg/dL (ref 1.7–2.4)

## 2019-12-22 MED ORDER — POTASSIUM CHLORIDE 20 MEQ PO PACK
40.0000 meq | PACK | Freq: Once | ORAL | Status: AC
  Administered 2019-12-22: 40 meq via ORAL
  Filled 2019-12-22: qty 2

## 2019-12-22 NOTE — Progress Notes (Signed)
PROGRESS NOTE    Ronald Young  GTX:[id number] DOB: [birth year] DOA: 12/20/2019 PCP: Patient, No Pcp Per   Brief Narrative:  Ronald Young is a 49 y.o. male with history of combined CHF, HTN not on medications and cocaine use presenting with progressive shortness of breath, orthopnea and dry cough for about 2 weeks. Patient was diagnosed with combined CHF with EF of 25 to 30% and G2-DD in 2016.  At that time, he was discharged on diuretics and GDMT but never followed up.  He now presents with progressive shortness of breath, orthopnea and DOE over the last 2 weeks after his second dose of COVID-19 vaccination.  He denies PND, edema, chest pain or palpitation.  He denies family history of heart disease. Of note, patient presented to ED yesterday but seems to have eloped.   He is admitted for acute on chronic combined systolic and diastolic CHF.  Cardiology consulted recommended repeat echocardiogram which shows EF less than 20%.  Patient might need ischemic work-up.  Plan: Left and right heart catheterization tomorrow.   Assessment & Plan:   Active Problems:   Acute on chronic combined systolic and diastolic CHF (congestive heart failure) (HCC)   Acute on chronic combined systolic and diastolic CHF (congestive heart failure) (HCC) Echo in 2016 with EF of 25 to 30% and G2-DD.  Patient was lost to follow-up since 2016.  Has not been on any medications.  BNP elevated to 1000.  Mildly elevated troponin likely due to demand ischemia.  Chest x-ray consistent with CHF. -CHF pathway-IV Lasix 40 mg twice daily -Carvedilol 12.5 mg twice daily -Started losartan 25 mg daily -Cardiology consulted -Repeat ECHO, LVEF less than 20% -Monitor fluid status and renal functions -Counseled against NSAID use. -TOC consult for PCP and medication assistance. - Plan:  Left and right heart catheterization tomorrow.  Uncontrolled hypertension/hypertensive urgency -cardiac meds as above.  Elevated troponin:  Denies chest pain.  Pattern consistent with demand ischemia.  No evidence of acute ischemia on EKG. -Manage CHF and hypertension as above.  Hyperglycemia without diagnosis of diabetes -Check hemoglobin A1c.  Class II obesity -Encourage lifestyle change to lose weight.  History of cocaine use: Denies any recreational drug use recently. - UDS - Negative.  Care coordination-patient has no PCP or insurance.  Lost to follow-up when he was diagnosed with CHF in 2016. -TOC consulted   DVT prophylaxis: Lovenox Code Status: Full code Family Communication: d/w family at bedside. Disposition Plan:  Anticipated DC home in 1-2 days Consultants:   Cardiology   Procedures: Echocardiogram.  Antimicrobials: Anti-infectives (From admission, onward)   None      Subjective: Patient was seen and examined at bedside.  No overnight events.  Patient reports feeling much better.  He had echocardiogram , aware that he is going to have LHC/RHC tomorrow.   Objective: Vitals:   12/21/19 2044 12/21/19 2212 12/22/19 0500 12/22/19 0503  BP: 113/80 124/80  (!) 148/100  Pulse: 60 69  74  Resp: (!) 24 16  16   Temp: 97.8 F (36.6 C) (!) 97.4 F (36.3 C)  98.2 F (36.8 C)  TempSrc: Oral Oral  Oral  SpO2: 91% 95%  96%  Weight:   119.2 kg   Height:        Intake/Output Summary (Last 24 hours) at 12/22/2019 0815 Last data filed at 12/22/2019 0600 Gross per 24 hour  Intake 243 ml  Output 475 ml  Net -232 ml   Filed Weights   12/21/19 1019 12/22/19  0500  Weight: 118.8 kg 119.2 kg    Examination:  General exam: Appears calm and comfortable  Respiratory system: Clear to auscultation. Respiratory effort normal. Cardiovascular system: S1 & S2 heard, RRR. No JVD, murmurs, rubs, gallops or clicks. No pedal edema. Gastrointestinal system: Abdomen is nondistended, soft and nontender. No organomegaly or masses felt. Normal bowel sounds heard. Central nervous system: Alert and oriented. No focal  neurological deficits. Extremities: No leg swellings, no cyanosis,. Skin: No rashes, lesions or ulcers Psychiatry: Judgement and insight appear normal. Mood & affect appropriate.     Data Reviewed: I have personally reviewed following labs and imaging studies  CBC: Recent Labs  Lab 12/18/19 1722 12/20/19 1003 12/22/19 0530  WBC 6.6 8.7 8.7  NEUTROABS  --  5.7  --   HGB 13.7 14.0 11.8*  HCT 42.8 43.5 35.6*  MCV 85.4 86.5 85.6  PLT 381 367 324   Basic Metabolic Panel: Recent Labs  Lab 12/18/19 1722 12/20/19 1003 12/21/19 0443 12/22/19 0530  NA 140 139 140 136  K 4.2 3.6 3.7 3.4*  CL 107 106 105 104  CO2 22 23 26 23   GLUCOSE 142* 120* 114* 95  BUN 14 16 21* 21*  CREATININE 1.35* 1.35* 1.51* 1.36*  CALCIUM 9.3 9.1 9.0 8.6*  MG  --   --  2.3 2.0  PHOS  --   --   --  3.8   GFR: Estimated Creatinine Clearance: 88.5 mL/min (A) (by C-G formula based on SCr of 1.36 mg/dL (H)). Liver Function Tests: Recent Labs  Lab 12/20/19 1003  AST 31  ALT 43  ALKPHOS 78  BILITOT 0.9  PROT 7.5  ALBUMIN 3.7   No results for input(s): LIPASE, AMYLASE in the last 168 hours. No results for input(s): AMMONIA in the last 168 hours. Coagulation Profile: No results for input(s): INR, PROTIME in the last 168 hours. Cardiac Enzymes: No results for input(s): CKTOTAL, CKMB, CKMBINDEX, TROPONINI in the last 168 hours. BNP (last 3 results) No results for input(s): PROBNP in the last 8760 hours. HbA1C: No results for input(s): HGBA1C in the last 72 hours. CBG: No results for input(s): GLUCAP in the last 168 hours. Lipid Profile: Recent Labs    12/21/19 0443  CHOL 183  HDL 28*  LDLCALC 142*  TRIG 67  CHOLHDL 6.5   Thyroid Function Tests: No results for input(s): TSH, T4TOTAL, FREET4, T3FREE, THYROIDAB in the last 72 hours. Anemia Panel: No results for input(s): VITAMINB12, FOLATE, FERRITIN, TIBC, IRON, RETICCTPCT in the last 72 hours. Sepsis Labs: No results for input(s):  PROCALCITON, LATICACIDVEN in the last 168 hours.  Recent Results (from the past 240 hour(s))  SARS Coronavirus 2 by RT PCR (hospital order, performed in [facility] hospital lab) Nasopharyngeal Nasopharyngeal Swab     Status: None   Collection Time: 12/18/19  5:16 PM   Specimen: Nasopharyngeal Swab  Result Value Ref Range Status   SARS Coronavirus 2 NEGATIVE NEGATIVE Final    Comment: (NOTE) SARS-CoV-2 target nucleic acids are NOT DETECTED.  The SARS-CoV-2 RNA is generally detectable in upper and lower respiratory specimens during the acute phase of infection. The lowest concentration of SARS-CoV-2 viral copies this assay can detect is 250 copies / mL. A negative result does not preclude SARS-CoV-2 infection and should not be used as the sole basis for treatment or other patient management decisions.  A negative result may occur with improper specimen collection / handling, submission of specimen other than nasopharyngeal swab, presence of  viral mutation(s) within the areas targeted by this assay, and inadequate number of viral copies (<250 copies / mL). A negative result must be combined with clinical observations, patient history, and epidemiological information.  Fact Sheet for Patients:   BoilerBrush.com.cy  Fact Sheet for Healthcare Providers: https://pope.com/  This test is not yet approved or  cleared by the [place] FDA and has been authorized for detection and/or diagnosis of SARS-CoV-2 by FDA under an Emergency Use Authorization (EUA).  This EUA will remain in effect (meaning this test can be used) for the duration of the COVID-19 declaration under Section 564(b)(1) of the Act, 21 U.S.C. section 360bbb-3(b)(1), unless the authorization is terminated or revoked sooner.  Performed at [facility], [address] [place], [place] [address]   SARS Coronavirus 2 by RT PCR (hospital order, performed in [facility]  hospital lab) Nasopharyngeal Nasopharyngeal Swab     Status: None   Collection Time: 12/20/19  2:21 PM   Specimen: Nasopharyngeal Swab  Result Value Ref Range Status   SARS Coronavirus 2 NEGATIVE NEGATIVE Final    Comment: (NOTE) SARS-CoV-2 target nucleic acids are NOT DETECTED.  The SARS-CoV-2 RNA is generally detectable in upper and lower respiratory specimens during the acute phase of infection. The lowest concentration of SARS-CoV-2 viral copies this assay can detect is 250 copies / mL. A negative result does not preclude SARS-CoV-2 infection and should not be used as the sole basis for treatment or other patient management decisions.  A negative result may occur with improper specimen collection / handling, submission of specimen other than nasopharyngeal swab, presence of viral mutation(s) within the areas targeted by this assay, and inadequate number of viral copies (<250 copies / mL). A negative result must be combined with clinical observations, patient history, and epidemiological information.  Fact Sheet for Patients:   BoilerBrush.com.cy  Fact Sheet for Healthcare Providers: https://pope.com/  This test is not yet approved or  cleared by the [place] FDA and has been authorized for detection and/or diagnosis of SARS-CoV-2 by FDA under an Emergency Use Authorization (EUA).  This EUA will remain in effect (meaning this test can be used) for the duration of the COVID-19 declaration under Section 564(b)(1) of the Act, 21 U.S.C. section 360bbb-3(b)(1), unless the authorization is terminated or revoked sooner.  Performed at [facility], [address], [place], [place] [address]      Radiology Studies: ECHOCARDIOGRAM COMPLETE  Result Date: 12/21/2019    ECHOCARDIOGRAM REPORT   Patient Name:   Ronald Young Date of Exam: 12/21/2019 Medical Rec #:  [id number]     Height:       72.0 in Accession #:     [id number]    Weight:       265.0 lb Date of Birth:  Jun 30, 1970     BSA:          2.401 m Patient Age:    49 years      BP:           172/95 mmHg Patient Gender: M             HR:           67 bpm. Exam Location:  Inpatient Procedure: 2D Echo, Cardiac Doppler, Color Doppler and Intracardiac            Opacification Agent Indications:    I50.23 Acute on chronic systolic (congestive) heart failure  History:        Patient  has prior history of Echocardiogram examinations, most                 recent 09/22/2014. Cardiomegaly; Risk Factors:Hypertension.  Sonographer:    Tiffany Dance Referring Phys: [id number] Boyce Medici GONFA IMPRESSIONS  1. Left ventricular ejection fraction, by estimation, is <20%. The left ventricle has severely decreased function. The left ventricle demonstrates global hypokinesis. The left ventricular internal cavity size was moderately dilated. There is moderate concentric left ventricular hypertrophy. Left ventricular diastolic parameters are consistent with Grade III diastolic dysfunction (restrictive).  2. Right ventricular systolic function is severely reduced. The right ventricular size is moderately enlarged.  3. Left atrial size was severely dilated.  4. Right atrial size was mildly dilated.  5. The mitral valve is normal in structure. Mild mitral valve regurgitation. No evidence of mitral stenosis.  6. The aortic valve is normal in structure. Aortic valve regurgitation is not visualized. No aortic stenosis is present.  7. There is mild dilatation of the ascending aorta measuring 40 mm.  8. The inferior vena cava is normal in size with greater than 50% respiratory variability, suggesting right atrial pressure of 3 mmHg. FINDINGS  Left Ventricle: Left ventricular ejection fraction, by estimation, is <20%. The left ventricle has severely decreased function. The left ventricle demonstrates global hypokinesis. Definity contrast agent was given IV to delineate the left ventricular endocardial borders.  The left ventricular internal cavity size was moderately dilated. There is moderate concentric left ventricular hypertrophy. Left ventricular diastolic parameters are consistent with Grade III diastolic dysfunction (restrictive). Normal left ventricular filling pressure. Right Ventricle: The right ventricular size is moderately enlarged. No increase in right ventricular wall thickness. Right ventricular systolic function is severely reduced. Left Atrium: Left atrial size was severely dilated. Right Atrium: Right atrial size was mildly dilated. Pericardium: There is no evidence of pericardial effusion. Mitral Valve: The mitral valve is normal in structure. Normal mobility of the mitral valve leaflets. Mild mitral valve regurgitation. No evidence of mitral valve stenosis. Tricuspid Valve: The tricuspid valve is normal in structure. Tricuspid valve regurgitation is mild . No evidence of tricuspid stenosis. Aortic Valve: The aortic valve is normal in structure. Aortic valve regurgitation is not visualized. No aortic stenosis is present. Pulmonic Valve: The pulmonic valve was normal in structure. Pulmonic valve regurgitation is mild. No evidence of pulmonic stenosis. Aorta: The aortic root is normal in size and structure. There is mild dilatation of the ascending aorta measuring 40 mm. Venous: The inferior vena cava is normal in size with greater than 50% respiratory variability, suggesting right atrial pressure of 3 mmHg. IAS/Shunts: No atrial level shunt detected by color flow Doppler.  LEFT VENTRICLE PLAX 2D LVIDd:         6.00 cm LVIDs:         5.30 cm LV PW:         1.80 cm LV IVS:        1.40 cm LVOT diam:     2.20 cm LV SV:         39 LV SV Index:   16 LVOT Area:     3.80 cm  RIGHT VENTRICLE          IVC RV Basal diam:  4.10 cm  IVC diam: 2.00 cm RV Mid diam:    2.60 cm TAPSE (M-mode): 2.2 cm LEFT ATRIUM            Index       RIGHT ATRIUM  Index LA diam:      5.60 cm  2.33 cm/m  RA Area:     25.50 cm  LA Vol (A2C): 133.0 ml 55.39 ml/m RA Volume:   80.70 ml  33.61 ml/m LA Vol (A4C): 59.0 ml  24.57 ml/m  AORTIC VALVE LVOT Vmax:   70.90 cm/s LVOT Vmean:  44.900 cm/s LVOT VTI:    0.102 m  AORTA Ao Root diam: 3.20 cm Ao Asc diam:  4.00 cm MITRAL VALVE MV Area (PHT): 6.74 cm    SHUNTS MV Decel Time: 113 msec    Systemic VTI:  0.10 m MV E velocity: 99.05 cm/s  Systemic Diam: 2.20 cm Tobias Alexander MD Electronically signed by Tobias Alexander MD Signature Date/Time: 12/21/2019/12:14:25 PM    Final     Scheduled Meds: . carvedilol  25 mg Oral BID WC  . enoxaparin (LOVENOX) injection  40 mg Subcutaneous Q24H  . isosorbide-hydrALAZINE  1 tablet Oral TID  . potassium chloride  40 mEq Oral Once  . sodium chloride flush  3 mL Intravenous Q12H  . spironolactone  25 mg Oral Daily   Continuous Infusions: . sodium chloride       LOS: 2 days    Time spent: 35 mins.    Cipriano Bunker, MD [facility]   If 7PM-7AM, please contact night-coverage

## 2019-12-22 NOTE — H&P (View-Only) (Signed)
 Progress Note  Patient Name: Ronald Young Date of Encounter: 12/22/2019  [facility] Cardiologist: No primary care provider on file.   Subjective   BP improved (124/80 last night, increased to 148/100 this morning).  Renal function improved with holding diuresis (Cr 1.51 > 1.36).  He denies any chest pain, dyspnea, or lightheadedness.   Inpatient Medications    Scheduled Meds: . carvedilol  25 mg Oral BID WC  . enoxaparin (LOVENOX) injection  40 mg Subcutaneous Q24H  . isosorbide-hydrALAZINE  1 tablet Oral TID  . sodium chloride flush  3 mL Intravenous Q12H  . spironolactone  25 mg Oral Daily   Continuous Infusions: . sodium chloride     PRN Meds: sodium chloride, acetaminophen, labetalol, melatonin, ondansetron (ZOFRAN) IV, sodium chloride flush   Vital Signs    Vitals:   12/21/19 2044 12/21/19 2212 12/22/19 0500 12/22/19 0503  BP: 113/80 124/80  (!) 148/100  Pulse: 60 69  74  Resp: (!) 24 16  16  Temp: 97.8 F (36.6 C) (!) 97.4 F (36.3 C)  98.2 F (36.8 C)  TempSrc: Oral Oral  Oral  SpO2: 91% 95%  96%  Weight:   119.2 kg   Height:        Intake/Output Summary (Last 24 hours) at 12/22/2019 0658 Last data filed at 12/22/2019 0600 Gross per 24 hour  Intake 243 ml  Output 475 ml  Net -232 ml   Last 3 Weights 12/22/2019 12/21/2019 12/18/2019  Weight (lbs) 262 lb 12.8 oz 261 lb 14.4 oz 265 lb  Weight (kg) 119.205 kg 118.797 kg 120.203 kg      Telemetry    NSR in 60-70s, PVCs, NSVT x 4 beats - Personally Reviewed  ECG    No new ECG - Personally Reviewed  Physical Exam   GEN: No acute distress.   Neck: No JVD appreciated though difficult to assess given habitus Cardiac: RRR, no murmurs, rubs, or gallops.  Respiratory: Clear to auscultation bilaterally. GI: Soft, nontender, non-distended  MS: No edema; No deformity. Neuro:  Nonfocal  Psych: Normal affect   Labs    High Sensitivity Troponin:   Recent Labs  Lab 12/20/19 1003 12/20/19 1421    TROPONINIHS 147* 125*      Chemistry Recent Labs  Lab 12/18/19 1722 12/20/19 1003 12/21/19 0443  NA 140 139 140  K 4.2 3.6 3.7  CL 107 106 105  CO2 22 23 26  GLUCOSE 142* 120* 114*  BUN 14 16 21*  CREATININE 1.35* 1.35* 1.51*  CALCIUM 9.3 9.1 9.0  PROT  --  7.5  --   ALBUMIN  --  3.7  --   AST  --  31  --   ALT  --  43  --   ALKPHOS  --  78  --   BILITOT  --  0.9  --   GFRNONAA >60 >60 54*  GFRAA >60 >60 >60  ANIONGAP 11 10 9     Hematology Recent Labs  Lab 12/18/19 1722 12/20/19 1003 12/22/19 0530  WBC 6.6 8.7 8.7  RBC 5.01 5.03 4.16*  HGB 13.7 14.0 11.8*  HCT 42.8 43.5 35.6*  MCV 85.4 86.5 85.6  MCH 27.3 27.8 28.4  MCHC 32.0 32.2 33.1  RDW 14.9 14.9 15.0  PLT 381 367 324    BNP Recent Labs  Lab 12/20/19 1003  BNP 984.2*     DDimer No results for input(s): DDIMER in the last 168 hours.   Radiology      DG Chest 2 View  Result Date: 12/20/2019 CLINICAL DATA:  Shortness of breath after exertion. EXAM: CHEST - 2 VIEW COMPARISON:  12/18/2019 FINDINGS: Enlarged cardiopericardial silhouette, similar to prior. Mild vascular congestion. No pleural effusions. No discernible pneumothorax. No consolidation. No acute osseous abnormality. IMPRESSION: Similar cardiopericardial silhouette enlargement and mild vascular congestion. No focal consolidation. Electronically Signed   By: Feliberto Harts MD   On: 12/20/2019 08:17   ECHOCARDIOGRAM COMPLETE  Result Date: 12/21/2019    ECHOCARDIOGRAM REPORT   Patient Name:   Ronald Young Date of Exam: 12/21/2019 Medical Rec #:  [id number]     Height:       72.0 in Accession #:    [id number]    Weight:       265.0 lb Date of Birth:  09/12/1970     BSA:          2.401 m Patient Age:    49 years      BP:           172/95 mmHg Patient Gender: M             HR:           67 bpm. Exam Location:  Inpatient Procedure: 2D Echo, Cardiac Doppler, Color Doppler and Intracardiac            Opacification Agent Indications:    I50.23 Acute on  chronic systolic (congestive) heart failure  History:        Patient has prior history of Echocardiogram examinations, most                 recent 09/22/2014. Cardiomegaly; Risk Factors:Hypertension.  Sonographer:    Tiffany Dance Referring Phys: [id number] Boyce Medici GONFA IMPRESSIONS  1. Left ventricular ejection fraction, by estimation, is <20%. The left ventricle has severely decreased function. The left ventricle demonstrates global hypokinesis. The left ventricular internal cavity size was moderately dilated. There is moderate concentric left ventricular hypertrophy. Left ventricular diastolic parameters are consistent with Grade III diastolic dysfunction (restrictive).  2. Right ventricular systolic function is severely reduced. The right ventricular size is moderately enlarged.  3. Left atrial size was severely dilated.  4. Right atrial size was mildly dilated.  5. The mitral valve is normal in structure. Mild mitral valve regurgitation. No evidence of mitral stenosis.  6. The aortic valve is normal in structure. Aortic valve regurgitation is not visualized. No aortic stenosis is present.  7. There is mild dilatation of the ascending aorta measuring 40 mm.  8. The inferior vena cava is normal in size with greater than 50% respiratory variability, suggesting right atrial pressure of 3 mmHg. FINDINGS  Left Ventricle: Left ventricular ejection fraction, by estimation, is <20%. The left ventricle has severely decreased function. The left ventricle demonstrates global hypokinesis. Definity contrast agent was given IV to delineate the left ventricular endocardial borders. The left ventricular internal cavity size was moderately dilated. There is moderate concentric left ventricular hypertrophy. Left ventricular diastolic parameters are consistent with Grade III diastolic dysfunction (restrictive). Normal left ventricular filling pressure. Right Ventricle: The right ventricular size is moderately enlarged. No increase in  right ventricular wall thickness. Right ventricular systolic function is severely reduced. Left Atrium: Left atrial size was severely dilated. Right Atrium: Right atrial size was mildly dilated. Pericardium: There is no evidence of pericardial effusion. Mitral Valve: The mitral valve is normal in structure. Normal mobility of the mitral valve leaflets. Mild mitral valve regurgitation. No evidence of mitral valve stenosis.  Tricuspid Valve: The tricuspid valve is normal in structure. Tricuspid valve regurgitation is mild . No evidence of tricuspid stenosis. Aortic Valve: The aortic valve is normal in structure. Aortic valve regurgitation is not visualized. No aortic stenosis is present. Pulmonic Valve: The pulmonic valve was normal in structure. Pulmonic valve regurgitation is mild. No evidence of pulmonic stenosis. Aorta: The aortic root is normal in size and structure. There is mild dilatation of the ascending aorta measuring 40 mm. Venous: The inferior vena cava is normal in size with greater than 50% respiratory variability, suggesting right atrial pressure of 3 mmHg. IAS/Shunts: No atrial level shunt detected by color flow Doppler.  LEFT VENTRICLE PLAX 2D LVIDd:         6.00 cm LVIDs:         5.30 cm LV PW:         1.80 cm LV IVS:        1.40 cm LVOT diam:     2.20 cm LV SV:         39 LV SV Index:   16 LVOT Area:     3.80 cm  RIGHT VENTRICLE          IVC RV Basal diam:  4.10 cm  IVC diam: 2.00 cm RV Mid diam:    2.60 cm TAPSE (M-mode): 2.2 cm LEFT ATRIUM            Index       RIGHT ATRIUM           Index LA diam:      5.60 cm  2.33 cm/m  RA Area:     25.50 cm LA Vol (A2C): 133.0 ml 55.39 ml/m RA Volume:   80.70 ml  33.61 ml/m LA Vol (A4C): 59.0 ml  24.57 ml/m  AORTIC VALVE LVOT Vmax:   70.90 cm/s LVOT Vmean:  44.900 cm/s LVOT VTI:    0.102 m  AORTA Ao Root diam: 3.20 cm Ao Asc diam:  4.00 cm MITRAL VALVE MV Area (PHT): 6.74 cm    SHUNTS MV Decel Time: 113 msec    Systemic VTI:  0.10 m MV E velocity:  99.05 cm/s  Systemic Diam: 2.20 cm Tobias Alexander MD Electronically signed by Tobias Alexander MD Signature Date/Time: 12/21/2019/12:14:25 PM    Final     Cardiac Studies   Echo 12/21/19: 1. Left ventricular ejection fraction, by estimation, is <20%. The left  ventricle has severely decreased function. The left ventricle demonstrates  global hypokinesis. The left ventricular internal cavity size was  moderately dilated. There is moderate  concentric left ventricular hypertrophy. Left ventricular diastolic  parameters are consistent with Grade III diastolic dysfunction  (restrictive).  2. Right ventricular systolic function is severely reduced. The right  ventricular size is moderately enlarged.  3. Left atrial size was severely dilated.  4. Right atrial size was mildly dilated.  5. The mitral valve is normal in structure. Mild mitral valve  regurgitation. No evidence of mitral stenosis.  6. The aortic valve is normal in structure. Aortic valve regurgitation is  not visualized. No aortic stenosis is present.  7. There is mild dilatation of the ascending aorta measuring 40 mm.  8. The inferior vena cava is normal in size with greater than 50%  respiratory variability, suggesting right atrial pressure of 3 mmHg.   Patient Profile     49 y.o. male with a hx of reported nonischemic cardiomyopathy (EF of 25% per echo in 2016 with Myoview in 2016  showing no perfusion defects),carotid artery disease (1-39% stenosis B/L), LVH, HTN, non-compliance, prior cocaine use who is being seen today for the evaluation of CHF   Assessment & Plan    Acute on chronic combined CHF: prior EF 25-30% in 2016.   Presumed NICM, had  Myoview in 2016 showing no perfusion defects.  Presents with sob and orthopnea. BP significantly elevated on admission.  BNP up to 900. CXR with mild vascular congestion. HS troponin 147>125.  He has been off medication since 2016.  Repeat echo shows EF <20%, severe RV  dysfunction.  Suspect hypertensive CM, but will need to rule out ischemia - strict I/Os, monitor creatinine, daily weights - plan RHC/LHC tomorrow.  Risks and benefits of cardiac catheterization have been discussed with the patient.  These include bleeding, infection, kidney damage, stroke, heart attack, death.  The patient understands these risks and is willing to proceed. - started on IV lasix 40 mg BID initially.  Appears euvolemic on exam and IVC small/collapsible on echo and renal function worsened with diuresis, will hold lasix for now and f/u RHC results - continue Coreg 25mg  BID, spironolactone 25 mg daily, and BiDil 20-37.5 mg TID.  Will plan to add losartan after cath if stable renal function; ideally would start entresto but expense is likely prohibitive as patient does not have insurance.  Will also not plan on discharging on BiDil due to expense, will plan to switch to hydralazine and imdur  Elevated troponin: troponin 147>125, likely demand ischemia in setting of hypertensive urgency and decompensated heart failure.  Patient had Myoview stress test in 2016 with EF 30-45% and no abnormal perfusion identified.  Patient denies chest pain although lives a very sedentary lifestyle - plan RHC/LHC as above  Hypertensive urgency: history of uncontrolled HTN and not on medications for 5-6 years.  BP to 207/164 in the ED.  Started on coreg, losartan, aldactone, IV lasix - pressures improving.  continue Coreg 25mg  BID, spironolactone 25 mg daily, and BiDil 20-37.5 mg TID.  AKI: unknown baseline renal function.  Cr 1.35 >1.51 with diuresis.  Cr improved to 1.36 today. Appears euvolemic on exam, will continue to hold lasix and losartan for now.  Frequent PVCs: with 4 beat run of NSVT.  Maintain K>4, Mg >2.  Noncompliance: difficulty affording meds in the past.  TOC consulted  History of cocaine use: denies recent use. UDS negative   For questions or updates, please contact [facility] Please consult [email or web address] for contact info under        Signed, 2017, MD  12/22/2019, 6:58 AM

## 2019-12-22 NOTE — Progress Notes (Addendum)
Progress Note  Patient Name: Ronald Young Date of Encounter: 12/22/2019  [facility] Cardiologist: No primary care provider on file.   Subjective   BP improved (124/80 last night, increased to 148/100 this morning).  Renal function improved with holding diuresis (Cr 1.51 > 1.36).  He denies any chest pain, dyspnea, or lightheadedness.   Inpatient Medications    Scheduled Meds: . carvedilol  25 mg Oral BID WC  . enoxaparin (LOVENOX) injection  40 mg Subcutaneous Q24H  . isosorbide-hydrALAZINE  1 tablet Oral TID  . sodium chloride flush  3 mL Intravenous Q12H  . spironolactone  25 mg Oral Daily   Continuous Infusions: . sodium chloride     PRN Meds: sodium chloride, acetaminophen, labetalol, melatonin, ondansetron (ZOFRAN) IV, sodium chloride flush   Vital Signs    Vitals:   12/21/19 2044 12/21/19 2212 12/22/19 0500 12/22/19 0503  BP: 113/80 124/80  (!) 148/100  Pulse: 60 69  74  Resp: (!) 24 16  16   Temp: 97.8 F (36.6 C) (!) 97.4 F (36.3 C)  98.2 F (36.8 C)  TempSrc: Oral Oral  Oral  SpO2: 91% 95%  96%  Weight:   119.2 kg   Height:        Intake/Output Summary (Last 24 hours) at 12/22/2019 0658 Last data filed at 12/22/2019 0600 Gross per 24 hour  Intake 243 ml  Output 475 ml  Net -232 ml   Last 3 Weights 12/22/2019 12/21/2019 12/18/2019  Weight (lbs) 262 lb 12.8 oz 261 lb 14.4 oz 265 lb  Weight (kg) 119.205 kg 118.797 kg 120.203 kg      Telemetry    NSR in 60-70s, PVCs, NSVT x 4 beats - Personally Reviewed  ECG    No new ECG - Personally Reviewed  Physical Exam   GEN: No acute distress.   Neck: No JVD appreciated though difficult to assess given habitus Cardiac: RRR, no murmurs, rubs, or gallops.  Respiratory: Clear to auscultation bilaterally. GI: Soft, nontender, non-distended  MS: No edema; No deformity. Neuro:  Nonfocal  Psych: Normal affect   Labs    High Sensitivity Troponin:   Recent Labs  Lab 12/20/19 1003 12/20/19 1421    TROPONINIHS 147* 125*      Chemistry Recent Labs  Lab 12/18/19 1722 12/20/19 1003 12/21/19 0443  NA 140 139 140  K 4.2 3.6 3.7  CL 107 106 105  CO2 22 23 26   GLUCOSE 142* 120* 114*  BUN 14 16 21*  CREATININE 1.35* 1.35* 1.51*  CALCIUM 9.3 9.1 9.0  PROT  --  7.5  --   ALBUMIN  --  3.7  --   AST  --  31  --   ALT  --  43  --   ALKPHOS  --  78  --   BILITOT  --  0.9  --   GFRNONAA >60 >60 54*  GFRAA >60 >60 >60  ANIONGAP 11 10 9      Hematology Recent Labs  Lab 12/18/19 1722 12/20/19 1003 12/22/19 0530  WBC 6.6 8.7 8.7  RBC 5.01 5.03 4.16*  HGB 13.7 14.0 11.8*  HCT 42.8 43.5 35.6*  MCV 85.4 86.5 85.6  MCH 27.3 27.8 28.4  MCHC 32.0 32.2 33.1  RDW 14.9 14.9 15.0  PLT 381 367 324    BNP Recent Labs  Lab 12/20/19 1003  BNP 984.2*     DDimer No results for input(s): DDIMER in the last 168 hours.   Radiology  DG Chest 2 View  Result Date: 12/20/2019 CLINICAL DATA:  Shortness of breath after exertion. EXAM: CHEST - 2 VIEW COMPARISON:  12/18/2019 FINDINGS: Enlarged cardiopericardial silhouette, similar to prior. Mild vascular congestion. No pleural effusions. No discernible pneumothorax. No consolidation. No acute osseous abnormality. IMPRESSION: Similar cardiopericardial silhouette enlargement and mild vascular congestion. No focal consolidation. Electronically Signed   By: Feliberto Harts MD   On: 12/20/2019 08:17   ECHOCARDIOGRAM COMPLETE  Result Date: 12/21/2019    ECHOCARDIOGRAM REPORT   Patient Name:   Ronald Young Date of Exam: 12/21/2019 Medical Rec #:  [id number]     Height:       72.0 in Accession #:    [id number]    Weight:       265.0 lb Date of Birth:  09/12/1970     BSA:          2.401 m Patient Age:    49 years      BP:           172/95 mmHg Patient Gender: M             HR:           67 bpm. Exam Location:  Inpatient Procedure: 2D Echo, Cardiac Doppler, Color Doppler and Intracardiac            Opacification Agent Indications:    I50.23 Acute on  chronic systolic (congestive) heart failure  History:        Patient has prior history of Echocardiogram examinations, most                 recent 09/22/2014. Cardiomegaly; Risk Factors:Hypertension.  Sonographer:    Tiffany Dance Referring Phys: [id number] Boyce Medici GONFA IMPRESSIONS  1. Left ventricular ejection fraction, by estimation, is <20%. The left ventricle has severely decreased function. The left ventricle demonstrates global hypokinesis. The left ventricular internal cavity size was moderately dilated. There is moderate concentric left ventricular hypertrophy. Left ventricular diastolic parameters are consistent with Grade III diastolic dysfunction (restrictive).  2. Right ventricular systolic function is severely reduced. The right ventricular size is moderately enlarged.  3. Left atrial size was severely dilated.  4. Right atrial size was mildly dilated.  5. The mitral valve is normal in structure. Mild mitral valve regurgitation. No evidence of mitral stenosis.  6. The aortic valve is normal in structure. Aortic valve regurgitation is not visualized. No aortic stenosis is present.  7. There is mild dilatation of the ascending aorta measuring 40 mm.  8. The inferior vena cava is normal in size with greater than 50% respiratory variability, suggesting right atrial pressure of 3 mmHg. FINDINGS  Left Ventricle: Left ventricular ejection fraction, by estimation, is <20%. The left ventricle has severely decreased function. The left ventricle demonstrates global hypokinesis. Definity contrast agent was given IV to delineate the left ventricular endocardial borders. The left ventricular internal cavity size was moderately dilated. There is moderate concentric left ventricular hypertrophy. Left ventricular diastolic parameters are consistent with Grade III diastolic dysfunction (restrictive). Normal left ventricular filling pressure. Right Ventricle: The right ventricular size is moderately enlarged. No increase in  right ventricular wall thickness. Right ventricular systolic function is severely reduced. Left Atrium: Left atrial size was severely dilated. Right Atrium: Right atrial size was mildly dilated. Pericardium: There is no evidence of pericardial effusion. Mitral Valve: The mitral valve is normal in structure. Normal mobility of the mitral valve leaflets. Mild mitral valve regurgitation. No evidence of mitral valve stenosis.  Tricuspid Valve: The tricuspid valve is normal in structure. Tricuspid valve regurgitation is mild . No evidence of tricuspid stenosis. Aortic Valve: The aortic valve is normal in structure. Aortic valve regurgitation is not visualized. No aortic stenosis is present. Pulmonic Valve: The pulmonic valve was normal in structure. Pulmonic valve regurgitation is mild. No evidence of pulmonic stenosis. Aorta: The aortic root is normal in size and structure. There is mild dilatation of the ascending aorta measuring 40 mm. Venous: The inferior vena cava is normal in size with greater than 50% respiratory variability, suggesting right atrial pressure of 3 mmHg. IAS/Shunts: No atrial level shunt detected by color flow Doppler.  LEFT VENTRICLE PLAX 2D LVIDd:         6.00 cm LVIDs:         5.30 cm LV PW:         1.80 cm LV IVS:        1.40 cm LVOT diam:     2.20 cm LV SV:         39 LV SV Index:   16 LVOT Area:     3.80 cm  RIGHT VENTRICLE          IVC RV Basal diam:  4.10 cm  IVC diam: 2.00 cm RV Mid diam:    2.60 cm TAPSE (M-mode): 2.2 cm LEFT ATRIUM            Index       RIGHT ATRIUM           Index LA diam:      5.60 cm  2.33 cm/m  RA Area:     25.50 cm LA Vol (A2C): 133.0 ml 55.39 ml/m RA Volume:   80.70 ml  33.61 ml/m LA Vol (A4C): 59.0 ml  24.57 ml/m  AORTIC VALVE LVOT Vmax:   70.90 cm/s LVOT Vmean:  44.900 cm/s LVOT VTI:    0.102 m  AORTA Ao Root diam: 3.20 cm Ao Asc diam:  4.00 cm MITRAL VALVE MV Area (PHT): 6.74 cm    SHUNTS MV Decel Time: 113 msec    Systemic VTI:  0.10 m MV E velocity:  99.05 cm/s  Systemic Diam: 2.20 cm Tobias Alexander MD Electronically signed by Tobias Alexander MD Signature Date/Time: 12/21/2019/12:14:25 PM    Final     Cardiac Studies   Echo 12/21/19: 1. Left ventricular ejection fraction, by estimation, is <20%. The left  ventricle has severely decreased function. The left ventricle demonstrates  global hypokinesis. The left ventricular internal cavity size was  moderately dilated. There is moderate  concentric left ventricular hypertrophy. Left ventricular diastolic  parameters are consistent with Grade III diastolic dysfunction  (restrictive).  2. Right ventricular systolic function is severely reduced. The right  ventricular size is moderately enlarged.  3. Left atrial size was severely dilated.  4. Right atrial size was mildly dilated.  5. The mitral valve is normal in structure. Mild mitral valve  regurgitation. No evidence of mitral stenosis.  6. The aortic valve is normal in structure. Aortic valve regurgitation is  not visualized. No aortic stenosis is present.  7. There is mild dilatation of the ascending aorta measuring 40 mm.  8. The inferior vena cava is normal in size with greater than 50%  respiratory variability, suggesting right atrial pressure of 3 mmHg.   Patient Profile     49 y.o. male with a hx of reported nonischemic cardiomyopathy (EF of 25% per echo in 2016 with Myoview in 2016  showing no perfusion defects),carotid artery disease (1-39% stenosis B/L), LVH, HTN, non-compliance, prior cocaine use who is being seen today for the evaluation of CHF   Assessment & Plan    Acute on chronic combined CHF: prior EF 25-30% in 2016.   Presumed NICM, had  Myoview in 2016 showing no perfusion defects.  Presents with sob and orthopnea. BP significantly elevated on admission.  BNP up to 900. CXR with mild vascular congestion. HS troponin 147>125.  He has been off medication since 2016.  Repeat echo shows EF <20%, severe RV  dysfunction.  Suspect hypertensive CM, but will need to rule out ischemia - strict I/Os, monitor creatinine, daily weights - plan RHC/LHC tomorrow.  Risks and benefits of cardiac catheterization have been discussed with the patient.  These include bleeding, infection, kidney damage, stroke, heart attack, death.  The patient understands these risks and is willing to proceed. - started on IV lasix 40 mg BID initially.  Appears euvolemic on exam and IVC small/collapsible on echo and renal function worsened with diuresis, will hold lasix for now and f/u RHC results - continue Coreg 25mg  BID, spironolactone 25 mg daily, and BiDil 20-37.5 mg TID.  Will plan to add losartan after cath if stable renal function; ideally would start entresto but expense is likely prohibitive as patient does not have insurance.  Will also not plan on discharging on BiDil due to expense, will plan to switch to hydralazine and imdur  Elevated troponin: troponin 147>125, likely demand ischemia in setting of hypertensive urgency and decompensated heart failure.  Patient had Myoview stress test in 2016 with EF 30-45% and no abnormal perfusion identified.  Patient denies chest pain although lives a very sedentary lifestyle - plan RHC/LHC as above  Hypertensive urgency: history of uncontrolled HTN and not on medications for 5-6 years.  BP to 207/164 in the ED.  Started on coreg, losartan, aldactone, IV lasix - pressures improving.  continue Coreg 25mg  BID, spironolactone 25 mg daily, and BiDil 20-37.5 mg TID.  AKI: unknown baseline renal function.  Cr 1.35 >1.51 with diuresis.  Cr improved to 1.36 today. Appears euvolemic on exam, will continue to hold lasix and losartan for now.  Frequent PVCs: with 4 beat run of NSVT.  Maintain K>4, Mg >2.  Noncompliance: difficulty affording meds in the past.  TOC consulted  History of cocaine use: denies recent use. UDS negative   For questions or updates, please contact [facility] Please consult [email or web address] for contact info under        Signed, 2017, MD  12/22/2019, 6:58 AM

## 2019-12-22 NOTE — Progress Notes (Signed)
Notified by CCMD that pt had an 18 beat run of V Tach. Pt asymptomatic. Currently NSR at 73. NP on call, [place], notified. Pt expected to go for heart cath tomorrow. Verbalizes understanding of NPO at MN.

## 2019-12-22 NOTE — Progress Notes (Signed)
CCMD called to inform this RN of patient having frequent ventricular bigeminy. Patient sustained this rhythm for approximately 10 minutes. Patient asymptomatic. MD Lucianne Muss made aware. No new orders. Will continue monitor patient.

## 2019-12-22 NOTE — Plan of Care (Signed)

## 2019-12-23 ENCOUNTER — Encounter (HOSPITAL_COMMUNITY): Payer: Self-pay | Admitting: Cardiovascular Disease

## 2019-12-23 ENCOUNTER — Encounter (HOSPITAL_COMMUNITY)
Admission: EM | Disposition: A | Discharge status: Home / Self Care | Payer: Self-pay | Source: Home / Self Care | Attending: Family Medicine

## 2019-12-23 DIAGNOSIS — I472 Ventricular tachycardia: Secondary | ICD-10-CM

## 2019-12-23 HISTORY — PX: RIGHT/LEFT HEART CATH AND CORONARY ANGIOGRAPHY: CATH118266

## 2019-12-23 LAB — CBC
HCT: 34.4 % — ABNORMAL LOW (ref 39.0–52.0)
Hemoglobin: 11.2 g/dL — ABNORMAL LOW (ref 13.0–17.0)
MCH: 28.3 pg (ref 26.0–34.0)
MCHC: 32.6 g/dL (ref 30.0–36.0)
MCV: 86.9 fL (ref 80.0–100.0)
Platelets: 315 10*3/uL (ref 150–400)
RBC: 3.96 MIL/uL — ABNORMAL LOW (ref 4.22–5.81)
RDW: 15.1 % (ref 11.5–15.5)
WBC: 8.5 10*3/uL (ref 4.0–10.5)
nRBC: 0 % (ref 0.0–0.2)

## 2019-12-23 LAB — BASIC METABOLIC PANEL
Anion gap: 11 (ref 5–15)
BUN: 22 mg/dL — ABNORMAL HIGH (ref 6–20)
CO2: 23 mmol/L (ref 22–32)
Calcium: 9.2 mg/dL (ref 8.9–10.3)
Chloride: 106 mmol/L (ref 98–111)
Creatinine, Ser: 1.31 mg/dL — ABNORMAL HIGH (ref 0.61–1.24)
GFR calc Af Amer: >60 mL/min (ref >60)
GFR calc non Af Amer: >60 mL/min (ref >60)
Glucose, Bld: 109 mg/dL — ABNORMAL HIGH (ref 70–99)
Potassium: 3.8 mmol/L (ref 3.5–5.1)
Sodium: 140 mmol/L (ref 135–145)

## 2019-12-23 LAB — POCT I-STAT 7, (LYTES, BLD GAS, ICA,H+H)
Acid-Base Excess: 1 mmol/L (ref 0.0–2.0)
Acid-Base Excess: 0 mmol/L (ref 0.0–2.0)
Acid-Base Excess: 1 mmol/L (ref 0.0–2.0)
Bicarbonate: 26.7 mmol/L (ref 20.0–28.0)
Bicarbonate: 24.9 mmol/L (ref 20.0–28.0)
Bicarbonate: 26 mmol/L (ref 20.0–28.0)
Calcium, Ion: 1.26 mmol/L (ref 1.15–1.40)
Calcium, Ion: 1.24 mmol/L (ref 1.15–1.40)
Calcium, Ion: 1.26 mmol/L (ref 1.15–1.40)
HCT: 34 % — ABNORMAL LOW (ref 39.0–52.0)
HCT: 33 % — ABNORMAL LOW (ref 39.0–52.0)
HCT: 34 % — ABNORMAL LOW (ref 39.0–52.0)
Hemoglobin: 11.6 g/dL — ABNORMAL LOW (ref 13.0–17.0)
Hemoglobin: 11.2 g/dL — ABNORMAL LOW (ref 13.0–17.0)
Hemoglobin: 11.6 g/dL — ABNORMAL LOW (ref 13.0–17.0)
O2 Saturation: 75 %
O2 Saturation: 100 %
O2 Saturation: 75 %
Potassium: 4 mmol/L (ref 3.5–5.1)
Potassium: 3.9 mmol/L (ref 3.5–5.1)
Potassium: 4 mmol/L (ref 3.5–5.1)
Sodium: 142 mmol/L (ref 135–145)
Sodium: 141 mmol/L (ref 135–145)
Sodium: 142 mmol/L (ref 135–145)
TCO2: 28 mmol/L (ref 22–32)
TCO2: 26 mmol/L (ref 22–32)
TCO2: 27 mmol/L (ref 22–32)
pCO2 arterial: 44.9 mmHg (ref 32.0–48.0)
pCO2 arterial: 38.3 mmHg (ref 32.0–48.0)
pCO2 arterial: 44 mmHg (ref 32.0–48.0)
pH, Arterial: 7.382 (ref 7.350–7.450)
pH, Arterial: 7.379 (ref 7.350–7.450)
pH, Arterial: 7.421 (ref 7.350–7.450)
pO2, Arterial: 41 mmHg — ABNORMAL LOW (ref 83.0–108.0)
pO2, Arterial: 251 mmHg — ABNORMAL HIGH (ref 83.0–108.0)
pO2, Arterial: 41 mmHg — ABNORMAL LOW (ref 83.0–108.0)

## 2019-12-23 LAB — MAGNESIUM: Magnesium: 2 mg/dL (ref 1.7–2.4)

## 2019-12-23 SURGERY — RIGHT/LEFT HEART CATH AND CORONARY ANGIOGRAPHY
Anesthesia: LOCAL

## 2019-12-23 MED ORDER — VERAPAMIL HCL 2.5 MG/ML IV SOLN
INTRAVENOUS | Status: AC
  Filled 2019-12-23: qty 2

## 2019-12-23 MED ORDER — NITROGLYCERIN 1 MG/10 ML FOR IR/CATH LAB
INTRA_ARTERIAL | Status: AC
  Filled 2019-12-23: qty 10

## 2019-12-23 MED ORDER — HEPARIN (PORCINE) IN NACL 1000-0.9 UT/500ML-% IV SOLN
INTRAVENOUS | Status: DC | PRN
  Administered 2019-12-23 (×2): 500 mL

## 2019-12-23 MED ORDER — SODIUM CHLORIDE 0.9% FLUSH: 3.0000 mL | INTRAVENOUS | Status: DC | PRN

## 2019-12-23 MED ORDER — LIDOCAINE HCL (PF) 1 % IJ SOLN
INTRAMUSCULAR | Status: DC | PRN
  Administered 2019-12-23 (×2): 2 mL

## 2019-12-23 MED ORDER — LIDOCAINE HCL (PF) 1 % IJ SOLN
INTRAMUSCULAR | Status: AC
  Filled 2019-12-23: qty 30

## 2019-12-23 MED ORDER — ASPIRIN 81 MG PO CHEW
81.0000 mg | CHEWABLE_TABLET | Freq: Every day | ORAL | Status: DC
  Administered 2019-12-24: 81 mg via ORAL
  Filled 2019-12-23: qty 1

## 2019-12-23 MED ORDER — VERAPAMIL HCL 2.5 MG/ML IV SOLN: INTRA_ARTERIAL | Status: DC | PRN

## 2019-12-23 MED ORDER — ONDANSETRON HCL 4 MG/2ML IJ SOLN: 4.0000 mg | Freq: Four times a day (QID) | INTRAMUSCULAR | Status: DC | PRN

## 2019-12-23 MED ORDER — FENTANYL CITRATE (PF) 100 MCG/2ML IJ SOLN
INTRAMUSCULAR | Status: AC
  Filled 2019-12-23: qty 2

## 2019-12-23 MED ORDER — SODIUM CHLORIDE 0.9 % IV SOLN: INTRAVENOUS | Status: DC

## 2019-12-23 MED ORDER — ACETAMINOPHEN 325 MG PO TABS: 650.0000 mg | ORAL_TABLET | ORAL | Status: DC | PRN

## 2019-12-23 MED ORDER — FENTANYL CITRATE (PF) 100 MCG/2ML IJ SOLN
INTRAMUSCULAR | Status: DC | PRN
  Administered 2019-12-23: 25 ug via INTRAVENOUS

## 2019-12-23 MED ORDER — HYDRALAZINE HCL 25 MG PO TABS
25.0000 mg | ORAL_TABLET | Freq: Three times a day (TID) | ORAL | Status: DC
  Administered 2019-12-23 – 2019-12-24 (×3): 25 mg via ORAL
  Filled 2019-12-23 (×3): qty 1

## 2019-12-23 MED ORDER — HYDRALAZINE HCL 20 MG/ML IJ SOLN: 10.0000 mg | INTRAMUSCULAR | Status: AC | PRN

## 2019-12-23 MED ORDER — ISOSORBIDE MONONITRATE ER 30 MG PO TB24
30.0000 mg | ORAL_TABLET | Freq: Every day | ORAL | Status: DC
  Administered 2019-12-23: 30 mg via ORAL
  Filled 2019-12-23: qty 1

## 2019-12-23 MED ORDER — HEPARIN (PORCINE) IN NACL 1000-0.9 UT/500ML-% IV SOLN
INTRAVENOUS | Status: AC
  Filled 2019-12-23: qty 500

## 2019-12-23 MED ORDER — MIDAZOLAM HCL 2 MG/2ML IJ SOLN
INTRAMUSCULAR | Status: DC | PRN
  Administered 2019-12-23: 1 mg via INTRAVENOUS

## 2019-12-23 MED ORDER — MORPHINE SULFATE (PF) 2 MG/ML IV SOLN: 2.0000 mg | INTRAVENOUS | Status: DC | PRN

## 2019-12-23 MED ORDER — ASPIRIN 81 MG PO CHEW
81.0000 mg | CHEWABLE_TABLET | Freq: Once | ORAL | Status: AC
  Administered 2019-12-23: 81 mg via ORAL
  Filled 2019-12-23: qty 1

## 2019-12-23 MED ORDER — HEPARIN SODIUM (PORCINE) 1000 UNIT/ML IJ SOLN
INTRAMUSCULAR | Status: DC | PRN
  Administered 2019-12-23: 6000 [IU] via INTRAVENOUS

## 2019-12-23 MED ORDER — IOHEXOL 350 MG/ML SOLN
INTRAVENOUS | Status: DC | PRN
  Administered 2019-12-23: 55 mL

## 2019-12-23 MED ORDER — SODIUM CHLORIDE 0.9 % IV SOLN: 250.0000 mL | INTRAVENOUS | Status: DC | PRN

## 2019-12-23 MED ORDER — SODIUM CHLORIDE 0.9 % IV SOLN: INTRAVENOUS | Status: AC

## 2019-12-23 MED ORDER — MIDAZOLAM HCL 2 MG/2ML IJ SOLN
INTRAMUSCULAR | Status: AC
  Filled 2019-12-23: qty 2

## 2019-12-23 MED ORDER — HEPARIN SODIUM (PORCINE) 1000 UNIT/ML IJ SOLN
INTRAMUSCULAR | Status: AC
  Filled 2019-12-23: qty 1

## 2019-12-23 MED ORDER — SODIUM CHLORIDE 0.9% FLUSH
3.0000 mL | Freq: Two times a day (BID) | INTRAVENOUS | Status: DC
  Administered 2019-12-23: 3 mL via INTRAVENOUS

## 2019-12-23 MED ORDER — SODIUM CHLORIDE 0.9% FLUSH: 3.0000 mL | Freq: Two times a day (BID) | INTRAVENOUS | Status: DC

## 2019-12-23 MED ORDER — SODIUM CHLORIDE 0.9 % IV SOLN: Freq: Once | INTRAVENOUS | Status: DC

## 2019-12-23 MED ORDER — LABETALOL HCL 5 MG/ML IV SOLN: 10.0000 mg | INTRAVENOUS | Status: AC | PRN

## 2019-12-23 SURGICAL SUPPLY — 14 items
CATH BALLN WEDGE 5F 110CM (CATHETERS) ×1 IMPLANT
CATH INFINITI JR4 5F (CATHETERS) ×1 IMPLANT
CATH OPTITORQUE TIG 4.0 5F (CATHETERS) ×1 IMPLANT
DEVICE RAD COMP TR BAND LRG (VASCULAR PRODUCTS) ×1 IMPLANT
GLIDESHEATH SLEND A-KIT 6F 22G (SHEATH) ×2 IMPLANT
GUIDEWIRE .025 260CM (WIRE) ×1 IMPLANT
GUIDEWIRE INQWIRE 1.5J.035X260 (WIRE) IMPLANT
INQWIRE 1.5J .035X260CM (WIRE) ×2
KIT HEART LEFT (KITS) ×2 IMPLANT
PACK CARDIAC CATHETERIZATION (CUSTOM PROCEDURE TRAY) ×2 IMPLANT
SHEATH GLIDE SLENDER 4/5FR (SHEATH) ×1 IMPLANT
TRANSDUCER W/STOPCOCK (MISCELLANEOUS) ×2 IMPLANT
TUBING CIL FLEX 10 FLL-RA (TUBING) ×2 IMPLANT
WIRE HI TORQ VERSACORE-J 145CM (WIRE) ×1 IMPLANT

## 2019-12-23 NOTE — Interval H&P Note (Signed)
Cath Lab Visit (complete for each Cath Lab visit)  Clinical Evaluation Leading to the Procedure:   ACS: No.  Non-ACS:    Anginal Classification: CCS I  Anti-ischemic medical therapy: No Therapy  Non-Invasive Test Results: No non-invasive testing performed  Prior CABG: No previous CABG      History and Physical Interval Note:  12/23/2019 9:07 AM  Ronald Young  has presented today for surgery, with the diagnosis of NSTEMI.  The various methods of treatment have been discussed with the patient and family. After consideration of risks, benefits and other options for treatment, the patient has consented to  Procedure(s): RIGHT/LEFT HEART CATH AND CORONARY ANGIOGRAPHY (N/A) as a surgical intervention.  [name] history has been reviewed, patient examined, no change in status, stable for surgery.  I have reviewed the patient's chart and labs.  Questions were answered to the patient's satisfaction.     Nanetta Batty

## 2019-12-23 NOTE — Progress Notes (Signed)
PROGRESS NOTE    Ronald Young  ZOX:[id number] DOB: 10/29/1970 DOA: 12/20/2019 PCP: Patient, No Pcp Per   Brief Narrative:  Ronald Young is a 49 y.o. male with history of combined CHF, HTN not on medications and cocaine use presenting with progressive shortness of breath, orthopnea and dry cough for about 2 weeks. Patient was diagnosed with combined CHF with EF of 25 to 30% and G2-DD in 2016.  At that time, he was discharged on diuretics and GDMT but never followed up.  He now presents with progressive shortness of breath, orthopnea and DOE over the last 2 weeks after his second dose of COVID-19 vaccination.  He denies PND, edema, chest pain or palpitation.  He denies family history of heart disease. Of note, patient presented to ED yesterday but seems to have eloped.   He is admitted for acute on chronic combined systolic and diastolic CHF.  Cardiology consulted recommended repeat echocardiogram which shows EF less than 20%.  Patient might need ischemic work-up.  Plan: Left and right heart catheterization today.   Assessment & Plan:   Active Problems:   Hypertensive emergency   Acute on chronic combined systolic and diastolic CHF (congestive heart failure) (HCC)   Acute on chronic combined systolic and diastolic CHF (congestive heart failure) (HCC) Echo in 2016 with EF of 25 to 30% and G2-DD.  Patient was lost to follow-up since 2016.  Has not been on any medications.  BNP elevated to 1000.  Mildly elevated troponin likely due to demand ischemia.  Chest x-ray consistent with CHF. -CHF pathway-IV Lasix 40 mg twice daily -Carvedilol 12.5 mg twice daily -Started losartan 25 mg daily -Cardiology consulted -Repeat ECHO, LVEF less than 20% -Monitor fluid status and renal functions -Counseled against NSAID use. -TOC consult for PCP and medication assistance. - Plan:  Left and right heart catheterization today.  Uncontrolled hypertension/hypertensive urgency -cardiac meds as  above.  Elevated troponin: Denies chest pain.  Pattern consistent with demand ischemia.  No evidence of acute ischemia on EKG. -Manage CHF and hypertension as above.  Hyperglycemia without diagnosis of diabetes -Check hemoglobin A1c.  Class II obesity -Encourage lifestyle change to lose weight.  History of cocaine use: Denies any recreational drug use recently. - UDS - Negative.  Care coordination-patient has no PCP or insurance.  Lost to follow-up when he was diagnosed with CHF in 2016. -TOC consulted   DVT prophylaxis: Lovenox Code Status: Full code Family Communication: d/w family at bedside. Disposition Plan:  Anticipated DC home in 1-2 days Consultants:   Cardiology   Procedures: Echocardiogram.  Antimicrobials: Anti-infectives (From admission, onward)   None      Subjective: Patient was seen and examined at bedside.  No overnight events.  Patient reports feeling much better.  He had echocardiogram , aware that he is going to have LHC/RHC today.  He denies any chest pain, shortness of breath, dizziness.   Objective: Vitals:   12/23/19 1110 12/23/19 1115 12/23/19 1145 12/23/19 1210  BP: (!) 162/102 (!) 155/92 (!) 153/96   Pulse: 71 71 73 75  Resp:  (!) 22 (!) 22 18  Temp:      TempSrc:      SpO2: 98% 96% 97% 97%  Weight:      Height:        Intake/Output Summary (Last 24 hours) at 12/23/2019 1236 Last data filed at 12/23/2019 0600 Gross per 24 hour  Intake 243 ml  Output 500 ml  Net -257 ml   Filed  Weights   12/22/19 0500 12/23/19 0500 12/23/19 0613  Weight: 119.2 kg 120 kg 120.1 kg    Examination:  General exam: Appears calm and comfortable  Respiratory system: Clear to auscultation. Respiratory effort normal. Cardiovascular system: S1 & S2 heard, RRR. No JVD, murmurs, rubs, gallops or clicks. No pedal edema. Gastrointestinal system: Abdomen is nondistended, soft and nontender. No organomegaly or masses felt. Normal bowel sounds  heard. Central nervous system: Alert and oriented. No focal neurological deficits. Extremities: No leg swellings, no cyanosis,. Skin: No rashes, lesions or ulcers Psychiatry: Judgement and insight appear normal. Mood & affect appropriate.     Data Reviewed: I have personally reviewed following labs and imaging studies  CBC: Recent Labs  Lab 12/18/19 1722 12/20/19 1003 12/22/19 0530 12/23/19 0551 12/23/19 0942  WBC 6.6 8.7 8.7 8.5  --   NEUTROABS  --  5.7  --   --   --   HGB 13.7 14.0 11.8* 11.2* 11.6*  HCT 42.8 43.5 35.6* 34.4* 34.0*  MCV 85.4 86.5 85.6 86.9  --   PLT 381 367 324 315  --    Basic Metabolic Panel: Recent Labs  Lab 12/18/19 1722 12/18/19 1722 12/20/19 1003 12/21/19 0443 12/22/19 0530 12/23/19 0551 12/23/19 0942  NA 140   < > 139 140 136 140 142  K 4.2   < > 3.6 3.7 3.4* 3.8 4.0  CL 107  --  106 105 104 106  --   CO2 22  --  23 26 23 23   --   GLUCOSE 142*  --  120* 114* 95 109*  --   BUN 14  --  16 21* 21* 22*  --   CREATININE 1.35*  --  1.35* 1.51* 1.36* 1.31*  --   CALCIUM 9.3  --  9.1 9.0 8.6* 9.2  --   MG  --   --   --  2.3 2.0 2.0  --   PHOS  --   --   --   --  3.8  --   --    < > = values in this interval not displayed.   GFR: Estimated Creatinine Clearance: 92.3 mL/min (A) (by C-G formula based on SCr of 1.31 mg/dL (H)). Liver Function Tests: Recent Labs  Lab 12/20/19 1003  AST 31  ALT 43  ALKPHOS 78  BILITOT 0.9  PROT 7.5  ALBUMIN 3.7   No results for input(s): LIPASE, AMYLASE in the last 168 hours. No results for input(s): AMMONIA in the last 168 hours. Coagulation Profile: No results for input(s): INR, PROTIME in the last 168 hours. Cardiac Enzymes: No results for input(s): CKTOTAL, CKMB, CKMBINDEX, TROPONINI in the last 168 hours. BNP (last 3 results) No results for input(s): PROBNP in the last 8760 hours. HbA1C: No results for input(s): HGBA1C in the last 72 hours. CBG: No results for input(s): GLUCAP in the last 168  hours. Lipid Profile: Recent Labs    12/21/19 0443  CHOL 183  HDL 28*  LDLCALC 142*  TRIG 67  CHOLHDL 6.5   Thyroid Function Tests: No results for input(s): TSH, T4TOTAL, FREET4, T3FREE, THYROIDAB in the last 72 hours. Anemia Panel: No results for input(s): VITAMINB12, FOLATE, FERRITIN, TIBC, IRON, RETICCTPCT in the last 72 hours. Sepsis Labs: No results for input(s): PROCALCITON, LATICACIDVEN in the last 168 hours.  Recent Results (from the past 240 hour(s))  SARS Coronavirus 2 by RT PCR (hospital order, performed in [facility] hospital lab) Nasopharyngeal Nasopharyngeal Swab  Status: None   Collection Time: 12/18/19  5:16 PM   Specimen: Nasopharyngeal Swab  Result Value Ref Range Status   SARS Coronavirus 2 NEGATIVE NEGATIVE Final    Comment: (NOTE) SARS-CoV-2 target nucleic acids are NOT DETECTED.  The SARS-CoV-2 RNA is generally detectable in upper and lower respiratory specimens during the acute phase of infection. The lowest concentration of SARS-CoV-2 viral copies this assay can detect is 250 copies / mL. A negative result does not preclude SARS-CoV-2 infection and should not be used as the sole basis for treatment or other patient management decisions.  A negative result may occur with improper specimen collection / handling, submission of specimen other than nasopharyngeal swab, presence of viral mutation(s) within the areas targeted by this assay, and inadequate number of viral copies (<250 copies / mL). A negative result must be combined with clinical observations, patient history, and epidemiological information.  Fact Sheet for Patients:   BoilerBrush.com.cy  Fact Sheet for Healthcare Providers: https://pope.com/  This test is not yet approved or  cleared by the [place] FDA and has been authorized for detection and/or diagnosis of SARS-CoV-2 by FDA under an Emergency Use Authorization (EUA).  This EUA  will remain in effect (meaning this test can be used) for the duration of the COVID-19 declaration under Section 564(b)(1) of the Act, 21 U.S.C. section 360bbb-3(b)(1), unless the authorization is terminated or revoked sooner.  Performed at [facility], [address], [place], [place] [address]   SARS Coronavirus 2 by RT PCR (hospital order, performed in [facility] hospital lab) Nasopharyngeal Nasopharyngeal Swab     Status: None   Collection Time: 12/20/19  2:21 PM   Specimen: Nasopharyngeal Swab  Result Value Ref Range Status   SARS Coronavirus 2 NEGATIVE NEGATIVE Final    Comment: (NOTE) SARS-CoV-2 target nucleic acids are NOT DETECTED.  The SARS-CoV-2 RNA is generally detectable in upper and lower respiratory specimens during the acute phase of infection. The lowest concentration of SARS-CoV-2 viral copies this assay can detect is 250 copies / mL. A negative result does not preclude SARS-CoV-2 infection and should not be used as the sole basis for treatment or other patient management decisions.  A negative result may occur with improper specimen collection / handling, submission of specimen other than nasopharyngeal swab, presence of viral mutation(s) within the areas targeted by this assay, and inadequate number of viral copies (<250 copies / mL). A negative result must be combined with clinical observations, patient history, and epidemiological information.  Fact Sheet for Patients:   BoilerBrush.com.cy  Fact Sheet for Healthcare Providers: https://pope.com/  This test is not yet approved or  cleared by the [place] FDA and has been authorized for detection and/or diagnosis of SARS-CoV-2 by FDA under an Emergency Use Authorization (EUA).  This EUA will remain in effect (meaning this test can be used) for the duration of the COVID-19 declaration under Section 564(b)(1) of the Act, 21 U.S.C. section  360bbb-3(b)(1), unless the authorization is terminated or revoked sooner.  Performed at [facility], [address] [place], [place] [address]      Radiology Studies: CARDIAC CATHETERIZATION  Result Date: 12/23/2019 Ronald Young is a 49 y.o. male  [id number] LOCATION:  FACILITY: [facility] PHYSICIAN: Nanetta Batty, M.D. 1970/09/12 DATE OF PROCEDURE:  12/23/2019 DATE OF DISCHARGE: CARDIAC CATHETERIZATION History obtained from chart review. 49 y.o. male with a hx of reported nonischemic cardiomyopathy (EF of 25% per echo in 2016 with Myoview in 2016  showing no perfusion defects),carotid artery disease (1-39% stenosis B/L),LVH, HTN, non-compliance, prior cocaine usewho was admitted with shortness of breath. His enzymes were mildly elevated but flat. His BNP was elevated. Repeat 2D echo revealed an EF of less than 20%. Apparently has not taken his medications for the last 5 years. He was referred for right left heart cath to define his anatomy and physiology.   Mr. Westrup has normal coronary arteries, elevated LVEDP and fairly normal filling pressures. He has a nonischemic cardiomyopathy probably hypertensive in nature from failure to take his prescribed medications. The antecubital venous sheath was removed and pressure held. The radial sheath was removed and a TR band was placed on the right wrist to achieve patent hemostasis. The patient left lab in stable condition. He'll be transferred back to [facility] hospital once the TR band is off and resume guideline directed optimal medical treatment. He left the lab in stable condition. Nanetta Batty. MD, [facility] 12/23/2019 10:10 AM    Scheduled Meds: . [MAR Hold] carvedilol  25 mg Oral BID WC  . [MAR Hold] enoxaparin (LOVENOX) injection  40 mg Subcutaneous Q24H  . hydrALAZINE  25 mg Oral Q8H  . isosorbide mononitrate  30 mg Oral Daily  . [MAR Hold] sodium chloride flush  3 mL Intravenous Q12H  . sodium chloride flush  3 mL Intravenous  Q12H  . [MAR Hold] spironolactone  25 mg Oral Daily   Continuous Infusions: . [MAR Hold] sodium chloride    . sodium chloride    . sodium chloride    . sodium chloride 10 mL/hr at 12/23/19 0616  . sodium chloride 75 mL/hr at 12/23/19 1017     LOS: 3 days    Time spent: 25 mins.    Cipriano Bunker, MD [facility]   If 7PM-7AM, please contact night-coverage

## 2019-12-23 NOTE — Plan of Care (Signed)
  Problem: Education: Goal: Knowledge of General Education information will improve Description: Including pain rating scale, medication(s)/side effects and non-pharmacologic comfort measures Outcome: Progressing   Problem: Health Behavior/Discharge Planning: Goal: Ability to manage health-related needs will improve Outcome: Progressing   Problem: Clinical Measurements: Goal: Ability to maintain clinical measurements within normal limits will improve Outcome: Progressing Goal: Will remain free from infection Outcome: Progressing Goal: Diagnostic test results will improve Outcome: Progressing Goal: Respiratory complications will improve Outcome: Progressing Goal: Cardiovascular complication will be avoided Outcome: Progressing   Problem: Activity: Goal: Risk for activity intolerance will decrease Outcome: Progressing   Problem: Nutrition: Goal: Adequate nutrition will be maintained Outcome: Progressing   Problem: Coping: Goal: Level of anxiety will decrease Outcome: Progressing   Problem: Elimination: Goal: Will not experience complications related to bowel motility Outcome: Progressing Goal: Will not experience complications related to urinary retention Outcome: Progressing   Problem: Pain Managment: Goal: General experience of comfort will improve Outcome: Progressing   Problem: Safety: Goal: Ability to remain free from injury will improve Outcome: Progressing   Problem: Skin Integrity: Goal: Risk for impaired skin integrity will decrease Outcome: Progressing   Problem: Education: Goal: Understanding of CV disease, CV risk reduction, and recovery process will improve Outcome: Progressing   Problem: Activity: Goal: Ability to return to baseline activity level will improve Outcome: Progressing   Problem: Cardiovascular: Goal: Ability to achieve and maintain adequate cardiovascular perfusion will improve Outcome: Progressing Goal: Vascular access site(s)  Level 0-1 will be maintained Outcome: Progressing   Problem: Health Behavior/Discharge Planning: Goal: Ability to safely manage health-related needs after discharge will improve Outcome: Progressing   

## 2019-12-23 NOTE — Progress Notes (Signed)
Progress Note  Patient Name: Ronald Young Date of Encounter: 12/23/2019  [facility] Cardiologist: No primary care provider on file.   Subjective   Cath today with normal coronaries.  Normal RHC pressures (RA 2, RV 20/0, PA 23/9/14, PCWP 8, CI 3.5).  Denies any chest pain or dyspnea   Inpatient Medications    Scheduled Meds: . [MAR Hold] carvedilol  25 mg Oral BID WC  . [MAR Hold] enoxaparin (LOVENOX) injection  40 mg Subcutaneous Q24H  . hydrALAZINE  25 mg Oral Q8H  . isosorbide mononitrate  30 mg Oral Daily  . [MAR Hold] sodium chloride flush  3 mL Intravenous Q12H  . sodium chloride flush  3 mL Intravenous Q12H  . [MAR Hold] spironolactone  25 mg Oral Daily   Continuous Infusions: . [MAR Hold] sodium chloride    . sodium chloride    . sodium chloride    . sodium chloride 10 mL/hr at 12/23/19 0616  . sodium chloride 75 mL/hr at 12/23/19 1017   PRN Meds: [MAR Hold] sodium chloride, sodium chloride, [MAR Hold] acetaminophen, hydrALAZINE, [MAR Hold] labetalol, labetalol, [MAR Hold] melatonin, morphine injection, [MAR Hold] ondansetron (ZOFRAN) IV, ondansetron (ZOFRAN) IV, [MAR Hold] sodium chloride flush, sodium chloride flush   Vital Signs    Vitals:   12/23/19 0951 12/23/19 0956 12/23/19 1000 12/23/19 1013  BP: (!) 134/98 (!) 145/107 (!) 145/105 (!) 158/105  Pulse: 73 73 79 76  Resp: (!) 28 (!) 34 (!) 37   Temp:      TempSrc:      SpO2: 99% 100% 98% 98%  Weight:      Height:        Intake/Output Summary (Last 24 hours) at 12/23/2019 1059 Last data filed at 12/23/2019 0600 Gross per 24 hour  Intake 243 ml  Output 500 ml  Net -257 ml   Last 3 Weights 12/23/2019 12/23/2019 12/22/2019  Weight (lbs) 264 lb 11.2 oz 264 lb 8.8 oz 262 lb 12.8 oz  Weight (kg) 120.067 kg 120 kg 119.205 kg      Telemetry    NSR- Personally Reviewed  ECG    No new ECG - Personally Reviewed  Physical Exam   GEN: No acute distress.   Neck: No JVD appreciated though difficult  to assess given habitus Cardiac: RRR, no murmurs, rubs, or gallops.  Respiratory: Clear to auscultation bilaterally. GI: Soft, nontender, non-distended  MS: No edema; No deformity. Neuro:  Nonfocal  Psych: Normal affect   Labs    High Sensitivity Troponin:   Recent Labs  Lab 12/20/19 1003 12/20/19 1421  TROPONINIHS 147* 125*      Chemistry Recent Labs  Lab 12/20/19 1003 12/20/19 1003 12/21/19 0443 12/22/19 0530 12/23/19 0551  NA 139   < > 140 136 140  K 3.6   < > 3.7 3.4* 3.8  CL 106   < > 105 104 106  CO2 23   < > 26 23 23   GLUCOSE 120*   < > 114* 95 109*  BUN 16   < > 21* 21* 22*  CREATININE 1.35*   < > 1.51* 1.36* 1.31*  CALCIUM 9.1   < > 9.0 8.6* 9.2  PROT 7.5  --   --   --   --   ALBUMIN 3.7  --   --   --   --   AST 31  --   --   --   --   ALT 43  --   --   --   --  ALKPHOS 78  --   --   --   --   BILITOT 0.9  --   --   --   --   GFRNONAA >60   < > 54* >60 >60  GFRAA >60   < > >60 >60 >60  ANIONGAP 10   < > 9 9 11    < > = values in this interval not displayed.     Hematology Recent Labs  Lab 12/20/19 1003 12/22/19 0530 12/23/19 0551  WBC 8.7 8.7 8.5  RBC 5.03 4.16* 3.96*  HGB 14.0 11.8* 11.2*  HCT 43.5 35.6* 34.4*  MCV 86.5 85.6 86.9  MCH 27.8 28.4 28.3  MCHC 32.2 33.1 32.6  RDW 14.9 15.0 15.1  PLT 367 324 315    BNP Recent Labs  Lab 12/20/19 1003  BNP 984.2*     DDimer No results for input(s): DDIMER in the last 168 hours.   Radiology    No results found.  Cardiac Studies   Echo 12/21/19: 1. Left ventricular ejection fraction, by estimation, is <20%. The left  ventricle has severely decreased function. The left ventricle demonstrates  global hypokinesis. The left ventricular internal cavity size was  moderately dilated. There is moderate  concentric left ventricular hypertrophy. Left ventricular diastolic  parameters are consistent with Grade III diastolic dysfunction  (restrictive).  2. Right ventricular systolic function  is severely reduced. The right  ventricular size is moderately enlarged.  3. Left atrial size was severely dilated.  4. Right atrial size was mildly dilated.  5. The mitral valve is normal in structure. Mild mitral valve  regurgitation. No evidence of mitral stenosis.  6. The aortic valve is normal in structure. Aortic valve regurgitation is  not visualized. No aortic stenosis is present.  7. There is mild dilatation of the ascending aorta measuring 40 mm.  8. The inferior vena cava is normal in size with greater than 50%  respiratory variability, suggesting right atrial pressure of 3 mmHg.   Patient Profile     49 y.o. male with a hx of reported nonischemic cardiomyopathy (EF of 25% per echo in 2016 with Myoview in 2016 showing no perfusion defects),carotid artery disease (1-39% stenosis B/L), LVH, HTN, non-compliance, prior cocaine use who is being seen today for the evaluation of CHF   Assessment & Plan    Acute on chronic combined CHF: prior EF 25-30% in 2016.   Echo this admit shows EF <20%, severe RV dysfunction.  Suspect hypertensive CM, but will need to rule out ischemia NICM, normal coronaries on cath today.  Suspect hypertensive CM - strict I/Os, monitor creatinine, daily weights - Normal filling pressures on RHC, hold lasix - continue Coreg 25mg  BID, spironolactone 25 mg daily.  Will plan to add losartan after cath if stable renal function; ideally would start entresto but expense is likely prohibitive as patient does not have insurance.  Will also not plan on discharging on BiDil due to expense, will switch to hydralazine/imdur  Elevated troponin: troponin 147>125, likely demand ischemia in setting of hypertensive urgency and decompensated heart failure.  Patient had Myoview stress test in 2016 with EF 30-45% and no abnormal perfusion identified.  Patient denies chest pain although lives a very sedentary lifestyle.  Normal coronaries on cath today  Hypertensive urgency:  history of uncontrolled HTN and not on medications for 5-6 years.  BP to 207/164 in the ED.  Started on coreg, losartan, aldactone, IV lasix - pressures improving.  continue Coreg 25mg  BID,  spironolactone 25 mg daily, will switch from Bidil to hydralazine 25 mg TID and imdur 30 mg daily  AKI: unknown baseline renal function.  Cr 1.35 >1.51 with diuresis.  Cr stable at 1.3 with holding diuresis, suspect likely baseline.  Frequent PVCs/NSVT: Maintain K>4, Mg >2.  Noncompliance: difficulty affording meds in the past.  TOC consulted  History of cocaine use: denies recent use. UDS negative   For questions or updates, please contact [facility] Please consult [email or web address] for contact info under        Signed, Little Ishikawa, MD  12/23/2019, 10:59 AM

## 2019-12-24 ENCOUNTER — Encounter (HOSPITAL_COMMUNITY): Payer: Self-pay | Admitting: Student

## 2019-12-24 DIAGNOSIS — I161 Hypertensive emergency: Secondary | ICD-10-CM

## 2019-12-24 DIAGNOSIS — I493 Ventricular premature depolarization: Secondary | ICD-10-CM

## 2019-12-24 LAB — BASIC METABOLIC PANEL
Anion gap: 10 (ref 5–15)
BUN: 15 mg/dL (ref 6–20)
CO2: 22 mmol/L (ref 22–32)
Calcium: 8.9 mg/dL (ref 8.9–10.3)
Chloride: 107 mmol/L (ref 98–111)
Creatinine, Ser: 1.18 mg/dL (ref 0.61–1.24)
GFR calc Af Amer: >60 mL/min (ref >60)
GFR calc non Af Amer: >60 mL/min (ref >60)
Glucose, Bld: 114 mg/dL — ABNORMAL HIGH (ref 70–99)
Potassium: 3.9 mmol/L (ref 3.5–5.1)
Sodium: 139 mmol/L (ref 135–145)

## 2019-12-24 LAB — CBC
HCT: 34.3 % — ABNORMAL LOW (ref 39.0–52.0)
Hemoglobin: 11.1 g/dL — ABNORMAL LOW (ref 13.0–17.0)
MCH: 28.2 pg (ref 26.0–34.0)
MCHC: 32.4 g/dL (ref 30.0–36.0)
MCV: 87.3 fL (ref 80.0–100.0)
Platelets: 326 10*3/uL (ref 150–400)
RBC: 3.93 MIL/uL — ABNORMAL LOW (ref 4.22–5.81)
RDW: 15.5 % (ref 11.5–15.5)
WBC: 7.6 10*3/uL (ref 4.0–10.5)
nRBC: 0 % (ref 0.0–0.2)

## 2019-12-24 LAB — HEMOGLOBIN A1C
Hgb A1c MFr Bld: 5.8 % — ABNORMAL HIGH (ref 4.8–5.6)
Mean Plasma Glucose: 120 mg/dL

## 2019-12-24 LAB — MAGNESIUM: Magnesium: 2.2 mg/dL (ref 1.7–2.4)

## 2019-12-24 MED ORDER — SPIRONOLACTONE 25 MG PO TABS
25.0000 mg | ORAL_TABLET | Freq: Every day | ORAL | 2 refills | Status: DC
Start: 2019-12-24 — End: 2020-01-02

## 2019-12-24 MED ORDER — LOSARTAN POTASSIUM 50 MG PO TABS
50.0000 mg | ORAL_TABLET | Freq: Every day | ORAL | Status: DC
  Administered 2019-12-24: 50 mg via ORAL
  Filled 2019-12-24: qty 1

## 2019-12-24 MED ORDER — LOSARTAN POTASSIUM 50 MG PO TABS
50.0000 mg | ORAL_TABLET | Freq: Every day | ORAL | 0 refills | Status: DC
Start: 2019-12-25 — End: 2020-01-02

## 2019-12-24 MED ORDER — CARVEDILOL 25 MG PO TABS
25.0000 mg | ORAL_TABLET | Freq: Two times a day (BID) | ORAL | 0 refills | Status: DC
Start: 2019-12-24 — End: 2020-01-02

## 2019-12-24 NOTE — Discharge Summary (Signed)
Physician Discharge Summary  Ronald Young WUJ:[id number] DOB: 07/05/1970 DOA: 12/20/2019  PCP: Patient, No Pcp Per  Admit date: 12/20/2019  Discharge date: 12/24/2019  Admitted From: Home. Disposition:  Home / Self care.  Recommendations for Outpatient Follow-up:  1. Follow up with PCP in 1-2 weeks 2. Please obtain BMP/CBC in one week 3. Some medication has been adjusted.  Coreg has been increased to 25 mg twice daily. 4. Patient is started on losartan 50 mg daily, advised to continue spironolactone 25 mg daily. 5. We will request primary care physician to check BMP in 1 week. 6. Patient was advised to follow-up with cardiology for outpatient MRI.  Home Health: No Equipment/Devices: None  Discharge Condition: Stable CODE STATUS:Full code Diet recommendation: Heart Healthy.   Brief Summary/ Hospital course : Ronald Young is a 49 years.old  AA male with history of combined CHF, HTN not on any medications and cocaine use presenting with progressive shortness of breath, orthopnea and dry cough for about 2 weeks. Patient was diagnosed with combined CHF with EF of 25 to 30% and G2-DD in 2016.  At that time, he was discharged on diuretics and GDMT but never followed up.  He now presents with progressive shortness of breath, orthopnea and DOE over the last 2 weeks after his second dose of COVID-19 vaccination.  He denies PND, edema, chest pain or palpitation.  He denies family history of heart disease. Of note, patient presented to ED yesterday but seems to have eloped.  He was admitted for acute on chronic combined systolic and diastolic CHF.  Cardiology consulted,  recommended repeat echocardiogram which shows EF less than 20%. Patient also having elevated troponin could be due to demand ischemia, Patient underwent cardiac catheterization.  Cardiac catheterization shows normal coronary arteries.  Patient cleared from cardiology to be discharged.  Patient denied any chest pain,  shortness of  breath. He wants to be discharged home.  Patient was counseled extensively about taking medications.  Patient is being discharged home on losartan 50 mg, spironolactone 25 mg, Coreg was increased to 25 mg twice daily.  Patient will follow up with cardiology for outpatient MRI.  Appointment has been made.  Patient is being discharged home.  LHC/RHC 8/23: Normal coronary arteries, elevated LVEDP and fairly normal filling pressures. He has a nonischemic cardiomyopathy probably hypertensive in nature from failure to take his prescribed medications.  He was managed for below problems.  Discharge Diagnoses:  Active Problems:   Hypertensive emergency   Acute on chronic combined systolic and diastolic CHF (congestive heart failure) (HCC)  Acute on chronic combined systolic and diastolic CHF (congestive heart failure) (HCC) Echo in 2016 with EF of 25 to 30% and G2-DD.  Patient was lost to follow-up since 2016.  Has not been on any medications.  BNP elevated to 1000.  Mildly elevated troponin likely due to demand ischemia.  Chest x-ray consistent with CHF. -CHF pathway-IV Lasix 40 mg twice daily -Carvedilol 12.5 mg twice daily -Started losartan 25 mg daily -Cardiology consulted -Repeat ECHO, LVEF less than 20% -Monitor fluid status and renal functions -Counseled against NSAID use. -TOC consult for PCP and medication assistance. LHC/ RHC: normal coronary arteries, elevated LVEDP and fairly normal filling pressures. He has a nonischemic cardiomyopathy probably hypertensive in nature from failure to take his prescribed medications.   Uncontrolled hypertension/hypertensive urgency - medications adjusted.   Elevated troponin: Denies chest pain.  Pattern consistent with demand ischemia.  No evidence of acute ischemia on EKG. -Manage CHF and  hypertension as above.   Hyperglycemia without diagnosis of diabetes -Check hemoglobin A1c.   Class II obesity -Encourage lifestyle change to lose weight.    History of cocaine use: Denies any recreational drug use recently. - UDS - Negative.   Care coordination-patient has no PCP or insurance.  Lost to follow-up when he was diagnosed with CHF in 2016. -TOC consulted    Discharge Instructions  Discharge Instructions     Call MD for:  difficulty breathing, headache or visual disturbances   Complete by: As directed    Call MD for:  persistant dizziness or light-headedness   Complete by: As directed    Call MD for:  persistant nausea and vomiting   Complete by: As directed    Diet - low sodium heart healthy   Complete by: As directed    Diet Carb Modified   Complete by: As directed    Discharge instructions   Complete by: As directed    Advised to follow-up with primary care physician in 1 week. Some medication has been adjusted.  Coreg has been increased to 25 mg twice daily. Patient is started on losartan 50 mg daily, advised to continue spironolactone 25 mg daily. We will request primary care physician to check BMP in 1 week. Patient was advised to follow-up with cardiology for outpatient MRI.   Increase activity slowly   Complete by: As directed       Allergies as of 12/24/2019       Reactions   Strawberry Extract Hives        Medication List     STOP taking these medications    furosemide 20 MG tablet Commonly known as: LASIX   lisinopril 40 MG tablet Commonly known as: ZESTRIL       TAKE these medications    acetaminophen 500 MG tablet Commonly known as: TYLENOL Take 1 tablet (500 mg total) by mouth every 6 (six) hours as needed for mild pain or headache (pain).   acetaminophen-codeine 300-30 MG tablet Commonly known as: TYLENOL #3 Take 1 tablet by mouth every 4 (four) hours as needed for moderate pain or severe pain.   carvedilol 25 MG tablet Commonly known as: COREG Take 1 tablet (25 mg total) by mouth 2 (two) times daily with a meal. What changed:   medication strength  how much to take    losartan 50 MG tablet Commonly known as: COZAAR Take 1 tablet (50 mg total) by mouth daily. Start taking on: December 25, 2019   spironolactone 25 MG tablet Commonly known as: ALDACTONE Take 1 tablet (25 mg total) by mouth daily.        Follow-up Information     [facility] Follow up.   Why: you have an appointment on 01/27/20 at 230 to reestablish primary care Contact information: [address] [place] [address] [phone]        Ronney Asters, NP Follow up.   Specialty: Cardiology Why: Hospital follow-up scheduled for 01/02/2020 at 10:45am with Edd Fabian, one of the NPs in our office. Please arrive 15 minutes early for check-in. If this date/time does not work for you, please call our office to reschedule. Contact information: [address] [place] [address] [phone]                Allergies  Allergen Reactions  . Strawberry Extract Hives    Consultations:  Cardiology    Procedures/Studies: . DG Chest  2 View  Result Date: 12/20/2019 CLINICAL DATA:  Shortness of breath after exertion. EXAM: CHEST - 2 VIEW COMPARISON:  12/18/2019 FINDINGS: Enlarged cardiopericardial silhouette, similar to prior. Mild vascular congestion. No pleural effusions. No discernible pneumothorax. No consolidation. No acute osseous abnormality. IMPRESSION: Similar cardiopericardial silhouette enlargement and mild vascular congestion. No focal consolidation. Electronically Signed   By: Feliberto Harts MD   On: 12/20/2019 08:17   DG Chest 2 View  Result Date: 12/18/2019 CLINICAL DATA:  49 year old male with shortness of breath. EXAM: CHEST - 2 VIEW COMPARISON:  Chest radiograph dated 09/26/2014. FINDINGS: There is cardiomegaly with mild vascular congestion, new since the prior radiograph. No focal consolidation, pleural effusion or pneumothorax. No acute osseous pathology. IMPRESSION: Cardiomegaly with mild  vascular congestion. No focal consolidation. Electronically Signed   By: Elgie Collard M.D.   On: 12/18/2019 18:25   CARDIAC CATHETERIZATION  Result Date: 12/23/2019 Ronald Young is a 49 y.o. male  [id number] LOCATION:  FACILITY: [facility] PHYSICIAN: Nanetta Batty, M.D. Jul 14, 1970 DATE OF PROCEDURE:  12/23/2019 DATE OF DISCHARGE: CARDIAC CATHETERIZATION History obtained from chart review. 49 y.o. male with a hx of reported nonischemic cardiomyopathy (EF of 25% per echo in 2016 with Myoview in 2016 showing no perfusion defects),carotid artery disease (1-39% stenosis B/L), LVH, HTN, non-compliance, prior cocaine use who was admitted with shortness of breath. His enzymes were mildly elevated but flat. His BNP was elevated. Repeat 2D echo revealed an EF of less than 20%. Apparently has not taken his medications for the last 5 years. He was referred for right left heart cath to define his anatomy and physiology.   Mr. Oyster has normal coronary arteries, elevated LVEDP and fairly normal filling pressures. He has a nonischemic cardiomyopathy probably hypertensive in nature from failure to take his prescribed medications. The antecubital venous sheath was removed and pressure held. The radial sheath was removed and a TR band was placed on the right wrist to achieve patent hemostasis. The patient left lab in stable condition. He'll be transferred back to [facility] once the TR band is off and resume guideline directed optimal medical treatment. He left the lab in stable condition. Nanetta Batty. MD, [facility] 12/23/2019 10:10 AM   ECHOCARDIOGRAM COMPLETE  Result Date: 12/21/2019    ECHOCARDIOGRAM REPORT   Patient Name:   Ronald Young Date of Exam: 12/21/2019 Medical Rec #:  [id number]     Height:       72.0 in Accession #:    [id number]    Weight:       265.0 lb Date of Birth:  May 31, 1970     BSA:          2.401 m Patient Age:    49 years      BP:           172/95 mmHg Patient Gender: M             HR:            67 bpm. Exam Location:  Inpatient Procedure: 2D Echo, Cardiac Doppler, Color Doppler and Intracardiac            Opacification Agent Indications:    I50.23 Acute on chronic systolic (congestive) heart failure  History:        Patient has prior history of Echocardiogram examinations, most                 recent 09/22/2014. Cardiomegaly; Risk Factors:Hypertension.  Sonographer:    Elmarie Shiley Dance Referring Phys:  [id number] TAYE T GONFA IMPRESSIONS  1. Left ventricular ejection fraction, by estimation, is <20%. The left ventricle has severely decreased function. The left ventricle demonstrates global hypokinesis. The left ventricular internal cavity size was moderately dilated. There is moderate concentric left ventricular hypertrophy. Left ventricular diastolic parameters are consistent with Grade III diastolic dysfunction (restrictive).  2. Right ventricular systolic function is severely reduced. The right ventricular size is moderately enlarged.  3. Left atrial size was severely dilated.  4. Right atrial size was mildly dilated.  5. The mitral valve is normal in structure. Mild mitral valve regurgitation. No evidence of mitral stenosis.  6. The aortic valve is normal in structure. Aortic valve regurgitation is not visualized. No aortic stenosis is present.  7. There is mild dilatation of the ascending aorta measuring 40 mm.  8. The inferior vena cava is normal in size with greater than 50% respiratory variability, suggesting right atrial pressure of 3 mmHg. FINDINGS  Left Ventricle: Left ventricular ejection fraction, by estimation, is <20%. The left ventricle has severely decreased function. The left ventricle demonstrates global hypokinesis. Definity contrast agent was given IV to delineate the left ventricular endocardial borders. The left ventricular internal cavity size was moderately dilated. There is moderate concentric left ventricular hypertrophy. Left ventricular diastolic parameters are consistent with Grade  III diastolic dysfunction (restrictive). Normal left ventricular filling pressure. Right Ventricle: The right ventricular size is moderately enlarged. No increase in right ventricular wall thickness. Right ventricular systolic function is severely reduced. Left Atrium: Left atrial size was severely dilated. Right Atrium: Right atrial size was mildly dilated. Pericardium: There is no evidence of pericardial effusion. Mitral Valve: The mitral valve is normal in structure. Normal mobility of the mitral valve leaflets. Mild mitral valve regurgitation. No evidence of mitral valve stenosis. Tricuspid Valve: The tricuspid valve is normal in structure. Tricuspid valve regurgitation is mild . No evidence of tricuspid stenosis. Aortic Valve: The aortic valve is normal in structure. Aortic valve regurgitation is not visualized. No aortic stenosis is present. Pulmonic Valve: The pulmonic valve was normal in structure. Pulmonic valve regurgitation is mild. No evidence of pulmonic stenosis. Aorta: The aortic root is normal in size and structure. There is mild dilatation of the ascending aorta measuring 40 mm. Venous: The inferior vena cava is normal in size with greater than 50% respiratory variability, suggesting right atrial pressure of 3 mmHg. IAS/Shunts: No atrial level shunt detected by color flow Doppler.  LEFT VENTRICLE PLAX 2D LVIDd:         6.00 cm LVIDs:         5.30 cm LV PW:         1.80 cm LV IVS:        1.40 cm LVOT diam:     2.20 cm LV SV:         39 LV SV Index:   16 LVOT Area:     3.80 cm  RIGHT VENTRICLE          IVC RV Basal diam:  4.10 cm  IVC diam: 2.00 cm RV Mid diam:    2.60 cm TAPSE (M-mode): 2.2 cm LEFT ATRIUM            Index       RIGHT ATRIUM           Index LA diam:      5.60 cm  2.33 cm/m  RA Area:     25.50 cm LA Vol (A2C): 133.0 ml 55.39 ml/m RA  Volume:   80.70 ml  33.61 ml/m LA Vol (A4C): 59.0 ml  24.57 ml/m  AORTIC VALVE LVOT Vmax:   70.90 cm/s LVOT Vmean:  44.900 cm/s LVOT VTI:    0.102 m   AORTA Ao Root diam: 3.20 cm Ao Asc diam:  4.00 cm MITRAL VALVE MV Area (PHT): 6.74 cm    SHUNTS MV Decel Time: 113 msec    Systemic VTI:  0.10 m MV E velocity: 99.05 cm/s  Systemic Diam: 2.20 cm Tobias Alexander MD Electronically signed by Tobias Alexander MD Signature Date/Time: 12/21/2019/12:14:25 PM    Final     (Echo,  LHC/ RHC)    Subjective: Patient was seen and examined at bedside.  No overnight events.  Patient denies any chest pain , he is cleared from cardiology to be discharged.  He is excited to go home.  Patient is being discharged home.  Discharge Exam: Vitals:   12/24/19 0629 12/24/19 1244  BP: (!) 147/97 127/87  Pulse: 75 68  Resp: 20 18  Temp: 98.7 F (37.1 C) 97.9 F (36.6 C)  SpO2: 97% 98%   Vitals:   12/23/19 2056 12/24/19 0500 12/24/19 0629 12/24/19 1244  BP: 136/86  (!) 147/97 127/87  Pulse: 73  75 68  Resp: 20  20 18   Temp: 98.7 F (37.1 C)  98.7 F (37.1 C) 97.9 F (36.6 C)  TempSrc: Oral  Oral Oral  SpO2: 98%  97% 98%  Weight:  120.5 kg    Height:        General: Pt is alert, awake, not in acute distress Cardiovascular: RRR, S1/S2 +, no rubs, no gallops Respiratory: CTA bilaterally, no wheezing, no rhonchi Abdominal: Soft, NT, ND, bowel sounds + Extremities: no edema, no cyanosis    The results of significant diagnostics from this hospitalization (including imaging, microbiology, ancillary and laboratory) are listed below for reference.     Microbiology: Recent Results (from the past 240 hour(s))  SARS Coronavirus 2 by RT PCR (hospital order, performed in [facility] hospital lab) Nasopharyngeal Nasopharyngeal Swab     Status: None   Collection Time: 12/18/19  5:16 PM   Specimen: Nasopharyngeal Swab  Result Value Ref Range Status   SARS Coronavirus 2 NEGATIVE NEGATIVE Final    Comment: (NOTE) SARS-CoV-2 target nucleic acids are NOT DETECTED.  The SARS-CoV-2 RNA is generally detectable in upper and lower respiratory specimens during the  acute phase of infection. The lowest concentration of SARS-CoV-2 viral copies this assay can detect is 250 copies / mL. A negative result does not preclude SARS-CoV-2 infection and should not be used as the sole basis for treatment or other patient management decisions.  A negative result may occur with improper specimen collection / handling, submission of specimen other than nasopharyngeal swab, presence of viral mutation(s) within the areas targeted by this assay, and inadequate number of viral copies (<250 copies / mL). A negative result must be combined with clinical observations, patient history, and epidemiological information.  Fact Sheet for Patients:   BoilerBrush.com.cy  Fact Sheet for Healthcare Providers: https://pope.com/  This test is not yet approved or  cleared by the [place] FDA and has been authorized for detection and/or diagnosis of SARS-CoV-2 by FDA under an Emergency Use Authorization (EUA).  This EUA will remain in effect (meaning this test can be used) for the duration of the COVID-19 declaration under Section 564(b)(1) of the Act, 21 U.S.C. section 360bbb-3(b)(1), unless the authorization is terminated or revoked sooner.  Performed  at [facility], [address], [place], [place] [address]   SARS Coronavirus 2 by RT PCR (hospital order, performed in [facility] hospital lab) Nasopharyngeal Nasopharyngeal Swab     Status: None   Collection Time: 12/20/19  2:21 PM   Specimen: Nasopharyngeal Swab  Result Value Ref Range Status   SARS Coronavirus 2 NEGATIVE NEGATIVE Final    Comment: (NOTE) SARS-CoV-2 target nucleic acids are NOT DETECTED.  The SARS-CoV-2 RNA is generally detectable in upper and lower respiratory specimens during the acute phase of infection. The lowest concentration of SARS-CoV-2 viral copies this assay can detect is 250 copies / mL. A negative result does not preclude SARS-CoV-2  infection and should not be used as the sole basis for treatment or other patient management decisions.  A negative result may occur with improper specimen collection / handling, submission of specimen other than nasopharyngeal swab, presence of viral mutation(s) within the areas targeted by this assay, and inadequate number of viral copies (<250 copies / mL). A negative result must be combined with clinical observations, patient history, and epidemiological information.  Fact Sheet for Patients:   BoilerBrush.com.cy  Fact Sheet for Healthcare Providers: https://pope.com/  This test is not yet approved or  cleared by the [place] FDA and has been authorized for detection and/or diagnosis of SARS-CoV-2 by FDA under an Emergency Use Authorization (EUA).  This EUA will remain in effect (meaning this test can be used) for the duration of the COVID-19 declaration under Section 564(b)(1) of the Act, 21 U.S.C. section 360bbb-3(b)(1), unless the authorization is terminated or revoked sooner.  Performed at [facility], [address] [place], [place] [address]      Labs: BNP (last 3 results) Recent Labs    12/20/19 1003  BNP 984.2*   Basic Metabolic Panel: Recent Labs  Lab 12/20/19 1003 12/20/19 1003 12/21/19 0443 12/21/19 0443 12/22/19 0530 12/23/19 0551 12/23/19 0942 12/23/19 0947 12/24/19 0542  NA 139   < > 140   < > 136 140 142  142 141 139  K 3.6   < > 3.7   < > 3.4* 3.8 4.0  4.0 3.9 3.9  CL 106  --  105  --  104 106  --   --  107  CO2 23  --  26  --  23 23  --   --  22  GLUCOSE 120*  --  114*  --  95 109*  --   --  114*  BUN 16  --  21*  --  21* 22*  --   --  15  CREATININE 1.35*  --  1.51*  --  1.36* 1.31*  --   --  1.18  CALCIUM 9.1  --  9.0  --  8.6* 9.2  --   --  8.9  MG  --   --  2.3  --  2.0 2.0  --   --  2.2  PHOS  --   --   --   --  3.8  --   --   --   --    < > = values in this  interval not displayed.   Liver Function Tests: Recent Labs  Lab 12/20/19 1003  AST 31  ALT 43  ALKPHOS 78  BILITOT 0.9  PROT 7.5  ALBUMIN 3.7   No results for input(s): LIPASE, AMYLASE in the last 168 hours. No results for input(s): AMMONIA in the last 168  hours. CBC: Recent Labs  Lab 12/18/19 1722 12/18/19 1722 12/20/19 1003 12/20/19 1003 12/22/19 0530 12/23/19 0551 12/23/19 0942 12/23/19 0947 12/24/19 0542  WBC 6.6  --  8.7  --  8.7 8.5  --   --  7.6  NEUTROABS  --   --  5.7  --   --   --   --   --   --   HGB 13.7   < > 14.0   < > 11.8* 11.2* 11.6*  11.6* 11.2* 11.1*  HCT 42.8   < > 43.5   < > 35.6* 34.4* 34.0*  34.0* 33.0* 34.3*  MCV 85.4  --  86.5  --  85.6 86.9  --   --  87.3  PLT 381  --  367  --  324 315  --   --  326   < > = values in this interval not displayed.   Cardiac Enzymes: No results for input(s): CKTOTAL, CKMB, CKMBINDEX, TROPONINI in the last 168 hours. BNP: Invalid input(s): POCBNP CBG: No results for input(s): GLUCAP in the last 168 hours. D-Dimer No results for input(s): DDIMER in the last 72 hours. Hgb A1c No results for input(s): HGBA1C in the last 72 hours. Lipid Profile No results for input(s): CHOL, HDL, LDLCALC, TRIG, CHOLHDL, LDLDIRECT in the last 72 hours. Thyroid function studies No results for input(s): TSH, T4TOTAL, T3FREE, THYROIDAB in the last 72 hours.  Invalid input(s): FREET3 Anemia work up No results for input(s): VITAMINB12, FOLATE, FERRITIN, TIBC, IRON, RETICCTPCT in the last 72 hours. Urinalysis    Component Value Date/Time   COLORURINE YELLOW 09/21/2014 1637   APPEARANCEUR CLEAR 09/21/2014 1637   LABSPEC 1.029 09/21/2014 1637   PHURINE 5.0 09/21/2014 1637   GLUCOSEU NEGATIVE 09/21/2014 1637   HGBUR NEGATIVE 09/21/2014 1637   BILIRUBINUR NEGATIVE 09/21/2014 1637   KETONESUR NEGATIVE 09/21/2014 1637   PROTEINUR NEGATIVE 09/21/2014 1637   UROBILINOGEN 0.2 09/21/2014 1637   NITRITE NEGATIVE 09/21/2014 1637    LEUKOCYTESUR NEGATIVE 09/21/2014 1637   Sepsis Labs Invalid input(s): PROCALCITONIN,  WBC,  LACTICIDVEN Microbiology Recent Results (from the past 240 hour(s))  SARS Coronavirus 2 by RT PCR (hospital order, performed in [facility] hospital lab) Nasopharyngeal Nasopharyngeal Swab     Status: None   Collection Time: 12/18/19  5:16 PM   Specimen: Nasopharyngeal Swab  Result Value Ref Range Status   SARS Coronavirus 2 NEGATIVE NEGATIVE Final    Comment: (NOTE) SARS-CoV-2 target nucleic acids are NOT DETECTED.  The SARS-CoV-2 RNA is generally detectable in upper and lower respiratory specimens during the acute phase of infection. The lowest concentration of SARS-CoV-2 viral copies this assay can detect is 250 copies / mL. A negative result does not preclude SARS-CoV-2 infection and should not be used as the sole basis for treatment or other patient management decisions.  A negative result may occur with improper specimen collection / handling, submission of specimen other than nasopharyngeal swab, presence of viral mutation(s) within the areas targeted by this assay, and inadequate number of viral copies (<250 copies / mL). A negative result must be combined with clinical observations, patient history, and epidemiological information.  Fact Sheet for Patients:   BoilerBrush.com.cy  Fact Sheet for Healthcare Providers: https://pope.com/  This test is not yet approved or  cleared by the [place] FDA and has been authorized for detection and/or diagnosis of SARS-CoV-2 by FDA under an Emergency Use Authorization (EUA).  This EUA will remain in effect (meaning this test  can be used) for the duration of the COVID-19 declaration under Section 564(b)(1) of the Act, 21 U.S.C. section 360bbb-3(b)(1), unless the authorization is terminated or revoked sooner.  Performed at [facility], [address] [place], [place] [address]    SARS Coronavirus 2 by RT PCR (hospital order, performed in [facility] hospital lab) Nasopharyngeal Nasopharyngeal Swab     Status: None   Collection Time: 12/20/19  2:21 PM   Specimen: Nasopharyngeal Swab  Result Value Ref Range Status   SARS Coronavirus 2 NEGATIVE NEGATIVE Final    Comment: (NOTE) SARS-CoV-2 target nucleic acids are NOT DETECTED.  The SARS-CoV-2 RNA is generally detectable in upper and lower respiratory specimens during the acute phase of infection. The lowest concentration of SARS-CoV-2 viral copies this assay can detect is 250 copies / mL. A negative result does not preclude SARS-CoV-2 infection and should not be used as the sole basis for treatment or other patient management decisions.  A negative result may occur with improper specimen collection / handling, submission of specimen other than nasopharyngeal swab, presence of viral mutation(s) within the areas targeted by this assay, and inadequate number of viral copies (<250 copies / mL). A negative result must be combined with clinical observations, patient history, and epidemiological information.  Fact Sheet for Patients:   BoilerBrush.com.cy  Fact Sheet for Healthcare Providers: https://pope.com/  This test is not yet approved or  cleared by the [place] FDA and has been authorized for detection and/or diagnosis of SARS-CoV-2 by FDA under an Emergency Use Authorization (EUA).  This EUA will remain in effect (meaning this test can be used) for the duration of the COVID-19 declaration under Section 564(b)(1) of the Act, 21 U.S.C. section 360bbb-3(b)(1), unless the authorization is terminated or revoked sooner.  Performed at [facility], [address] [place], [place] [address]      Time coordinating discharge: Over 30 minutes  SIGNED:   Cipriano Bunker, MD  [facility] 12/24/2019, 1:47 PM Pager   If 7PM-7AM,  please contact night-coverage [email or web address]

## 2019-12-24 NOTE — Progress Notes (Signed)
Pt discharged home today per Dr. Kumar. Pt's IV site D/C'd and WDL. Pt's VSS. Pt provided with home medication list, discharge instructions and prescriptions. Verbalized understanding. Pt left floor via WC in stable condition accompanied by NT.  

## 2019-12-24 NOTE — Discharge Instructions (Addendum)
Advised to follow-up with primary care physician in 1 week. Some medication has been adjusted.  Coreg has been increased to 25 mg twice daily. Patient is started on losartan 50 mg daily, advised to continue spironolactone 25 mg daily. We will request primary care physician to check BMP in 1 week. Patient was advised to follow-up with cardiology for outpatient MRI.

## 2019-12-24 NOTE — Progress Notes (Addendum)
Progress Note  Patient Name: Ronald Young Date of Encounter: 12/24/2019  [facility] Cardiologist: New to [facility] (Dr. Bjorn Pippin)  Subjective   No acute overnight events. Patient denies any chest pain, shortness of breath, or palpitations. Laying flat in the bed. Feels back to baseline.  Inpatient Medications    Scheduled Meds: . aspirin  81 mg Oral Daily  . carvedilol  25 mg Oral BID WC  . enoxaparin (LOVENOX) injection  40 mg Subcutaneous Q24H  . losartan  50 mg Oral Daily  . sodium chloride flush  3 mL Intravenous Q12H  . sodium chloride flush  3 mL Intravenous Q12H  . spironolactone  25 mg Oral Daily   Continuous Infusions: . sodium chloride    . sodium chloride     PRN Meds: sodium chloride, sodium chloride, acetaminophen, labetalol, melatonin, morphine injection, ondansetron (ZOFRAN) IV, ondansetron (ZOFRAN) IV, sodium chloride flush, sodium chloride flush   Vital Signs    Vitals:   12/23/19 1702 12/23/19 2056 12/24/19 0500 12/24/19 0629  BP: (!) 140/101 136/86  (!) 147/97  Pulse: 73 73  75  Resp: 18 20  20   Temp: 97.9 F (36.6 C) 98.7 F (37.1 C)  98.7 F (37.1 C)  TempSrc: Oral Oral  Oral  SpO2: 99% 98%  97%  Weight:   120.5 kg   Height:        Intake/Output Summary (Last 24 hours) at 12/24/2019 1134 Last data filed at 12/24/2019 0915 Gross per 24 hour  Intake 1292.59 ml  Output --  Net 1292.59 ml   Last 3 Weights 12/24/2019 12/23/2019 12/23/2019  Weight (lbs) 265 lb 9.6 oz 264 lb 11.2 oz 264 lb 8.8 oz  Weight (kg) 120.475 kg 120.067 kg 120 kg      Telemetry    Normal sinus rhythm with rates in the 60's to 70's. Frequent PVCs. - Personally Reviewed  ECG    No new ECG tracing today. - Personally Reviewed  Physical Exam   GEN: No acute distress.   Neck: Supple. Cardiac: RRR. No murmurs, rubs, or gallops. Radial and distal pedal pulses 2+ and equal bilaterally. Right radial cath site soft with no signs of hematoma. Respiratory: Clear to  auscultation bilaterally. GI: Soft, non-tender, non-distended  MS: No lower extremity edema. No deformity. Neuro:  No focal deficits. Psych: Normal affect.  Labs    High Sensitivity Troponin:   Recent Labs  Lab 12/20/19 1003 12/20/19 1421  TROPONINIHS 147* 125*      Chemistry Recent Labs  Lab 12/20/19 1003 12/21/19 0443 12/22/19 0530 12/22/19 0530 12/23/19 0551 12/23/19 0551 12/23/19 0942 12/23/19 0947 12/24/19 0542  NA 139   < > 136   < > 140   < > 142  142 141 139  K 3.6   < > 3.4*   < > 3.8   < > 4.0  4.0 3.9 3.9  CL 106   < > 104  --  106  --   --   --  107  CO2 23   < > 23  --  23  --   --   --  22  GLUCOSE 120*   < > 95  --  109*  --   --   --  114*  BUN 16   < > 21*  --  22*  --   --   --  15  CREATININE 1.35*   < > 1.36*  --  1.31*  --   --   --  1.18  CALCIUM 9.1   < > 8.6*  --  9.2  --   --   --  8.9  PROT 7.5  --   --   --   --   --   --   --   --   ALBUMIN 3.7  --   --   --   --   --   --   --   --   AST 31  --   --   --   --   --   --   --   --   ALT 43  --   --   --   --   --   --   --   --   ALKPHOS 78  --   --   --   --   --   --   --   --   BILITOT 0.9  --   --   --   --   --   --   --   --   GFRNONAA >60   < > >60  --  >60  --   --   --  >60  GFRAA >60   < > >60  --  >60  --   --   --  >60  ANIONGAP 10   < > 9  --  11  --   --   --  10   < > = values in this interval not displayed.     Hematology Recent Labs  Lab 12/22/19 0530 12/22/19 0530 12/23/19 0551 12/23/19 0551 12/23/19 0942 12/23/19 0947 12/24/19 0542  WBC 8.7  --  8.5  --   --   --  7.6  RBC 4.16*  --  3.96*  --   --   --  3.93*  HGB 11.8*   < > 11.2*   < > 11.6*  11.6* 11.2* 11.1*  HCT 35.6*   < > 34.4*   < > 34.0*  34.0* 33.0* 34.3*  MCV 85.6  --  86.9  --   --   --  87.3  MCH 28.4  --  28.3  --   --   --  28.2  MCHC 33.1  --  32.6  --   --   --  32.4  RDW 15.0  --  15.1  --   --   --  15.5  PLT 324  --  315  --   --   --  326   < > = values in this interval not  displayed.    BNP Recent Labs  Lab 12/20/19 1003  BNP 984.2*     DDimer No results for input(s): DDIMER in the last 168 hours.   Radiology    CARDIAC CATHETERIZATION  Result Date: 12/23/2019 Ronald Young is a 49 y.o. male  [id number] LOCATION:  FACILITY: [facility] PHYSICIAN: Nanetta Batty, M.D. 09-22-1970 DATE OF PROCEDURE:  12/23/2019 DATE OF DISCHARGE: CARDIAC CATHETERIZATION History obtained from chart review. 49 y.o. male with a hx of reported nonischemic cardiomyopathy (EF of 25% per echo in 2016 with Myoview in 2016 showing no perfusion defects),carotid artery disease (1-39% stenosis B/L),LVH, HTN, non-compliance, prior cocaine usewho was admitted with shortness of breath. His enzymes were mildly elevated but flat. His BNP was elevated. Repeat 2D echo revealed an EF of less than 20%. Apparently has not taken his medications for the last 5 years. He was referred for  right left heart cath to define his anatomy and physiology.   Ronald Young has normal coronary arteries, elevated LVEDP and fairly normal filling pressures. He has a nonischemic cardiomyopathy probably hypertensive in nature from failure to take his prescribed medications. The antecubital venous sheath was removed and pressure held. The radial sheath was removed and a TR band was placed on the right wrist to achieve patent hemostasis. The patient left lab in stable condition. He'll be transferred back to [facility] long hospital once the TR band is off and resume guideline directed optimal medical treatment. He left the lab in stable condition. Nanetta Batty. MD, [facility] 12/23/2019 10:10 AM    Cardiac Studies   Echocardiogram 12/21/2019: Impressions: 1. Left ventricular ejection fraction, by estimation, is <20%. The left  ventricle has severely decreased function. The left ventricle demonstrates  global hypokinesis. The left ventricular internal cavity size was  moderately dilated. There is moderate  concentric left ventricular  hypertrophy. Left ventricular diastolic  parameters are consistent with Grade III diastolic dysfunction  (restrictive).  2. Right ventricular systolic function is severely reduced. The right  ventricular size is moderately enlarged.  3. Left atrial size was severely dilated.  4. Right atrial size was mildly dilated.  5. The mitral valve is normal in structure. Mild mitral valve  regurgitation. No evidence of mitral stenosis.  6. The aortic valve is normal in structure. Aortic valve regurgitation is  not visualized. No aortic stenosis is present.  7. There is mild dilatation of the ascending aorta measuring 40 mm.  8. The inferior vena cava is normal in size with greater than 50%  respiratory variability, suggesting right atrial pressure of 3 mmHg.  _______________  Right/Left Heart Catheterization 12/23/2019: Impressions: Ronald Young has normal coronary arteries, elevated LVEDP and fairly normal filling pressures. He has a nonischemic cardiomyopathy probably hypertensive in nature from failure to take his prescribed medications. The antecubital venous sheath was removed and pressure held. The radial sheath was removed and a TR band was placed on the right wrist to achieve patent hemostasis. The patient left lab in stable condition. He'll be transferred back to [facility] long hospital once the TR band is off and resume guideline directed optimal medical treatment. He left the lab in stable condition.  Diagnostic Dominance: Right     Patient Profile     49 y.o. male with a history of non-ischemic cardiopathy with EF of 25% in 2016, mild bilateral carotid artery disease, hypertension, non-compliance, and cocaine use who is being seen today for evaluation of CHF.  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 RV moderately enlarged with severely  reduced systolic function. - R/LHC on 1/74/9449 showed normal coronaries with elevated LVEDP and fairly normal filling pressures. Hypertensive cardiomyopathy is suspected.  - Net positive 1.043 L this admission. Weight up 4 lbs since admission. Lasix stopped yesterday due to normal filling pressures. - Patient does not appear volume overloaded on exam. - Losartan 50mg  daily started today. - Continue Coreg 25mg  twice daily. - Continue Spironolactone 25mg  daily. - Initially started on Hydralazine and Imdur but this has been discontinued. - May need PRN Lasix at discharge. - Continue to monitor daily weight, strict I/O's, and renal function.  Demand Ischemia - High-sensitivity troponin elevated at 147 >> 125.  - Normal coronaries on cath. - Demand ischemia in setting of hypertensive urgency and decompensated CHF.  Hypertensive Urgency - History of uncontrolled hypertension and not on medications for 5-6 years. BP as high as 207/164 in the ED. BP improved since adding above medications. - Continue Coreg, Losartan, Spironolactone as above.  Frequent PVCs - No NSVT noted over last 24 hours but still having frequent PVCs. - Potassium 3.9 and Magnesium 2.2 today. Keep above 4.0 and 2.0, respectively. - Continue Coreg.  AKI - Creatinine 1.35 on admission and peaked at 1.51.  - Normalized at 1.18 today. - Continue to monitor.  History of Cocaine Use - Denies recent use. UDS negative.  [facility] will sign off.   Medication Recommendations:  Losartan 50 mg, carvedilol 25 mg BID, spironolactone 25 mg daily.  No indication for lasix on discharge, advised to monitor daily weights and call if gaining more than 3 lbs in a day or 5lbs in a week. Other recommendations (labs, testing, etc):  BMET in 1 week Follow up as an outpatient:  F/u scheduled with Edd Fabian on 01/02/20   For questions or updates, please contact [facility] Please consult [email or web address] for contact info under          Signed, Corrin Parker, PA-C  12/24/2019, 11:34 AM    Patient seen and examined.  Agree with above documentation.  On exam, patient is alert and oriented, regular rate and rhythm, no murmurs, lungs CTAB, no LE edema or JVD.  OK for discharge today from cardiac standpoint.  Suspect presentation is consistent with hypertensive cardiomyopathy.  Cath yesterday showed normal coronary arteries and normal filling pressures.  We will plan for cardiac MRI as outpatient.  We will plan to discharge on losartan 50 mg daily, carvedilol 25 mg daily, and spironolactone 25 mg daily.  Scheduled follow-up for 1 week.  Little Ishikawa, MD

## 2019-12-24 NOTE — TOC Progression Note (Signed)
Transition of Care [facility]) - Progression Note    Patient Details  Name: Almus Woodham MRN: [id number] Date of Birth: 1970/07/21  [facility] CM/SW Contact  Armanda Heritage, RN Phone Number: 12/24/2019, 1:59 PM  Clinical Narrative:    CM confirmed prescribed dc medications are on the 4$ list at [facility]. No need for MATCH at this time.    Expected Discharge Plan: Home/Self Care Barriers to Discharge: Continued Medical Work up  Expected Discharge Plan and Services Expected Discharge Plan: Home/Self Care In-house Referral: Clinical Social Work Discharge Planning Services: CM Consult, MATCH Program, Follow-up appt scheduled, Medication Assistance   Living arrangements for the past 2 months: Single Family Home Expected Discharge Date: 12/24/19                                     Social Determinants of Health (SDOH) Interventions    Readmission Risk Interventions No flowsheet data found.

## 2019-12-24 NOTE — Plan of Care (Signed)
  Problem: Education: Goal: Knowledge of General Education information will improve Description: Including pain rating scale, medication(s)/side effects and non-pharmacologic comfort measures Outcome: Completed/Met   Problem: Health Behavior/Discharge Planning: Goal: Ability to manage health-related needs will improve Outcome: Completed/Met   Problem: Clinical Measurements: Goal: Ability to maintain clinical measurements within normal limits will improve Outcome: Completed/Met Goal: Will remain free from infection Outcome: Completed/Met Goal: Diagnostic test results will improve Outcome: Completed/Met Goal: Respiratory complications will improve Outcome: Completed/Met Goal: Cardiovascular complication will be avoided Outcome: Completed/Met   Problem: Activity: Goal: Risk for activity intolerance will decrease Outcome: Completed/Met   Problem: Nutrition: Goal: Adequate nutrition will be maintained Outcome: Completed/Met   Problem: Coping: Goal: Level of anxiety will decrease Outcome: Completed/Met   Problem: Elimination: Goal: Will not experience complications related to bowel motility Outcome: Completed/Met Goal: Will not experience complications related to urinary retention Outcome: Completed/Met   Problem: Pain Managment: Goal: General experience of comfort will improve Outcome: Completed/Met   Problem: Safety: Goal: Ability to remain free from injury will improve Outcome: Completed/Met   Problem: Skin Integrity: Goal: Risk for impaired skin integrity will decrease Outcome: Completed/Met   Problem: Education: Goal: Understanding of CV disease, CV risk reduction, and recovery process will improve Outcome: Completed/Met Goal: Individualized Educational Video(s) Outcome: Completed/Met   Problem: Activity: Goal: Ability to return to baseline activity level will improve Outcome: Completed/Met   Problem: Cardiovascular: Goal: Ability to achieve and maintain  adequate cardiovascular perfusion will improve Outcome: Completed/Met Goal: Vascular access site(s) Level 0-1 will be maintained Outcome: Completed/Met   Problem: Health Behavior/Discharge Planning: Goal: Ability to safely manage health-related needs after discharge will improve Outcome: Completed/Met

## 2019-12-29 ENCOUNTER — Other Ambulatory Visit: Payer: Self-pay

## 2019-12-29 ENCOUNTER — Encounter (HOSPITAL_COMMUNITY): Payer: Self-pay | Admitting: Emergency Medicine

## 2019-12-29 ENCOUNTER — Emergency Department (HOSPITAL_COMMUNITY)
Admission: EM | Admit: 2019-12-29 | Discharge: 2019-12-29 | Disposition: A | Discharge status: Home / Self Care | Payer: Self-pay | Attending: Emergency Medicine | Admitting: Emergency Medicine

## 2019-12-29 DIAGNOSIS — M7918 Myalgia, other site: Secondary | ICD-10-CM | POA: Insufficient documentation

## 2019-12-29 DIAGNOSIS — M791 Myalgia, unspecified site: Secondary | ICD-10-CM

## 2019-12-29 DIAGNOSIS — I5042 Chronic combined systolic (congestive) and diastolic (congestive) heart failure: Secondary | ICD-10-CM | POA: Insufficient documentation

## 2019-12-29 DIAGNOSIS — Z87891 Personal history of nicotine dependence: Secondary | ICD-10-CM | POA: Insufficient documentation

## 2019-12-29 DIAGNOSIS — I10 Essential (primary) hypertension: Secondary | ICD-10-CM

## 2019-12-29 DIAGNOSIS — I110 Hypertensive heart disease with heart failure: Secondary | ICD-10-CM | POA: Insufficient documentation

## 2019-12-29 DIAGNOSIS — Z79899 Other long term (current) drug therapy: Secondary | ICD-10-CM | POA: Insufficient documentation

## 2019-12-29 NOTE — Discharge Instructions (Addendum)
Continue taking your medications as prescribed, I want you to see your primary care in the next couple of days.  In regards to your one episode of arm pain, keep an eye on this.  If you notice any new pain that is worsening or other concerning symptoms that we spoke about including chest pain, shortness of breath, severe headache please come back to the emergency department.  Please use the attached instructions.

## 2019-12-29 NOTE — ED Provider Notes (Signed)
Ronald Young Provider Note   CSN: [id number] Arrival date & time: 12/29/19  1227     History Chief Complaint  Patient presents with  . Muscle Pain  . Medication Reaction    Ronald Young is a 49 y.o. male with pertinent past medical history of CHF, hypertension that presents the emergency department today for myalgias.  Was recently discharged from the hospital 5 days ago for acute on chronic combined systolic and diastolic CHF and discharged on 3 new medications.  These include losartan, spironolactone and carvedilol.  States that he has been compliant with these, took them half an hour before he came here.  On arrival blood pressure 181/125.  Is not complaining of any chest pain, shortness of breath, headache, dizziness, numbness, tingling, weakness, dysuria, confusion.  Patient states that he had some myalgias last night while he was sleeping in his right upper extremity near his bicep.  Was tender to touch.  States that they have resolved without any intervention.  Wanted to know if this is due to the medications.  States that he has follow-up with his primary care in the next week for his recent admission.  Denies any pain in his bicep now, no numbness or tingling no radiation down his arm.  No chest pain at the time.  States that the pain is still in his biceps.  Denies any lifting or exercise.  States that the pain did not move around.  Denies any fevers, chills, Covid-like symptoms.  No myalgias elsewhere.  This was the only occurrence.  HPI     Past Medical History:  Diagnosis Date  . Arthritis   . Cardiomyopathy (HCC) 09/22/2014  . Hypertension     Patient Active Problem List   Diagnosis Date Noted  . Acute on chronic combined systolic and diastolic CHF (congestive heart failure) (HCC) 12/20/2019  . Essential hypertension 09/26/2014  . Bradycardia 09/26/2014  . Cocaine use 09/23/2014  . Cardiomyopathy due to hypertension, without heart  failure (HCC)   . Cardiomyopathy (HCC) 09/22/2014  . Hypertensive urgency 09/21/2014  . Syncope 09/21/2014  . Hypertensive emergency 09/21/2014  . ARF (acute renal failure) (HCC) 09/21/2014  . Elevated troponin     Past Surgical History:  Procedure Laterality Date  . RIGHT/LEFT HEART CATH AND CORONARY ANGIOGRAPHY N/A 12/23/2019   Procedure: RIGHT/LEFT HEART CATH AND CORONARY ANGIOGRAPHY;  Surgeon: Runell Gess, MD;  Location: [facility];  Service: Cardiovascular;  Laterality: N/A;       Family History  Problem Relation Age of Onset  . Hypertension Mother   . Colon cancer Other     Social History   Tobacco Use  . Smoking status: Former Smoker    Types: Cigarettes  . Smokeless tobacco: Never Used  Substance Use Topics  . Alcohol use: Yes    Comment: "drank a couple beers last weekend"  . Drug use: Yes    Types: Marijuana    Comment: Last use 10/20/14.    Home Medications Prior to Admission medications   Medication Sig Start Date End Date Taking? Authorizing Provider  acetaminophen (TYLENOL) 500 MG tablet Take 1 tablet (500 mg total) by mouth every 6 (six) hours as needed for mild pain or headache (pain). Patient not taking: Reported on 12/20/2019 09/23/14   Zannie Cove, MD  acetaminophen-codeine (TYLENOL #3) 300-30 MG tablet Take 1 tablet by mouth every 4 (four) hours as needed for moderate pain or severe pain.    [provider]  carvedilol (COREG) 25 MG tablet Take 1 tablet (25 mg total) by mouth 2 (two) times daily with a meal. 12/24/19   Cipriano Bunker, MD  losartan (COZAAR) 50 MG tablet Take 1 tablet (50 mg total) by mouth daily. 12/25/19   Cipriano Bunker, MD  spironolactone (ALDACTONE) 25 MG tablet Take 1 tablet (25 mg total) by mouth daily. 12/24/19   Cipriano Bunker, MD  amLODipine (NORVASC) 10 MG tablet Take 1 tablet (10 mg total) by mouth daily. Patient not taking: Reported on 08/29/2014 08/25/14 08/29/14  Felicie Morn, NP  benazepril (LOTENSIN)  10 MG tablet Take 1 tablet (10 mg total) by mouth daily. Patient not taking: Reported on 08/29/2014 08/25/14 08/29/14  Felicie Morn, NP    Allergies    Strawberry extract  Review of Systems   Review of Systems  Constitutional: Negative for chills, diaphoresis, fatigue and fever.  HENT: Negative for congestion, sore throat and trouble swallowing.   Eyes: Negative for pain and visual disturbance.  Respiratory: Negative for cough, shortness of breath and wheezing.   Cardiovascular: Negative for chest pain, palpitations and leg swelling.  Gastrointestinal: Negative for abdominal distention, abdominal pain, diarrhea, nausea and vomiting.  Genitourinary: Negative for difficulty urinating.  Musculoskeletal: Positive for myalgias. Negative for back pain, neck pain and neck stiffness.  Skin: Negative for pallor.  Neurological: Negative for dizziness, speech difficulty, weakness and headaches.  Psychiatric/Behavioral: Negative for confusion.    Physical Exam Updated Vital Signs BP (!) 154/108 Comment: PA aware  Pulse 74   Temp 98.2 F (36.8 C) (Oral)   Resp 18   SpO2 95%   Physical Exam Constitutional:      General: He is not in acute distress.    Appearance: Normal appearance. He is not ill-appearing, toxic-appearing or diaphoretic.  HENT:     Head: Normocephalic and atraumatic.     Mouth/Throat:     Mouth: Mucous membranes are moist.     Pharynx: Oropharynx is clear.  Eyes:     General: No scleral icterus.    Extraocular Movements: Extraocular movements intact.     Pupils: Pupils are equal, round, and reactive to light.  Cardiovascular:     Rate and Rhythm: Normal rate and regular rhythm.     Pulses: Normal pulses.     Heart sounds: Normal heart sounds.  Pulmonary:     Effort: Pulmonary effort is normal. No respiratory distress.     Breath sounds: Normal breath sounds. No stridor. No wheezing, rhonchi or rales.  Chest:     Chest wall: No tenderness.  Abdominal:     General:  Abdomen is flat. There is no distension.     Palpations: Abdomen is soft.     Tenderness: There is no abdominal tenderness. There is no guarding or rebound.  Musculoskeletal:        General: No swelling or tenderness. Normal range of motion.     Cervical back: Normal range of motion and neck supple. No rigidity.     Right lower leg: No edema.     Left lower leg: No edema.     Comments: Patient without any tenderness to upper or lower extremity.  Normal sensation throughout.  Normal strength to upper and lower extremity.  Radial pulse 2+ and equal.  Bicep appears without deformity, normal strength.  No discoloration, warmth, erythema in this area. Negative impingement tests.    Skin:    General: Skin is warm and dry.     Capillary Refill: Capillary refill takes  less than 2 seconds.     Coloration: Skin is not pale.  Neurological:     General: No focal deficit present.     Mental Status: He is alert and oriented to person, place, and time.     Cranial Nerves: No cranial nerve deficit.     Sensory: No sensory deficit.     Motor: No weakness.     Gait: Gait normal.  Psychiatric:        Mood and Affect: Mood normal.        Behavior: Behavior normal.     ED Results / Procedures / Treatments   Labs (all labs ordered are listed, but only abnormal results are displayed) Labs Reviewed - No data to display  EKG EKG Interpretation  Date/Time:  Sunday December 29 2019 13:10:44 EDT Ventricular Rate:  71 PR Interval:    QRS Duration: 108 QT Interval:  397 QTC Calculation: 432 R Axis:   27 Text Interpretation: Sinus rhythm Prolonged PR interval Probable left atrial enlargement Incomplete left bundle branch block Left ventricular hypertrophy rate has decreased but otherwise no significant change since last tracing of 20 December 2019 Confirmed by Margarita Grizzle [phone]) on 12/29/2019 1:18:55 PM   Radiology No results found.  Procedures Procedures (including critical care  time)  Medications Ordered in ED Medications - No data to display  ED Course  I have reviewed the triage vital signs and the nursing notes.  Pertinent labs & imaging results that were available during my care of the patient were reviewed by me and considered in my medical decision making (see chart for details).    MDM Rules/Calculators/A&P                         Seanmichael Salmons is a 48 y.o. male with pertinent past medical history of CHF, hypertension that presents the emergency department today for myalgias.  Patient had one episode of myalgias that occurred last night in right bicep which did not radiate.  I am not concerned for ACS at this time.  No other flulike symptoms, no chest pain, shortness of breath, nausea, vomiting, diaphoresis.  Could be side effect of myalgias from spironolactone, since patient did just start this.  Could also be muscle strain, however since pain has resolved I am not concerned about this.  Did discuss return precautions for this in depth with patient.  Patient to follow-up with primary care.  Patient is hypertensive and here today, states that he took his blood pressure medication only 30 minutes before arriving.   Patient denies headache, change in vision, numbness, weakness, chest pain, dyspnea, dizziness, or lightheadedness therefore doubt hypertensive emergency. Discussed return precaution signs/symptoms for hypertensive emergency as listed above with the patient. He confirmed understanding.  Blood pressure has decreased to 154/108 without intervention on my exam.  Doubt need for further emergent work up at this time. I explained the diagnosis and have given explicit precautions to return to the ER including for any other new or worsening symptoms. The patient understands and accepts the medical plan as it's been dictated and I have answered their questions. Discharge instructions concerning home care and prescriptions have been given. The patient is STABLE and  is discharged to home in good condition.  I discussed this case with my attending physician who cosigned this note including patient's presenting symptoms, physical exam, and planned diagnostics and interventions. Attending physician stated agreement with plan or made changes to plan which were  implemented.    Final Clinical Impression(s) / ED Diagnoses Final diagnoses:  Myalgia  Asymptomatic hypertension    Rx / DC Orders ED Discharge Orders    None       Farrel Gordon, PA-C 12/29/19 1346    Margarita Grizzle, MD 12/30/19 1434

## 2019-12-29 NOTE — ED Notes (Signed)
An After Visit Summary was printed and given to the patient. Discharge instructions given and no further questions at this time.  

## 2019-12-29 NOTE — ED Triage Notes (Signed)
Pt reports started on new HTN medication 4 days ago. Reports having muscle pain in arms. Denies any at this time.

## 2019-12-30 NOTE — Progress Notes (Signed)
Cardiology Clinic Note   Patient Name: Ronald Young Date of Encounter: 01/02/2020  Primary Care Provider:  Patient, No Pcp Per Primary Cardiologist:  Little Ishikawa, MD  Patient Profile    Ronald Young 49 year old male presents to the clinic today for follow-up evaluation of chest pain, shortness of breath, and palpitations.  Past Medical History    Past Medical History:  Diagnosis Date  . Arthritis   . Cardiomyopathy (HCC) 09/22/2014  . Hypertension    Past Surgical History:  Procedure Laterality Date  . RIGHT/LEFT HEART CATH AND CORONARY ANGIOGRAPHY N/A 12/23/2019   Procedure: RIGHT/LEFT HEART CATH AND CORONARY ANGIOGRAPHY;  Surgeon: Runell Gess, MD;  Location: [facility];  Service: Cardiovascular;  Laterality: N/A;    Allergies  Allergies  Allergen Reactions  . Strawberry Extract Hives    History of Present Illness    [name] has a past medical history of nonischemic cardiomyopathy with an EF of 25% in 2016, bilateral carotid artery disease mild, HTN, noncompliance, and cocaine abuse.  He underwent left heart cath/right heart cath 12/23/2019.  He was found to have normal filling pressures, nonischemic cardiomyopathy, with normal coronary arteries.  He was discharged in stable condition on 12/24/2019.  He returned to the emergency department on 12/29/2019 with myalgias.  On arrival his blood pressure was 181/125.  He denied chest pain, shortness of breath, headache, dizziness, numbness, dysuria, and confusion.  He indicated that he developed myalgias the night before.  He described the pain in his right upper extremity near his bicep which was tender to touch.  He then indicated that they resolved without intervention.  He was curious to know if this was related to his new medications.  At the time of our evaluation he denied any bicep pain, numbness, tingling or radiation down his arm.  He denied chest pain.  He denied any recent new exercise activities  or lifting.  Pain was nonradiating.  He denied symptoms of COVID-19 and other myalgias.  No deformity was noted near his bicep.  No discoloration, warmth or erythema.  It was felt his myalgias could be due to his spironolactone, or a muscle strain.  Blood pressure decreased to 154/108.  He was discharged in stable condition.  He returns to the clinic today for follow-up evaluation and states he feels well.  His breathing is much better.  He states that he is following a low-sodium diet and plans to begin walking daily with his mother.  He has no further arm pain.  He has been compliant with his medications and has not had any side effects.  Blood pressure is elevated today and he reports that it has been similar at home.  I will increase his losartan to 100 milligrams daily.  We will check a BMP today and in 1 week.  I will have him follow-up in 1 month.  Today he denies chest pain, shortness of breath, lower extremity edema, fatigue, palpitations, melena, hematuria, hemoptysis, diaphoresis, weakness, presyncope, syncope, orthopnea, and PND.     Home Medications    Prior to Admission medications   Medication Sig Start Date End Date Taking? Authorizing Provider  acetaminophen (TYLENOL) 500 MG tablet Take 1 tablet (500 mg total) by mouth every 6 (six) hours as needed for mild pain or headache (pain). Patient not taking: Reported on 12/20/2019 09/23/14   Zannie Cove, MD  acetaminophen-codeine (TYLENOL #3) 300-30 MG tablet Take 1 tablet by mouth every 4 (four) hours as needed for  moderate pain or severe pain.    [provider]  carvedilol (COREG) 25 MG tablet Take 1 tablet (25 mg total) by mouth 2 (two) times daily with a meal. 12/24/19   Cipriano Bunker, MD  losartan (COZAAR) 50 MG tablet Take 1 tablet (50 mg total) by mouth daily. 12/25/19   Cipriano Bunker, MD  spironolactone (ALDACTONE) 25 MG tablet Take 1 tablet (25 mg total) by mouth daily. 12/24/19   Cipriano Bunker, MD  amLODipine  (NORVASC) 10 MG tablet Take 1 tablet (10 mg total) by mouth daily. Patient not taking: Reported on 08/29/2014 08/25/14 08/29/14  Felicie Morn, NP  benazepril (LOTENSIN) 10 MG tablet Take 1 tablet (10 mg total) by mouth daily. Patient not taking: Reported on 08/29/2014 08/25/14 08/29/14  Felicie Morn, NP    Family History    Family History  Problem Relation Age of Onset  . Hypertension Mother   . Colon cancer Other    He indicated that his mother is alive. He indicated that his father is alive. He indicated that the status of his other is unknown.  Social History    Social History   Socioeconomic History  . Marital status: Single    Spouse name: Not on file  . Number of children: Not on file  . Years of education: Not on file  . Highest education level: Not on file  Occupational History  . Not on file  Tobacco Use  . Smoking status: Former Smoker    Types: Cigarettes  . Smokeless tobacco: Never Used  Substance and Sexual Activity  . Alcohol use: Yes    Comment: "drank a couple beers last weekend"  . Drug use: Yes    Types: Marijuana    Comment: Last use 10/20/14.  Marland Kitchen Sexual activity: Not on file  Other Topics Concern  . Not on file  Social History Narrative  . Not on file   Social Determinants of Health   Financial Resource Strain:   . Difficulty of Paying Living Expenses: Not on file  Food Insecurity:   . Worried About Programme researcher, broadcasting/film/video in the Last Year: Not on file  . Ran Out of Food in the Last Year: Not on file  Transportation Needs:   . Lack of Transportation (Medical): Not on file  . Lack of Transportation (Non-Medical): Not on file  Physical Activity:   . Days of Exercise per Week: Not on file  . Minutes of Exercise per Session: Not on file  Stress:   . Feeling of Stress : Not on file  Social Connections:   . Frequency of Communication with Friends and Family: Not on file  . Frequency of Social Gatherings with Friends and Family: Not on file  . Attends  Religious Services: Not on file  . Active Member of Clubs or Organizations: Not on file  . Attends Banker Meetings: Not on file  . Marital Status: Not on file  Intimate Partner Violence:   . Fear of Current or Ex-Partner: Not on file  . Emotionally Abused: Not on file  . Physically Abused: Not on file  . Sexually Abused: Not on file     Review of Systems    General:  No chills, fever, night sweats or weight changes.  Cardiovascular:  No chest pain, dyspnea on exertion, edema, orthopnea, palpitations, paroxysmal nocturnal dyspnea. Dermatological: No rash, lesions/masses Respiratory: No cough, dyspnea Urologic: No hematuria, dysuria Abdominal:   No nausea, vomiting, diarrhea, bright red blood per rectum,  melena, or hematemesis Neurologic:  No visual changes, wkns, changes in mental status. All other systems reviewed and are otherwise negative except as noted above.  Physical Exam    VS:  BP (!) 144/104 (BP Location: Left Arm, Patient Position: Sitting, Cuff Size: Large)   Pulse 72   Ht 6' (1.829 m)   Wt 266 lb 9.6 oz (120.9 kg)   SpO2 98%   BMI 36.16 kg/m  , BMI Body mass index is 36.16 kg/m. GEN: Well nourished, well developed, in no acute distress. HEENT: normal. Neck: Supple, no JVD, carotid bruits, or masses. Cardiac: RRR, no murmurs, rubs, or gallops. No clubbing, cyanosis, edema.  Radials/DP/PT 2+ and equal bilaterally.  Respiratory:  Respirations regular and unlabored, clear to auscultation bilaterally. GI: Soft, nontender, nondistended, BS + x 4. MS: no deformity or atrophy. Skin: warm and dry, no rash.  Cardiac catheterization site clean dry intact no drainage. Neuro:  Strength and sensation are intact. Psych: Normal affect.  Accessory Clinical Findings    Recent Labs: 12/20/2019: ALT 43; B Natriuretic Peptide 984.2 12/24/2019: BUN 15; Creatinine, Ser 1.18; Hemoglobin 11.1; Magnesium 2.2; Platelets 326; Potassium 3.9; Sodium 139   Recent Lipid  Panel    Component Value Date/Time   CHOL 183 12/21/2019 0443   TRIG 67 12/21/2019 0443   HDL 28 (L) 12/21/2019 0443   CHOLHDL 6.5 12/21/2019 0443   VLDL 13 12/21/2019 0443   LDLCALC 142 (H) 12/21/2019 0443    ECG personally reviewed by me today-none today.  Echocardiogram 12/21/2019 IMPRESSIONS    1. Left ventricular ejection fraction, by estimation, is <20%. The left  ventricle has severely decreased function. The left ventricle demonstrates  global hypokinesis. The left ventricular internal cavity size was  moderately dilated. There is moderate  concentric left ventricular hypertrophy. Left ventricular diastolic  parameters are consistent with Grade III diastolic dysfunction  (restrictive).  2. Right ventricular systolic function is severely reduced. The right  ventricular size is moderately enlarged.  3. Left atrial size was severely dilated.  4. Right atrial size was mildly dilated.  5. The mitral valve is normal in structure. Mild mitral valve  regurgitation. No evidence of mitral stenosis.  6. The aortic valve is normal in structure. Aortic valve regurgitation is  not visualized. No aortic stenosis is present.  7. There is mild dilatation of the ascending aorta measuring 40 mm.  8. The inferior vena cava is normal in size with greater than 50%  respiratory variability, suggesting right atrial pressure of 3 mmHg.  Cardiac catheterization 12/23/2019 CARDIAC CATHETERIZATION     History obtained from chart review. 49 y.o.malewith a hx of reported nonischemic cardiomyopathy (EF of 25% per echo in 2016 with Myoview in 2016 showing no perfusion defects),carotid artery disease (1-39% stenosis B/L),LVH, HTN, non-compliance, prior cocaine usewho was admitted with shortness of breath. His enzymes were mildly elevated but flat. His BNP was elevated. Repeat 2D echo revealed an EF of less than 20%. Apparently has not taken his medications for the last 5 years. He was  referred for right left heart cath to define his anatomy and physiology.  IMPRESSION: Mr. Badgett has normal coronary arteries, elevated LVEDP and fairly normal filling pressures. He has a nonischemic cardiomyopathy probably hypertensive in nature from failure to take his prescribed medications. The antecubital venous sheath was removed and pressure held. The radial sheath was removed and a TR band was placed on the right wrist to achieve patent hemostasis. The patient left lab in stable condition.  He'll be transferred back to [facility] once the TR band is off and resume guideline directed optimal medical treatment. He left the lab in stable condition.   Diagnostic Dominance: Right  Intervention    Assessment & Plan   1.  Acute on chronic combined CHF/nonischemic cardiomyopathy-no increased DOE or shortness of breath today.  Echocardiogram showed less than 20% LVEF, grade 3 diastolic dysfunction, RV moderately enlarged with severely reduced systolic function.  Cardiac catheterization showed normal coronary arteries with elevated LVEDP.  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  Demand ischemia-no chest pain today.  High-sensitivity troponins were elevated at 147-125.  Cardiac catheterization showed normal coronary arteries Continue, carvedilol, spironolactone Increase losartan to 100 mg daily Heart healthy low-sodium diet-salty 6 given Increase physical activity as tolerated  Hypertensive urgency-blood pressure today 144/104.  History of noncompliance previously blood pressure noted to be 207/64 in the ED Continue , carvedilol, spironolactone Heart healthy low-sodium diet-salty 6 given Increase physical activity as tolerated Increase losartan to  100 mg daily Keep blood pressure log and bring to next appointment  AKI-creatinine 1.18 on 12/24/2019 Continue to monitor  History of cocaine use/abuse-denies recent drug use Urine drug screen negative on prior admission  Disposition: Follow-up with Dr. Bjorn Pippin in 1 month.     Thomasene Ripple. Alexxia Stankiewicz NP-C    01/02/2020, 11:09 AM [facility] [address] Office [phone] Fax [phone]  Notice: This dictation was prepared with Dragon dictation along with smaller phrase technology. Any transcriptional errors that result from this process are unintentional and may not be corrected upon review.

## 2020-01-02 ENCOUNTER — Encounter: Payer: Self-pay | Admitting: General Practice

## 2020-01-02 ENCOUNTER — Ambulatory Visit (INDEPENDENT_AMBULATORY_CARE_PROVIDER_SITE_OTHER): Payer: Self-pay | Admitting: General Practice

## 2020-01-02 ENCOUNTER — Other Ambulatory Visit: Payer: Self-pay

## 2020-01-02 VITALS — BP 144/104 | HR 72 | Ht 72.0 in | Wt 266.6 lb

## 2020-01-02 DIAGNOSIS — I5043 Acute on chronic combined systolic (congestive) and diastolic (congestive) heart failure: Secondary | ICD-10-CM

## 2020-01-02 DIAGNOSIS — F1411 Cocaine abuse, in remission: Secondary | ICD-10-CM

## 2020-01-02 DIAGNOSIS — I248 Other forms of acute ischemic heart disease: Secondary | ICD-10-CM

## 2020-01-02 DIAGNOSIS — Z79899 Other long term (current) drug therapy: Secondary | ICD-10-CM

## 2020-01-02 DIAGNOSIS — I160 Hypertensive urgency: Secondary | ICD-10-CM

## 2020-01-02 DIAGNOSIS — N179 Acute kidney failure, unspecified: Secondary | ICD-10-CM

## 2020-01-02 DIAGNOSIS — I428 Other cardiomyopathies: Secondary | ICD-10-CM

## 2020-01-02 LAB — BASIC METABOLIC PANEL
BUN/Creatinine Ratio: 14 (ref 9–20)
BUN: 20 mg/dL (ref 6–24)
CO2: 24 mmol/L (ref 20–29)
Calcium: 10.2 mg/dL (ref 8.7–10.2)
Chloride: 102 mmol/L (ref 96–106)
Creatinine, Ser: 1.39 mg/dL — ABNORMAL HIGH (ref 0.76–1.27)
GFR calc Af Amer: 69 mL/min/{1.73_m2} (ref >59)
GFR calc non Af Amer: 60 mL/min/{1.73_m2} (ref >59)
Glucose: 100 mg/dL — ABNORMAL HIGH (ref 65–99)
Potassium: 5.2 mmol/L (ref 3.5–5.2)
Sodium: 140 mmol/L (ref 134–144)

## 2020-01-02 MED ORDER — CARVEDILOL 25 MG PO TABS: 25.0000 mg | ORAL_TABLET | Freq: Two times a day (BID) | ORAL | 0 refills | Status: DC

## 2020-01-02 MED ORDER — LOSARTAN POTASSIUM 100 MG PO TABS: 100.0000 mg | ORAL_TABLET | Freq: Every day | ORAL | 3 refills | Status: DC

## 2020-01-02 MED ORDER — SPIRONOLACTONE 25 MG PO TABS: 25.0000 mg | ORAL_TABLET | Freq: Every day | ORAL | 2 refills | Status: DC

## 2020-01-02 MED FILL — LOSARTAN POTASSIUM 100 MG T: 100 | 30 days supply | Qty: 30 | Fill #0

## 2020-01-02 MED FILL — CARVEDILOL 25 MG TABLET: 25 | 30 days supply | Qty: 60 | Fill #0

## 2020-01-02 MED FILL — SPIRONOLACTONE 25 MG TABLET: 25 | 30 days supply | Qty: 30 | Fill #0

## 2020-01-02 NOTE — Patient Instructions (Signed)
Medication Instructions:  INCREASE LOSARTAN 100MG  DAILY *If you need a refill on your cardiac medications before your next appointment, please call your pharmacy*  Lab Work: BMET TODAY AND IN 1 WEEK If you have labs (blood work) drawn today and your tests are completely normal, you will receive your results only by:  MyChart Message (if you have MyChart) OR A paper copy in the mail.  If you have any lab test that is abnormal or we need to change your treatment, we will call you to review the results. You may go to any [facility] that is convenient for you however, we do have a lab in our office that is able to assist you. You DO NOT need an appointment for our lab. The lab is open 8:00am and closes at 4:00pm. Lunch 12:45 - 1:45pm.  Special Instructions PLEASE READ AND FOLLOW SALTY 6-ATTACHED  PLEASE INCREASE PHYSICAL ACTIVITY AS TOLERATED  Follow-Up: Your next appointment:  1 month(s) In Person with , MD -OR- JESSE CLEAVER, FNP-C  At [facility], you and your health needs are our priority.  As part of our continuing mission to provide you with exceptional heart care, we have created designated Provider Care Teams.  These Care Teams include your primary Cardiologist (physician) and Advanced Practice Providers (APPs -  Physician Assistants and Nurse Practitioners) who all work together to provide you with the care you need, when you need it.  We recommend signing up for the patient portal called "MyChart".  Sign up information is provided on this After Visit Summary.  MyChart is used to connect with patients for Virtual Visits (Telemedicine).  Patients are able to view lab/test results, encounter notes, upcoming appointments, etc.  Non-urgent messages can be sent to your provider as well.   To learn more about what you can do with MyChart, go to [facility].

## 2020-01-10 LAB — BASIC METABOLIC PANEL
BUN/Creatinine Ratio: 15 (ref 9–20)
BUN: 17 mg/dL (ref 6–24)
CO2: 25 mmol/L (ref 20–29)
Calcium: 10.1 mg/dL (ref 8.7–10.2)
Chloride: 105 mmol/L (ref 96–106)
Creatinine, Ser: 1.16 mg/dL (ref 0.76–1.27)
GFR calc Af Amer: 86 mL/min/{1.73_m2} (ref >59)
GFR calc non Af Amer: 74 mL/min/{1.73_m2} (ref >59)
Glucose: 100 mg/dL — ABNORMAL HIGH (ref 65–99)
Potassium: 5.1 mmol/L (ref 3.5–5.2)
Sodium: 142 mmol/L (ref 134–144)

## 2020-01-27 ENCOUNTER — Encounter: Payer: Self-pay | Admitting: Family Medicine

## 2020-01-27 ENCOUNTER — Ambulatory Visit: Payer: Self-pay | Attending: Family Medicine | Admitting: Family Medicine

## 2020-01-27 ENCOUNTER — Other Ambulatory Visit: Payer: Self-pay

## 2020-01-27 VITALS — BP 183/123 | HR 74 | Ht 72.0 in | Wt 275.4 lb

## 2020-01-27 DIAGNOSIS — I5043 Acute on chronic combined systolic (congestive) and diastolic (congestive) heart failure: Secondary | ICD-10-CM

## 2020-01-27 DIAGNOSIS — I10 Essential (primary) hypertension: Secondary | ICD-10-CM

## 2020-01-27 MED ORDER — CARVEDILOL 25 MG PO TABS: 37.5000 mg | ORAL_TABLET | Freq: Two times a day (BID) | ORAL | 3 refills | Status: DC

## 2020-01-27 MED FILL — CARVEDILOL 25 MG TABLET: 25 | 30 days supply | Qty: 60 | Fill #0

## 2020-01-27 NOTE — Patient Instructions (Signed)
Heart Failure Eating Plan Heart failure, also called congestive heart failure, occurs when your heart does not pump blood well enough to meet your body's needs for oxygen-rich blood. Heart failure is a long-term (chronic) condition. Living with heart failure can be challenging. However, following your health care provider's instructions about a healthy lifestyle and working with a diet and nutrition specialist (dietitian) to choose the right foods may help to improve your symptoms. What are tips for following this plan? Reading food labels  Check food labels for the amount of sodium per serving. Choose foods that have less than 140 mg (milligrams) of sodium in each serving.  Check food labels for the number of calories per serving. This is important if you need to limit your daily calorie intake to lose weight.  Check food labels for the serving size. If you eat more than one serving, you will be eating more sodium and calories than what is listed on the label.  Look for foods that are labeled as "sodium-free," "very low sodium," or "low sodium." ? Foods labeled as "reduced sodium" or "lightly salted" may still have more sodium than what is recommended for you. Cooking  Avoid adding salt when cooking. Ask your health care provider or dietitian before using salt substitutes.  Season food with salt-free seasonings, spices, or herbs. Check the label of seasoning mixes to make sure they do not contain salt.  Cook with heart-healthy oils, such as olive, canola, soybean, or sunflower oil.  Do not fry foods. Cook foods using low-fat methods, such as baking, boiling, grilling, and broiling.  Limit unhealthy fats when cooking by: ? Removing the skin from poultry, such as chicken. ? Removing all visible fats from meats. ? Skimming the fat off from stews, soups, and gravies before serving them. Meal planning   Limit your intake of: ? Processed, canned, or pre-packaged foods. ? Foods that are  high in trans fat, such as fried foods. ? Sweets, desserts, sugary drinks, and other foods with added sugar. ? Full-fat dairy products, such as whole milk.  Eat a balanced diet that includes: ? 4-5 servings of fruit each day and 4-5 servings of vegetables each day. At each meal, try to fill half of your plate with fruits and vegetables. ? Up to 6-8 servings of whole grains each day. ? Up to 2 servings of lean meat, poultry, or fish each day. One serving of meat is equal to 3 oz. This is about the same size as a deck of cards. ? 2 servings of low-fat dairy each day. ? Heart-healthy fats. Healthy fats called omega-3 fatty acids are found in foods such as flaxseed and cold-water fish like sardines, salmon, and mackerel.  Aim to eat 25-35 g (grams) of fiber a day. Foods that are high in fiber include apples, broccoli, carrots, beans, peas, and whole grains.  Do not add salt or condiments that contain salt (such as soy sauce) to foods before eating.  When eating at a restaurant, ask that your food be prepared with less salt or no salt, if possible.  Try to eat 2 or more vegetarian meals each week.  Eat more home-cooked food and eat less restaurant, buffet, and fast food. General information  Do not eat more than 2,300 mg of salt (sodium) a day. The amount of sodium that is recommended for you may be lower, depending on your condition.  Maintain a healthy body weight as directed. Ask your health care provider what a healthy weight is   for you. ? Check your weight every day. ? Work with your health care provider and dietitian to make a plan that is right for you to lose weight or maintain your current weight.  Limit how much fluid you drink. Ask your health care provider or dietitian how much fluid you can have each day.  Limit or avoid alcohol as told by your health care provider or dietitian. Recommended foods The items listed may not be a complete list. Talk with your dietitian about what  dietary choices are best for you. Fruits All fresh, frozen, and canned fruits. Dried fruits, such as raisins, prunes, and cranberries. Vegetables All fresh vegetables. Vegetables that are frozen without sauce or added salt. Low-sodium or sodium-free canned vegetables. Grains Bread with less than 80 mg of sodium per slice. Whole-wheat pasta, quinoa, and brown rice. Oats and oatmeal. Barley. Millet. Grits and cream of wheat. Whole-grain and whole-wheat cold cereal. Meats and other protein foods Lean cuts of meat. Skinless chicken and turkey. Fish with high omega-3 fatty acids, such as salmon, sardines, and other cold-water fishes. Eggs. Dried beans, peas, and edamame. Unsalted nuts and nut butters. Dairy Low-fat or nonfat (skim) milk and dried milk. Rice milk, soy milk, and almond milk. Low-fat or nonfat yogurt. Small amounts of reduced-sodium block cheese. Low-sodium cottage cheese. Fats and oils Olive, canola, soybean, flaxseed, or sunflower oil. Avocado. Sweets and desserts Apple sauce. Granola bars. Sugar-free pudding and gelatin. Frozen fruit bars. Seasoning and other foods Fresh and dried herbs. Lemon or lime juice. Vinegar. Low-sodium ketchup. Salt-free marinades, salad dressings, sauces, and seasonings. The items listed above may not be a complete list of foods and beverages you can eat. Contact a dietitian for more information. Foods to avoid The items listed may not be a complete list. Talk with your dietitian about what dietary choices are best for you. Fruits Fruits that are dried with sodium-containing preservatives. Vegetables Canned vegetables. Frozen vegetables with sauce or seasonings. Creamed vegetables. French fries. Onion rings. Pickled vegetables and sauerkraut. Grains Bread with more than 80 mg of sodium per slice. Hot or cold cereal with more than 140 mg sodium per serving. Salted pretzels and crackers. Pre-packaged breadcrumbs. Bagels, croissants, and biscuits. Meats  and other protein foods Ribs and chicken wings. Bacon, ham, pepperoni, bologna, salami, and packaged luncheon meats. Hot dogs, bratwurst, and sausage. Canned meat. Smoked meat and fish. Salted nuts and seeds. Dairy Whole milk, half-and-half, and cream. Buttermilk. Processed cheese, cheese spreads, and cheese curds. Regular cottage cheese. Feta cheese. Shredded cheese. String cheese. Fats and oils Butter, lard, shortening, ghee, and bacon fat. Canned and packaged gravies. Seasoning and other foods Onion salt, garlic salt, table salt, and sea salt. Marinades. Regular salad dressings. Relishes, pickles, and olives. Meat flavorings and tenderizers, and bouillon cubes. Horseradish, ketchup, and mustard. Worcestershire sauce. Teriyaki sauce, soy sauce (including reduced sodium). Hot sauce and Tabasco sauce. Steak sauce, fish sauce, oyster sauce, and cocktail sauce. Taco seasonings. Barbecue sauce. Tartar sauce. The items listed above may not be a complete list of foods and beverages you should avoid. Contact a dietitian for more information. Summary  A heart failure eating plan includes changes that limit your intake of sodium and unhealthy fat, and it may help you lose weight or maintain a healthy weight. Your health care provider may also recommend limiting how much fluid you drink.  Most people with heart failure should eat no more than 2,300 mg of salt (sodium) a day. The amount of sodium   that is recommended for you may be lower, depending on your condition.  Contact your health care provider or dietitian before making any major changes to your diet. This information is not intended to replace advice given to you by your health care provider. Make sure you discuss any questions you have with your health care provider. Document Revised: 06/14/2018 Document Reviewed: 09/02/2016 Elsevier Patient Education  2020 Elsevier Inc.  

## 2020-01-27 NOTE — Progress Notes (Signed)
Subjective:  Patient ID: Ronald Young, male    DOB: 12/22/1970  Age: 49 y.o. MRN: [id number]  CC: Hospitalization Follow-up   HPI [place] Ronald Young is a 49 year old male with a history of CHF (EF 25% from 12/2019), hypertension, cocaine abuse who presents today to establish care. Hospitalized at [facility] hospital from 12/20/2019 through 12/24/2019 for acute on chronic CHF. Cardiac cath at that time had revealed no coronary artery disease. Echocardiogram revealed: IMPRESSIONS  1. Left ventricular ejection fraction, by estimation, is <20%. The left  ventricle has severely decreased function. The left ventricle demonstrates  global hypokinesis. The left ventricular internal cavity size was  moderately dilated. There is moderate  concentric left ventricular hypertrophy. Left ventricular diastolic  parameters are consistent with Grade III diastolic dysfunction  (restrictive).  2. Right ventricular systolic function is severely reduced. The right  ventricular size is moderately enlarged.  3. Left atrial size was severely dilated.  4. Right atrial size was mildly dilated.  5. The mitral valve is normal in structure. Mild mitral valve  regurgitation. No evidence of mitral stenosis.  6. The aortic valve is normal in structure. Aortic valve regurgitation is  not visualized. No aortic stenosis is present.  7. There is mild dilatation of the ascending aorta measuring 40 mm.  8. The inferior vena cava is normal in size with greater than 50%  respiratory variability, suggesting right atrial pressure of 3 mmHg.     His BP is elevated at 183/121 and he took his medication an hour prior to coming here. Losartan was increased to 100mg  at Cardiology visit where his BP was 144/104 but he was called and advised to decrease back to 50mg  after his labs (likely due to hyperkalemia) Follows up with Cardiology in 1 week  Denies presence of dyspnea, pedal edema, orthopnea, chest pain. He has no  additional concerns today. Past Medical History:  Diagnosis Date  . Arthritis   . Cardiomyopathy (HCC) 09/22/2014  . Hypertension     Past Surgical History:  Procedure Laterality Date  . RIGHT/LEFT HEART CATH AND CORONARY ANGIOGRAPHY N/A 12/23/2019   Procedure: RIGHT/LEFT HEART CATH AND CORONARY ANGIOGRAPHY;  Surgeon: 09/24/2014, MD;  Location: [facility] INVASIVE CV LAB;  Service: Cardiovascular;  Laterality: N/A;    Family History  Problem Relation Age of Onset  . Hypertension Mother   . Colon cancer Other     Allergies  Allergen Reactions  . Strawberry Extract Hives    Outpatient Medications Prior to Visit  Medication Sig Dispense Refill  . acetaminophen (TYLENOL) 500 MG tablet Take 1 tablet (500 mg total) by mouth every 6 (six) hours as needed for mild pain or headache (pain). 30 tablet 0  . acetaminophen-codeine (TYLENOL #3) 300-30 MG tablet Take 1 tablet by mouth every 4 (four) hours as needed for moderate pain or severe pain.    12/25/2019 losartan (COZAAR) 100 MG tablet Take 1 tablet (100 mg total) by mouth daily. 30 tablet 3  . spironolactone (ALDACTONE) 25 MG tablet Take 1 tablet (25 mg total) by mouth daily. 30 tablet 2  . carvedilol (COREG) 25 MG tablet Take 1 tablet (25 mg total) by mouth 2 (two) times daily with a meal. 60 tablet 0   No facility-administered medications prior to visit.     ROS Review of Systems  Constitutional: Negative for activity change and appetite change.  HENT: Negative for sinus pressure and sore throat.   Eyes: Negative for visual disturbance.  Respiratory: Negative for cough,  chest tightness and shortness of breath.   Cardiovascular: Negative for chest pain and leg swelling.  Gastrointestinal: Negative for abdominal distention, abdominal pain, constipation and diarrhea.  Endocrine: Negative.   Genitourinary: Negative for dysuria.  Musculoskeletal: Negative for joint swelling and myalgias.  Skin: Negative for rash.  Allergic/Immunologic:  Negative.   Neurological: Negative for weakness, light-headedness and numbness.  Psychiatric/Behavioral: Negative for dysphoric mood and suicidal ideas.    Objective:  BP (!) 183/123   Pulse 74   Ht 6' (1.829 m)   Wt 275 lb 6.4 oz (124.9 kg)   SpO2 99%   BMI 37.35 kg/m   BP/Weight 01/27/2020 01/02/2020 12/29/2019  Systolic BP 183 144 147  Diastolic BP 123 104 126  Wt. (Lbs) 275.4 266.6 -  BMI 37.35 36.16 -      Physical Exam Constitutional:      Appearance: He is well-developed.  Neck:     Vascular: No JVD.  Cardiovascular:     Rate and Rhythm: Normal rate.     Heart sounds: Normal heart sounds. No murmur heard.   Pulmonary:     Effort: Pulmonary effort is normal.     Breath sounds: Normal breath sounds. No wheezing or rales.  Chest:     Chest wall: No tenderness.  Abdominal:     General: Bowel sounds are normal. There is no distension.     Palpations: Abdomen is soft. There is no mass.     Tenderness: There is no abdominal tenderness.  Musculoskeletal:        General: Normal range of motion.     Right lower leg: No edema.     Left lower leg: No edema.  Neurological:     Mental Status: He is alert and oriented to person, place, and time.  Psychiatric:        Mood and Affect: Mood normal.     CMP Latest Ref Rng & Units 01/09/2020 01/02/2020 12/24/2019  Glucose 65 - 99 mg/dL 937(T) 024(O) 973(Z)  BUN 6 - 24 mg/dL 17 20 15   Creatinine 0.76 - 1.27 mg/dL 3.29) 9.24(Q  Sodium 134 - 144 mmol/L 142 140 139  Potassium 3.5 - 5.2 mmol/L 5.1 5.2 3.9  Chloride 96 - 106 mmol/L 105 102 107  CO2 20 - 29 mmol/L 25 24 22   Calcium 8.7 - 10.2 mg/dL 6.83 8.9  Total Protein 6.5 - 8.1 g/dL - - -  Total Bilirubin 0.3 - 1.2 mg/dL - - -  Alkaline Phos 38 - 126 U/L - - -  AST 15 - 41 U/L - - -  ALT 0 - 44 U/L - - -    Lipid Panel     Component Value Date/Time   CHOL 183 12/21/2019 0443   TRIG 67 12/21/2019 0443   HDL 28 (L) 12/21/2019 0443   CHOLHDL 6.5 12/21/2019 0443    VLDL 13 12/21/2019 0443   LDLCALC 142 (H) 12/21/2019 0443    CBC    Component Value Date/Time   WBC 7.6 12/24/2019 0542   RBC 3.93 (L) 12/24/2019 0542   HGB 11.1 (L) 12/24/2019 0542   HCT 34.3 (L) 12/24/2019 0542   PLT 326 12/24/2019 0542   MCV 87.3 12/24/2019 0542   MCH 28.2 12/24/2019 0542   MCHC 32.4 12/24/2019 0542   RDW 15.5 12/24/2019 0542   LYMPHSABS 2.2 12/20/2019 1003   MONOABS 0.5 12/20/2019 1003   EOSABS 0.2 12/20/2019 1003   BASOSABS 0.1 12/20/2019 1003    Lab  Results  Component Value Date   HGBA1C 5.8 (H) 12/24/2019    Assessment & Plan:  1. Acute on chronic combined systolic and diastolic CHF (congestive heart failure) (HCC) EF of less than 20%, grade 3 DD, global hypokinesis from echo of 12/2019 He appears euvolemic today Continue losartan, Coreg, spironolactone He might benefit from [facility] but will defer to cardiology Discussed fluid restriction, cardiac diet Follow-up with cardiology  2. Essential hypertension Uncontrolled Coreg dose increased He has an upcoming appointment in 1 week where his blood pressure will be reevaluated Counseled on blood pressure goal of less than 130/80, low-sodium, DASH diet, medication compliance, 150 minutes of moderate intensity exercise per week. Discussed medication compliance, adverse effects. - carvedilol (COREG) 25 MG tablet; Take 1.5 tablets (37.5 mg total) by mouth 2 (two) times daily with a meal.  Dispense: 90 tablet; Refill: 3    Meds ordered this encounter  Medications  . carvedilol (COREG) 25 MG tablet    Sig: Take 1.5 tablets (37.5 mg total) by mouth 2 (two) times daily with a meal.    Dispense:  90 tablet    Refill:  3    Dose increase    Follow-up: Return in about 3 months (around 04/27/2020) for CHF.       Hoy Register, MD, FAAFP. [facility] [place], [place] [phone]   01/28/2020, 9:00 AM

## 2020-01-27 NOTE — Progress Notes (Signed)
Needs refills on medications.  Has had covid vaccine does not have card.

## 2020-02-05 ENCOUNTER — Other Ambulatory Visit: Payer: Self-pay

## 2020-02-05 ENCOUNTER — Encounter: Payer: Self-pay | Admitting: Cardiology

## 2020-02-05 ENCOUNTER — Ambulatory Visit (INDEPENDENT_AMBULATORY_CARE_PROVIDER_SITE_OTHER): Payer: Self-pay | Admitting: Cardiology

## 2020-02-05 VITALS — BP 160/102 | HR 77 | Ht 72.0 in | Wt 268.8 lb

## 2020-02-05 DIAGNOSIS — I10 Essential (primary) hypertension: Secondary | ICD-10-CM

## 2020-02-05 DIAGNOSIS — F1411 Cocaine abuse, in remission: Secondary | ICD-10-CM

## 2020-02-05 DIAGNOSIS — I5042 Chronic combined systolic (congestive) and diastolic (congestive) heart failure: Secondary | ICD-10-CM

## 2020-02-05 MED ORDER — HYDRALAZINE HCL 25 MG PO TABS: 25.0000 mg | ORAL_TABLET | Freq: Three times a day (TID) | ORAL | 3 refills | Status: DC

## 2020-02-05 NOTE — Patient Instructions (Signed)
Medication Instructions:  START hydralazine 25 mg three times daily (every 8 hours)  *If you need a refill on your cardiac medications before your next appointment, please call your pharmacy*  Follow-Up: At [facility], you and your health needs are our priority.  As part of our continuing mission to provide you with exceptional heart care, we have created designated Provider Care Teams.  These Care Teams include your primary Cardiologist (physician) and Advanced Practice Providers (APPs -  Physician Assistants and Nurse Practitioners) who all work together to provide you with the care you need, when you need it.  We recommend signing up for the patient portal called "MyChart".  Sign up information is provided on this After Visit Summary.  MyChart is used to connect with patients for Virtual Visits (Telemedicine).  Patients are able to view lab/test results, encounter notes, upcoming appointments, etc.  Non-urgent messages can be sent to your provider as well.   To learn more about what you can do with MyChart, go to [email or web address].    Your next appointment:   2 week(s) with pharmacist (blood pressure, medication titration) --bring your blood pressure log and blood pressure cuff to this appointment  2 months with Dr. Bjorn Pippin  Other Instructions Please check your blood pressure at home daily, write it down.  Call the office or send message via Mychart with the readings in 2 weeks for Dr. Bjorn Pippin to review.

## 2020-02-05 NOTE — Progress Notes (Signed)
Cardiology Office Note:    Date:  02/05/2020   ID:  Ronald Young, DOB 02-Jul-1970, MRN [id number]  PCP:  Patient, No Pcp Per  Cardiologist:  Little Ishikawa, MD  Electrophysiologist:  None   Referring MD: No ref. provider found   Chief Complaint  Patient presents with  . Congestive Heart Failure    History of Present Illness:    Ronald Young is a 49 y.o. male with a hx of nonischemic cardiomyopathy, mild bilateral carotid artery disease, hypertension, cocaine use who presents for follow-up.  He was admitted with decompensated heart failure and hypertensive urgency in August 2021, had been off his medications for years.  Echocardiogram 12/21/2019 showed LVEF less than 20% with global hypokinesis, moderate LVH, grade 3 diastolic dysfunction, severe RV dysfunction, mild MR, mild dilatation of the ascending aorta measuring 40 mm.  RHC/LHC on 12/23/2019 showed normal coronary arteries and filling pressures.  He was discharged on losartan 50 mg daily, carvedilol 25 mg twice daily, spironolactone 25 mg daily.  He was discharged on as needed Lasix, advised to monitor daily weights.  At follow-up appointment with Edd Fabian on 01/02/2020, losartan increased to 100 mg daily.  At follow-up with PCP on 9/27, carvedilol dose increased to 37.5 mg twice daily.  Since last clinic visit, he reports that he has been doing well.  He denies any chest pain or dyspnea.  Reports his weight has been stable.  He denies any lower extremity edema, lightheadedness, syncope, or palpitations.  States that he checks his BP at home but did not bring his log in, but has been as high as 160s.  States that he has applied for Medicaid.  He denies any further cocaine, tobacco, or alcohol use.  Does state that he smokes marijuana every 1 to 2 weeks.   Wt Readings from Last 3 Encounters:  02/05/20 268 lb 12.8 oz (121.9 kg)  01/27/20 275 lb 6.4 oz (124.9 kg)  01/02/20 266 lb 9.6 oz (120.9 kg)    Past Medical History:   Diagnosis Date  . Arthritis   . Cardiomyopathy (HCC) 09/22/2014  . Hypertension     Past Surgical History:  Procedure Laterality Date  . RIGHT/LEFT HEART CATH AND CORONARY ANGIOGRAPHY N/A 12/23/2019   Procedure: RIGHT/LEFT HEART CATH AND CORONARY ANGIOGRAPHY;  Surgeon: Runell Gess, MD;  Location: [facility];  Service: Cardiovascular;  Laterality: N/A;    Current Medications: Current Meds  Medication Sig  . acetaminophen (TYLENOL) 500 MG tablet Take 1 tablet (500 mg total) by mouth every 6 (six) hours as needed for mild pain or headache (pain).  Marland Kitchen acetaminophen-codeine (TYLENOL #3) 300-30 MG tablet Take 1 tablet by mouth every 4 (four) hours as needed for moderate pain or severe pain.  . carvedilol (COREG) 25 MG tablet Take 1.5 tablets (37.5 mg total) by mouth 2 (two) times daily with a meal.  . losartan (COZAAR) 100 MG tablet Take 1 tablet (100 mg total) by mouth daily.  Marland Kitchen spironolactone (ALDACTONE) 25 MG tablet Take 1 tablet (25 mg total) by mouth daily.     Allergies:   Strawberry extract   Social History   Socioeconomic History  . Marital status: Single    Spouse name: Not on file  . Number of children: Not on file  . Years of education: Not on file  . Highest education level: Not on file  Occupational History  . Not on file  Tobacco Use  . Smoking status: Former Smoker  Types: Cigarettes  . Smokeless tobacco: Never Used  Substance and Sexual Activity  . Alcohol use: Yes    Comment: "drank a couple beers last weekend"  . Drug use: Yes    Types: Marijuana    Comment: Last use 10/20/14.  Marland Kitchen Sexual activity: Not on file  Other Topics Concern  . Not on file  Social History Narrative  . Not on file   Social Determinants of Health   Financial Resource Strain:   . Difficulty of Paying Living Expenses: Not on file  Food Insecurity:   . Worried About Programme researcher, broadcasting/film/video in the Last Year: Not on file  . Ran Out of Food in the Last Year: Not on file   Transportation Needs:   . Lack of Transportation (Medical): Not on file  . Lack of Transportation (Non-Medical): Not on file  Physical Activity:   . Days of Exercise per Week: Not on file  . Minutes of Exercise per Session: Not on file  Stress:   . Feeling of Stress : Not on file  Social Connections:   . Frequency of Communication with Friends and Family: Not on file  . Frequency of Social Gatherings with Friends and Family: Not on file  . Attends Religious Services: Not on file  . Active Member of Clubs or Organizations: Not on file  . Attends Banker Meetings: Not on file  . Marital Status: Not on file     Family History: [name] family history includes Colon cancer in an other family member; Hypertension in his mother.  ROS:   Please see the history of present illness.     All other systems reviewed and are negative.  EKGs/Labs/Other Studies Reviewed:    The following studies were reviewed today:   EKG:  EKG is not ordered today.  \  Recent Labs: 12/20/2019: ALT 43; B Natriuretic Peptide 984.2 12/24/2019: Hemoglobin 11.1; Magnesium 2.2; Platelets 326 01/09/2020: BUN 17; Creatinine, Ser 1.16; Potassium 5.1; Sodium 142  Recent Lipid Panel    Component Value Date/Time   CHOL 183 12/21/2019 0443   TRIG 67 12/21/2019 0443   HDL 28 (L) 12/21/2019 0443   CHOLHDL 6.5 12/21/2019 0443   VLDL 13 12/21/2019 0443   LDLCALC 142 (H) 12/21/2019 0443    Physical Exam:    VS:  BP (!) 160/102   Pulse 77   Ht 6' (1.829 m)   Wt 268 lb 12.8 oz (121.9 kg)   SpO2 97%   BMI 36.46 kg/m     Wt Readings from Last 3 Encounters:  02/05/20 268 lb 12.8 oz (121.9 kg)  01/27/20 275 lb 6.4 oz (124.9 kg)  01/02/20 266 lb 9.6 oz (120.9 kg)     GEN: Well nourished, well developed in no acute distress HEENT: Normal NECK: No JVD; No carotid bruits LYMPHATICS: No lymphadenopathy CARDIAC: RRR, no murmurs, rubs, gallops RESPIRATORY:  Clear to auscultation without rales,  wheezing or rhonchi  ABDOMEN: Soft, non-tender, non-distended MUSCULOSKELETAL:  No edema; No deformity  SKIN: Warm and dry NEUROLOGIC:  Alert and oriented x 3 PSYCHIATRIC:  Normal affect   ASSESSMENT:    1. Chronic combined systolic and diastolic heart failure (HCC)   2. Essential hypertension   3. History of cocaine abuse (HCC)    PLAN:    Chronic combined systolic and diastolic heart failure: Echocardiogram 12/21/2019 showed LVEF less than 20% with global hypokinesis, moderate LVH, grade 3 diastolic dysfunction, severe RV dysfunction, mild MR, mild dilatation of  the ascending aorta measuring 40 mm.  RHC/LHC on 12/23/2019 showed normal coronary arteries and filling pressures.  Nonischemic cardiomyopathy, suspect due to uncontrolled hypertension.  -Continue losartan 100 mg daily.  Ideally would switch to [facility], but patient is self-pay -Continue carvedilol 37.5 mg twice daily -Continue spironolactone 25 mg daily -Add hydralazine 25 mg 3 times daily -As needed Lasix, currently appears euvolemic -Ideally would check cardiac MRI to evaluate etiology of nonischemic cardiomyopathy.  However holding off on further testing for now as he has applied for Medicaid and awaiting results.  Would also consider Zio patch to quantify PVC burden when able.  Hypertension: Continue losartan, carvedilol, spironolactone as above.  Cocaine use: Denies recent use, UDS negative on recent admission.  Encouraged continued cessation  RTC in 2 months.  Follow-up in 2 weeks in pharmacy clinic for further heart failure medication titration  Medication Adjustments/Labs and Tests Ordered: Current medicines are reviewed at length with the patient today.  Concerns regarding medicines are outlined above.  No orders of the defined types were placed in this encounter.  Meds ordered this encounter  Medications  . hydrALAZINE (APRESOLINE) 25 MG tablet    Sig: Take 1 tablet (25 mg total) by mouth 3 (three) times daily.     Dispense:  90 tablet    Refill:  3    Patient Instructions  Medication Instructions:  START hydralazine 25 mg three times daily (every 8 hours)  *If you need a refill on your cardiac medications before your next appointment, please call your pharmacy*  Follow-Up: At [facility], you and your health needs are our priority.  As part of our continuing mission to provide you with exceptional heart care, we have created designated Provider Care Teams.  These Care Teams include your primary Cardiologist (physician) and Advanced Practice Providers (APPs -  Physician Assistants and Nurse Practitioners) who all work together to provide you with the care you need, when you need it.  We recommend signing up for the patient portal called "MyChart".  Sign up information is provided on this After Visit Summary.  MyChart is used to connect with patients for Virtual Visits (Telemedicine).  Patients are able to view lab/test results, encounter notes, upcoming appointments, etc.  Non-urgent messages can be sent to your provider as well.   To learn more about what you can do with MyChart, go to [email or web address].    Your next appointment:   2 week(s) with pharmacist (blood pressure, medication titration) --bring your blood pressure log and blood pressure cuff to this appointment  2 months with Dr. Bjorn Pippin  Other Instructions Please check your blood pressure at home daily, write it down.  Call the office or send message via Mychart with the readings in 2 weeks for Dr. Bjorn Pippin to review.       Signed, Little Ishikawa, MD  02/05/2020 5:27 PM    [facility]

## 2020-02-20 MED FILL — CARVEDILOL 25 MG TABLET: 25 | 30 days supply | Qty: 90 | Fill #0

## 2020-02-25 ENCOUNTER — Encounter: Payer: Self-pay | Admitting: Pharmacist Clinician (PhC)/ Clinical Pharmacy Specialist

## 2020-02-25 ENCOUNTER — Ambulatory Visit (INDEPENDENT_AMBULATORY_CARE_PROVIDER_SITE_OTHER): Payer: Self-pay | Admitting: Pharmacist Clinician (PhC)/ Clinical Pharmacy Specialist

## 2020-02-25 ENCOUNTER — Other Ambulatory Visit: Payer: Self-pay

## 2020-02-25 DIAGNOSIS — I10 Essential (primary) hypertension: Secondary | ICD-10-CM

## 2020-02-25 DIAGNOSIS — I5043 Acute on chronic combined systolic (congestive) and diastolic (congestive) heart failure: Secondary | ICD-10-CM

## 2020-02-25 MED ORDER — LOSARTAN POTASSIUM 50 MG PO TABS: 75.0000 mg | ORAL_TABLET | Freq: Every day | ORAL | 3 refills | Status: DC

## 2020-02-25 MED ORDER — CARVEDILOL 25 MG PO TABS: 37.5000 mg | ORAL_TABLET | Freq: Two times a day (BID) | ORAL | 3 refills | Status: DC

## 2020-02-25 NOTE — Patient Instructions (Signed)
We will repeat labs when you come back in November to check kidney function  As soon as you hear anything about Medicaid approval, please call us (F[facility] Revoir/Raquel) at [phone] We can then get you started on Entresto and Farxiga/Jardiance  Take your BP meds as follows:  Increase losartan to 75 mg once daily.  (1.5 of the 50 mg tablets)  Continue with all other medications  Bring all of your meds, your BP cuff and your record of home blood pressures to your next appointment.  Exercise as you're able, try to walk approximately 30 minutes per day.  Keep salt intake to a minimum, especially watch canned and prepared boxed foods.  Eat more fresh fruits and vegetables and fewer canned items.  Avoid eating in fast food restaurants.    HOW TO TAKE YOUR BLOOD PRESSURE: . Rest 5 minutes before taking your blood pressure. .  Don't smoke or drink caffeinated beverages for at least 30 minutes before. . Take your blood pressure before (not after) you eat. . Sit comfortably with your back supported and both feet on the floor (don't cross your legs). . Elevate your arm to heart level on a table or a desk. . Use the proper sized cuff. It should fit smoothly and snugly around your bare upper arm. There should be enough room to slip a fingertip under the cuff. The bottom edge of the cuff should be 1 inch above the crease of the elbow. . Ideally, take 3 measurements at one sitting and record the average.

## 2020-02-25 NOTE — Progress Notes (Signed)
02/26/2020 Ronald Young October 30, 1970 [id number]   HPI:  Ronald Young is a 49 y.o. male patient of Dr Bjorn Pippin, with a PMH below who presents today for heart failure medication titration.  A recent echocardiogram (8/21) showed his LVEF at <20%.  At his most recent visit with Dr. Bjorn Pippin his blood pressure was elevated at 160/102 and hydralazine 25 mg tid was added to his regimen.  Shortly after that appointment labs showed an increase in his serum creatinine from 1.18 to 1.39 (18 % increase)  After dropping the dose back to 50 mg, it returned to 1.16.    He is in the office today for follow up medication titration and blood pressure management.  Patient is currently uninsured, but states he has applied for Medicaid.  Has not heard back from them at this time.  He notes that he ran out of the carvedilol 2-3 days ago and has not been able to get refilled.  Chart indicates patient has a history of cocaine abuse.  He is also on a high dose beta blocker.  That combination can lead to arterial constriction as well as rebound high blood pressure.  Patient notes that he has not used cocaine in about 5 years, although he does smoke marijuana a few times each month.    Past Medical History: CAD Mild bilateral carotid disease  hypertension Not well controlled, on carvedilol, losartan, hydralazine, spironolactone  Cocaine use Denies recent use (> 5 years)     Blood Pressure Goal:  130/80  Current Medications: losartan 50 mg qd, carvedilol 37.5 mg bid, spironolactone 25 mg qd  Family Hx: father 38 - not familiar with his history, mother 76 with hypertension; no siblings, 38 twins, one with hypertension  Social Hx: no alcohol, no tobacco, no regular caffeine - coke once every week or two  Diet: mostly home cooked, no added salt, uses Ms. Dash; no pork, mostly chicken, does air fry; plenty of vegetables, some canned (rinses and cooks in water)  Exercise: walk most days with mother   Home BP  readings: no current meter  Intolerances: strawberry  Labs: 9/21: Na 142, K 5.1, Glu 100, BUN 17, SCr 1.16  Wt Readings from Last 3 Encounters:  02/05/20 268 lb 12.8 oz (121.9 kg)  01/27/20 275 lb 6.4 oz (124.9 kg)  01/02/20 266 lb 9.6 oz (120.9 kg)   BP Readings from Last 3 Encounters:  02/05/20 (!) 160/102  01/27/20 (!) 183/123  01/02/20 (!) 144/104   Pulse Readings from Last 3 Encounters:  02/05/20 77  01/27/20 74  01/02/20 72    Current Outpatient Medications  Medication Sig Dispense Refill  . carvedilol (COREG) 25 MG tablet Take 1.5 tablets (37.5 mg total) by mouth 2 (two) times daily with a meal. 270 tablet 3  . hydrALAZINE (APRESOLINE) 25 MG tablet Take 1 tablet (25 mg total) by mouth 3 (three) times daily. 90 tablet 3  . Multiple Vitamins-Minerals (MULTIVITAMIN WITH MINERALS) tablet Take 1 tablet by mouth daily.    Marland Kitchen spironolactone (ALDACTONE) 25 MG tablet Take 1 tablet (25 mg total) by mouth daily. 30 tablet 2  . acetaminophen-codeine (TYLENOL #3) 300-30 MG tablet Take 1 tablet by mouth every 4 (four) hours as needed for moderate pain or severe pain.    Marland Kitchen losartan (COZAAR) 50 MG tablet Take 1.5 tablets (75 mg total) by mouth daily. 45 tablet 3   No current facility-administered medications for this visit.    Allergies  Allergen Reactions  .  Strawberry Extract Hives    Past Medical History:  Diagnosis Date  . Arthritis   . Cardiomyopathy (HCC) 09/22/2014  . Hypertension     There were no vitals taken for this visit.  Acute on chronic combined systolic and diastolic CHF (congestive heart failure) (HCC) Patient with HFrEF, currently < 20% by echo.  Unfortunately patient is currently uninsured, which makes it difficult to get him on guideline directed therapy.  Will increase losartan to 75 mg today and repeat kidney function in 2 weeks.  Would ideally like to switch him to [facility], with his blood pressure elevations, he should easily tolerate the 97/103 mg dose,  should his kidneys allow.  Would also like to get him on [place] or Farxiga.  He is aware that he needs to call us as soon as he hears a response from Medicaid.  If for some reason he doesn't qualify, we will have him fill out paperwork to get these medications thru patient assistance programs. We will see him again in 3 weeks, and at that time, should we still be unsure on the Medicare, can have our office social worker, [name] reach out to help him as well.   Phillips Hay PharmD CPP [facility] [address] [place], [place] [address] [phone]

## 2020-02-26 NOTE — Assessment & Plan Note (Signed)
Patient with HFrEF, currently < 20% by echo.  Unfortunately patient is currently uninsured, which makes it difficult to get him on guideline directed therapy.  Will increase losartan to 75 mg today and repeat kidney function in 2 weeks.  Would ideally like to switch him to [facility], with his blood pressure elevations, he should easily tolerate the 97/103 mg dose, should his kidneys allow.  Would also like to get him on [place] or Farxiga.  He is aware that he needs to call us as soon as he hears a response from Medicaid.  If for some reason he doesn't qualify, we will have him fill out paperwork to get these medications thru patient assistance programs. We will see him again in 3 weeks, and at that time, should we still be unsure on the Medicare, can have our office social worker, [name] reach out to help him as well.

## 2020-03-19 ENCOUNTER — Ambulatory Visit (INDEPENDENT_AMBULATORY_CARE_PROVIDER_SITE_OTHER): Payer: Self-pay | Admitting: Pharmacist

## 2020-03-19 VITALS — BP 152/94 | HR 60 | Ht 72.0 in | Wt 274.0 lb

## 2020-03-19 DIAGNOSIS — I429 Cardiomyopathy, unspecified: Secondary | ICD-10-CM

## 2020-03-19 LAB — BASIC METABOLIC PANEL
BUN/Creatinine Ratio: 19 (ref 9–20)
BUN: 22 mg/dL (ref 6–24)
CO2: 23 mmol/L (ref 20–29)
Calcium: 10.2 mg/dL (ref 8.7–10.2)
Chloride: 103 mmol/L (ref 96–106)
Creatinine, Ser: 1.14 mg/dL (ref 0.76–1.27)
GFR calc Af Amer: 87 mL/min/{1.73_m2} (ref >59)
GFR calc non Af Amer: 76 mL/min/{1.73_m2} (ref >59)
Glucose: 104 mg/dL — ABNORMAL HIGH (ref 65–99)
Potassium: 4.7 mmol/L (ref 3.5–5.2)
Sodium: 138 mmol/L (ref 134–144)

## 2020-03-19 MED ORDER — SPIRONOLACTONE 25 MG PO TABS
25.0000 mg | ORAL_TABLET | Freq: Every day | ORAL | 2 refills | Status: DC
Start: 2020-03-19 — End: 2020-06-24

## 2020-03-19 MED ORDER — LOSARTAN POTASSIUM 50 MG PO TABS: 75.0000 mg | ORAL_TABLET | Freq: Every day | ORAL | 3 refills | Status: DC

## 2020-03-19 NOTE — Patient Instructions (Addendum)
Return for a  follow up appointment in 3 weeks (with DR Bjorn Pippin)  Go to the lab in TODAY  Check your blood pressure at home daily (if able) and keep record of the readings.  Take your BP meds as follows: *NO MEDICATION CHANGE* until blood work results, plan to increase losartan to 100mg  daily if renal function and electrolytes remain stable*  Bring all of your meds, your BP cuff and your record of home blood pressures to your next appointment.  Exercise as you're able, try to walk approximately 30 minutes per day.  Keep salt intake to a minimum, especially watch canned and prepared boxed foods.  Eat more fresh fruits and vegetables and fewer canned items.  Avoid eating in fast food restaurants.    HOW TO TAKE YOUR BLOOD PRESSURE: . Rest 5 minutes before taking your blood pressure. .  Don't smoke or drink caffeinated beverages for at least 30 minutes before. . Take your blood pressure before (not after) you eat. . Sit comfortably with your back supported and both feet on the floor (don't cross your legs). . Elevate your arm to heart level on a table or a desk. . Use the proper sized cuff. It should fit smoothly and snugly around your bare upper arm. There should be enough room to slip a fingertip under the cuff. The bottom edge of the cuff should be 1 inch above the crease of the elbow. . Ideally, take 3 measurements at one sitting and record the average.

## 2020-03-19 NOTE — Progress Notes (Signed)
HPI:  Ronald Young is a 49 y.o. male patient of Dr Bjorn Pippin, with a PMH below who presents today for heart failure medication titration.  A recent echocardiogram (8/21) showed his LVEF at <20%.  At his most recent visit with Dr. Bjorn Pippin his blood pressure was elevated at 160/102 and hydralazine 25 mg tid was added to his regimen.  Shortly after that appointment labs showed an increase in his serum creatinine from 1.18 to 1.39 (18 % increase)  After dropping the dose back to 50 mg, it returned to 1.16.  Losartan was increased back to 75mg  daily during lat OV with repeat BMEt after 2 weeks. BMET repeat was not completed by patient.  He is in the office today for follow up medication titration and blood pressure management.  Patient is currently uninsured, and still waiting for Medicare decision. Patient report compliance with medication, but reports not having BP cuff at home and unable to monitor BP on regular bases.   Past Medical History: CAD Mild bilateral carotid disease  hypertension Not well controlled, on carvedilol, losartan, hydralazine, spironolactone  Cocaine use Denies recent use (> 5 years)     Blood Pressure Goal:  130/80  Current Medications:  losartan 75 mg daily  Hydralazine 25mg  TID (10, 6 , 10)  carvedilol 37.5 mg twice daily (10am & 6pm)  spironolactone 25 mg daily (10am)  Family Hx: father 57 - not familiar with his history, mother 34 with hypertension; no siblings, 77 twins, one with hypertension  Social Hx: no alcohol, no tobacco, no regular caffeine - coke once every week or two  Diet: mostly home cooked, no added salt, uses Ms. Dash; no pork, mostly chicken, does air fry; plenty of vegetables, some canned (rinses and cooks in water)  Exercise: walks 3 times per week - decreased now due to weather change  Home BP readings: no current meter  Intolerances: strawberry  Labs: 9/21: Na 142, K 5.1, Glu 100, BUN 17, SCr 1.16  Wt Readings from Last 3  Encounters:  03/19/20 274 lb (124.3 kg)  02/05/20 268 lb 12.8 oz (121.9 kg)  01/27/20 275 lb 6.4 oz (124.9 kg)   BP Readings from Last 3 Encounters:  03/19/20 (!) 152/94  02/05/20 (!) 160/102  01/27/20 (!) 183/123   Pulse Readings from Last 3 Encounters:  03/19/20 60  02/05/20 77  01/27/20 74    Current Outpatient Medications  Medication Sig Dispense Refill  . acetaminophen-codeine (TYLENOL #3) 300-30 MG tablet Take 1 tablet by mouth every 4 (four) hours as needed for moderate pain or severe pain.    . carvedilol (COREG) 25 MG tablet Take 1.5 tablets (37.5 mg total) by mouth 2 (two) times daily with a meal. 270 tablet 3  . hydrALAZINE (APRESOLINE) 25 MG tablet Take 1 tablet (25 mg total) by mouth 3 (three) times daily. 90 tablet 3  . losartan (COZAAR) 50 MG tablet Take 1.5 tablets (75 mg total) by mouth daily. 45 tablet 3  . Multiple Vitamins-Minerals (MULTIVITAMIN WITH MINERALS) tablet Take 1 tablet by mouth daily.    04/06/20 spironolactone (ALDACTONE) 25 MG tablet Take 1 tablet (25 mg total) by mouth daily. 30 tablet 2   No current facility-administered medications for this visit.    Allergies  Allergen Reactions  . Strawberry Extract Hives    Past Medical History:  Diagnosis Date  . Arthritis   . Cardiomyopathy (HCC) 09/22/2014  . Hypertension     Blood pressure (!) 152/94, pulse 60,  height 6' (1.829 m), weight 274 lb (124.3 kg), SpO2 97 %.  Cardiomyopathy BP remains appropriate for ARB titration. Will repeat BMET today, and increase losartan to 50mg  BID if renal function and electrolytes remain stable. Paperwork for [facility] patient assistance was provided, and free BP cuff was given to patient today as well. I teached him how to use BP cuff and verify accuracy of device provided.   Patient was instructed to monitor BP twice daily, and bring records to f/u with Dr [facility]. He should complete form for [employer] patient assistance and bring to OV follow up in 3 weeks. If  Medicare/medicaid still not approved, we will submit forms to [employer] for uninsured patient. Plan to transition from losartan to [facility] 49/51mg  twice daily once patient is able to afford therapy.   Masaru Chamberlin Rodriguez-Guzman PharmD, BCPS, CPP [facility] [address] [place] 03/24/2020 2:05 PM

## 2020-03-24 ENCOUNTER — Encounter: Payer: Self-pay | Admitting: Pharmacist

## 2020-03-24 ENCOUNTER — Telehealth: Payer: Self-pay

## 2020-03-24 NOTE — Assessment & Plan Note (Signed)
BP remains appropriate for ARB titration. Will repeat BMET today, and increase losartan to 50mg  BID if renal function and electrolytes remain stable. Paperwork for [facility] patient assistance was provided, and free BP cuff was given to patient today as well. I teached him how to use BP cuff and verify accuracy of device provided.   Patient was instructed to monitor BP twice daily, and bring records to f/u with Dr [facility]. He should complete form for [employer] patient assistance and bring to OV follow up in 3 weeks. If Medicare/medicaid still not approved, we will submit forms to [employer] for uninsured patient. Plan to transition from losartan to [facility] 49/51mg  twice daily once patient is able to afford therapy.

## 2020-03-24 NOTE — Telephone Encounter (Signed)
-----   Message from Pearletha Furl, RPH-CPP sent at 03/24/2020  1:52 PM EST ----- Regarding: Losartan Please call patient.   Let him know that his bllood work is stable and he should go ahead and increase losartan to 50mg  twice daily.   He already has follow up appt with DR in 2 weeks. No need for f/u with Bjorn Pippin for now.  Thanks

## 2020-03-24 NOTE — Telephone Encounter (Signed)
Called and spoke w/pt and stated labs are stable and to increase losartan to 50mg  BID. Pt voiced understanding

## 2020-04-06 ENCOUNTER — Other Ambulatory Visit: Payer: Self-pay

## 2020-04-06 ENCOUNTER — Ambulatory Visit (INDEPENDENT_AMBULATORY_CARE_PROVIDER_SITE_OTHER): Payer: Self-pay | Admitting: Cardiology

## 2020-04-06 ENCOUNTER — Telehealth: Payer: Self-pay | Admitting: *Deleted

## 2020-04-06 ENCOUNTER — Encounter: Payer: Self-pay | Admitting: Cardiology

## 2020-04-06 VITALS — BP 138/77 | HR 60 | Ht 72.0 in | Wt 278.0 lb

## 2020-04-06 DIAGNOSIS — I5042 Chronic combined systolic (congestive) and diastolic (congestive) heart failure: Secondary | ICD-10-CM

## 2020-04-06 DIAGNOSIS — K219 Gastro-esophageal reflux disease without esophagitis: Secondary | ICD-10-CM

## 2020-04-06 DIAGNOSIS — I10 Essential (primary) hypertension: Secondary | ICD-10-CM

## 2020-04-06 MED ORDER — ISOSORBIDE MONONITRATE ER 30 MG PO TB24: 30.0000 mg | ORAL_TABLET | Freq: Every day | ORAL | 3 refills | Status: DC

## 2020-04-06 MED ORDER — FAMOTIDINE 10 MG PO TABS: 10.0000 mg | ORAL_TABLET | Freq: Two times a day (BID) | ORAL | 3 refills | Status: DC

## 2020-04-06 NOTE — Progress Notes (Signed)
Cardiology Office Note:    Date:  04/07/2020   ID:  Ronald Young, DOB [birth year], MRN [id number]  PCP:  Patient, No Pcp Per  Cardiologist:  Little Ishikawa, MD  Electrophysiologist:  None   Referring MD: No ref. provider found   Chief Complaint  Patient presents with  . Congestive Heart Failure    History of Present Illness:    Ronald Young is a 49 y.o. male with a hx of nonischemic cardiomyopathy, mild bilateral carotid artery disease, hypertension, cocaine use who presents for follow-up.  He was admitted with decompensated heart failure and hypertensive urgency in August 2021, had been off his medications for years.  Echocardiogram 12/21/2019 showed LVEF less than 20% with global hypokinesis, moderate LVH, grade 3 diastolic dysfunction, severe RV dysfunction, mild MR, mild dilatation of the ascending aorta measuring 40 mm.  RHC/LHC on 12/23/2019 showed normal coronary arteries and filling pressures.  He was discharged on losartan 50 mg daily, carvedilol 25 mg twice daily, spironolactone 25 mg daily.  He was discharged on as needed Lasix, advised to monitor daily weights.     Since last clinic visit, he reports that he has been doing well.  States that BP has been well controlled.  He denies any chest pain or dyspnea.  Does report he has been having some heartburn with his medications.  Uses baking soda and symptoms improved.  He denies any lightheadedness, syncope, lower extremity edema, or palpitations.  Denies any further cocaine use.   Wt Readings from Last 3 Encounters:  04/06/20 278 lb (126.1 kg)  03/19/20 274 lb (124.3 kg)  02/05/20 268 lb 12.8 oz (121.9 kg)   BP Readings from Last 3 Encounters:  04/06/20 138/77  03/19/20 (!) 152/94  02/05/20 (!) 160/102     Past Medical History:  Diagnosis Date  . Arthritis   . Cardiomyopathy (HCC) 09/22/2014  . Hypertension     Past Surgical History:  Procedure Laterality Date  . RIGHT/LEFT HEART CATH AND CORONARY  ANGIOGRAPHY N/A 12/23/2019   Procedure: RIGHT/LEFT HEART CATH AND CORONARY ANGIOGRAPHY;  Surgeon: Runell Gess, MD;  Location: [facility];  Service: Cardiovascular;  Laterality: N/A;    Current Medications: Current Meds  Medication Sig  . acetaminophen-codeine (TYLENOL #3) 300-30 MG tablet Take 1 tablet by mouth every 4 (four) hours as needed for moderate pain or severe pain.  . carvedilol (COREG) 25 MG tablet Take 1.5 tablets (37.5 mg total) by mouth 2 (two) times daily with a meal.  . hydrALAZINE (APRESOLINE) 25 MG tablet Take 1 tablet (25 mg total) by mouth 3 (three) times daily.  Marland Kitchen losartan (COZAAR) 50 MG tablet Take 1.5 tablets (75 mg total) by mouth daily.  . Multiple Vitamins-Minerals (MULTIVITAMIN WITH MINERALS) tablet Take 1 tablet by mouth daily.  Marland Kitchen spironolactone (ALDACTONE) 25 MG tablet Take 1 tablet (25 mg total) by mouth daily.     Allergies:   Strawberry extract   Social History   Socioeconomic History  . Marital status: Single    Spouse name: Not on file  . Number of children: Not on file  . Years of education: Not on file  . Highest education level: Not on file  Occupational History  . Not on file  Tobacco Use  . Smoking status: Former Smoker    Types: Cigarettes  . Smokeless tobacco: Never Used  Substance and Sexual Activity  . Alcohol use: Yes    Comment: "drank a couple beers last weekend"  . Drug  use: Yes    Types: Marijuana    Comment: Last use 10/20/14.  Marland Kitchen Sexual activity: Not on file  Other Topics Concern  . Not on file  Social History Narrative  . Not on file   Social Determinants of Health   Financial Resource Strain:   . Difficulty of Paying Living Expenses: Not on file  Food Insecurity:   . Worried About Programme researcher, broadcasting/film/video in the Last Year: Not on file  . Ran Out of Food in the Last Year: Not on file  Transportation Needs:   . Lack of Transportation (Medical): Not on file  . Lack of Transportation (Non-Medical): Not on file   Physical Activity:   . Days of Exercise per Week: Not on file  . Minutes of Exercise per Session: Not on file  Stress:   . Feeling of Stress : Not on file  Social Connections:   . Frequency of Communication with Friends and Family: Not on file  . Frequency of Social Gatherings with Friends and Family: Not on file  . Attends Religious Services: Not on file  . Active Member of Clubs or Organizations: Not on file  . Attends Banker Meetings: Not on file  . Marital Status: Not on file     Family History: [name] family history includes Colon cancer in an other family member; Hypertension in his mother.  ROS:   Please see the history of present illness.     All other systems reviewed and are negative.  EKGs/Labs/Other Studies Reviewed:    The following studies were reviewed today:   EKG:  EKG is not ordered today.    Recent Labs: 12/20/2019: ALT 43; B Natriuretic Peptide 984.2 12/24/2019: Hemoglobin 11.1; Magnesium 2.2; Platelets 326 03/19/2020: BUN 22; Creatinine, Ser 1.14; Potassium 4.7; Sodium 138  Recent Lipid Panel    Component Value Date/Time   CHOL 183 12/21/2019 0443   TRIG 67 12/21/2019 0443   HDL 28 (L) 12/21/2019 0443   CHOLHDL 6.5 12/21/2019 0443   VLDL 13 12/21/2019 0443   LDLCALC 142 (H) 12/21/2019 0443    Physical Exam:    VS:  BP 138/77   Pulse 60   Ht 6' (1.829 m)   Wt 278 lb (126.1 kg)   SpO2 96%   BMI 37.70 kg/m     Wt Readings from Last 3 Encounters:  04/06/20 278 lb (126.1 kg)  03/19/20 274 lb (124.3 kg)  02/05/20 268 lb 12.8 oz (121.9 kg)     GEN: Well nourished, well developed in no acute distress HEENT: Normal NECK: No JVD; No carotid bruits CARDIAC: RRR, no murmurs, rubs, gallops RESPIRATORY:  Clear to auscultation without rales, wheezing or rhonchi  ABDOMEN: Soft, non-tender, non-distended MUSCULOSKELETAL:  No edema; No deformity  SKIN: Warm and dry NEUROLOGIC:  Alert and oriented x 3 PSYCHIATRIC:  Normal  affect   ASSESSMENT:    1. Chronic combined systolic and diastolic heart failure (HCC)   2. Essential hypertension   3. Gastroesophageal reflux disease, unspecified whether esophagitis present    PLAN:    Chronic combined systolic and diastolic heart failure: Echocardiogram 12/21/2019 showed LVEF less than 20% with global hypokinesis, moderate LVH, grade 3 diastolic dysfunction, severe RV dysfunction, mild MR, mild dilatation of the ascending aorta measuring 40 mm.  RHC/LHC on 12/23/2019 showed normal coronary arteries and filling pressures.  Nonischemic cardiomyopathy, suspect due to uncontrolled hypertension.  -Continue losartan 75 mg daily.  Ideally would switch to [facility], but patient  is self-pay.  He brought in his application for medication assistance for Entresto, will submit -Continue carvedilol 37.5 mg twice daily -Continue spironolactone 25 mg daily -Continue hydralazine 25 mg 3 times daily.  Add Imdur 30 mg daily -As needed Lasix, currently appears euvolemic will check BMP, BNP -Plan echo prior to next clinic visit to monitor for improvement in systolic function.  If no improvement in systolic function despite GDMT, referred to EP for ICD evaluation -Ideally would check cardiac MRI to evaluate etiology of nonischemic cardiomyopathy.  However holding off on further testing for now as he has applied for Medicaid and awaiting results.  Would also consider Zio patch to quantify PVC burden when able.  Hypertension: Continue losartan, carvedilol, spironolactone, hydralazine/Imdur as above.  Cocaine use: Denies recent use, UDS negative on recent admission.  Encouraged continued cessation  GERD: will start famotidine  RTC in 2 months.  Follow-up in 2 weeks in pharmacy clinic for further heart failure medication titration  Medication Adjustments/Labs and Tests Ordered: Current medicines are reviewed at length with the patient today.  Concerns regarding medicines are outlined above.   Orders Placed This Encounter  Procedures  . Basic metabolic panel  . Brain natriuretic peptide  . ECHOCARDIOGRAM COMPLETE   Meds ordered this encounter  Medications  . isosorbide mononitrate (IMDUR) 30 MG 24 hr tablet    Sig: Take 1 tablet (30 mg total) by mouth daily.    Dispense:  90 tablet    Refill:  3  . famotidine (PEPCID) 10 MG tablet    Sig: Take 1 tablet (10 mg total) by mouth 2 (two) times daily.    Dispense:  60 tablet    Refill:  3    Patient Instructions  Medication Instructions:  START isosorbide (Imdur) 30 mg daily START famotidine (Pepcid) 10 mg two times daily  *If you need a refill on your cardiac medications before your next appointment, please call your pharmacy*   Lab Work: BMET, BNP today  If you have labs (blood work) drawn today and your tests are completely normal, you will receive your results only by: Marland Kitchen MyChart Message (if you have MyChart) OR . A paper copy in the mail If you have any lab test that is abnormal or we need to change your treatment, we will call you to review the results.   Testing/Procedures: Your physician has requested that you have an echocardiogram in 2 months. Echocardiography is a painless test that uses sound waves to create images of your heart. It provides your doctor with information about the size and shape of your heart and how well your heart's chambers and valves are working. This procedure takes approximately one hour. There are no restrictions for this procedure. This will be done at our [facility] location:  [address]  Follow-Up: At [facility], you and your health needs are our priority.  As part of our continuing mission to provide you with exceptional heart care, we have created designated Provider Care Teams.  These Care Teams include your primary Cardiologist (physician) and Advanced Practice Providers (APPs -  Physician Assistants and Nurse Practitioners) who all work together to  provide you with the care you need, when you need it.  We recommend signing up for the patient portal called "MyChart".  Sign up information is provided on this After Visit Summary.  MyChart is used to connect with patients for Virtual Visits (Telemedicine).  Patients are able to view lab/test results, encounter notes,  upcoming appointments, etc.  Non-urgent messages can be sent to your provider as well.   To learn more about what you can do with MyChart, go to [email or web address].    Your next appointment:   2 weeks with pharmacist 2 months with Dr. Bjorn Pippin (echo completed prior)    DSS in [facility] [phone] option 2-to check on status of application     Signed, Little Ishikawa, MD  04/07/2020 12:17 AM    [facility]

## 2020-04-06 NOTE — Progress Notes (Signed)
Heart and Vascular Care Navigation  04/06/2020  Ronald Young 1970-11-25 [id number]  Reason for Referral:  Pt Medicaid pending                                                                                                   Assessment:           CSW notified by Angie Fava, RN that pt Medicaid is pending and inquiring about any additional updates. I shared that generally it takes about 60-90 days sometimes more. I let her know that pt has an upcoming appointment w/ Dr Margarita Rana and it is important that he keep that appointment. Pt medications are all available on the $4 list at [facility] where per the chart they are sent. CSW was able to reach [employer] who assisted pt w/ filing for Medicaid and Disability. Per their liaison pt has submitted all needed documentation and it is under review with Disability Determination Services.   CSW met with pt prior to check out, I also encouraged him to keep appointment at [facility]. I provided him with CAFA application and shared that if he fills it out that there is a Development worker, community at [facility] and Wellness may be able to assist him w/ that application as an additional means of assisting with medical bills at this time.                             HRT/VAS Care Coordination    Patients Home Cardiology Office [place] Team Social Worker   Social Worker Name: [name], LCSW, [place] arrangements for the past 2 months Single Family Home   Lives with: Parents   Patient Current Insurance Coverage Self-Pay   Patient Has Concern With Paying Medical Bills Yes   Patient Concerns With Medical Bills previous hospitalizations, Medicaid pending status   Medical Bill Referrals: CAFA, Medicaid started w/ [employer]   Does Patient Have Prescription Coverage? No   Patient Prescription Assistance Programs Other   Other Assistance Programs Medications $4 list at [facility] [employer] None      Social History:                                                                             SDOH Screenings   Alcohol Screen:   . Last Alcohol Screening Score (AUDIT): Not on file  Depression (PHQ2-9): Low Risk   . PHQ-2 Score: 0  Financial Resource Strain:   . Difficulty of Paying Living Expenses: Not on file  Food Insecurity:   . Worried About Charity fundraiser in the Last Year: Not on file  . Ran Out of Food in the Last Year: Not  on file  Housing:   . Last Housing Risk Score: Not on file  Physical Activity:   . Days of Exercise per Week: Not on file  . Minutes of Exercise per Session: Not on file  Social Connections:   . Frequency of Communication with Friends and Family: Not on file  . Frequency of Social Gatherings with Friends and Family: Not on file  . Attends Religious Services: Not on file  . Active Member of Clubs or Organizations: Not on file  . Attends Archivist Meetings: Not on file  . Marital Status: Not on file  Stress:   . Feeling of Stress : Not on file  Tobacco Use: Medium Risk  . Smoking Tobacco Use: Former Smoker  . Smokeless Tobacco Use: Never Used  Transportation Needs:   . Film/video editor (Medical): Not on file  . Lack of Transportation (Non-Medical): Not on file    SDOH Interventions: Financial Resources:   CAFA application given, pt Medicaid/Disability pending at this time.     Other Care Navigation Interventions:     Provided Pharmacy assistance resources Other- pt able to afford medications (all on $4 list at [facility])   Follow-up plan:   Will make Clifton James, Development worker, community at [employer] and [facility]ness aware that I checked in w/ [employer] and provided pt with CAFA.   CSW remains available as needed moving forward for any pt concerns.

## 2020-04-06 NOTE — Patient Instructions (Addendum)
Medication Instructions:  START isosorbide (Imdur) 30 mg daily START famotidine (Pepcid) 10 mg two times daily  *If you need a refill on your cardiac medications before your next appointment, please call your pharmacy*   Lab Work: BMET, BNP today  If you have labs (blood work) drawn today and your tests are completely normal, you will receive your results only by: Marland Kitchen MyChart Message (if you have MyChart) OR . A paper copy in the mail If you have any lab test that is abnormal or we need to change your treatment, we will call you to review the results.   Testing/Procedures: Your physician has requested that you have an echocardiogram in 2 months. Echocardiography is a painless test that uses sound waves to create images of your heart. It provides your doctor with information about the size and shape of your heart and how well your heart's chambers and valves are working. This procedure takes approximately one hour. There are no restrictions for this procedure. This will be done at our [facility] location:  [address]  Follow-Up: At [facility], you and your health needs are our priority.  As part of our continuing mission to provide you with exceptional heart care, we have created designated Provider Care Teams.  These Care Teams include your primary Cardiologist (physician) and Advanced Practice Providers (APPs -  Physician Assistants and Nurse Practitioners) who all work together to provide you with the care you need, when you need it.  We recommend signing up for the patient portal called "MyChart".  Sign up information is provided on this After Visit Summary.  MyChart is used to connect with patients for Virtual Visits (Telemedicine).  Patients are able to view lab/test results, encounter notes, upcoming appointments, etc.  Non-urgent messages can be sent to your provider as well.   To learn more about what you can do with MyChart, go to [email or web address].     Your next appointment:   2 weeks with pharmacist 2 months with Dr. Bjorn Pippin (echo completed prior)    DSS in [facility] [phone] option 2-to check on status of application

## 2020-04-06 NOTE — Telephone Encounter (Signed)
Entresto patient assistance faxed to [employer] 

## 2020-04-07 LAB — BRAIN NATRIURETIC PEPTIDE: BNP: 122.5 pg/mL — ABNORMAL HIGH (ref 0.0–100.0)

## 2020-04-07 LAB — BASIC METABOLIC PANEL
BUN/Creatinine Ratio: 14 (ref 9–20)
BUN: 18 mg/dL (ref 6–24)
CO2: 23 mmol/L (ref 20–29)
Calcium: 10 mg/dL (ref 8.7–10.2)
Chloride: 104 mmol/L (ref 96–106)
Creatinine, Ser: 1.28 mg/dL — ABNORMAL HIGH (ref 0.76–1.27)
GFR calc Af Amer: 76 mL/min/{1.73_m2} (ref >59)
GFR calc non Af Amer: 66 mL/min/{1.73_m2} (ref >59)
Glucose: 77 mg/dL (ref 65–99)
Potassium: 4.5 mmol/L (ref 3.5–5.2)
Sodium: 141 mmol/L (ref 134–144)

## 2020-04-18 NOTE — Progress Notes (Signed)
Patient ID: Ronald Young                 DOB: 1970-09-16                      MRN: [id number]     HPI: Ronald Young is a 49 y.o. male referred by Dr. Bjorn Pippin to pharmacy clinic. PMH is significant for NICM, mild bilateral carotid artery disease, HTN, and cocaine use. ECHO on 12/21/19 showed LVEF <20% with global hypokinesis, moderate LVH, grade 3 diastolic dysfunction, severe RV dysfunction, mild MR, and mild dilatation of ascending aorta.  He was last seen by Dr. Bjorn Pippin on 04/06/20. At this time, he was doing well and denied any lightheadedness, syncope, LEE, or palpitations. He was started on isosorbide mononitrate and [employer] application for [name] patient assistance was submitted.  Patient arrives today for follow-up visit with pharmacy clinic. Overall, he is very pleased with his improvement in symptoms since starting his HF medications. He has trouble recalling his current medications but reports that he is tolerating isosorbide mononitrate well. He has noticed a mild, dull headache 1-2 times weekly but this does not bother him. He has some lightheadedness/dizziness when moving around but this is unchanged. Denies SOB, orthopnea, PND, or edema. His home weights have been stable ~279 lbs. He measures his BP at home which is usually around 140/90s. Follows low-sodium diet. He has not heard back about his [employer] patient assistance application or his Medicaid application.  Current HF meds: losartan 75 mg daily, carvedilol 37.5 mg BID (10am/6pm), spironolactone 25 mg daily (10am), hydralazine 25 mg TID (10am/6pm/10pm), isosorbide mononitrate 30 mg daily  Previously tried: lisinopril 10 mg daily, HCTZ 12.5 mg daily, benazepril 10 mg daily, amlodipine 10 mg daily  BP goal: <130/80 mmHg  Family History: father 6 - not familiar with his history, mother 16 with hypertension; no siblings, 59 twins, one with hypertension  Social History: no alcohol, no tobacco, no regular caffeine - coke once  every week or two  Diet: mostly home cooked, no added salt, uses Ms. Dash; no pork, mostly chicken, does air fry; plenty of vegetables, some canned (rinses and cooks in water)  Exercise: walks 3 times per week - decreased now due to weather change  Home BP readings: 140/90s (pt-reported, did not bring log)  Labs: 03/19/20: Scr 1.14, K 4.7, Na 138 04/06/20: Scr 1.28, K 4.5, Na 141, BNP 122.5  Wt Readings from Last 3 Encounters:  04/06/20 278 lb (126.1 kg)  03/19/20 274 lb (124.3 kg)  02/05/20 268 lb 12.8 oz (121.9 kg)   BP Readings from Last 3 Encounters:  04/06/20 138/77  03/19/20 (!) 152/94  02/05/20 (!) 160/102   Pulse Readings from Last 3 Encounters:  04/06/20 60  03/19/20 60  02/05/20 77    Renal function: Estimated Creatinine Clearance: 96.8 mL/min (A) (by C-G formula based on SCr of 1.28 mg/dL (H)).  Past Medical History:  Diagnosis Date  . Arthritis   . Cardiomyopathy (HCC) 09/22/2014  . Hypertension     Current Outpatient Medications on File Prior to Visit  Medication Sig Dispense Refill  . acetaminophen-codeine (TYLENOL #3) 300-30 MG tablet Take 1 tablet by mouth every 4 (four) hours as needed for moderate pain or severe pain.    . carvedilol (COREG) 25 MG tablet Take 1.5 tablets (37.5 mg total) by mouth 2 (two) times daily with a meal. 270 tablet 3  . famotidine (PEPCID) 10 MG tablet Take 1 tablet (10  mg total) by mouth 2 (two) times daily. 60 tablet 3  . hydrALAZINE (APRESOLINE) 25 MG tablet Take 1 tablet (25 mg total) by mouth 3 (three) times daily. 90 tablet 3  . isosorbide mononitrate (IMDUR) 30 MG 24 hr tablet Take 1 tablet (30 mg total) by mouth daily. 90 tablet 3  . losartan (COZAAR) 50 MG tablet Take 1.5 tablets (75 mg total) by mouth daily. 45 tablet 3  . Multiple Vitamins-Minerals (MULTIVITAMIN WITH MINERALS) tablet Take 1 tablet by mouth daily.    Marland Kitchen spironolactone (ALDACTONE) 25 MG tablet Take 1 tablet (25 mg total) by mouth daily. 30 tablet 2   No  current facility-administered medications on file prior to visit.    Allergies  Allergen Reactions  . Strawberry Extract Hives     Assessment/Plan:  1. Chronic combined systolic and diastolic heart failure - BP is elevated and above goal of <130/80 mmHg. He is euvolemic on exam today. Will increase losartan to 100 mg daily and increase hydralazine to 50 mg TID. Continue carvedilol 37.5 mg BID, spironolactone 25 mg daily, and isosorbide mononitrate 30 mg daily. Encouraged patient to maintain cardiac healthy diet that is low in sodium and to continue regular exercise regimen. Patient will bring BP readings and medications to next visit. He has a CHF follow-up appointment scheduled 12/29 with community health and wellness, added note for BMET to be checked at that visit. Will follow-up in office on 05/12/19.  Tama Headings, PharmD, BCPS PGY2 Cardiology Pharmacy Resident

## 2020-04-20 ENCOUNTER — Other Ambulatory Visit: Payer: Self-pay

## 2020-04-20 ENCOUNTER — Ambulatory Visit (INDEPENDENT_AMBULATORY_CARE_PROVIDER_SITE_OTHER): Payer: Self-pay | Admitting: Pharmacist

## 2020-04-20 VITALS — BP 148/86 | HR 63 | Wt 279.8 lb

## 2020-04-20 DIAGNOSIS — I10 Essential (primary) hypertension: Secondary | ICD-10-CM

## 2020-04-20 DIAGNOSIS — I5043 Acute on chronic combined systolic (congestive) and diastolic (congestive) heart failure: Secondary | ICD-10-CM

## 2020-04-20 MED ORDER — HYDRALAZINE HCL 50 MG PO TABS: 50.0000 mg | ORAL_TABLET | Freq: Three times a day (TID) | ORAL | 11 refills | Status: DC

## 2020-04-20 MED ORDER — LOSARTAN POTASSIUM 100 MG PO TABS: 100.0000 mg | ORAL_TABLET | Freq: Every day | ORAL | 3 refills | Status: DC

## 2020-04-20 NOTE — Patient Instructions (Addendum)
It was nice to meet you today   Your blood pressure goal is less than 130/24mmHg  INCREASE losartan to 100 mg daily.  INCREASE hydralazine to 50 mg THREE times daily.   Continue taking your carvedilol 37.5 mg twice daily, spironolactone 25 mg daily, and isosorbide mononitrate 30 mg daily.    Please continue to monitor your blood pressure at home. Bring your readings and your home cuff to your next appointment.  Please bring your medications to your next appointment.  Please call us at [phone] if you have any questions.

## 2020-04-23 NOTE — Telephone Encounter (Signed)
Received fax from [employer]-unable to process patient assistance application d/t patient phone number missing.    Demo sheet faxed to [employer] with patient phone number

## 2020-04-29 ENCOUNTER — Other Ambulatory Visit: Payer: Self-pay

## 2020-04-29 ENCOUNTER — Encounter: Payer: Self-pay | Admitting: Family Medicine

## 2020-04-29 ENCOUNTER — Ambulatory Visit: Payer: MEDICAID | Attending: Family Medicine | Admitting: Family Medicine

## 2020-04-29 VITALS — BP 137/77 | HR 66 | Ht 72.0 in | Wt 278.0 lb

## 2020-04-29 DIAGNOSIS — I5022 Chronic systolic (congestive) heart failure: Secondary | ICD-10-CM

## 2020-04-29 DIAGNOSIS — I110 Hypertensive heart disease with heart failure: Secondary | ICD-10-CM

## 2020-04-29 DIAGNOSIS — Z1211 Encounter for screening for malignant neoplasm of colon: Secondary | ICD-10-CM

## 2020-04-29 NOTE — Patient Instructions (Signed)
Colonoscopy, Adult A colonoscopy is a procedure to look at the entire large intestine. This procedure is done using a long, thin, flexible tube that has a camera on the end. You may have a colonoscopy:  As a part of normal colorectal screening.  If you have certain symptoms, such as: ? A low number of red blood cells in your blood (anemia). ? Diarrhea that does not go away. ? Pain in your abdomen. ? Blood in your stool. A colonoscopy can help screen for and diagnose medical problems, including:  Tumors.  Extra tissue that grows where mucus forms (polyps).  Inflammation.  Areas of bleeding. Tell your health care provider about:  Any allergies you have.  All medicines you are taking, including vitamins, herbs, eye drops, creams, and over-the-counter medicines.  Any problems you or family members have had with anesthetic medicines.  Any blood disorders you have.  Any surgeries you have had.  Any medical conditions you have.  Any problems you have had with having bowel movements.  Whether you are pregnant or may be pregnant. What are the risks? Generally, this is a safe procedure. However, problems may occur, including:  Bleeding.  Damage to your intestine.  Allergic reactions to medicines given during the procedure.  Infection. This is rare. What happens before the procedure? Eating and drinking restrictions Follow instructions from your health care provider about eating or drinking restrictions, which may include:  A few days before the procedure: ? Follow a low-fiber diet. ? Avoid nuts, seeds, dried fruit, raw fruits, and vegetables.  1-3 days before the procedure: ? Eat only gelatin dessert or ice pops. ? Drink only clear liquids, such as water, clear juice, clear broth or bouillon, black coffee or tea, or clear soft drinks or sports drinks. ? Avoid liquids that contain red or purple dye.  The day of the procedure: ? Do not eat solid foods. You may  continue to drink clear liquids until up to 2 hours before the procedure. ? Do not eat or drink anything starting 2 hours before the procedure, or within the time period that your health care provider recommends. Bowel prep If you were prescribed a bowel prep to take by mouth (orally) to clean out your colon:  Take it as told by your health care provider. Starting the day before your procedure, you will need to drink a large amount of liquid medicine. The liquid will cause you to have many bowel movements of loose stool until your stool becomes almost clear or light green.  If your skin or the opening between the buttocks (anus) gets irritated from diarrhea, you may relieve the irritation using: ? Wipes with medicine in them, such as adult wet wipes with aloe and vitamin E. ? A product to soothe skin, such as petroleum jelly.  If you vomit while drinking the bowel prep: ? Take a break for up to 60 minutes. ? Begin the bowel prep again. ? Call your health care provider if you keep vomiting or you cannot take the bowel prep without vomiting.  To clean out your colon, you may also be given: ? Laxative medicines. These help you have a bowel movement. ? Instructions for enema use. An enema is liquid medicine injected into your rectum. Medicines Ask your health care provider about:  Changing or stopping your regular medicines or supplements. This is especially important if you are taking iron supplements, diabetes medicines, or blood thinners.  Taking medicines such as aspirin and ibuprofen. These medicines   can thin your blood. Do not take these medicines unless your health care provider tells you to take them.  Taking over-the-counter medicines, vitamins, herbs, and supplements. General instructions  Ask your health care provider what steps will be taken to help prevent infection. These may include washing skin with a germ-killing soap.  Plan to have someone take you home from the hospital  or clinic. What happens during the procedure?   An IV will be inserted into one of your veins.  You may be given one or more of the following: ? A medicine to help you relax (sedative). ? A medicine to numb the area (local anesthetic). ? A medicine to make you fall asleep (general anesthetic). This is rarely needed.  You will lie on your side with your knees bent.  The tube will: ? Have oil or gel put on it (be lubricated). ? Be inserted into your anus. ? Be gently eased through all parts of your large intestine.  Air will be sent into your colon to keep it open. This may cause some pressure or cramping.  Images will be taken with the camera and will appear on a screen.  A small tissue sample may be removed to be looked at under a microscope (biopsy). The tissue may be sent to a lab for testing if any signs of problems are found.  If small polyps are found, they may be removed and checked for cancer cells.  When the procedure is finished, the tube will be removed. The procedure may vary among health care providers and hospitals. What happens after the procedure?  Your blood pressure, heart rate, breathing rate, and blood oxygen level will be monitored until you leave the hospital or clinic.  You may have a small amount of blood in your stool.  You may pass gas and have mild cramping or bloating in your abdomen. This is caused by the air that was used to open your colon during the exam.  Do not drive for 24 hours after the procedure.  It is up to you to get the results of your procedure. Ask your health care provider, or the department that is doing the procedure, when your results will be ready. Summary  A colonoscopy is a procedure to look at the entire large intestine.  Follow instructions from your health care provider about eating and drinking before the procedure.  If you were prescribed an oral bowel prep to clean out your colon, take it as told by your health care  provider.  During the colonoscopy, a flexible tube with a camera on its end is inserted into the anus and then passed into the other parts of the large intestine. This information is not intended to replace advice given to you by your health care provider. Make sure you discuss any questions you have with your health care provider. Document Revised: 11/09/2018 Document Reviewed: 11/09/2018 Elsevier Patient Education  2020 Elsevier Inc.  

## 2020-04-29 NOTE — Progress Notes (Signed)
Subjective:  Patient ID: Ronald Young, male    DOB: 04/07/1971  Age: 50 y.o. MRN: [id number]  CC: Hypertension   HPI Ronald Young is a 49 year old male with a history of heart failure with reduced EF (EF 25% from 12/2019), hypertension, cocaine abuse who presents today for follow-up visit. Seen by cardiology Dr. Epifanio Lesches 3 weeks ago at which time Imdur was added to his regimen with plans for referral to EP for ICD if no improvement in systolic function. [facility].  He denies dyspnea, pedal edema and has no orthopnea. He walks regularly. He is due for colonoscopy and informs me that his Grandma passed from Colon ca at the age of 60. Denies acute concerns today. Past Medical History:  Diagnosis Date   Arthritis    Cardiomyopathy (HCC) 09/22/2014   Hypertension     Past Surgical History:  Procedure Laterality Date   RIGHT/LEFT HEART CATH AND CORONARY ANGIOGRAPHY N/A 12/23/2019   Procedure: RIGHT/LEFT HEART CATH AND CORONARY ANGIOGRAPHY;  Surgeon: Runell Gess, MD;  Location: [facility];  Service: Cardiovascular;  Laterality: N/A;    Family History  Problem Relation Age of Onset   Hypertension Mother    Colon cancer Other     Allergies  Allergen Reactions   Strawberry Extract Hives    Outpatient Medications Prior to Visit  Medication Sig Dispense Refill   acetaminophen-codeine (TYLENOL #3) 300-30 MG tablet Take 1 tablet by mouth every 4 (four) hours as needed for moderate pain or severe pain.     carvedilol (COREG) 25 MG tablet Take 1.5 tablets (37.5 mg total) by mouth 2 (two) times daily with a meal. 270 tablet 3   hydrALAZINE (APRESOLINE) 50 MG tablet Take 1 tablet (50 mg total) by mouth 3 (three) times daily. 90 tablet 11   isosorbide mononitrate (IMDUR) 30 MG 24 hr tablet Take 1 tablet (30 mg total) by mouth daily. 90 tablet 3   losartan (COZAAR) 100 MG tablet Take 1 tablet (100 mg  total) by mouth daily. 90 tablet 3   Multiple Vitamins-Minerals (MULTIVITAMIN WITH MINERALS) tablet Take 1 tablet by mouth daily.     spironolactone (ALDACTONE) 25 MG tablet Take 1 tablet (25 mg total) by mouth daily. 30 tablet 2   famotidine (PEPCID) 10 MG tablet Take 1 tablet (10 mg total) by mouth 2 (two) times daily. (Patient not taking: Reported on 04/29/2020) 60 tablet 3   No facility-administered medications prior to visit.     ROS Review of Systems  Constitutional: Negative for activity change and appetite change.  HENT: Negative for sinus pressure and sore throat.   Eyes: Negative for visual disturbance.  Respiratory: Negative for cough, chest tightness and shortness of breath.   Cardiovascular: Negative for chest pain and leg swelling.  Gastrointestinal: Negative for abdominal distention, abdominal pain, constipation and diarrhea.  Endocrine: Negative.   Genitourinary: Negative for dysuria.  Musculoskeletal: Negative for joint swelling and myalgias.  Skin: Negative for rash.  Allergic/Immunologic: Negative.   Neurological: Negative for weakness, light-headedness and numbness.  Psychiatric/Behavioral: Negative for dysphoric mood and suicidal ideas.    Objective:  BP 137/77    Pulse 66    Ht 6' (1.829 m)    Wt 278 lb (126.1 kg)    SpO2 98%    BMI 37.70 kg/m   BP/Weight 04/29/2020 04/20/2020 04/06/2020  Systolic BP 137 148 138  Diastolic BP 77 86 77  Wt. (Lbs) 278  279.8 278  BMI 37.7 37.95 37.7      Physical Exam Constitutional:      Appearance: He is well-developed.  Neck:     Vascular: No JVD.  Cardiovascular:     Rate and Rhythm: Normal rate.     Heart sounds: Normal heart sounds. No murmur heard.   Pulmonary:     Effort: Pulmonary effort is normal.     Breath sounds: Normal breath sounds. No wheezing or rales.  Chest:     Chest wall: No tenderness.  Abdominal:     General: Bowel sounds are normal. There is no distension.     Palpations: Abdomen is  soft. There is no mass.     Tenderness: There is no abdominal tenderness.  Musculoskeletal:        General: Normal range of motion.     Right lower leg: No edema.     Left lower leg: No edema.  Neurological:     Mental Status: He is alert and oriented to person, place, and time.  Psychiatric:        Mood and Affect: Mood normal.     CMP Latest Ref Rng & Units 04/06/2020 03/19/2020 01/09/2020  Glucose 65 - 99 mg/dL 77 193(X) 902(I)  BUN 6 - 24 mg/dL 18 22 17   Creatinine 0.76 - 1.27 mg/dL ) 0.97(D 5.32  Sodium 134 - 144 mmol/L 141 138 142  Potassium 3.5 - 5.2 mmol/L 4.5 4.7 5.1  Chloride 96 - 106 mmol/L 104 103 105  CO2 20 - 29 mmol/L 23 23 25   Calcium 8.7 - 10.2 mg/dL 9.92 42.6  Total Protein 6.5 - 8.1 g/dL - - -  Total Bilirubin 0.3 - 1.2 mg/dL - - -  Alkaline Phos 38 - 126 U/L - - -  AST 15 - 41 U/L - - -  ALT 0 - 44 U/L - - -    Lipid Panel     Component Value Date/Time   CHOL 183 12/21/2019 0443   TRIG 67 12/21/2019 0443   HDL 28 (L) 12/21/2019 0443   CHOLHDL 6.5 12/21/2019 0443   VLDL 13 12/21/2019 0443   LDLCALC 142 (H) 12/21/2019 0443    CBC    Component Value Date/Time   WBC 7.6 12/24/2019 0542   RBC 3.93 (L) 12/24/2019 0542   HGB 11.1 (L) 12/24/2019 0542   HCT 34.3 (L) 12/24/2019 0542   PLT 326 12/24/2019 0542   MCV 87.3 12/24/2019 0542   MCH 28.2 12/24/2019 0542   MCHC 32.4 12/24/2019 0542   RDW 15.5 12/24/2019 0542   LYMPHSABS 2.2 12/20/2019 1003   MONOABS 0.5 12/20/2019 1003   EOSABS 0.2 12/20/2019 1003   BASOSABS 0.1 12/20/2019 1003    Lab Results  Component Value Date   HGBA1C 5.8 (H) 12/24/2019    Assessment & Plan:  1. Hypertensive heart disease with chronic systolic congestive heart failure (HCC) EF of 25% from 12/2019 He is euvolemic at this time Continue with Imdur, hydralazine, beta-blocker and spironolactone Followed by Cardiology  2. Screening for colon cancer - Fecal occult blood,  imunochemical([facility])    No orders of the defined types were placed in this encounter.   Follow-up: Return in about 3 months (around 07/28/2020) for Chronic disease management.       01/2020, MD, FAAFP. [facility] [place], [facility] [place]   04/29/2020, 5:20 PM

## 2020-05-11 ENCOUNTER — Ambulatory Visit (INDEPENDENT_AMBULATORY_CARE_PROVIDER_SITE_OTHER): Payer: Self-pay | Admitting: Pharmacist

## 2020-05-11 ENCOUNTER — Other Ambulatory Visit: Payer: Self-pay

## 2020-05-11 VITALS — BP 158/94 | HR 71 | Wt 278.2 lb

## 2020-05-11 DIAGNOSIS — I10 Essential (primary) hypertension: Secondary | ICD-10-CM

## 2020-05-11 DIAGNOSIS — I5043 Acute on chronic combined systolic (congestive) and diastolic (congestive) heart failure: Secondary | ICD-10-CM

## 2020-05-11 MED ORDER — LOSARTAN POTASSIUM 100 MG PO TABS: 100.0000 mg | ORAL_TABLET | Freq: Every day | ORAL | 3 refills | Status: DC

## 2020-05-11 MED ORDER — ISOSORBIDE MONONITRATE ER 30 MG PO TB24
30.0000 mg | ORAL_TABLET | Freq: Every day | ORAL | 3 refills | Status: DC
  Filled 2021-02-17: qty 30, 30d supply, fill #0

## 2020-05-11 MED ORDER — CARVEDILOL 25 MG PO TABS
37.5000 mg | ORAL_TABLET | Freq: Two times a day (BID) | ORAL | 3 refills | Status: DC
  Filled 2021-02-17: qty 90, 30d supply, fill #0

## 2020-05-11 MED ORDER — FAMOTIDINE 10 MG PO TABS
10.0000 mg | ORAL_TABLET | Freq: Two times a day (BID) | ORAL | 3 refills | Status: DC
Start: 2020-05-11 — End: 2020-12-29

## 2020-05-11 NOTE — Progress Notes (Signed)
Patient ID: Ronald Young                 DOB: 04/27/1971                      MRN: [id number]     HPI: Ronald Young is a 50 y.o. male referred by Dr. Bjorn Pippin to pharmacy clinic. PMH is significant for NICM, mild bilateral carotid artery disease, HTN, and cocaine use. ECHO on 12/21/19 showed LVEF <20% with global hypokinesis, moderate LVH, grade 3 diastolic dysfunction, severe RV dysfunction, mild MR, and mild dilatation of ascending aorta.  He was seen by Dr. Bjorn Pippin on 04/06/20. At this time, he was doing well and denied any lightheadedness, syncope, LEE, or palpitations. He was started on isosorbide mononitrate and [employer] application for [name] patient assistance was submitted.  He was last seen at pharmacy clinic on 04/20/20. During visit, he was euvolemic and was tolerating his medications well. He did report a mild, dull headache 1-2 times weekly but did not bother him. At visit, pt still had not heard back from [employer] patient assistance for [place] and [place] application. During visit, BP elevated and not at goal so losartan was increased to 100 mg daily and hydralazine increased to 50 mg TID.   Pt presents today in good spirits. He reports that his BP has been good with readings at home in the 130's/mid 80's. He did not bring home BP cuff or BP log to visit. He reports that he checks his BP every 3 days. He reports taking his medicine daily and is unsure of their name but recognizes them by color. He denies any headaches and dizziness with any medicines. Denies any swelling. Reports using an OTC Vick's nasal spray. Denies NSAID or other decongestant use. Pt wishes that all medications be sent to [facility] on [address].   Pt reports that he was denied Medicaid and is unsure why. He currently has no other plans for alternative insurance. I called [employer] to enquire about Entresto patient assistance, [employer] has received the application and it's under review. Decision will be made in  the next upcoming weeks.   Current HF meds: losartan 100 mg daily, carvedilol 37.5 mg BID (10am/6pm), spironolactone 25 mg daily (10am), hydralazine 50 mg TID (10am/6pm/10pm), isosorbide mononitrate 30 mg daily  Previously tried: lisinopril 10 mg daily, HCTZ 12.5 mg daily, benazepril 10 mg daily, amlodipine 10 mg daily  BP goal: <130/80 mmHg  Family History: father 74 - not familiar with his history, mother 82 with hypertension; no siblings, 74 twins, one with hypertension  Social History: no alcohol, no tobacco, no regular caffeine - coke once every week or two  Diet: mostly home cooked, no added salt, uses Ms. Dash; no pork, mostly chicken, does air fry; plenty of vegetables, some canned (rinses and cooks in water)  Exercise: walks 3 times per week - decreased now due to weather change  Home BP readings: 130/80s (pt-reported, did not bring log)  Labs: 03/19/20: Scr 1.14, K 4.7, Na 138 04/06/20: Scr 1.28, K 4.5, Na 141, BNP 122.5  Wt Readings from Last 3 Encounters:  04/29/20 278 lb (126.1 kg)  04/20/20 279 lb 12.8 oz (126.9 kg)  04/06/20 278 lb (126.1 kg)   BP Readings from Last 3 Encounters:  04/29/20 137/77  04/20/20 (!) 148/86  04/06/20 138/77   Pulse Readings from Last 3 Encounters:  04/29/20 66  04/20/20 63  04/06/20 60    Renal function: CrCl cannot  be calculated (Patient's most recent lab result is older than the maximum 21 days allowed.).  Past Medical History:  Diagnosis Date  . Arthritis   . Cardiomyopathy (HCC) 09/22/2014  . Hypertension     Current Outpatient Medications on File Prior to Visit  Medication Sig Dispense Refill  . acetaminophen-codeine (TYLENOL #3) 300-30 MG tablet Take 1 tablet by mouth every 4 (four) hours as needed for moderate pain or severe pain.    . carvedilol (COREG) 25 MG tablet Take 1.5 tablets (37.5 mg total) by mouth 2 (two) times daily with a meal. 270 tablet 3  . famotidine (PEPCID) 10 MG tablet Take 1 tablet (10 mg total) by  mouth 2 (two) times daily. (Patient not taking: Reported on 04/29/2020) 60 tablet 3  . hydrALAZINE (APRESOLINE) 50 MG tablet Take 1 tablet (50 mg total) by mouth 3 (three) times daily. 90 tablet 11  . isosorbide mononitrate (IMDUR) 30 MG 24 hr tablet Take 1 tablet (30 mg total) by mouth daily. 90 tablet 3  . losartan (COZAAR) 100 MG tablet Take 1 tablet (100 mg total) by mouth daily. 90 tablet 3  . Multiple Vitamins-Minerals (MULTIVITAMIN WITH MINERALS) tablet Take 1 tablet by mouth daily.    Marland Kitchen spironolactone (ALDACTONE) 25 MG tablet Take 1 tablet (25 mg total) by mouth daily. 30 tablet 2   No current facility-administered medications on file prior to visit.    Allergies  Allergen Reactions  . Strawberry Extract Hives     Assessment/Plan:  1. Chronic combined systolic and diastolic heart failure - Clinic BP 158/94 mmHg above goal < 130/80 mmHg. Per patient, higher than readings seen at home. Continue losartan 100 mg daily, carvedilol 37.5 mg BID, hydralazine 50 mg TID, spironolactone 25 mg daily, isosorbide mononitrate 30 mg daily. Will collect BMET today. Depending on lab result, will either increase spironolactone 25 mg to 50 mg daily or increase hydralazine 50 mg TID to 100 mg TID. Will f/u with patient via phone call tomorrow once lab results to discuss therapy options. Medications sent to [facility] on [address] per pt request.    Currently waiting on manufacturer assistance program response for [name]. If approved, would start Entresto 49/51 mg BID and stop losartan 100 mg daily with no need for washout period. Also submitted patient assistance program for Jardiance 10mg  daily. Ideally would have patient on Entresto, beta-blocker, SGLT-2i, and spironolactone for optimal GDMT heart failure therapy.   Pt has follow up with Dr in 1 month as well as echo. May need to push back echo since pt will not likely receive Entresto and Jardiance for the next few weeks.  Bjorn Pippin,  PharmD Candidate   Megan E. Supple, PharmD, BCACP, CPP [facility] [address], [place], [place] Phone: [phone]; Fax: [phone] 05/11/2020 4:00 PM

## 2020-05-11 NOTE — Patient Instructions (Addendum)
It was a pleasure to meet you today!  Continue taking all your medications.  We will check your labs today and will call you to discuss - we may increase the dose of either your hydralazine or spironolactone  We are still waiting to hear back from [employer] for your patient assistance for Entresto. This will end up replacing your losartan once you're approved. Their phone # is [phone]  We will submit patient assistance for Jardiance as well  Follow up with Medicaid to either re apply or see why you were denied # [phone]

## 2020-05-12 ENCOUNTER — Telehealth: Payer: Self-pay

## 2020-05-12 LAB — BASIC METABOLIC PANEL
BUN/Creatinine Ratio: 13 (ref 9–20)
BUN: 20 mg/dL (ref 6–24)
CO2: 23 mmol/L (ref 20–29)
Calcium: 9.7 mg/dL (ref 8.7–10.2)
Chloride: 105 mmol/L (ref 96–106)
Creatinine, Ser: 1.51 mg/dL — ABNORMAL HIGH (ref 0.76–1.27)
GFR calc Af Amer: 62 mL/min/{1.73_m2} (ref >59)
GFR calc non Af Amer: 54 mL/min/{1.73_m2} — ABNORMAL LOW (ref >59)
Glucose: 93 mg/dL (ref 65–99)
Potassium: 4.3 mmol/L (ref 3.5–5.2)
Sodium: 140 mmol/L (ref 134–144)

## 2020-05-12 MED ORDER — HYDRALAZINE HCL 50 MG PO TABS
100.0000 mg | ORAL_TABLET | Freq: Three times a day (TID) | ORAL | 3 refills | Status: DC
  Filled 2021-02-17: qty 180, 30d supply, fill #0

## 2020-05-12 MED ORDER — LOSARTAN POTASSIUM 50 MG PO TABS: 50.0000 mg | ORAL_TABLET | Freq: Every day | ORAL | 3 refills | Status: DC

## 2020-05-12 MED ORDER — HYDRALAZINE HCL 50 MG PO TABS: 100.0000 mg | ORAL_TABLET | Freq: Three times a day (TID) | ORAL | 3 refills | Status: DC

## 2020-05-12 NOTE — Telephone Encounter (Addendum)
BMET lab resulted. Scr increased from 1.28 mg/dL (51/8/34) to 3.73 mg/dL (5/78/9784) after losartan dose increase from 75 mg daily to 100 mg daily. SCr also previously rose after prior losartan dose increase to 100mg  back in September 2021 and resolved with subsequent dose reduction.  Since SCr has now risen twice when losartan dose has been increased to 100mg  in attempts to optimize his CHF regimen, will decrease back to 50 mg daily. Of note, we are still waiting for Entresto pt assistance to be approved. Will increase hydralazine 50 mg TID to 100 mg TID for further BP control. Pt uninsured and prescription for hydralazine 50 mg - 2 tabs TID is cheaper than hydralazine 100 mg - 1 tab TID.   Called pt and left message to discuss plan. Will also schedule follow up with PharmD in 2 weeks to recheck BP and BMET.

## 2020-05-12 NOTE — Telephone Encounter (Signed)
Pt returned call to clinic. He verbalized understanding of plan. Scheduled f/u in 2 weeks to recheck BP and kidney function.

## 2020-05-12 NOTE — Telephone Encounter (Signed)
Thanks Megan!

## 2020-05-12 NOTE — Telephone Encounter (Signed)
Patient called back to verify medication changes.  Reiterated losartan was decreased to 50mg  once daily and hydralazine was increased to 100mg  TID.  Patient has 25 mg tablets so this will be 4 tablets three times daily.  Patient voiced understanding.

## 2020-05-20 ENCOUNTER — Telehealth: Payer: Self-pay | Admitting: *Deleted

## 2020-05-20 ENCOUNTER — Telehealth: Payer: Self-pay | Admitting: Pharmacist

## 2020-05-20 NOTE — Telephone Encounter (Signed)
Called pt to make him aware of Entresto approval. Provided him with # to coordinate shipments [phone]. He is aware Jardiance pt assistance was denied. He will keep follow up appt with me on 1/25, hopefully will have his supply of Entresto then so that we can change him from losartan to [facility].

## 2020-05-20 NOTE — Telephone Encounter (Signed)
Received denial letter from [facility] for Jardiance due to income being too high. Will discuss at next visit on 1/25.  Entresto pt application still in process pending income verification. Confirmed that pt does not need to send in any documentation.

## 2020-05-20 NOTE — Telephone Encounter (Signed)
Received fax from [employer]-patient approved for Entresto patient assistance until 05/20/2021.   Patient ID: [id number]

## 2020-05-25 NOTE — Progress Notes (Signed)
Patient ID: [name]                 DOB: 09-01-1970                      MRN: [id number]     HPI: Ronald Young is a 50 y.o. male referred by Ronald Young to pharmacy clinic. PMH is significant for NICM, mild bilateral carotid artery disease, HTN, and cocaine use. ECHO on 12/21/19 showed LVEF <20% with global hypokinesis, moderate LVH, grade 3 diastolic dysfunction, severe RV dysfunction, mild MR, and mild dilatation of ascending aorta. He was seen by Ronald Young on 04/06/20. At this time, he was doing well and denied any lightheadedness, syncope, LEE, or palpitations. He was started on isosorbide mononitrate and [employer] application for [name] patient assistance was submitted.  He was then seen at pharmacy clinic on 04/20/20. He was  tolerating medicines well with a mild, dull HA 1-2 times/week that was not bothersome. Pt had not heard back regarding [employer] PAP for [name]. Losartan increased to 100 mg daily and hydralazine increased to 50 mg TID.  He was last seen in pharmacy clinic on 05/11/2020. He reported improved BP with readings in the 130's/mid 80's. He reported being denied by Medicaid and was still waiting to hear from [employer] PAP for Entresto. BMET was ordered and collected during visit. Due to second time of Scr increasing secondary to losartan dose change, losartan was decreased from 100 mg daily to 50 mg daily and hydralazine increased from 50 mg TID to 100 mg TID for further BP control. On 05/20/2020, pt received approval for Entresto PAP and denial from [employer] PAP.   Today, pt presents to clinic in good spirits. He reports no new side effects since increasing hydralazine from 50 mg TID to 100 mg TID. He denies any dizziness or blurry vision. He does endorse some mild headaches from time to time but not often. If he does get a headache, it resolves with Tylenol. He reports not being able to be in touch with [employer] to coordinate [place] pick-up. He also has not been able to  get in touch with his Medicaid case worker to inquire about Medicaid application. Since patient was denied [employer] patient assistance, will attempt to file for Farxiga patient assistance instead. BP in clinic today 150/96 first, then 148/98 mmHg on recheck.   Current HF meds:  losartan 50 mg daily carvedilol 37.5 mg BID (10am/6pm)  spironolactone 25 mg daily (10am) hydralazine 100 mg TID (10am/6pm/10pm) isosorbide mononitrate 30 mg daily  Previously tried:  lisinopril 10 mg daily  HCTZ 12.5 mg daily  benazepril 10 mg daily amlodipine 10 mg daily  BP goal: <130/80 mmHg  Family History: father 73 - not familiar with his history, mother 62 with hypertension; no siblings, 64 twins, one with hypertension  Social History: no alcohol, no tobacco, no regular caffeine - coke once every week or two; marijuana use 3-4x/week, denies other illicit substance use  Diet: mostly home cooked, no added salt, uses Ms. Dash; no pork, mostly chicken, does air fry; plenty of vegetables, some canned (rinses and cooks in water)  Exercise: walks 3 times per week - decreased now due to weather change; not doing anything trying to do one walk   Home BP readings: not currently checking at home  Labs: 03/19/20: Scr 1.14, K 4.7, Na 138 04/06/20: Scr 1.28, K 4.5, Na 141, BNP 122.5 05/11/20: Scr 1.51, K 4.3, Na 140 - (losartan inc  50 -> 100 mg)  Wt Readings from Last 3 Encounters:  05/11/20 278 lb 3.2 oz (126.2 kg)  04/29/20 278 lb (126.1 kg)  04/20/20 279 lb 12.8 oz (126.9 kg)   BP Readings from Last 3 Encounters:  05/11/20 (!) 158/94  04/29/20 137/77  04/20/20 (!) 148/86   Pulse Readings from Last 3 Encounters:  05/11/20 71  04/29/20 66  04/20/20 63    Renal function: Estimated Creatinine Clearance: 82.1 mL/min (A) (by C-G formula based on SCr of 1.51 mg/dL (H)).  Past Medical History:  Diagnosis Date  . Arthritis   . Cardiomyopathy (HCC) 09/22/2014  . Hypertension     Current Outpatient  Medications on File Prior to Visit  Medication Sig Dispense Refill  . acetaminophen-codeine (TYLENOL #3) 300-30 MG tablet Take 1 tablet by mouth every 4 (four) hours as needed for moderate pain or severe pain.    . carvedilol (COREG) 25 MG tablet Take 1.5 tablets (37.5 mg total) by mouth 2 (two) times daily with a meal. 270 tablet 3  . famotidine (PEPCID) 10 MG tablet Take 1 tablet (10 mg total) by mouth 2 (two) times daily. 60 tablet 3  . hydrALAZINE (APRESOLINE) 50 MG tablet Take 2 tablets (100 mg total) by mouth 3 (three) times daily. 540 tablet 3  . isosorbide mononitrate (IMDUR) 30 MG 24 hr tablet Take 1 tablet (30 mg total) by mouth daily. 90 tablet 3  . losartan (COZAAR) 50 MG tablet Take 1 tablet (50 mg total) by mouth daily. 90 tablet 3  . Multiple Vitamins-Minerals (MULTIVITAMIN WITH MINERALS) tablet Take 1 tablet by mouth daily.    Marland Kitchen spironolactone (ALDACTONE) 25 MG tablet Take 1 tablet (25 mg total) by mouth daily. 30 tablet 2   No current facility-administered medications on file prior to visit.    Allergies  Allergen Reactions  . Strawberry Extract Hives     Assessment/Plan:  1. Chronic combined systolic and diastolic heart failure/HTN -  BP in clinic 148/98 mmHg elevated and above goal < 130/80 mmHg. Pt with optimal medication adherence. BMET ordered and collected today. Will continue current medications and make medication changes pending BMET results. Will f/u with pt via phone call tomorrow to discuss results. Likely will be increasing spironolactone. Will defer on increasing losartan secondary to Scr bump after previous losartan dose change and Entresto switch the in coming weeks once pt receives his shipment. Encouraged patient to check home BP at least 3x week. Encouraged patient to try walking at least once a week and increase activity as tolerated.   Pt would benefit from SGLT-2i therapy for HF. Pt was denied for [employer] patient assistance. Filled out Farxiga 10 mg  daily patient assistance form with patient. Faxed over to [facility] office for Dr. Ida Young signature to finish completion.    Ronald Young, PharmD Candidate   Ronald Young, PharmD, BCACP, CPP [facility] [address], [place], [place] [address] Phone: [phone]; Fax: [phone] 05/26/2020 3:55 PM

## 2020-05-26 ENCOUNTER — Ambulatory Visit (INDEPENDENT_AMBULATORY_CARE_PROVIDER_SITE_OTHER): Payer: Self-pay | Admitting: Pharmacist

## 2020-05-26 ENCOUNTER — Other Ambulatory Visit: Payer: Self-pay

## 2020-05-26 ENCOUNTER — Telehealth: Payer: Self-pay | Admitting: Cardiology

## 2020-05-26 VITALS — BP 148/98 | HR 64

## 2020-05-26 DIAGNOSIS — I5043 Acute on chronic combined systolic (congestive) and diastolic (congestive) heart failure: Secondary | ICD-10-CM

## 2020-05-26 NOTE — Patient Instructions (Addendum)
It was great to see you today!   Your blood pressure today was 148/98 mmHg. Your blood pressure goal is less than 130/80 mmHg.   We will check your labs today to look at your electrolytes and kidney function. We will call you tomorrow with results and any medication changes. Keep taking all your medicines in the meantime.  Try to check your blood pressure at least 3 times a week and keep a log of it.   Try to walk for 20 minutes at least once a week.   Call [employer] again for your [place] shipment # is [phone]  We will submit patient assistance for [name] as well  Follow up with Medicaid to either re-apply or see why you were denied # [phone]  Give [place] a call with any questions or concerns. [phone]

## 2020-05-26 NOTE — Telephone Encounter (Signed)
Called and spoke with pt's mom to gather household income information for [place] patient assistance application.   Sula Soda, PharmD Candidate

## 2020-05-26 NOTE — Telephone Encounter (Signed)
Spoke with patient. Unsure who called as no phone note. He had visit with clinical pharmacist today. Will send to her, to advise if she called.

## 2020-05-26 NOTE — Telephone Encounter (Signed)
New message:     Patient mother calling stating that some one called, but I did not see a note and patient must left.

## 2020-05-27 ENCOUNTER — Telehealth: Payer: Self-pay

## 2020-05-27 LAB — BASIC METABOLIC PANEL
BUN/Creatinine Ratio: 16 (ref 9–20)
BUN: 22 mg/dL (ref 6–24)
CO2: 23 mmol/L (ref 20–29)
Calcium: 10.1 mg/dL (ref 8.7–10.2)
Chloride: 103 mmol/L (ref 96–106)
Creatinine, Ser: 1.41 mg/dL — ABNORMAL HIGH (ref 0.76–1.27)
GFR calc Af Amer: 68 mL/min/{1.73_m2} (ref >59)
GFR calc non Af Amer: 58 mL/min/{1.73_m2} — ABNORMAL LOW (ref >59)
Glucose: 100 mg/dL — ABNORMAL HIGH (ref 65–99)
Potassium: 5 mmol/L (ref 3.5–5.2)
Sodium: 142 mmol/L (ref 134–144)

## 2020-05-27 NOTE — Telephone Encounter (Signed)
Agree with assessment. Will reach out to pt again in another 1-2 weeks to see if he has called about his Entresto shipments yet.

## 2020-05-27 NOTE — Telephone Encounter (Signed)
Called pt and discussed BMET results. BMET resulted and stable. However, K increased from 4.3 to 5.0. Will defer on increasing spironolactone at this time d/t K on higher end of normal. Additionally, pt switching to [place] soon. Once initiated, will f/u with BMET to re-check kidney function and electrolytes. Asked pt if he had contacted [employer] to coordinate free shipment. Pt denied calling. Will also be waiting for decision regarding Farxiga patient assistance.   Sula Soda, PharmD Candidate

## 2020-06-02 NOTE — Telephone Encounter (Signed)
MD signature received, [name] pt assistance form has been faxed.

## 2020-06-08 ENCOUNTER — Ambulatory Visit (HOSPITAL_COMMUNITY): Payer: Self-pay | Attending: Cardiovascular Disease

## 2020-06-08 ENCOUNTER — Other Ambulatory Visit: Payer: Self-pay

## 2020-06-08 DIAGNOSIS — I5042 Chronic combined systolic (congestive) and diastolic (congestive) heart failure: Secondary | ICD-10-CM | POA: Insufficient documentation

## 2020-06-08 LAB — ECHOCARDIOGRAM COMPLETE
AR max vel: 3.31 cm2
AV Area VTI: 3.15 cm2
AV Area mean vel: 2.78 cm2
AV Mean grad: 7 mmHg
AV Peak grad: 11.8 mmHg
Ao pk vel: 1.72 m/s
Area-P 1/2: 3.12 cm2
S' Lateral: 3.9 cm

## 2020-06-10 ENCOUNTER — Telehealth: Payer: Self-pay | Admitting: Pharmacist

## 2020-06-10 NOTE — Telephone Encounter (Signed)
Called pt and left message. Need to see if he reached out to [employer] for [employer]. Once he has, will change losartan to [facility] and will need f/u labs scheduled.  Still awaiting decision on Farxiga assistance.

## 2020-06-11 NOTE — Telephone Encounter (Signed)
Pt returned call to clinic. He did receive his Entresto in the mail, has not started taking it yet. His last dose of losartan 50mg  was this morning. Advised pt to start taking Entresto 49-51mg  1 tablet twice daily with first dose tomorrow morning ~24 hours after last losartan dose. He is aware to monitor his BP.  Forwarding to Dr as an Bjorn Pippin as pt has follow up appt next week on 2/14. Will need BMET rechecked within 1-2 weeks of starting Entresto.  Still awaiting 3/14 pt assistance decision from manufacturer.

## 2020-06-11 NOTE — Telephone Encounter (Signed)
Thanks Megan!

## 2020-06-14 NOTE — Progress Notes (Signed)
Cardiology Office Note:    Date:  06/15/2020   ID:  Ronald Young, DOB 1971-01-31, MRN [id number]  PCP:  Patient, No Pcp Per  Cardiologist:  Little Ishikawa, MD  Electrophysiologist:  None   Referring MD: No ref. provider found   Chief Complaint  Patient presents with  . Congestive Heart Failure    History of Present Illness:    Ronald Young is a 50 y.o. male with a hx of nonischemic cardiomyopathy, mild bilateral carotid artery disease, hypertension, cocaine use who presents for follow-up.  He was admitted with decompensated heart failure and hypertensive urgency in August 2021, had been off his medications for years.  Echocardiogram 12/21/2019 showed LVEF less than 20% with global hypokinesis, moderate LVH, grade 3 diastolic dysfunction, severe RV dysfunction, mild MR, mild dilatation of the ascending aorta measuring 40 mm.  RHC/LHC on 12/23/2019 showed normal coronary arteries and filling pressures.  He was discharged on losartan 50 mg daily, carvedilol 25 mg twice daily, spironolactone 25 mg daily.  He was discharged on as needed Lasix, advised to monitor daily weights.     Repeat echocardiogram on 06/08/2020 showed significant improvement in LV systolic function (EF 45 to 50%), moderate LVH, grade 2 diastolic dysfunction, normal RV function, no significant valvular disease.  Since last clinic visit, he reports that he has been doing well.  Denies any chest pain, dyspnea, lightheadedness, syncope, lower extremity edema.  Does report palpitations about once per month, lasts few seconds.  Started entresto on Friday.    Wt Readings from Last 3 Encounters:  06/15/20 279 lb (126.6 kg)  05/11/20 278 lb 3.2 oz (126.2 kg)  04/29/20 278 lb (126.1 kg)   BP Readings from Last 3 Encounters:  06/15/20 (!) 130/91  05/26/20 (!) 148/98  05/11/20 (!) 158/94     Past Medical History:  Diagnosis Date  . Arthritis   . Cardiomyopathy (HCC) 09/22/2014  . Hypertension     Past Surgical  History:  Procedure Laterality Date  . RIGHT/LEFT HEART CATH AND CORONARY ANGIOGRAPHY N/A 12/23/2019   Procedure: RIGHT/LEFT HEART CATH AND CORONARY ANGIOGRAPHY;  Surgeon: Runell Gess, MD;  Location: [facility] INVASIVE CV LAB;  Service: Cardiovascular;  Laterality: N/A;    Current Medications: Current Meds  Medication Sig  . acetaminophen-codeine (TYLENOL #3) 300-30 MG tablet Take 1 tablet by mouth every 4 (four) hours as needed for moderate pain or severe pain.  . carvedilol (COREG) 25 MG tablet Take 1.5 tablets (37.5 mg total) by mouth 2 (two) times daily with a meal.  . famotidine (PEPCID) 10 MG tablet Take 1 tablet (10 mg total) by mouth 2 (two) times daily.  . hydrALAZINE (APRESOLINE) 50 MG tablet Take 2 tablets (100 mg total) by mouth 3 (three) times daily.  . isosorbide mononitrate (IMDUR) 30 MG 24 hr tablet Take 1 tablet (30 mg total) by mouth daily.  . Multiple Vitamins-Minerals (MULTIVITAMIN WITH MINERALS) tablet Take 1 tablet by mouth daily.  . sacubitril-valsartan (ENTRESTO) 49-51 MG Take 1 tablet by mouth 2 (two) times daily.  Marland Kitchen spironolactone (ALDACTONE) 25 MG tablet Take 1 tablet (25 mg total) by mouth daily.  . [DISCONTINUED] losartan (COZAAR) 50 MG tablet Take 1 tablet (50 mg total) by mouth daily.     Allergies:   Strawberry extract   Social History   Socioeconomic History  . Marital status: Single    Spouse name: Not on file  . Number of children: Not on file  . Years of education: Not on  file  . Highest education level: Not on file  Occupational History  . Not on file  Tobacco Use  . Smoking status: Former Smoker    Types: Cigarettes  . Smokeless tobacco: Never Used  Substance and Sexual Activity  . Alcohol use: Yes    Comment: "drank a couple beers last weekend"  . Drug use: Yes    Types: Marijuana    Comment: Last use 10/20/14.  Marland Kitchen Sexual activity: Not on file  Other Topics Concern  . Not on file  Social History Narrative  . Not on file   Social  Determinants of Health   Financial Resource Strain: Not on file  Food Insecurity: Not on file  Transportation Needs: Not on file  Physical Activity: Not on file  Stress: Not on file  Social Connections: Not on file     Family History: [name] family history includes Colon cancer in an other family member; Hypertension in his mother.  ROS:   Please see the history of present illness.     All other systems reviewed and are negative.  EKGs/Labs/Other Studies Reviewed:    The following studies were reviewed today:   EKG:  EKG is ordered today.  EKG demonstrates NSR with first degree AV block, rate 74, TWI in I, II, aVL, V4-6  Recent Labs: 12/20/2019: ALT 43 12/24/2019: Hemoglobin 11.1; Magnesium 2.2; Platelets 326 04/06/2020: BNP 122.5 05/26/2020: BUN 22; Creatinine, Ser 1.41; Potassium 5.0; Sodium 142  Recent Lipid Panel    Component Value Date/Time   CHOL 183 12/21/2019 0443   TRIG 67 12/21/2019 0443   HDL 28 (L) 12/21/2019 0443   CHOLHDL 6.5 12/21/2019 0443   VLDL 13 12/21/2019 0443   LDLCALC 142 (H) 12/21/2019 0443    Physical Exam:    VS:  BP (!) 130/91   Pulse 74   Ht 6' (1.829 m)   Wt 279 lb (126.6 kg)   SpO2 95%   BMI 37.84 kg/m     Wt Readings from Last 3 Encounters:  06/15/20 279 lb (126.6 kg)  05/11/20 278 lb 3.2 oz (126.2 kg)  04/29/20 278 lb (126.1 kg)     GEN: Well nourished, well developed in no acute distress HEENT: Normal NECK: No JVD; No carotid bruits CARDIAC: RRR, no murmurs, rubs, gallops RESPIRATORY:  Clear to auscultation without rales, wheezing or rhonchi  ABDOMEN: Soft, non-tender, non-distended MUSCULOSKELETAL:  No edema; No deformity  SKIN: Warm and dry NEUROLOGIC:  Alert and oriented x 3 PSYCHIATRIC:  Normal affect   ASSESSMENT:    1. Chronic combined systolic and diastolic heart failure (HCC)   2. Essential hypertension   3. Snoring    PLAN:    Chronic combined systolic and diastolic heart failure: Echocardiogram  12/21/2019 showed LVEF less than 20% with global hypokinesis, moderate LVH, grade 3 diastolic dysfunction, severe RV dysfunction, mild MR, mild dilatation of the ascending aorta measuring 40 mm.  RHC/LHC on 12/23/2019 showed normal coronary arteries and filling pressures.  Nonischemic cardiomyopathy, suspect due to uncontrolled hypertension.  Repeat echocardiogram on 06/08/2020 showed significant improvement in LV systolic function (EF 45 to 50%), moderate LVH, grade 2 diastolic dysfunction, normal RV function, no significant valvular disease. -Switched from losartan to Entresto 49-51 mg twice daily on 2/11.  Will check BMET after 1 week of use -Continue carvedilol 37.5 mg twice daily -Continue spironolactone 25 mg daily -Continue hydralazine 100 mg 3 times daily and Imdur 30 mg daily -As needed Lasix, currently appears euvolemic  -Ideally  would check cardiac MRI to evaluate etiology of nonischemic cardiomyopathy.  However holding off on further testing for now as he has applied for Medicaid and awaiting results.  Would also consider Zio patch to quantify PVC burden when able.  Hypertension: Continue entresto, carvedilol, spironolactone, hydralazine/Imdur as above.  Cocaine use: Denies recent use, UDS negative on recent admission.  Encouraged continued cessation  GERD: Continue famotidine  Snoring: Suspect undiagnosed OSA contributing to difficult to control hypertension.  He is in the process of applying for Medicaid, will plan sleep study once that is completed  RTC in 3 months  Medication Adjustments/Labs and Tests Ordered: Current medicines are reviewed at length with the patient today.  Concerns regarding medicines are outlined above.  Orders Placed This Encounter  Procedures  . Basic metabolic panel  . EKG 12-Lead   No orders of the defined types were placed in this encounter.   Patient Instructions  Medication Instructions:  Your physician recommends that you continue on your current  medications as directed. Please refer to the Current Medication list given to you today.  *If you need a refill on your cardiac medications before your next appointment, please call your pharmacy*   Lab Work: Please return for labs in Friday 2/18  (BMET)  Our in office lab hours are Monday-Friday 8:00-4:00, closed for lunch 12:45-1:45 pm.  No appointment needed.  Follow-Up: At [facility], you and your health needs are our priority.  As part of our continuing mission to provide you with exceptional heart care, we have created designated Provider Care Teams.  These Care Teams include your primary Cardiologist (physician) and Advanced Practice Providers (APPs -  Physician Assistants and Nurse Practitioners) who all work together to provide you with the care you need, when you need it.  We recommend signing up for the patient portal called "MyChart".  Sign up information is provided on this After Visit Summary.  MyChart is used to connect with patients for Virtual Visits (Telemedicine).  Patients are able to view lab/test results, encounter notes, upcoming appointments, etc.  Non-urgent messages can be sent to your provider as well.   To learn more about what you can do with MyChart, go to [email or web address].    Your next appointment:   2 week(s) with pharmacist 2 months with Dr. Bjorn Pippin       Signed, Little Ishikawa, MD  06/15/2020 5:29 PM    [facility]

## 2020-06-15 ENCOUNTER — Other Ambulatory Visit: Payer: Self-pay

## 2020-06-15 ENCOUNTER — Encounter: Payer: Self-pay | Admitting: Cardiology

## 2020-06-15 ENCOUNTER — Ambulatory Visit (INDEPENDENT_AMBULATORY_CARE_PROVIDER_SITE_OTHER): Payer: Self-pay | Admitting: Cardiology

## 2020-06-15 ENCOUNTER — Telehealth: Payer: Self-pay | Admitting: Licensed Clinical Social Worker

## 2020-06-15 VITALS — BP 130/91 | HR 74 | Ht 72.0 in | Wt 279.0 lb

## 2020-06-15 DIAGNOSIS — I5042 Chronic combined systolic (congestive) and diastolic (congestive) heart failure: Secondary | ICD-10-CM

## 2020-06-15 DIAGNOSIS — R0683 Snoring: Secondary | ICD-10-CM

## 2020-06-15 DIAGNOSIS — I10 Essential (primary) hypertension: Secondary | ICD-10-CM

## 2020-06-15 NOTE — Telephone Encounter (Signed)
Received referral that pt has not received any updates about his Medicaid/disability claims.  I will try and f/u with [employer] regarding pt applications. Pt also needs to contact DSS who may be able to provide updates. I will reach out to pt first thing on 2/15.   [name], MSW, LCSW [facility] Heart/Vascular Care Navigation  [phone]

## 2020-06-15 NOTE — Patient Instructions (Signed)
Medication Instructions:  Your physician recommends that you continue on your current medications as directed. Please refer to the Current Medication list given to you today.  *If you need a refill on your cardiac medications before your next appointment, please call your pharmacy*   Lab Work: Please return for labs in Friday 2/18  (BMET)  Our in office lab hours are Monday-Friday 8:00-4:00, closed for lunch 12:45-1:45 pm.  No appointment needed.  Follow-Up: At [facility], you and your health needs are our priority.  As part of our continuing mission to provide you with exceptional heart care, we have created designated Provider Care Teams.  These Care Teams include your primary Cardiologist (physician) and Advanced Practice Providers (APPs -  Physician Assistants and Nurse Practitioners) who all work together to provide you with the care you need, when you need it.  We recommend signing up for the patient portal called "MyChart".  Sign up information is provided on this After Visit Summary.  MyChart is used to connect with patients for Virtual Visits (Telemedicine).  Patients are able to view lab/test results, encounter notes, upcoming appointments, etc.  Non-urgent messages can be sent to your provider as well.   To learn more about what you can do with MyChart, go to [email or web address].    Your next appointment:   2 week(s) with pharmacist 2 months with Dr. Bjorn Pippin

## 2020-06-16 ENCOUNTER — Telehealth: Payer: Self-pay | Admitting: Licensed Clinical Social Worker

## 2020-06-16 ENCOUNTER — Telehealth: Payer: Self-pay | Admitting: Cardiology

## 2020-06-16 NOTE — Telephone Encounter (Signed)
With patient to schedule the 2 week Pharm D visit (at [facility]) and the 2-3 months follow up with Dr. Mortimer Fries D scheduled Monday 06/29/20 at 3:30 pm at [facility] and follow up with Dr. Bjorn Pippin scheduled 07/31/20 at 3:00pm---will mail information to patient ahd he voiced his understanding.

## 2020-06-16 NOTE — Telephone Encounter (Signed)
LCSW called pt this morning. Re-introduced self, role, reason for call. Pt aware he had been denied for Medicaid, he has a hearing for appeal on the 28th and is aware of who his caseworker is at this time. Other than that he shares he is doing okay and denies any other current concerns. I shared that he can reach out to me if he has any other concerns or questions in the meantime.   Ronald Young, MSW, LCSW [facility] Heart/Vascular Care Navigation  [phone]

## 2020-06-19 LAB — BASIC METABOLIC PANEL
BUN/Creatinine Ratio: 12 (ref 9–20)
BUN: 15 mg/dL (ref 6–24)
CO2: 24 mmol/L (ref 20–29)
Calcium: 9.3 mg/dL (ref 8.7–10.2)
Chloride: 104 mmol/L (ref 96–106)
Creatinine, Ser: 1.21 mg/dL (ref 0.76–1.27)
GFR calc Af Amer: 81 mL/min/{1.73_m2} (ref >59)
GFR calc non Af Amer: 70 mL/min/{1.73_m2} (ref >59)
Glucose: 102 mg/dL — ABNORMAL HIGH (ref 65–99)
Potassium: 4.4 mmol/L (ref 3.5–5.2)
Sodium: 142 mmol/L (ref 134–144)

## 2020-06-24 ENCOUNTER — Telehealth: Payer: Self-pay | Admitting: Pharmacist

## 2020-06-24 ENCOUNTER — Other Ambulatory Visit: Payer: Self-pay

## 2020-06-24 MED ORDER — SPIRONOLACTONE 25 MG PO TABS: 25.0000 mg | ORAL_TABLET | Freq: Every day | ORAL | 10 refills | Status: DC

## 2020-06-24 NOTE — Telephone Encounter (Signed)
This is Dr. Schumann's pt 

## 2020-06-24 NOTE — Telephone Encounter (Addendum)
Called [employer] for update on Farxiga pt assistance application that was faxed on 06/01/20. They had not started processing his application yet and also entered his phone number incorrectly. They processed application while I was on the phone, approved today through 06/23/21. 45 minutes spent on the phone with them.  Called pt who is aware to be on the lookout for a call regarding Farxiga delivery. Moved out next appt with PharmD to 2 weeks after he receives [place]. He is aware of med plan and will call with any concerns before then.

## 2020-06-29 ENCOUNTER — Ambulatory Visit: Payer: Self-pay

## 2020-06-30 ENCOUNTER — Ambulatory Visit: Payer: Self-pay | Admitting: Cardiology

## 2020-06-30 ENCOUNTER — Encounter: Payer: Self-pay | Admitting: *Deleted

## 2020-07-07 ENCOUNTER — Ambulatory Visit: Payer: Self-pay

## 2020-07-13 ENCOUNTER — Ambulatory Visit: Payer: Self-pay | Admitting: Pharmacist

## 2020-07-13 NOTE — Progress Notes (Unsigned)
Patient ID: Ronald Young                 DOB: [birth year]                      MRN: [id number]     HPI: Ronald Young is a 50 y.o. male referred by Dr. Bjorn Pippin to pharmacy clinic. PMH is significant for NICM, mild bilateral carotid artery disease, HTN, and cocaine use. He was admitted with decompensated heart failure and hypertensive urgency in August 2021 after having been off his medications for years. ECHO on 12/21/19 showed LVEF <20% with global hypokinesis, moderate LVH, grade 3 diastolic dysfunction, severe RV dysfunction, mild MR, and mild dilatation of ascending aorta. He was seen by Dr. Bjorn Pippin on 04/06/20. At this time, he was doing well and denied any lightheadedness, syncope, LEE, or palpitations. He was started on isosorbide mononitrate and [employer] application for [name] patient assistance was submitted.  He was then seen at pharmacy clinic on 04/20/20. He was tolerating medicines well with a mild, dull HA 1-2 times/week that was not bothersome. Pt had not heard back regarding [employer] PAP for [name]. Losartan increased to 100 mg daily and hydralazine increased to 50 mg TID. At next visit with pharmacy clinic on 05/11/2020, he reported improved BP with readings in the 130's/mid 80's. Was denied by Medicaid and still waiting to hear from [employer] PAP for Entresto. BMET was ordered and collected during visit. Due to second time of Scr increasing secondary to losartan dose change, losartan was decreased from 100 mg daily to 50 mg daily and hydralazine increased from 50 mg TID to 100 mg TID for further BP control. On 05/20/2020, pt received approval for Entresto PAP and denial from [employer] PAP.   At last pharmacy visit on 05/26/20, pt reported no new side effects since increasing hydralazine from 50 mg TID to 100 mg TID. Did endorse some mild headaches which resolve with Tylenol. BP was still elevated to 148/98. On BMET, K increased to 5.0 so holding off on further spironolactone increases.  Since that visit, pt has now received Entresto from the manufacturer, and has switched losartan to [name], started on 2/11.   Repeat ECHO 06/08/20 showed significant improvement in LV systolic function (EF 45 to 50%), moderate LVH, grade 2 diastolic dysfunction, normal RV function, no significant valvular disease. Last seen by Dr. Bjorn Pippin on 2/14, BP was 130/91 and current medications were continued. Scr was stable 1 week after starting Entresto, K 4.4. Since that visit, patient assistance has now been approved for [place] and to start taking once he received it. Pt had a hearing to appeal Medicaid denial scheduled for 2/28.   Today, ***  -started farxiga/when? Tolerating entresto, farxiga?  -checking bp/weight at home? changes in weight?  -updates on medicaid appeal?  -dizziness, headaches, blurred vision?  -swelling? -any changes in diet/exercise? Plan: bmet, incr entresto?    Current HF meds:  Entresto 49/51 mg BID Carvedilol 37.5 mg BID (10am/6pm)  Spironolactone 25 mg daily (10am) Hydralazine 100 mg TID (10am/6pm/10pm) Isosorbide mononitrate 30 mg daily  Previously tried: Lisinopril 10 mg daily HCTZ 12.5 mg daily Benazepril 10 mg daily Amlodipine 10 mg daily Losartan 50 mg daily  BP goal: <130/80 mmHg  Family History: Father 4 - not familiar with his history, mother 20 with hypertension; no siblings, 62 twins, one with hypertension  Social History: No alcohol, no tobacco, no regular caffeine - coke once every week or two; marijuana use 3-4x/week,  denies other illicit substance use  Diet: Mostly home cooked, no added salt, uses Ms. Dash; no pork, mostly chicken, does air fry; plenty of vegetables, some canned (rinses and cooks in water)  Exercise: Walks 3 times per week - decreased now due to weather change; not doing anything trying to do one walk   Home BP readings: Not currently checking at home  Labs: 03/19/20: Scr 1.14, K 4.7, Na 138 04/06/20: Scr 1.28, K 4.5, Na  141, BNP 122.5 05/11/20: Scr 1.51, K 4.3, Na 140 - (losartan inc 50 -> 100 mg) 05/26/20: Scr 1.41, K 5.0, Na 142 (losartan 50 mg) 06/19/20: Scr 1.21, K 4.4, Na 142 (losartan > Entresto 49/51 mg BID)  Wt Readings from Last 3 Encounters:  06/15/20 279 lb (126.6 kg)  05/11/20 278 lb 3.2 oz (126.2 kg)  04/29/20 278 lb (126.1 kg)   BP Readings from Last 3 Encounters:  06/15/20 (!) 130/91  05/26/20 (!) 148/98  05/11/20 (!) 158/94   Pulse Readings from Last 3 Encounters:  06/15/20 74  05/26/20 64  05/11/20 71    Renal function: CrCl cannot be calculated (Patient's most recent lab result is older than the maximum 21 days allowed.).  Past Medical History:  Diagnosis Date  . Arthritis   . Cardiomyopathy (HCC) 09/22/2014  . Hypertension     Current Outpatient Medications on File Prior to Visit  Medication Sig Dispense Refill  . acetaminophen-codeine (TYLENOL #3) 300-30 MG tablet Take 1 tablet by mouth every 4 (four) hours as needed for moderate pain or severe pain.    . carvedilol (COREG) 25 MG tablet Take 1.5 tablets (37.5 mg total) by mouth 2 (two) times daily with a meal. 270 tablet 3  . dapagliflozin propanediol (FARXIGA) 10 MG TABS tablet Take 10 mg by mouth daily.    . famotidine (PEPCID) 10 MG tablet Take 1 tablet (10 mg total) by mouth 2 (two) times daily. 60 tablet 3  . hydrALAZINE (APRESOLINE) 50 MG tablet Take 2 tablets (100 mg total) by mouth 3 (three) times daily. 540 tablet 3  . isosorbide mononitrate (IMDUR) 30 MG 24 hr tablet Take 1 tablet (30 mg total) by mouth daily. 90 tablet 3  . Multiple Vitamins-Minerals (MULTIVITAMIN WITH MINERALS) tablet Take 1 tablet by mouth daily.    . sacubitril-valsartan (ENTRESTO) 49-51 MG Take 1 tablet by mouth 2 (two) times daily.    Marland Kitchen spironolactone (ALDACTONE) 25 MG tablet Take 1 tablet (25 mg total) by mouth daily. 30 tablet 10   No current facility-administered medications on file prior to visit.    Allergies  Allergen Reactions  .  Strawberry Extract Hives     Assessment/Plan:  1. Chronic combined systolic and diastolic heart failure/HTN -  BP in clinic *** elevated and above goal <130/80 mmHg.

## 2020-07-20 ENCOUNTER — Ambulatory Visit: Payer: Self-pay

## 2020-07-27 NOTE — Progress Notes (Signed)
Patient ID: Ronald Young                 DOB: Jul 11, 1970                      MRN: [id number]     HPI: Ronald Young is a 50 y.o. male referred by Dr. Bjorn Pippin to pharmacy clinic. PMH is significant for NICM, mild bilateral carotid artery disease, HTN, and cocaine use. He was admitted with decompensated heart failure and hypertensive urgency in August 2021 after having been off his medications for years. ECHO on 12/21/19 showed LVEF <20% with global hypokinesis, moderate LVH, grade 3 diastolic dysfunction, severe RV dysfunction, mild MR, and mild dilatation of ascending aorta. He was seen by Dr. Bjorn Pippin on 04/06/20. At this time, he was doing well and denied any lightheadedness, syncope, LEE, or palpitations. He was started on isosorbide mononitrate and [employer] application for Ronald Young patient assistance was submitted.  He was then seen at pharmacy clinic on 04/20/20. He was tolerating medicines well with a mild, dull HA 1-2 times/week that was not bothersome. Pt had not heard back regarding [employer] PAP for Ronald Young. Losartan increased to 100 mg daily and hydralazine increased to 50 mg TID. At next visit with pharmacy clinic on 05/11/2020, he reported improved BP with readings in the 130's/mid 80's. Was denied by Medicaid and still waiting to hear from [employer] PAP for Entresto. BMET was ordered and collected during visit. Due to second time of Scr increasing secondary to losartan dose change, losartan was decreased from 100 mg daily to 50 mg daily and hydralazine increased from 50 mg TID to 100 mg TID for further BP control. On 05/20/2020, pt received approval for Entresto PAP and denial from [employer] P[employer].   At last pharmacy visit on 05/26/20, pt reported no new side effects since increasing hydralazine from 50 mg TID to 100 mg TID. Did endorse some mild headaches which resolve with Tylenol. BP was still elevated to 148/98. On BMET, K increased to 5.0 so holding off on further spironolactone increases.  Since that visit, pt has now received Entresto from the manufacturer, and has switched losartan to [place], started on 2/11.   Repeat ECHO 06/08/20 showed significant improvement in LV systolic function (EF 45 to 50%), moderate LVH, grade 2 diastolic dysfunction, normal RV function, no significant valvular disease. Last seen by Dr. Bjorn Pippin on 2/14, BP was 130/91 and current medications were continued. Scr was stable 1 week after starting Entresto, K 4.4. Since that visit, patient assistance has now been approved for [place] and to start taking once he received it. Pt had a hearing to appeal Medicaid denial scheduled for 2/28.    Today, patient arrives in good spirits. Reports that he received Entresto and has been taking it. Has also received Ronald Young but has not started taking this yet because he wanted to come in for a visit first. He is tolerating Entresto well. He checks his weight at home which has been stable. Has not been checking his BP at home for the past couple of months because the last time he checked it it was normal (130 systolic). Denies dizziness, headaches, blurred vision, swelling, or shortness of breath. He had his Medicaid appeal hearing last month and has another interview on 4/3. No changes in diet/exercise since last visit.   Current HF meds:  Entresto 49/51 mg BID (12pm/11pm) Carvedilol 37.5 mg BID (12pm/12pm)  Spironolactone 25 mg daily (12pm) Farxiga 10 mg daily (has it  at home but has not started) Hydralazine 100 mg TID (12pm/6pm/11pm) Isosorbide mononitrate 30 mg daily  Previously tried: Lisinopril 10 mg daily HCTZ 12.5 mg daily Benazepril 10 mg daily Amlodipine 10 mg daily Losartan 50 mg daily  BP goal: <130/80 mmHg  Family History: Father 92 - not familiar with his history, mother 27 with hypertension; no siblings, 34 twins, one with hypertension  Social History: No alcohol, no tobacco, no regular caffeine - coke once every week or two; marijuana use 3-4x/week,  denies other illicit substance use  Diet: Mostly home cooked, no added salt, uses Ms. Dash; no pork, mostly chicken, does air fry; plenty of vegetables, some canned (rinses and cooks in water)  Exercise: Walks 3 times per week - decreased now due to weather change; not doing anything trying to do one walk   Home BP readings: Not currently checking at home  Labs: 03/19/20: Scr 1.14, K 4.7, Na 138 04/06/20: Scr 1.28, K 4.5, Na 141, BNP 122.5 05/11/20: Scr 1.51, K 4.3, Na 140 - (losartan inc 50 -> 100 mg) 05/26/20: Scr 1.41, K 5.0, Na 142 (losartan 50 mg) 06/19/20: Scr 1.21, K 4.4, Na 142 (losartan > Entresto 49/51 mg BID)  Wt Readings from Last 3 Encounters:  06/15/20 279 lb (126.6 kg)  05/11/20 278 lb 3.2 oz (126.2 kg)  04/29/20 278 lb (126.1 kg)   BP Readings from Last 3 Encounters:  06/15/20 (!) 130/91  05/26/20 (!) 148/98  05/11/20 (!) 158/94   Pulse Readings from Last 3 Encounters:  06/15/20 74  05/26/20 64  05/11/20 71    Renal function: CrCl cannot be calculated (Patient's most recent lab result is older than the maximum 21 days allowed.).  Past Medical History:  Diagnosis Date  . Arthritis   . Cardiomyopathy (HCC) 09/22/2014  . Hypertension     Current Outpatient Medications on File Prior to Visit  Medication Sig Dispense Refill  . acetaminophen-codeine (TYLENOL #3) 300-30 MG tablet Take 1 tablet by mouth every 4 (four) hours as needed for moderate pain or severe pain.    . carvedilol (COREG) 25 MG tablet Take 1.5 tablets (37.5 mg total) by mouth 2 (two) times daily with a meal. 270 tablet 3  . dapagliflozin propanediol (FARXIGA) 10 MG TABS tablet Take 10 mg by mouth daily.    . famotidine (PEPCID) 10 MG tablet Take 1 tablet (10 mg total) by mouth 2 (two) times daily. 60 tablet 3  . hydrALAZINE (APRESOLINE) 50 MG tablet Take 2 tablets (100 mg total) by mouth 3 (three) times daily. 540 tablet 3  . isosorbide mononitrate (IMDUR) 30 MG 24 hr tablet Take 1 tablet (30 mg  total) by mouth daily. 90 tablet 3  . Multiple Vitamins-Minerals (MULTIVITAMIN WITH MINERALS) tablet Take 1 tablet by mouth daily.    . sacubitril-valsartan (ENTRESTO) 49-51 MG Take 1 tablet by mouth 2 (two) times daily.    Marland Kitchen spironolactone (ALDACTONE) 25 MG tablet Take 1 tablet (25 mg total) by mouth daily. 30 tablet 10   No current facility-administered medications on file prior to visit.    Allergies  Allergen Reactions  . Strawberry Extract Hives     Assessment/Plan:  1. Chronic combined systolic and diastolic heart failure/HTN -  BP in clinic 132/86 is slightly elevated above goal <130/80 mmHg. Weight is stable. Will check a bmet today with plan to increase Entresto to 97-103 mg BID if stable. Will also start Farxiga 10 mg daily. For now, continue Entresto 49/51 mg twice  daily, carvedilol 37.5 mg twice daily, spironolactone 25 mg daily, hydralazine 100 mg three times daily, isosorbide mononitrate 30 mg daily. Will call patient with bmet results, titrate Entresto as able, and schedule him for follow up at that time. Encouraged him to increase the number of times per week he walks, especially as the weather gets nicer.   Pervis Hocking, PharmD PGY1 Pharmacy Resident 07/29/2020 2:58 PM

## 2020-07-27 NOTE — Progress Notes (Deleted)
Patient ID: Ronald Young                 DOB: [birth year]                      MRN: [id number]     HPI: Ronald Young is a 50 y.o. male referred by Dr. Bjorn Pippin to pharmacy clinic. PMH is significant for NICM, mild bilateral carotid artery disease, HTN, and cocaine use. He was admitted with decompensated heart failure and hypertensive urgency in August 2021 after having been off his medications for years. ECHO on 12/21/19 showed LVEF <20% with global hypokinesis, moderate LVH, grade 3 diastolic dysfunction, severe RV dysfunction, mild MR, and mild dilatation of ascending aorta. He was seen by Dr. Bjorn Pippin on 04/06/20. At this time, he was doing well and denied any lightheadedness, syncope, LEE, or palpitations. He was started on isosorbide mononitrate and [employer] application for [name] patient assistance was submitted.  He was then seen at pharmacy clinic on 04/20/20. He was tolerating medicines well with a mild, dull HA 1-2 times/week that was not bothersome. Pt had not heard back regarding [employer] PAP for [name]. Losartan increased to 100 mg daily and hydralazine increased to 50 mg TID. At next visit with pharmacy clinic on 05/11/2020, he reported improved BP with readings in the 130's/mid 80's. Was denied by Medicaid and still waiting to hear from [employer] PAP for Entresto. BMET was ordered and collected during visit. Due to second time of Scr increasing secondary to losartan dose change, losartan was decreased from 100 mg daily to 50 mg daily and hydralazine increased from 50 mg TID to 100 mg TID for further BP control. On 05/20/2020, pt received approval for Entresto PAP and denial from [employer] P[employer].   At last pharmacy visit on 05/26/20, pt reported no new side effects since increasing hydralazine from 50 mg TID to 100 mg TID. Did endorse some mild headaches which resolve with Tylenol. BP was still elevated to 148/98. On BMET, K increased to 5.0 so holding off on further spironolactone increases.  Since that visit, pt has now received Entresto from the manufacturer, and has switched losartan to [name], started on 2/11.   Repeat ECHO 06/08/20 showed significant improvement in LV systolic function (EF 45 to 50%), moderate LVH, grade 2 diastolic dysfunction, normal RV function, no significant valvular disease. Last seen by Dr. Bjorn Pippin on 2/14, BP was 130/91 and current medications were continued. Scr was stable 1 week after starting Entresto, K 4.4. Since that visit, patient assistance has now been approved for [place] and to start taking once he received it. Pt had a hearing to appeal Medicaid denial scheduled for 2/28.   Today, ***  -started farxiga/when? Tolerating entresto, farxiga?  -checking bp/weight at home? changes in weight?  -updates on medicaid appeal?  -dizziness, headaches, blurred vision?  -swelling? -any changes in diet/exercise? Plan: bmet, incr entresto?    Current HF meds:  Entresto 49/51 mg BID Carvedilol 37.5 mg BID (10am/6pm)  Spironolactone 25 mg daily (10am) Hydralazine 100 mg TID (10am/6pm/10pm) Isosorbide mononitrate 30 mg daily  Previously tried: Lisinopril 10 mg daily HCTZ 12.5 mg daily Benazepril 10 mg daily Amlodipine 10 mg daily Losartan 50 mg daily  BP goal: <130/80 mmHg  Family History: Father 4 - not familiar with his history, mother 20 with hypertension; no siblings, 62 twins, one with hypertension  Social History: No alcohol, no tobacco, no regular caffeine - coke once every week or two; marijuana use 3-4x/week,  denies other illicit substance use  Diet: Mostly home cooked, no added salt, uses Ms. Dash; no pork, mostly chicken, does air fry; plenty of vegetables, some canned (rinses and cooks in water)  Exercise: Walks 3 times per week - decreased now due to weather change; not doing anything trying to do one walk   Home BP readings: Not currently checking at home  Labs: 03/19/20: Scr 1.14, K 4.7, Na 138 04/06/20: Scr 1.28, K 4.5, Na  141, BNP 122.5 05/11/20: Scr 1.51, K 4.3, Na 140 - (losartan inc 50 -> 100 mg) 05/26/20: Scr 1.41, K 5.0, Na 142 (losartan 50 mg) 06/19/20: Scr 1.21, K 4.4, Na 142 (losartan > Entresto 49/51 mg BID)  Wt Readings from Last 3 Encounters:  06/15/20 279 lb (126.6 kg)  05/11/20 278 lb 3.2 oz (126.2 kg)  04/29/20 278 lb (126.1 kg)   BP Readings from Last 3 Encounters:  06/15/20 (!) 130/91  05/26/20 (!) 148/98  05/11/20 (!) 158/94   Pulse Readings from Last 3 Encounters:  06/15/20 74  05/26/20 64  05/11/20 71    Renal function: CrCl cannot be calculated (Patient's most recent lab result is older than the maximum 21 days allowed.).  Past Medical History:  Diagnosis Date  . Arthritis   . Cardiomyopathy (HCC) 09/22/2014  . Hypertension     Current Outpatient Medications on File Prior to Visit  Medication Sig Dispense Refill  . acetaminophen-codeine (TYLENOL #3) 300-30 MG tablet Take 1 tablet by mouth every 4 (four) hours as needed for moderate pain or severe pain.    . carvedilol (COREG) 25 MG tablet Take 1.5 tablets (37.5 mg total) by mouth 2 (two) times daily with a meal. 270 tablet 3  . dapagliflozin propanediol (FARXIGA) 10 MG TABS tablet Take 10 mg by mouth daily.    . famotidine (PEPCID) 10 MG tablet Take 1 tablet (10 mg total) by mouth 2 (two) times daily. 60 tablet 3  . hydrALAZINE (APRESOLINE) 50 MG tablet Take 2 tablets (100 mg total) by mouth 3 (three) times daily. 540 tablet 3  . isosorbide mononitrate (IMDUR) 30 MG 24 hr tablet Take 1 tablet (30 mg total) by mouth daily. 90 tablet 3  . Multiple Vitamins-Minerals (MULTIVITAMIN WITH MINERALS) tablet Take 1 tablet by mouth daily.    . sacubitril-valsartan (ENTRESTO) 49-51 MG Take 1 tablet by mouth 2 (two) times daily.    . spironolactone (ALDACTONE) 25 MG tablet Take 1 tablet (25 mg total) by mouth daily. 30 tablet 10   No current facility-administered medications on file prior to visit.    Allergies  Allergen Reactions  .  Strawberry Extract Hives     Assessment/Plan:  1. Chronic combined systolic and diastolic heart failure/HTN -  BP in clinic *** elevated and above goal <130/80 mmHg.    

## 2020-07-29 ENCOUNTER — Ambulatory Visit (INDEPENDENT_AMBULATORY_CARE_PROVIDER_SITE_OTHER): Payer: Self-pay | Admitting: Student-PharmD

## 2020-07-29 ENCOUNTER — Other Ambulatory Visit: Payer: Self-pay

## 2020-07-29 VITALS — BP 132/86 | HR 63 | Wt 277.8 lb

## 2020-07-29 DIAGNOSIS — I5043 Acute on chronic combined systolic (congestive) and diastolic (congestive) heart failure: Secondary | ICD-10-CM

## 2020-07-29 DIAGNOSIS — I5042 Chronic combined systolic (congestive) and diastolic (congestive) heart failure: Secondary | ICD-10-CM

## 2020-07-29 NOTE — Patient Instructions (Addendum)
It was nice to see you today!  Your goal blood pressure is less than 130/80 mmHg. In clinic, your blood pressure was 132/86 mmHg.  Medication Changes:  Continue Entresto 49/51 mg twice daily, carvedilol 37.5 mg twice daily, spironolactone 25 mg daily, hydralazine 100 mg three times daily, isosorbide mononitrate 30 mg daily  We are checking lab work today. We'll call you with those results tomorrow and if everything is stable, will increase the dose of Entresto to 97-103 mg twice daily and will start Farxiga 10 mg daily. We will schedule a follow up visit at that time.   Monitor blood pressure at home daily and keep a log (on your phone or piece of paper) to bring with you to your next visit. Write down date, time, blood pressure and pulse.  Keep up the good work with diet and exercise. Aim for a diet full of vegetables, fruit and lean meats (chicken, [place], fish). Try to limit salt intake by eating fresh or frozen vegetables (instead of canned), rinse canned vegetables prior to cooking and do not add any additional salt to meals.   Please give [place] a call at [phone] with any questions or concerns.

## 2020-07-30 ENCOUNTER — Telehealth: Payer: Self-pay | Admitting: Student-PharmD

## 2020-07-30 LAB — BASIC METABOLIC PANEL
BUN/Creatinine Ratio: 10 (ref 9–20)
BUN: 13 mg/dL (ref 6–24)
CO2: 19 mmol/L — ABNORMAL LOW (ref 20–29)
Calcium: 9.7 mg/dL (ref 8.7–10.2)
Chloride: 107 mmol/L — ABNORMAL HIGH (ref 96–106)
Creatinine, Ser: 1.32 mg/dL — ABNORMAL HIGH (ref 0.76–1.27)
Glucose: 103 mg/dL — ABNORMAL HIGH (ref 65–99)
Potassium: 4.4 mmol/L (ref 3.5–5.2)
Sodium: 143 mmol/L (ref 134–144)
eGFR: 67 mL/min/{1.73_m2} (ref >59)

## 2020-07-30 MED ORDER — ENTRESTO 97-103 MG PO TABS: 1.0000 | ORAL_TABLET | Freq: Two times a day (BID) | ORAL | 3 refills | Status: DC

## 2020-07-30 NOTE — Telephone Encounter (Signed)
Results of bmet from yesterday show Scr is stable from previous readings in 2021 (it appears baseline Scr ranges from 1.1-1.5). Called patient to inform him of this and to start taking Farxiga 10 mg daily which he already has at home. Sent in prescription for increased dose of Entresto 97-103 mg BID to [facility] who dispenses Entresto for patients in the patient assistance program. Need to schedule for 4 week follow up visit and bmet (will take a couple weeks for manufacturer to send Entresto). He will continue current dose (49-51 mg) until he receives new dose of Entresto from manufacturer and then will switch to taking 97-103 mg twice daily at that time.   Left voicemail requesting call back.

## 2020-07-30 NOTE — Progress Notes (Deleted)
Cardiology Office Note:    Date:  07/31/2020   ID:  Ronald Young, DOB [birth year], MRN [id number]  PCP:  Patient, No Pcp Per (Inactive)  Cardiologist:  Little Ishikawa, MD  Electrophysiologist:  None   Referring MD: No ref. provider found   Chief Complaint  Patient presents with  . Follow-up    2 months.    History of Present Illness:    Ronald Young is a 50 y.o. male with a hx of nonischemic cardiomyopathy, mild bilateral carotid artery disease, hypertension, cocaine use who presents for follow-up.  He was admitted with decompensated heart failure and hypertensive urgency in August 2021, had been off his medications for years.  Echocardiogram 12/21/2019 showed LVEF less than 20% with global hypokinesis, moderate LVH, grade 3 diastolic dysfunction, severe RV dysfunction, mild MR, mild dilatation of the ascending aorta measuring 40 mm.  RHC/LHC on 12/23/2019 showed normal coronary arteries and filling pressures.  He was discharged on losartan 50 mg daily, carvedilol 25 mg twice daily, spironolactone 25 mg daily.  He was discharged on as needed Lasix, advised to monitor daily weights.     Repeat echocardiogram on 06/08/2020 showed significant improvement in LV systolic function (EF 45 to 50%), moderate LVH, grade 2 diastolic dysfunction, normal RV function, no significant valvular disease.  Since last clinic visit,    he reports that he has been doing well.  Denies any chest pain, dyspnea, lightheadedness, syncope, lower extremity edema.  Does report palpitations about once per month, lasts few seconds.  Started entresto on Friday.    Wt Readings from Last 3 Encounters:  07/31/20 277 lb (125.6 kg)  07/29/20 277 lb 12.8 oz (126 kg)  06/15/20 279 lb (126.6 kg)   BP Readings from Last 3 Encounters:  07/31/20 132/90  07/29/20 132/86  06/15/20 (!) 130/91     Past Medical History:  Diagnosis Date  . Arthritis   . Cardiomyopathy (HCC) 09/22/2014  . Hypertension     Past  Surgical History:  Procedure Laterality Date  . RIGHT/LEFT HEART CATH AND CORONARY ANGIOGRAPHY N/A 12/23/2019   Procedure: RIGHT/LEFT HEART CATH AND CORONARY ANGIOGRAPHY;  Surgeon: Runell Gess, MD;  Location: [facility];  Service: Cardiovascular;  Laterality: N/A;    Current Medications: Current Meds  Medication Sig  . acetaminophen-codeine (TYLENOL #3) 300-30 MG tablet Take 1 tablet by mouth every 4 (four) hours as needed for moderate pain or severe pain.  . carvedilol (COREG) 25 MG tablet Take 1.5 tablets (37.5 mg total) by mouth 2 (two) times daily with a meal.  . dapagliflozin propanediol (FARXIGA) 10 MG TABS tablet Take 10 mg by mouth daily.  . famotidine (PEPCID) 10 MG tablet Take 1 tablet (10 mg total) by mouth 2 (two) times daily.  . hydrALAZINE (APRESOLINE) 50 MG tablet Take 2 tablets (100 mg total) by mouth 3 (three) times daily.  . isosorbide mononitrate (IMDUR) 30 MG 24 hr tablet Take 1 tablet (30 mg total) by mouth daily.  . Multiple Vitamins-Minerals (MULTIVITAMIN WITH MINERALS) tablet Take 1 tablet by mouth daily.  . sacubitril-valsartan (ENTRESTO) 97-103 MG Take 1 tablet by mouth 2 (two) times daily.  Marland Kitchen spironolactone (ALDACTONE) 25 MG tablet Take 1 tablet (25 mg total) by mouth daily.     Allergies:   Strawberry extract   Social History   Socioeconomic History  . Marital status: Single    Spouse name: Not on file  . Number of children: Not on file  . Years of  education: Not on file  . Highest education level: Not on file  Occupational History  . Not on file  Tobacco Use  . Smoking status: Former Smoker    Types: Cigarettes  . Smokeless tobacco: Never Used  Substance and Sexual Activity  . Alcohol use: Yes    Comment: "drank a couple beers last weekend"  . Drug use: Yes    Types: Marijuana    Comment: Last use 10/20/14.  Marland Kitchen Sexual activity: Not on file  Other Topics Concern  . Not on file  Social History Narrative  . Not on file   Social  Determinants of Health   Financial Resource Strain: Medium Risk  . Difficulty of Paying Living Expenses: Somewhat hard  Food Insecurity: Not on file  Transportation Needs: No Transportation Needs  . Lack of Transportation (Medical): No  . Lack of Transportation (Non-Medical): No  Physical Activity: Not on file  Stress: Not on file  Social Connections: Not on file     Family History: [name] family history includes Colon cancer in an other family member; Hypertension in his mother.  ROS:   Please see the history of present illness.     All other systems reviewed and are negative.  EKGs/Labs/Other Studies Reviewed:    The following studies were reviewed today:   EKG:  EKG is ordered today.  EKG demonstrates NSR with first degree AV block, rate 74, TWI in I, II, aVL, V4-6  Recent Labs: 12/20/2019: ALT 43 12/24/2019: Hemoglobin 11.1; Magnesium 2.2; Platelets 326 04/06/2020: BNP 122.5 07/29/2020: BUN 13; Creatinine, Ser 1.32; Potassium 4.4; Sodium 143  Recent Lipid Panel    Component Value Date/Time   CHOL 183 12/21/2019 0443   TRIG 67 12/21/2019 0443   HDL 28 (L) 12/21/2019 0443   CHOLHDL 6.5 12/21/2019 0443   VLDL 13 12/21/2019 0443   LDLCALC 142 (H) 12/21/2019 0443    Physical Exam:    VS:  BP 132/90 (BP Location: Left Arm, Patient Position: Sitting, Cuff Size: Large)   Pulse 72   Ht 6' (1.829 m)   Wt 277 lb (125.6 kg)   BMI 37.57 kg/m     Wt Readings from Last 3 Encounters:  07/31/20 277 lb (125.6 kg)  07/29/20 277 lb 12.8 oz (126 kg)  06/15/20 279 lb (126.6 kg)     GEN: Well nourished, well developed in no acute distress HEENT: Normal NECK: No JVD; No carotid bruits CARDIAC: RRR, no murmurs, rubs, gallops RESPIRATORY:  Clear to auscultation without rales, wheezing or rhonchi  ABDOMEN: Soft, non-tender, non-distended MUSCULOSKELETAL:  No edema; No deformity  SKIN: Warm and dry NEUROLOGIC:  Alert and oriented x 3 PSYCHIATRIC:  Normal affect    ASSESSMENT:    No diagnosis found. 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  RHC/LHC on 12/23/2019 showed normal coronary arteries and filling pressures.  Nonischemic cardiomyopathy, suspect due to uncontrolled hypertension.  Repeat echocardiogram on 06/08/2020 showed significant improvement in LV systolic function (EF 45 to 50%), moderate LVH, grade 2 diastolic dysfunction, normal RV function, no significant valvular disease. -Entresto dose was increased to 97-23 mg twice daily yesterday.  He has not started this yet. -Continue carvedilol 37.5 mg twice daily -Continue spironolactone 25 mg daily -Continue hydralazine 100 mg 3 times daily and Imdur 30 mg  daily -Prescribed Farxiga 10 mg daily, has not started yet -Recommend checking BMP 1 week after starting [place] and increased dose of Entresto. -As needed Lasix, currently appears euvolemic.  Do not suspect he will need Lasix with starting Farxiga -Ideally would check cardiac MRI to evaluate etiology of nonischemic cardiomyopathy.  However holding off on further testing for now as he has applied for Medicaid and awaiting results.  Would also consider Zio patch to quantify PVC burden when able.  Hypertension: Continue entresto, carvedilol, spironolactone, hydralazine/Imdur as above.  Cocaine use: Denies recent use, UDS negative on recent admission.  Encouraged continued cessation  GERD: Continue famotidine  Snoring: Suspect undiagnosed OSA contributing to difficult to control hypertension.  He is in the process of applying for Medicaid, will plan sleep study once that is completed  RTC in 3 months  Medication Adjustments/Labs and Tests Ordered: Current medicines are reviewed at length with the patient today.   Concerns regarding medicines are outlined above.  No orders of the defined types were placed in this encounter.  No orders of the defined types were placed in this encounter.   There are no Patient Instructions on file for this visit.   Signed, Little Ishikawa, MD  07/31/2020 3:11 PM    [facility]

## 2020-07-30 NOTE — Telephone Encounter (Signed)
Patient returned phone call. He will start taking Farxiga 10 mg daily today. Will continue Entresto 49-51 mg BID until new dose of 97-103 mg BID arrives from the manufacturer, which he can switch over to when it arrives. Scheduled for 4 week office visit, will need bmet at that time.

## 2020-07-31 ENCOUNTER — Other Ambulatory Visit: Payer: Self-pay

## 2020-07-31 ENCOUNTER — Encounter: Payer: Self-pay | Admitting: Cardiology

## 2020-07-31 ENCOUNTER — Ambulatory Visit (INDEPENDENT_AMBULATORY_CARE_PROVIDER_SITE_OTHER): Payer: Self-pay | Admitting: Cardiology

## 2020-07-31 VITALS — BP 132/90 | HR 72 | Ht 72.0 in | Wt 277.0 lb

## 2020-07-31 DIAGNOSIS — R0683 Snoring: Secondary | ICD-10-CM

## 2020-07-31 DIAGNOSIS — F1290 Cannabis use, unspecified, uncomplicated: Secondary | ICD-10-CM

## 2020-07-31 DIAGNOSIS — I5042 Chronic combined systolic (congestive) and diastolic (congestive) heart failure: Secondary | ICD-10-CM

## 2020-07-31 DIAGNOSIS — I10 Essential (primary) hypertension: Secondary | ICD-10-CM

## 2020-07-31 NOTE — Patient Instructions (Addendum)
Medication Instructions:  Continue all medications *If you need a refill on your cardiac medications before your next appointment, please call your pharmacy*   Lab Work: Have Bmet done 1 week after starting [name] and Sherryll Burger   Testing/Procedures: None ordered   Follow-Up: At [facility], you and your health needs are our priority.  As part of our continuing mission to provide you with exceptional heart care, we have created designated Provider Care Teams.  These Care Teams include your primary Cardiologist (physician) and Advanced Practice Providers (APPs -  Physician Assistants and Nurse Practitioners) who all work together to provide you with the care you need, when you need it.  We recommend signing up for the patient portal called "MyChart".  Sign up information is provided on this After Visit Summary.  MyChart is used to connect with patients for Virtual Visits (Telemedicine).  Patients are able to view lab/test results, encounter notes, upcoming appointments, etc.  Non-urgent messages can be sent to your provider as well.   To learn more about what you can do with MyChart, go to [email or web address].    Your next appointment:  3 months     Wed 11/18/20 at 2:00 pm   The format for your next appointment: Office    Provider:  Dr.Schumann

## 2020-07-31 NOTE — Telephone Encounter (Signed)
Thanks [address] and [place]! [employer]

## 2020-07-31 NOTE — Progress Notes (Signed)
Cardiology Office Note:    Date:  08/01/2020   ID:  Ronald Young, DOB [birth year], MRN [id number]  PCP:  Patient, No Pcp Per (Inactive)  Cardiologist:  Little Ishikawa, MD  Electrophysiologist:  None   Referring MD: No ref. provider found   Chief Complaint  Patient presents with  . Follow-up    2 months.  . Congestive Heart Failure    History of Present Illness:    Ronald Young is a 50 y.o. male with a hx of nonischemic cardiomyopathy, mild bilateral carotid artery disease, hypertension, cocaine use who presents for follow-up.  He was admitted with decompensated heart failure and hypertensive urgency in August 2021, had been off his medications for years.  Echocardiogram 12/21/2019 showed LVEF less than 20% with global hypokinesis, moderate LVH, grade 3 diastolic dysfunction, severe RV dysfunction, mild MR, mild dilatation of the ascending aorta measuring 40 mm.  RHC/LHC on 12/23/2019 showed normal coronary arteries and filling pressures.  He was discharged on losartan 50 mg daily, carvedilol 25 mg twice daily, spironolactone 25 mg daily.  He was discharged on as needed Lasix, advised to monitor daily weights.     Repeat echocardiogram on 06/08/2020 showed significant improvement in LV systolic function (EF 45 to 50%), moderate LVH, grade 2 diastolic dysfunction, normal RV function, no significant valvular disease.  Since last clinic visit, he reports that he has been doing well. He had palpitations about once per month, lasts few seconds but this has since resolved. He has questions about his prescribed medications. BP improved, had recent 130/80. He denies any chest pain and tightness, dyspnea, lightheadedness, syncope, or  lower extremity edema. He is smoking  marijuana 3 times a week but no cigarettes. No cocaine. He usually walks 3 times a week but now the weather has improved he is going to increase this.   Wt Readings from Last 3 Encounters:  07/31/20 277 lb (125.6 kg)  07/29/20  277 lb 12.8 oz (126 kg)  06/15/20 279 lb (126.6 kg)   BP Readings from Last 3 Encounters:  07/31/20 132/90  07/29/20 132/86  06/15/20 (!) 130/91   Past Medical History:  Diagnosis Date  . Arthritis   . Cardiomyopathy (HCC) 09/22/2014  . Hypertension     Past Surgical History:  Procedure Laterality Date  . RIGHT/LEFT HEART CATH AND CORONARY ANGIOGRAPHY N/A 12/23/2019   Procedure: RIGHT/LEFT HEART CATH AND CORONARY ANGIOGRAPHY;  Surgeon: Runell Gess, MD;  Location: [facility];  Service: Cardiovascular;  Laterality: N/A;   Current Medications: Current Meds  Medication Sig  . acetaminophen-codeine (TYLENOL #3) 300-30 MG tablet Take 1 tablet by mouth every 4 (four) hours as needed for moderate pain or severe pain.  . carvedilol (COREG) 25 MG tablet Take 1.5 tablets (37.5 mg total) by mouth 2 (two) times daily with a meal.  . dapagliflozin propanediol (FARXIGA) 10 MG TABS tablet Take 10 mg by mouth daily.  . famotidine (PEPCID) 10 MG tablet Take 1 tablet (10 mg total) by mouth 2 (two) times daily.  . hydrALAZINE (APRESOLINE) 50 MG tablet Take 2 tablets (100 mg total) by mouth 3 (three) times daily.  . isosorbide mononitrate (IMDUR) 30 MG 24 hr tablet Take 1 tablet (30 mg total) by mouth daily.  . Multiple Vitamins-Minerals (MULTIVITAMIN WITH MINERALS) tablet Take 1 tablet by mouth daily.  . sacubitril-valsartan (ENTRESTO) 97-103 MG Take 1 tablet by mouth 2 (two) times daily.  Marland Kitchen spironolactone (ALDACTONE) 25 MG tablet Take 1 tablet (25 mg  total) by mouth daily.     Allergies:   Strawberry extract   Social History   Socioeconomic History  . Marital status: Single    Spouse name: Not on file  . Number of children: Not on file  . Years of education: Not on file  . Highest education level: Not on file  Occupational History  . Not on file  Tobacco Use  . Smoking status: Former Smoker    Types: Cigarettes  . Smokeless tobacco: Never Used  Substance and Sexual Activity   . Alcohol use: Yes    Comment: "drank a couple beers last weekend"  . Drug use: Yes    Types: Marijuana    Comment: Last use 10/20/14.  Marland Kitchen Sexual activity: Not on file  Other Topics Concern  . Not on file  Social History Narrative  . Not on file   Social Determinants of Health   Financial Resource Strain: Medium Risk  . Difficulty of Paying Living Expenses: Somewhat hard  Food Insecurity: Not on file  Transportation Needs: No Transportation Needs  . Lack of Transportation (Medical): No  . Lack of Transportation (Non-Medical): No  Physical Activity: Not on file  Stress: Not on file  Social Connections: Not on file     Family History: [name] family history includes Colon cancer in an other family member; Hypertension in his mother.  ROS:   Please see the history of present illness.     All other systems reviewed and are negative.  EKGs/Labs/Other Studies Reviewed:    The following studies were reviewed today:  EKG:   4/22- EKG was not ordered today.  2/22- NSR with first degree AV block, rate 74, TWI in I, II, aVL, V4-6  Recent Labs: 12/20/2019: ALT 43 12/24/2019: Hemoglobin 11.1; Magnesium 2.2; Platelets 326 04/06/2020: BNP 122.5 07/29/2020: BUN 13; Creatinine, Ser 1.32; Potassium 4.4; Sodium 143  Recent Lipid Panel    Component Value Date/Time   CHOL 183 12/21/2019 0443   TRIG 67 12/21/2019 0443   HDL 28 (L) 12/21/2019 0443   CHOLHDL 6.5 12/21/2019 0443   VLDL 13 12/21/2019 0443   LDLCALC 142 (H) 12/21/2019 0443    Physical Exam:    VS:  BP 132/90 (BP Location: Left Arm, Patient Position: Sitting, Cuff Size: Large)   Pulse 72   Ht 6' (1.829 m)   Wt 277 lb (125.6 kg)   BMI 37.57 kg/m     Wt Readings from Last 3 Encounters:  07/31/20 277 lb (125.6 kg)  07/29/20 277 lb 12.8 oz (126 kg)  06/15/20 279 lb (126.6 kg)     GEN: Well nourished, well developed in no acute distress HEENT: Normal NECK: No JVD; No carotid bruits CARDIAC: RRR, no murmurs,  rubs, gallops RESPIRATORY:  Clear to auscultation without rales, wheezing or rhonchi  ABDOMEN: Soft, non-tender, non-distended MUSCULOSKELETAL:  No edema; No deformity  SKIN: Warm and dry NEUROLOGIC:  Alert and oriented x 3 PSYCHIATRIC:  Normal affect   ASSESSMENT:    1. Chronic combined systolic and diastolic heart failure (HCC)   2. Essential hypertension   3. Marijuana use   4. Snoring    PLAN:    Chronic combined systolic and diastolic heart failure: Echocardiogram 12/21/2019 showed LVEF less than 20% with global hypokinesis, moderate LVH, grade 3 diastolic dysfunction, severe RV dysfunction, mild MR, mild dilatation of the ascending aorta measuring 40 mm.  RHC/LHC on 12/23/2019 showed normal coronary arteries and filling pressures.  Nonischemic cardiomyopathy, suspect due to  uncontrolled hypertension.  Repeat echocardiogram on 06/08/2020 showed significant improvement in LV systolic function (EF 45 to 50%), moderate LVH, grade 2 diastolic dysfunction, normal RV function, no significant valvular disease. -Entresto dose was increased to 97-23 mg twice daily yesterday.  He has not started this yet. -Continue carvedilol 37.5 mg twice daily -Continue spironolactone 25 mg daily -Continue hydralazine 100 mg 3 times daily and Imdur 30 mg daily -Prescribed Farxiga 10 mg daily, has not started yet -Recommend checking BMP 1 week after starting [place] and increased dose of Entresto. -As needed Lasix, currently appears euvolemic.  Do not suspect he will need Lasix with starting Farxiga -Ideally would check cardiac MRI to evaluate etiology of nonischemic cardiomyopathy.  However holding off on further testing for now as he has applied for Medicaid and awaiting results.  Would also consider Zio patch to quantify PVC burden when able.  Hypertension: Continue entresto, carvedilol, spironolactone, hydralazine/Imdur as above.  Cocaine use: Denies recent use, UDS negative on recent admission.  Encouraged  continued cessation  Marijuana use: encourage cessation  GERD: Continue famotidine  Snoring: Suspect undiagnosed OSA contributing to difficult to control hypertension.  He is in the process of applying for Medicaid, will plan sleep study once that is completed  RTC in 3 months   Medication Adjustments/Labs and Tests Ordered: Current medicines are reviewed at length with the patient today.  Concerns regarding medicines are outlined above.  Orders Placed This Encounter  Procedures  . Basic metabolic panel   No orders of the defined types were placed in this encounter.   Patient Instructions  Medication Instructions:  Continue all medications *If you need a refill on your cardiac medications before your next appointment, please call your pharmacy*   Lab Work: Have Bmet done 1 week after starting [name] and Sherryll Burger   Testing/Procedures: None ordered   Follow-Up: At [facility], you and your health needs are our priority.  As part of our continuing mission to provide you with exceptional heart care, we have created designated Provider Care Teams.  These Care Teams include your primary Cardiologist (physician) and Advanced Practice Providers (APPs -  Physician Assistants and Nurse Practitioners) who all work together to provide you with the care you need, when you need it.  We recommend signing up for the patient portal called "MyChart".  Sign up information is provided on this After Visit Summary.  MyChart is used to connect with patients for Virtual Visits (Telemedicine).  Patients are able to view lab/test results, encounter notes, upcoming appointments, etc.  Non-urgent messages can be sent to your provider as well.   To learn more about what you can do with MyChart, go to [email or web address].    Your next appointment:  3 months     Wed 11/18/20 at 2:00 pm   The format for your next appointment: Office    Provider:  Dr.Griselda Tosh      I,Alexis Bryant,acting as a  scribe for Little Ishikawa, MD.,have documented all relevant documentation on the behalf of Little Ishikawa, MD,as directed by  Little Ishikawa, MD while in the presence of Little Ishikawa, MD.  Signed, Little Ishikawa, MD  08/01/2020 3:46 PM    [facility]

## 2020-08-10 LAB — BASIC METABOLIC PANEL
BUN/Creatinine Ratio: 13 (ref 9–20)
BUN: 18 mg/dL (ref 6–24)
CO2: 21 mmol/L (ref 20–29)
Calcium: 9.6 mg/dL (ref 8.7–10.2)
Chloride: 104 mmol/L (ref 96–106)
Creatinine, Ser: 1.43 mg/dL — ABNORMAL HIGH (ref 0.76–1.27)
Glucose: 107 mg/dL — ABNORMAL HIGH (ref 65–99)
Potassium: 4.4 mmol/L (ref 3.5–5.2)
Sodium: 140 mmol/L (ref 134–144)
eGFR: 60 mL/min/{1.73_m2} (ref >59)

## 2020-08-13 ENCOUNTER — Encounter: Payer: Self-pay | Admitting: *Deleted

## 2020-08-27 ENCOUNTER — Ambulatory Visit: Payer: Self-pay

## 2020-09-10 ENCOUNTER — Ambulatory Visit (INDEPENDENT_AMBULATORY_CARE_PROVIDER_SITE_OTHER): Payer: Self-pay | Admitting: Pharmacist

## 2020-09-10 ENCOUNTER — Other Ambulatory Visit: Payer: Self-pay

## 2020-09-10 VITALS — BP 156/100 | HR 60 | Wt 283.0 lb

## 2020-09-10 DIAGNOSIS — I5042 Chronic combined systolic (congestive) and diastolic (congestive) heart failure: Secondary | ICD-10-CM

## 2020-09-10 DIAGNOSIS — I5043 Acute on chronic combined systolic (congestive) and diastolic (congestive) heart failure: Secondary | ICD-10-CM

## 2020-09-10 MED ORDER — SPIRONOLACTONE 25 MG PO TABS
25.0000 mg | ORAL_TABLET | Freq: Every day | ORAL | 3 refills | Status: DC
  Filled 2021-02-17: qty 30, 30d supply, fill #0

## 2020-09-10 NOTE — Progress Notes (Signed)
Patient ID: Ronald Young                 DOB: [birth year]                      MRN: [id number]     HPI: Ronald Young is a 50 y.o. male referred by Ronald Young to pharmacy clinic. PMH is significant for NICM, mild bilateral carotid artery disease, HTN, and cocaine use. He was admitted with decompensated heart failure and hypertensive urgency in August 2021 after having been off his medications for years. ECHO on 12/21/19 showed LVEF <20% with global hypokinesis, moderate LVH, grade 3 diastolic dysfunction, severe RV dysfunction, mild MR, and mild dilatation of ascending aorta. He was seen by Ronald Young on 04/06/20. At this time, he was doing well and denied any lightheadedness, syncope, LEE, or palpitations. He was started on isosorbide mononitrate and [employer] application for [name] patient assistance was submitted.  Pt has been following with PharmD since Dec 2021. He has since been approved for [place] patient assistance, Entresto and hydralazine doses have been titrated. He was denied for Medicaid. Repeat echo 06/08/20 showed significant improvement in LV systolic function (EF 45 to 50%), moderate LVH, grade 2 diastolic dysfunction, normal RV function, no significant valvular disease. At last visit with PharmD on 3/30 and with Dr Bjorn Young on 4/1, pt had not started [place] yet. He is taking 2 of the 50mg  hydralazine tabs TID since this is cheaper at [facility] than the 100mg  tablet dosing.  Today, patient arrives in good spirits. He has started [facility]. However, reports he has still been taking lower dose of Entresto 49-51mg  BID since he had a lot of the medication left. Did receive new rx for the higher 97-103mg  BID dose though which he also has at home. Tolerating medications well, denies dizziness headache, LEE, SOB. Was out of town recently, has not been checking weight or BP. Reports dietary indiscretion and missing a few doses of his hydralazine and carvedilol recently since he was out  of town - weight and BP up today more than normal. Also took 800mg  of Aleve earlier today. Only walking about once per week. Still pending follow up Medicaid appeal hearing/interview.  Current HF meds:  Entresto 97/103 mg BID (12pm/11pm) - has been using up 49-51mg  tablet still Carvedilol 37.5 mg BID (12pm/12pm)  Spironolactone 25 mg daily (12pm) Farxiga 10 mg daily (has it at home but has not started) Hydralazine 100 mg TID (12pm/6pm/11pm) Isosorbide mononitrate 30 mg daily  Previously tried: Lisinopril 10 mg daily HCTZ 12.5 mg daily Benazepril 10 mg daily Amlodipine 10 mg daily Losartan 50 mg daily  BP goal: <130/80 mmHg  Family History: Father 38 - not familiar with his history, mother 40 with hypertension; no siblings, 11 twins, one with hypertension  Social History: No alcohol, no tobacco, no regular caffeine - coke once every week or two; marijuana use 3-4x/week, denies other illicit substance use  Diet: Mostly home cooked, no added salt, uses Ms. Dash; no pork, mostly chicken, does air fry; plenty of vegetables, some canned (rinses and cooks in water). No caffeine  Exercise: Walking 1x per week  Home BP readings: Not currently checking at home  Wt Readings from Last 3 Encounters:  07/31/20 277 lb (125.6 kg)  07/29/20 277 lb 12.8 oz (126 kg)  06/15/20 279 lb (126.6 kg)   BP Readings from Last 3 Encounters:  07/31/20 132/90  07/29/20 132/86  06/15/20 (!) 130/91  Pulse Readings from Last 3 Encounters:  07/31/20 72  07/29/20 63  06/15/20 74    Renal function: CrCl cannot be calculated (Patient's most recent lab result is older than the maximum 21 days allowed.).  Past Medical History:  Diagnosis Date  . Arthritis   . Cardiomyopathy (HCC) 09/22/2014  . Hypertension     Current Outpatient Medications on File Prior to Visit  Medication Sig Dispense Refill  . acetaminophen-codeine (TYLENOL #3) 300-30 MG tablet Take 1 tablet by mouth every 4 (four) hours as  needed for moderate pain or severe pain.    . carvedilol (COREG) 25 MG tablet Take 1.5 tablets (37.5 mg total) by mouth 2 (two) times daily with a meal. 270 tablet 3  . dapagliflozin propanediol (FARXIGA) 10 MG TABS tablet Take 10 mg by mouth daily.    . famotidine (PEPCID) 10 MG tablet Take 1 tablet (10 mg total) by mouth 2 (two) times daily. 60 tablet 3  . hydrALAZINE (APRESOLINE) 50 MG tablet Take 2 tablets (100 mg total) by mouth 3 (three) times daily. 540 tablet 3  . isosorbide mononitrate (IMDUR) 30 MG 24 hr tablet Take 1 tablet (30 mg total) by mouth daily. 90 tablet 3  . Multiple Vitamins-Minerals (MULTIVITAMIN WITH MINERALS) tablet Take 1 tablet by mouth daily.    . sacubitril-valsartan (ENTRESTO) 97-103 MG Take 1 tablet by mouth 2 (two) times daily. 180 tablet 3  . spironolactone (ALDACTONE) 25 MG tablet Take 1 tablet (25 mg total) by mouth daily. 30 tablet 10   No current facility-administered medications on file prior to visit.    Allergies  Allergen Reactions  . Strawberry Extract Hives     Assessment/Plan:  1. Chronic combined systolic and diastolic heart failure/HTN -  BP in clinic today higher than normal and above goal <130/80 mmHg. This is likely secondary to dietary indiscretion and pt missing a few doses of his meds while he was out of town recently, also took high dose of Aleve this AM. Checking BMET today with recent [place] start. Pt has only been taking 49-51mg  BID of Entresto instead of 97-103mg  BID (he did receive shipment of 97-103mg  BID but was using up old rx still). If BMET is stable today, will increase Entresto to 97-103mg  BID. Continue Farxiga 10mg  daily, carvedilol 37.5 mg BID, spironolactone 25 mg daily, hydralazine 100 mg TID, and Imdur 30 mg daily. Will call patient with bmet results, titrate Entresto as able, and schedule him for follow up at that time. Also encouraged him to increase walking.  Hanad Leino E. Advait Buice, PharmD, BCACP, CPP [facility] [address], [place], [place] Phone: [phone]; Fax: [phone] 09/10/2020 2:29 PM

## 2020-09-10 NOTE — Patient Instructions (Addendum)
It was nice to see you today!  Your blood pressure goal is < 130/73mmHg  If your lab work is stable today, we'll plan to increase your Entresto from 49-51mg  twice daily to 97-103mg  twice daily (you would take 2 of your 49-51mg  tablets twice a day to use them up, then start taking 1 tablet twice a day of the 97-103mg  tablet)  Monitor and record your blood pressure readings at home. If we do increase the dose of your Entresto, we will schedule a follow up visit in a few weeks  Continue taking your other medications

## 2020-09-11 ENCOUNTER — Telehealth: Payer: Self-pay | Admitting: Pharmacist

## 2020-09-11 DIAGNOSIS — I5043 Acute on chronic combined systolic (congestive) and diastolic (congestive) heart failure: Secondary | ICD-10-CM

## 2020-09-11 DIAGNOSIS — I429 Cardiomyopathy, unspecified: Secondary | ICD-10-CM

## 2020-09-11 LAB — BASIC METABOLIC PANEL
BUN/Creatinine Ratio: 15 (ref 9–20)
BUN: 17 mg/dL (ref 6–24)
CO2: 20 mmol/L (ref 20–29)
Calcium: 9.9 mg/dL (ref 8.7–10.2)
Chloride: 105 mmol/L (ref 96–106)
Creatinine, Ser: 1.17 mg/dL (ref 0.76–1.27)
Glucose: 97 mg/dL (ref 65–99)
Potassium: 4.4 mmol/L (ref 3.5–5.2)
Sodium: 142 mmol/L (ref 134–144)
eGFR: 77 mL/min/{1.73_m2} (ref >59)

## 2020-09-11 NOTE — Addendum Note (Signed)
Addended by: Malena Peer D on: 09/11/2020 08:29 AM   Modules accepted: Orders

## 2020-09-11 NOTE — Telephone Encounter (Addendum)
Renal function and K stable. Will increase to Entresto 97/103mg  twice a day as planned. Called and spoke with patient- advised labs are stable. He will increase Entresto to 97/103mg  BID. I advised that if he had any of the 49/51mg  at home, he could take 2 49/51mg  tablets twice a day until he ran out. Aundra Millet Supple will call patient in 2 weeks as a follow up. He states anytime would be fine (no preferred day or time) Patient will come in for lab work 5/27.

## 2020-09-25 ENCOUNTER — Other Ambulatory Visit: Payer: Self-pay

## 2020-09-25 ENCOUNTER — Other Ambulatory Visit: Payer: Self-pay | Admitting: *Deleted

## 2020-09-25 DIAGNOSIS — I429 Cardiomyopathy, unspecified: Secondary | ICD-10-CM

## 2020-09-25 LAB — BASIC METABOLIC PANEL
BUN/Creatinine Ratio: 14 (ref 9–20)
BUN: 19 mg/dL (ref 6–24)
CO2: 20 mmol/L (ref 20–29)
Calcium: 10 mg/dL (ref 8.7–10.2)
Chloride: 106 mmol/L (ref 96–106)
Creatinine, Ser: 1.4 mg/dL — ABNORMAL HIGH (ref 0.76–1.27)
Glucose: 90 mg/dL (ref 65–99)
Potassium: 4.4 mmol/L (ref 3.5–5.2)
Sodium: 141 mmol/L (ref 134–144)
eGFR: 62 mL/min/{1.73_m2} (ref >59)

## 2020-10-07 ENCOUNTER — Telehealth: Payer: Self-pay | Admitting: Pharmacist

## 2020-10-07 NOTE — Telephone Encounter (Signed)
Called pt to follow up with Entresto tolerability. Reports chest pain resolved and he's tolerating Entresto 97-103mg  tablets BID just fine. Reports home BP have been at goal <130/60mmHg. He's aware to continue current meds and keep follow up appt with Dr Bjorn Pippin in July.    Little Ishikawa, MD  09/27/2020 9:56 PM EDT Back to Top     Agree with plan   Awilda Metro, RPH-CPP  09/25/2020 5:10 PM EDT      BMET checked since increasing Entresto to 97-103mg  BID. SCr higher than labs from 2 weeks ago but overall stable with prior labs. Pt reports home BP readings of 132/90, 135/90, and 130/87. Reports compliance with all other CHF meds. States he noticed chest pain after he took Entresto 97-103mg  tablet each time (total of 3 tablets), then he went back to his old prescription of 49-51mg  and took 2 tablets BID and felt better. Unsure if this is related to the [facility] as he was still on the same total daily dose of 97-103mg  BID each time. Encouraged pt to try higher dose again as insurance will not cover 4 tablets per day for him to continue doubling up on his old rx.  He is aware I will forward to Dr Bjorn Pippin for any additional input.

## 2020-10-13 ENCOUNTER — Other Ambulatory Visit: Payer: Self-pay

## 2020-10-13 ENCOUNTER — Emergency Department (HOSPITAL_COMMUNITY): Payer: Self-pay

## 2020-10-13 ENCOUNTER — Emergency Department (HOSPITAL_COMMUNITY)
Admission: EM | Admit: 2020-10-13 | Discharge: 2020-10-13 | Disposition: A | Discharge status: Home / Self Care | Payer: Self-pay | Attending: Emergency Medicine | Admitting: Emergency Medicine

## 2020-10-13 ENCOUNTER — Encounter (HOSPITAL_COMMUNITY): Payer: Self-pay

## 2020-10-13 DIAGNOSIS — I110 Hypertensive heart disease with heart failure: Secondary | ICD-10-CM | POA: Insufficient documentation

## 2020-10-13 DIAGNOSIS — M5410 Radiculopathy, site unspecified: Secondary | ICD-10-CM

## 2020-10-13 DIAGNOSIS — Z79899 Other long term (current) drug therapy: Secondary | ICD-10-CM | POA: Insufficient documentation

## 2020-10-13 DIAGNOSIS — M5416 Radiculopathy, lumbar region: Secondary | ICD-10-CM | POA: Insufficient documentation

## 2020-10-13 DIAGNOSIS — G8929 Other chronic pain: Secondary | ICD-10-CM | POA: Insufficient documentation

## 2020-10-13 DIAGNOSIS — M25561 Pain in right knee: Secondary | ICD-10-CM | POA: Insufficient documentation

## 2020-10-13 DIAGNOSIS — Z87891 Personal history of nicotine dependence: Secondary | ICD-10-CM | POA: Insufficient documentation

## 2020-10-13 DIAGNOSIS — I5043 Acute on chronic combined systolic (congestive) and diastolic (congestive) heart failure: Secondary | ICD-10-CM | POA: Insufficient documentation

## 2020-10-13 DIAGNOSIS — M25562 Pain in left knee: Secondary | ICD-10-CM | POA: Insufficient documentation

## 2020-10-13 IMAGING — CR DG LUMBAR SPINE COMPLETE 4+V
5 series · 5 of 5 positions shown · non-contrast
Comparison: Lumbar spine radiograph dated [DATE]

CLINICAL DATA: 50-year-old male with low back pain.

EXAM:
LUMBAR SPINE - COMPLETE 4+ VIEW

[t lumbar spine ap]
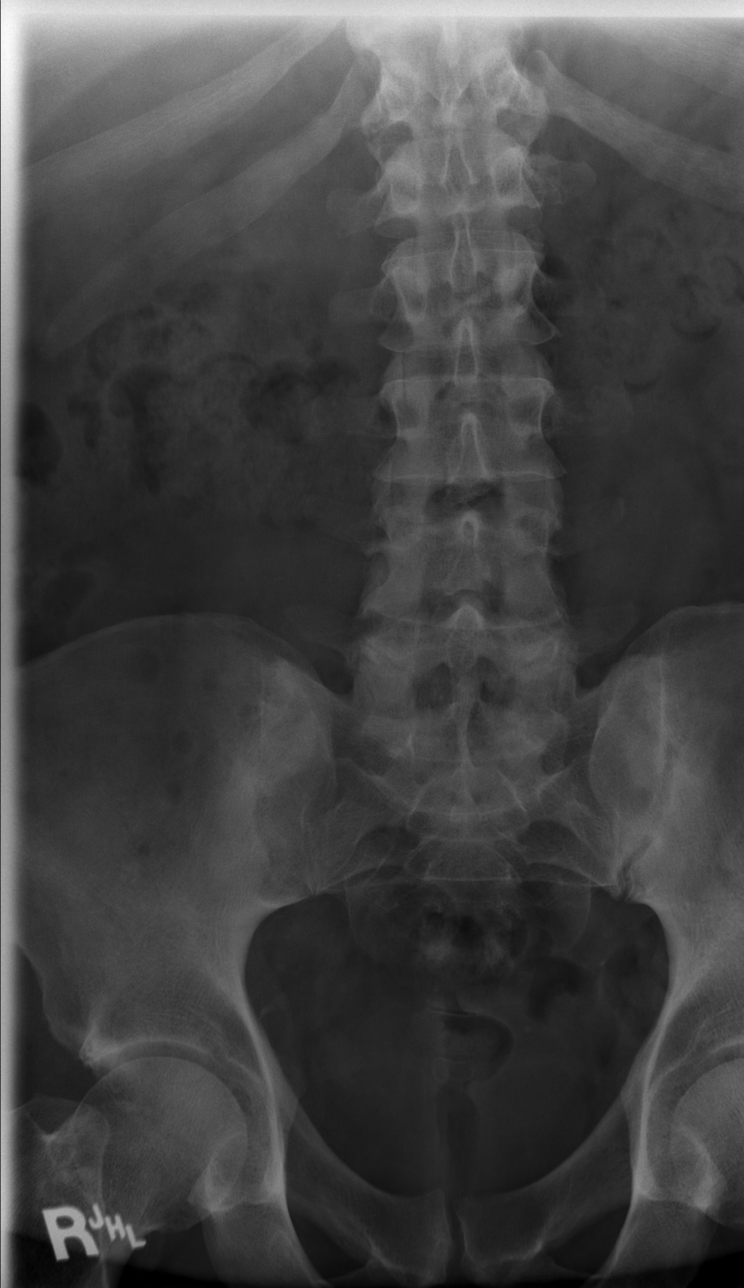

[t lumbar spine obl (1 of 2)]
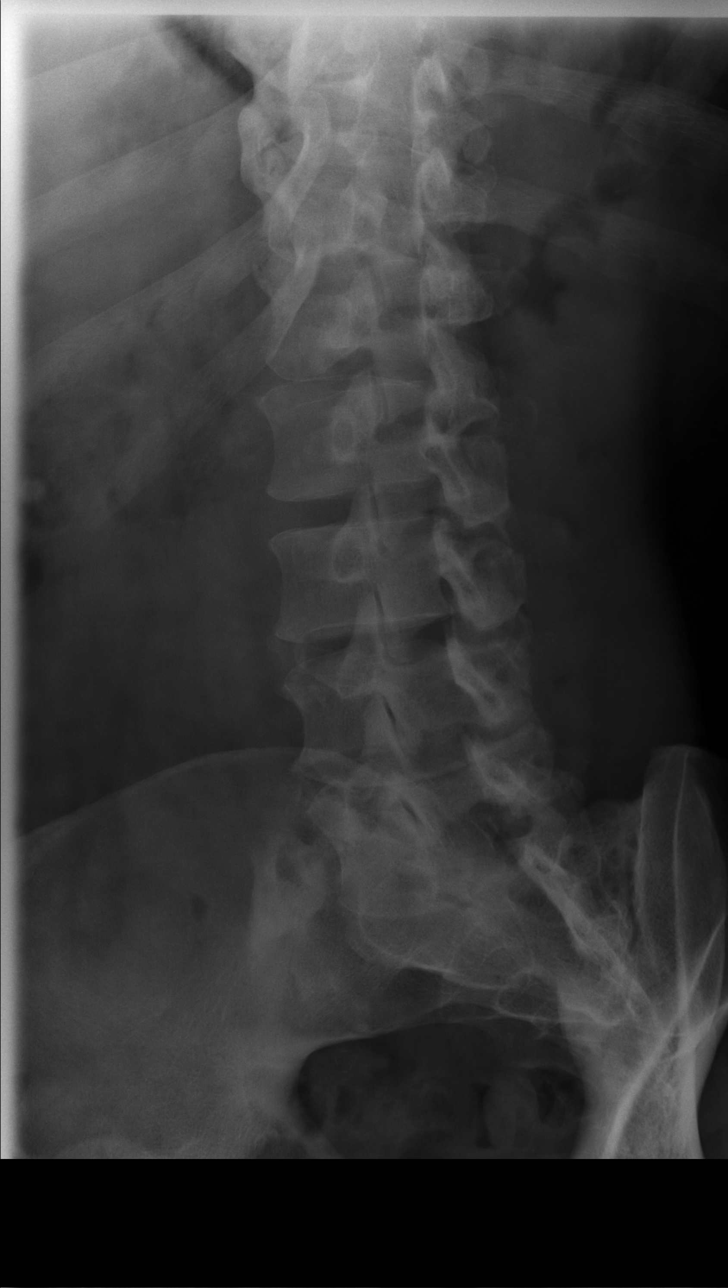

[t lumbar spine obl (2 of 2)]
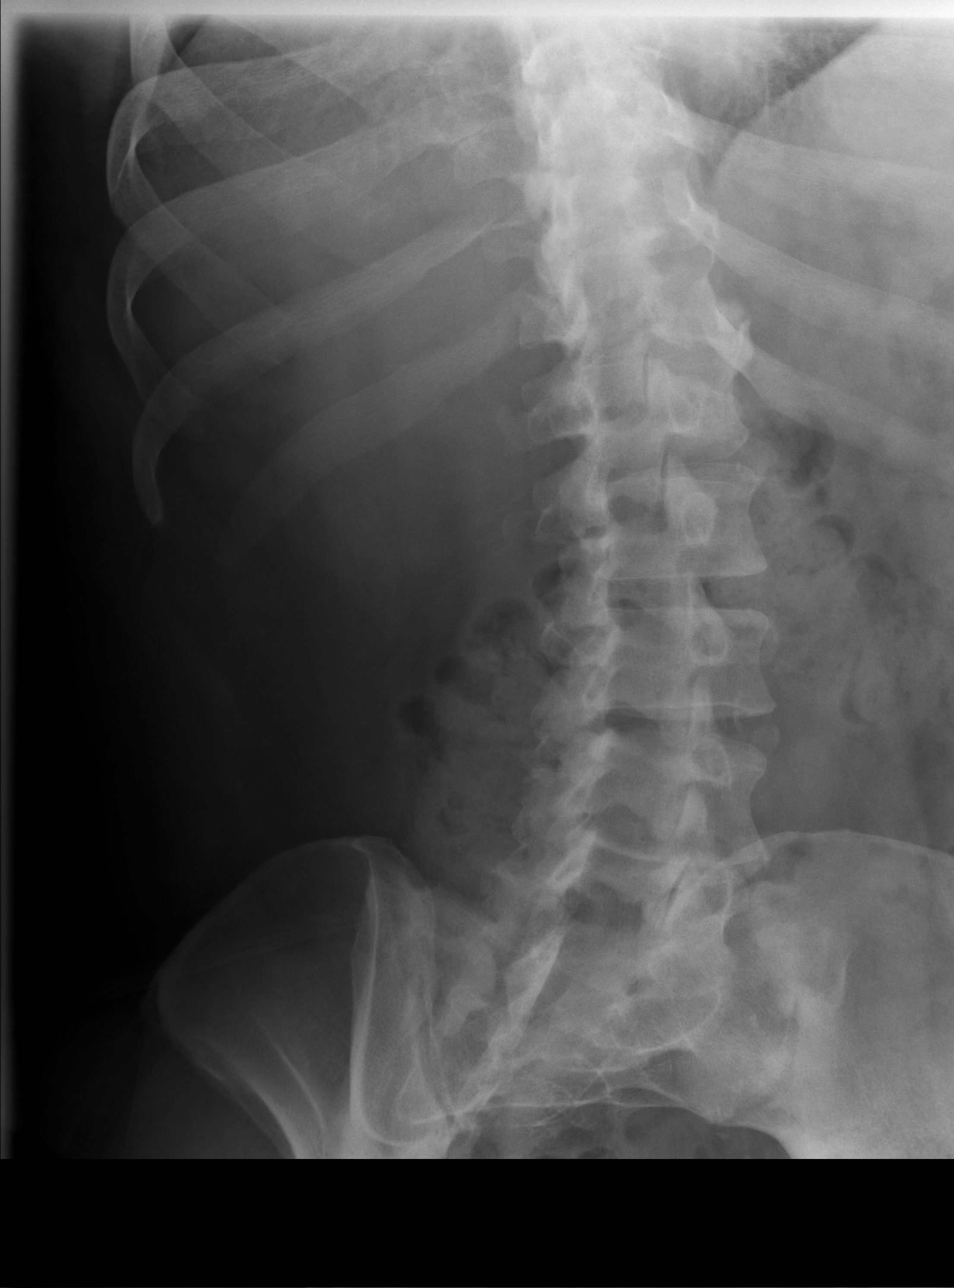

[t lumbar spine lat]
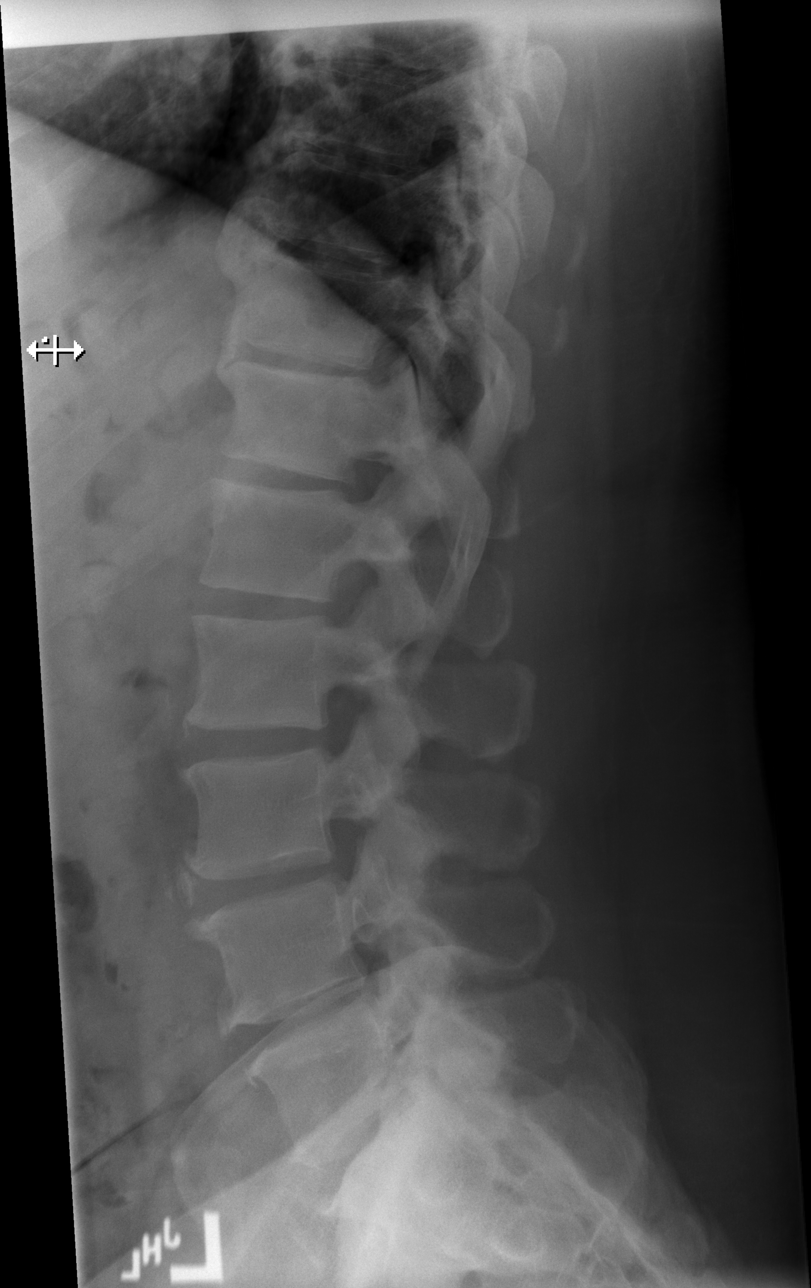

[t lumbar l-5 s-1 spot]
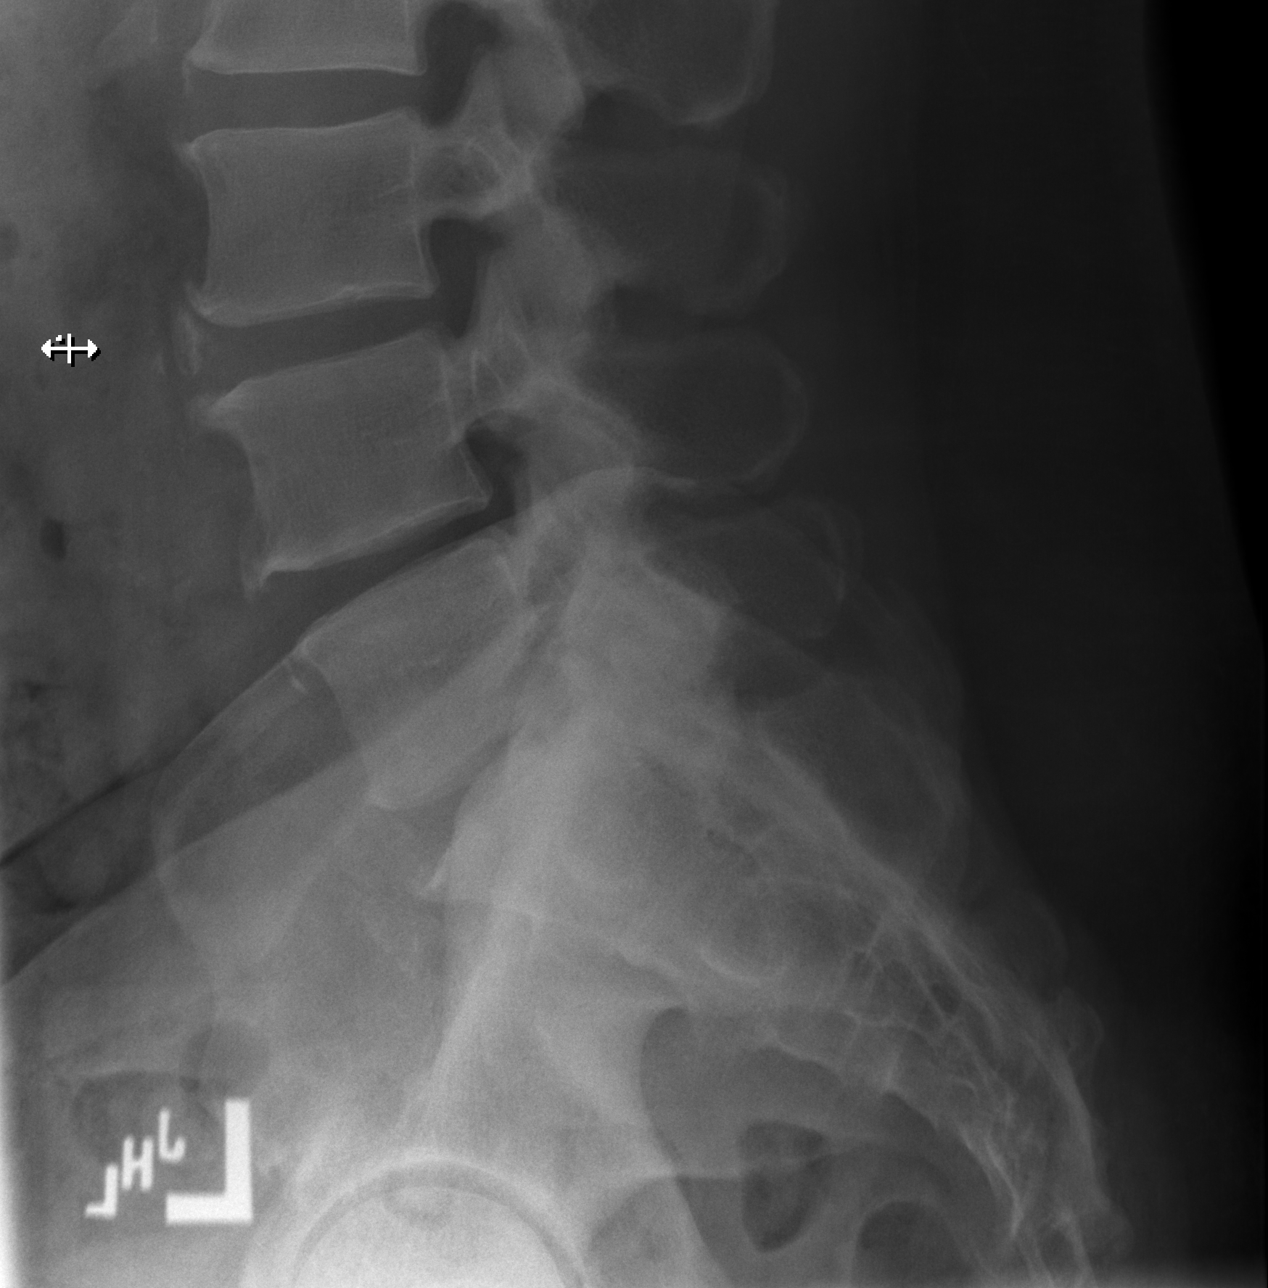

[5 of 5 positions shown; findings below may reference images not displayed]

FINDINGS: Five lumbar type vertebra. There is no acute fracture or subluxation
the lumbar spine. Multilevel mild degenerative changes and anterior
spurring most prominent at L4-L5 and L5-S1. The visualized posterior
elements are intact. L5-S1 facet arthropathy. The soft tissues are
unremarkable.
IMPRESSION: 1. No acute/traumatic lumbar spine pathology.
2. Multilevel mild degenerative changes.

## 2020-10-13 MED ORDER — DICLOFENAC SODIUM 1 % EX GEL: 2.0000 g | Freq: Four times a day (QID) | CUTANEOUS | Status: AC

## 2020-10-13 MED ORDER — DICLOFENAC SODIUM 1 % EX GEL: 2.0000 g | Freq: Four times a day (QID) | CUTANEOUS | Status: DC

## 2020-10-13 MED ORDER — LIDOCAINE 5 % EX PTCH: 1.0000 | MEDICATED_PATCH | CUTANEOUS | 0 refills | Status: DC

## 2020-10-13 MED ORDER — PREDNISONE 10 MG (21) PO TBPK: ORAL_TABLET | Freq: Every day | ORAL | 0 refills | Status: DC

## 2020-10-13 NOTE — ED Notes (Signed)
Pt ambulated to room independently with no issues.

## 2020-10-13 NOTE — ED Triage Notes (Signed)
Patient c/o mid and lower back pain. Patient rates pain 9/10. Patient states he has arthritis in his knees and hips.

## 2020-10-13 NOTE — ED Provider Notes (Signed)
Emergency Medicine Provider Triage Evaluation Note  Ronald Young , a 50 y.o. male  was evaluated in triage.  Pt complains of bilateral anterior hip pain, on left radiates to knee, right side has pain in knee and low back. Pain started 4-5 days ago, no falls or injuries. Taking Tylenol with minimal relief. Takes a lot of BP meds so tried to avoid taking extra medications.   Review of Systems  Positive: Knee and back pain Negative: fever  Physical Exam  BP (!) 142/83 (BP Location: Right Arm)   Pulse 63   Temp 97.6 F (36.4 C)   Resp 17   SpO2 96%  Gen:   Awake, no distress   Resp:  Normal effort  MSK:   Moves extremities without difficulty  Other:    Medical Decision Making  Medically screening exam initiated at 4:52 PM.  Appropriate orders placed.  Ronald Young was informed that the remainder of the evaluation will be completed by another provider, this initial triage assessment does not replace that evaluation, and the importance of remaining in the ED until their evaluation is complete.     Ronald Fend, PA-C 10/13/20 1654    Ronald Kaplan, MD 10/14/20 [phone]

## 2020-10-13 NOTE — ED Provider Notes (Signed)
[facility]-EMERGENCY DEPT Provider Note   CSN: [id number] Arrival date & time: 10/13/20  1543     History Chief Complaint  Patient presents with   Hip Pain   Back Pain    Ronald Young is a 50 y.o. male.  Pt complains of bilateral anterior hip pain, on left radiates to knee, right side has pain in knee and low back. Pain started 4-5 days ago, no falls or injuries. Taking Tylenol with minimal relief. Takes a lot of BP meds so tried to avoid taking extra medications. 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 No abdominal pain, loss of bowel or bladder control, groin numbness.       Past Medical History:  Diagnosis Date   Arthritis    Cardiomyopathy (HCC) 09/22/2014   Hypertension     Patient Active Problem List   Diagnosis Date Noted   Acute on chronic combined systolic and diastolic CHF (congestive heart failure) (HCC) 12/20/2019   Essential hypertension 09/26/2014   Bradycardia 09/26/2014   Cocaine use 09/23/2014   Cardiomyopathy (HCC) 09/22/2014   Hypertensive urgency 09/21/2014   Syncope 09/21/2014   Hypertensive emergency 09/21/2014   ARF (acute renal failure) (HCC) 09/21/2014   Elevated troponin     Past Surgical History:  Procedure Laterality Date   RIGHT/LEFT HEART CATH AND CORONARY ANGIOGRAPHY N/A 12/23/2019   Procedure: RIGHT/LEFT HEART CATH AND CORONARY ANGIOGRAPHY;  Surgeon: Runell Gess, MD;  Location: [facility] INVASIVE CV LAB;  Service: Cardiovascular;  Laterality: N/A;       Family History  Problem Relation Age of Onset   Hypertension Mother    Colon cancer Other     Social History   Tobacco Use   Smoking status: Former    Pack years: 0.00    Types: Cigarettes   Smokeless tobacco: Never  Vaping Use   Vaping Use: Never used  Substance Use Topics   Alcohol use: Not Currently   Drug use: Yes    Types: Marijuana    Home  Medications Prior to Admission medications   Medication Sig Start Date End Date Taking? Authorizing Provider  lidocaine (LIDODERM) 5 % Place 1 patch onto the skin daily. Remove & Discard patch within 12 hours or as directed by MD 10/13/20  Yes Jeannie Fend, PA-C  predniSONE (STERAPRED UNI-PAK 21 TAB) 10 MG (21) TBPK tablet Take by mouth daily. Take 6 tabs by mouth daily  for 2 days, then 5 tabs for 2 days, then 4 tabs for 2 days, then 3 tabs for 2 days, 2 tabs for 2 days, then 1 tab by mouth daily for 2 days 10/13/20  Yes Jeannie Fend, PA-C  acetaminophen-codeine (TYLENOL #3) 300-30 MG tablet Take 1 tablet by mouth every 4 (four) hours as needed for moderate pain or severe pain.    [provider]  carvedilol (COREG) 25 MG tablet Take 1.5 tablets (37.5 mg total) by mouth 2 (two) times daily with a meal. 05/11/20   Little Ishikawa, MD  dapagliflozin propanediol (FARXIGA) 10 MG TABS tablet Take 10 mg by mouth daily.    [provider]  diclofenac Sodium (VOLTAREN) 1 % GEL Apply 2 g topically 4 (four) times daily. 10/13/20   Jeannie Fend, PA-C  famotidine (PEPCID) 10 MG tablet Take 1 tablet (10 mg total) by mouth 2 (two) times daily. 05/11/20   Little Ishikawa,  MD  hydrALAZINE (APRESOLINE) 50 MG tablet Take 2 tablets (100 mg total) by mouth 3 (three) times daily. 05/12/20 05/07/21  Little Ishikawa, MD  isosorbide mononitrate (IMDUR) 30 MG 24 hr tablet Take 1 tablet (30 mg total) by mouth daily. 05/11/20 05/06/21  Little Ishikawa, MD  Multiple Vitamins-Minerals (MULTIVITAMIN WITH MINERALS) tablet Take 1 tablet by mouth daily.    [provider]  sacubitril-valsartan (ENTRESTO) 97-103 MG Take 1 tablet by mouth 2 (two) times daily. 07/30/20   Little Ishikawa, MD  spironolactone (ALDACTONE) 25 MG tablet Take 1 tablet (25 mg total) by mouth daily. 09/10/20   Little Ishikawa, MD    Allergies    Strawberry extract  Review of Systems    Review of Systems  Constitutional:  Negative for chills and fever.  Gastrointestinal:  Negative for abdominal pain, constipation and diarrhea.  Genitourinary:  Negative for decreased urine volume and difficulty urinating.  Musculoskeletal:  Positive for arthralgias and back pain. Negative for gait problem and joint swelling.  Skin:  Negative for rash and wound.  Allergic/Immunologic: Negative for immunocompromised state.  Neurological:  Negative for weakness and numbness.  All other systems reviewed and are negative.  Physical Exam Updated Vital Signs BP (!) 142/83 (BP Location: Right Arm)   Pulse 63   Temp 97.6 F (36.4 C)   Resp 17   Ht 6' (1.829 m)   Wt 127 kg   SpO2 96%   BMI 37.97 kg/m   Physical Exam Vitals and nursing note reviewed.  Constitutional:      General: He is not in acute distress.    Appearance: He is well-developed. He is not diaphoretic.  HENT:     Head: Normocephalic and atraumatic.  Cardiovascular:     Pulses: Normal pulses.  Pulmonary:     Effort: Pulmonary effort is normal.  Abdominal:     Palpations: Abdomen is soft.     Tenderness: There is no abdominal tenderness.  Musculoskeletal:        General: No swelling, tenderness or deformity.     Cervical back: No tenderness or bony tenderness.     Thoracic back: No tenderness or bony tenderness.     Lumbar back: No tenderness or bony tenderness. Negative right straight leg raise test and negative left straight leg raise test.     Right knee: Normal. No swelling, deformity, effusion, erythema or bony tenderness. Normal range of motion. No tenderness.     Left knee: Normal. No swelling, deformity, effusion, erythema or bony tenderness. Normal range of motion. No tenderness.     Right lower leg: No edema.     Left lower leg: No edema.  Skin:    General: Skin is warm and dry.     Findings: No erythema or rash.  Neurological:     Mental Status: He is alert and oriented to person, place, and time.      Sensory: No sensory deficit.     Motor: No weakness.     Gait: Gait normal.  Psychiatric:        Behavior: Behavior normal.    ED Results / Procedures / Treatments   Labs (all labs ordered are listed, but only abnormal results are displayed) Labs Reviewed - No data to display  EKG None  Radiology DG Lumbar Spine Complete  Result Date: 10/13/2020 CLINICAL DATA:  50 year old male with low back pain. EXAM: LUMBAR SPINE - COMPLETE 4+ VIEW COMPARISON:  Lumbar spine radiograph dated 10/15/2003 FINDINGS:  Five lumbar type vertebra. There is no acute fracture or subluxation the lumbar spine. Multilevel mild degenerative changes and anterior spurring most prominent at L4-L5 and L5-S1. The visualized posterior elements are intact. L5-S1 facet arthropathy. The soft tissues are unremarkable. IMPRESSION: 1. No acute/traumatic lumbar spine pathology. 2. Multilevel mild degenerative changes. Electronically Signed   By: Elgie Collard M.D.   On: 10/13/2020 17:41    Procedures Procedures   Medications Ordered in ED Medications - No data to display  ED Course  I have reviewed the triage vital signs and the nursing notes.  Pertinent labs & imaging results that were available during my care of the patient were reviewed by me and considered in my medical decision making (see chart for details).  Clinical Course as of 10/13/20 1851  Tue Oct 13, 2020  [id number] 50 year old male with complaint of low back pain which radiates to his knees.  Occasionally has right knee pain isolated from his back pain.  History of arthritis, no history of injury.  Exam is unremarkable.  X-ray lumbar spine shows multilevel degenerative changes. Plan is to treat with course of prednisone, given prescription for Lidoderm patches as well as topical diclofenac.  Referred to orthopedics for follow-up as needed. [LM]    Clinical Course User Index [LM] Alden Hipp   MDM Rules/Calculators/A&P                            Final Clinical Impression(s) / ED Diagnoses Final diagnoses:  Radicular low back pain  Chronic pain of both knees    Rx / DC Orders ED Discharge Orders          Ordered    predniSONE (STERAPRED UNI-PAK 21 TAB) 10 MG (21) TBPK tablet  Daily        10/13/20 1841    diclofenac Sodium (VOLTAREN) 1 % GEL  4 times daily,   Status:  Discontinued        10/13/20 1841    lidocaine (LIDODERM) 5 %  Every 24 hours        10/13/20 1841    diclofenac Sodium (VOLTAREN) 1 % GEL  4 times daily        10/13/20 1844             Jeannie Fend, PA-C 10/13/20 1851    Lorre Nick, MD 10/15/20 1540

## 2020-10-13 NOTE — Discharge Instructions (Addendum)
Apply Voltaren gel to use on low back as prescribed as needed.  Apply Lidoderm patch as needed as prescribed.  Take prednisone as prescribed and complete the full course. Up with orthopedics, referral given.

## 2020-11-15 NOTE — Progress Notes (Deleted)
Cardiology Office Note:    Date:  11/15/2020   ID:  Ronald Young, DOB 04-22-1971, MRN [id number]  PCP:  Pcp, No  Cardiologist:  Little Ishikawa, MD  Electrophysiologist:  None   Referring MD: No ref. provider found   No chief complaint on file.   History of Present Illness:    Ronald Young is a 50 y.o. male with a hx of nonischemic cardiomyopathy, mild bilateral carotid artery disease, hypertension, cocaine use who presents for follow-up.  He was admitted with decompensated heart failure and hypertensive urgency in August 2021, had been off his medications for years.  Echocardiogram 12/21/2019 showed LVEF less than 20% with global hypokinesis, moderate LVH, grade 3 diastolic dysfunction, severe RV dysfunction, mild MR, mild dilatation of the ascending aorta measuring 40 mm.  RHC/LHC on 12/23/2019 showed normal coronary arteries and filling pressures.  He was discharged on losartan 50 mg daily, carvedilol 25 mg twice daily, spironolactone 25 mg daily.  He was discharged on as needed Lasix, advised to monitor daily weights.     Repeat echocardiogram on 06/08/2020 showed significant improvement in LV systolic function (EF 45 to 50%), moderate LVH, grade 2 diastolic dysfunction, normal RV function, no significant valvular disease.  Since last clinic visit,   he reports that he has been doing well. He had palpitations about once per month, lasts few seconds but this has since resolved. He has questions about his prescribed medications. BP improved, had recent 130/80. He denies any chest pain and tightness, dyspnea, lightheadedness, syncope, or  lower extremity edema. He is smoking  marijuana 3 times a week but no cigarettes. No cocaine. He usually walks 3 times a week but now the weather has improved he is going to increase this.   Wt Readings from Last 3 Encounters:  10/13/20 280 lb (127 kg)  09/10/20 283 lb (128.4 kg)  07/31/20 277 lb (125.6 kg)   BP Readings from Last 3 Encounters:   10/13/20 (!) 142/83  09/10/20 (!) 156/100  07/31/20 132/90   Past Medical History:  Diagnosis Date   Arthritis    Cardiomyopathy (HCC) 09/22/2014   Hypertension     Past Surgical History:  Procedure Laterality Date   RIGHT/LEFT HEART CATH AND CORONARY ANGIOGRAPHY N/A 12/23/2019   Procedure: RIGHT/LEFT HEART CATH AND CORONARY ANGIOGRAPHY;  Surgeon: Runell Gess, MD;  Location: [facility];  Service: Cardiovascular;  Laterality: N/A;   Current Medications: No outpatient medications have been marked as taking for the 11/18/20 encounter (Appointment) with Little Ishikawa, MD.     Allergies:   Strawberry extract   Social History   Socioeconomic History   Marital status: Single    Spouse name: Not on file   Number of children: Not on file   Years of education: Not on file   Highest education level: Not on file  Occupational History   Not on file  Tobacco Use   Smoking status: Former    Types: Cigarettes   Smokeless tobacco: Never  Vaping Use   Vaping Use: Never used  Substance and Sexual Activity   Alcohol use: Not Currently   Drug use: Yes    Types: Marijuana   Sexual activity: Not on file  Other Topics Concern   Not on file  Social History Narrative   Not on file   Social Determinants of Health   Financial Resource Strain: Medium Risk   Difficulty of Paying Living Expenses: Somewhat hard  Food Insecurity: Not on file  Transportation Needs: No Regulatory affairs officer (Medical): No   Lack of Transportation (Non-Medical): No  Physical Activity: Not on file  Stress: Not on file  Social Connections: Not on file     Family History: [name] family history includes Colon cancer in an other family member; Hypertension in his mother.  ROS:   Please see the history of present illness.     All other systems reviewed and are negative.  EKGs/Labs/Other Studies Reviewed:    The following studies were reviewed  today:  EKG:   4/22- EKG was not ordered today.  2/22- NSR with first degree AV block, rate 74, TWI in I, II, aVL, V4-6  Recent Labs: 12/20/2019: ALT 43 12/24/2019: Hemoglobin 11.1; Magnesium 2.2; Platelets 326 04/06/2020: BNP 122.5 09/25/2020: BUN 19; Creatinine, Ser 1.40; Potassium 4.4; Sodium 141  Recent Lipid Panel    Component Value Date/Time   CHOL 183 12/21/2019 0443   TRIG 67 12/21/2019 0443   HDL 28 (L) 12/21/2019 0443   CHOLHDL 6.5 12/21/2019 0443   VLDL 13 12/21/2019 0443   LDLCALC 142 (H) 12/21/2019 0443    Physical Exam:    VS:  There were no vitals taken for this visit.    Wt Readings from Last 3 Encounters:  10/13/20 280 lb (127 kg)  09/10/20 283 lb (128.4 kg)  07/31/20 277 lb (125.6 kg)     GEN: Well nourished, well developed in no acute distress HEENT: Normal NECK: No JVD; No carotid bruits CARDIAC: RRR, no murmurs, rubs, gallops RESPIRATORY:  Clear to auscultation without rales, wheezing or rhonchi  ABDOMEN: Soft, non-tender, non-distended MUSCULOSKELETAL:  No edema; No deformity  SKIN: Warm and dry NEUROLOGIC:  Alert and oriented x 3 PSYCHIATRIC:  Normal affect   ASSESSMENT:    No diagnosis found.  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  RHC/LHC on 12/23/2019 showed normal coronary arteries and filling pressures.  Nonischemic cardiomyopathy, suspect due to uncontrolled hypertension.  Repeat echocardiogram on 06/08/2020 showed significant improvement in LV systolic function (EF 45 to 50%), moderate LVH, grade 2 diastolic dysfunction, normal RV function, no significant valvular disease. -Continue Entresto 97-103 mg twice daily -Continue carvedilol 37.5 mg twice daily -Continue spironolactone 25 mg daily -Continue hydralazine  100 mg 3 times daily and Imdur 30 mg daily -Continue Farxiga 10 mg daily -Recommend checking BMP 1 week after starting [place] and increased dose of Entresto. -As needed Lasix, currently appears euvolemic.  Do not suspect he will need Lasix with starting Farxiga -Ideally would check cardiac MRI to evaluate etiology of nonischemic cardiomyopathy.  However holding off on further testing for now as he has applied for Medicaid and awaiting results.  Would also consider Zio patch to quantify PVC burden when able.  Hypertension: Continue entresto, carvedilol, spironolactone, hydralazine/Imdur as above.  Cocaine use: Denies recent use, UDS negative on recent admission.  Encouraged continued cessation  Marijuana use: encourage cessation  GERD: Continue famotidine  Snoring: Suspect undiagnosed OSA contributing to difficult to control hypertension.  He is in the process of applying for Medicaid, will plan sleep study once that is completed  RTC in ***   Medication Adjustments/Labs and Tests Ordered: Current medicines are reviewed at length with the patient today.  Concerns regarding medicines are outlined above.  No  orders of the defined types were placed in this encounter.  No orders of the defined types were placed in this encounter.   There are no Patient Instructions on file for this visit.   Signed, Little Ishikawa, MD  11/15/2020 3:58 PM    [facility]

## 2020-11-16 NOTE — Progress Notes (Addendum)
Cardiology Office Note:    Date:  11/18/2020   ID:  Ronald Young, DOB 12-29-1970, MRN [id number]  PCP:  Pcp, No  Cardiologist:  Little Ishikawa, MD  Electrophysiologist:  None   Referring MD: No ref. provider found   Chief Complaint  Patient presents with   Follow-up    3 months.   Congestive Heart Failure     History of Present Illness:    Ronald Young is a 50 y.o. male with a hx of nonischemic cardiomyopathy, mild bilateral carotid artery disease, hypertension, cocaine use who presents for follow-up.  He was admitted with decompensated heart failure and hypertensive urgency in August 2021, had been off his medications for years.  Echocardiogram 12/21/2019 showed LVEF less than 20% with global hypokinesis, moderate LVH, grade 3 diastolic dysfunction, severe RV dysfunction, mild MR, mild dilatation of the ascending aorta measuring 40 mm.  RHC/LHC on 12/23/2019 showed normal coronary arteries and filling pressures.  He was discharged on losartan 50 mg daily, carvedilol 25 mg twice daily, spironolactone 25 mg daily.  He was discharged on as needed Lasix, advised to monitor daily weights.     Repeat echocardiogram on 06/08/2020 showed significant improvement in LV systolic function (EF 45 to 50%), moderate LVH, grade 2 diastolic dysfunction, normal RV function, no significant valvular disease.   Since last clinic visit, patient is feeling okay overall. His systolic blood pressure at home ranges around 140s. He includes he has headaches and believes its from his blood pressure. He has shortness of breath when walking up the stairs and occasional palpitations that lasts for a few hours every 2-3 times a month. Denies chest pains, syncope, LE edema or lightheadedness. He does not intake any salt in his diet. Smokes marijuana 3 times a week. Also says he has sinus issues and uses Vicks twice a day.    Wt Readings from Last 3 Encounters:  11/18/20 276 lb (125.2 kg)  10/13/20 280 lb (127  kg)  09/10/20 283 lb (128.4 kg)   BP Readings from Last 3 Encounters:  11/18/20 138/90  10/13/20 (!) 142/83  09/10/20 (!) 156/100   Past Medical History:  Diagnosis Date   Arthritis    Cardiomyopathy (HCC) 09/22/2014   Hypertension     Past Surgical History:  Procedure Laterality Date   RIGHT/LEFT HEART CATH AND CORONARY ANGIOGRAPHY N/A 12/23/2019   Procedure: RIGHT/LEFT HEART CATH AND CORONARY ANGIOGRAPHY;  Surgeon: Runell Gess, MD;  Location: [facility];  Service: Cardiovascular;  Laterality: N/A;   Current Medications: Current Meds  Medication Sig   acetaminophen-codeine (TYLENOL #3) 300-30 MG tablet Take 1 tablet by mouth every 4 (four) hours as needed for moderate pain or severe pain.   carvedilol (COREG) 25 MG tablet Take 1.5 tablets (37.5 mg total) by mouth 2 (two) times daily with a meal.   dapagliflozin propanediol (FARXIGA) 10 MG TABS tablet Take 10 mg by mouth daily.   diclofenac Sodium (VOLTAREN) 1 % GEL Apply 2 g topically 4 (four) times daily.   famotidine (PEPCID) 10 MG tablet Take 1 tablet (10 mg total) by mouth 2 (two) times daily.   hydrALAZINE (APRESOLINE) 50 MG tablet Take 2 tablets (100 mg total) by mouth 3 (three) times daily.   isosorbide mononitrate (IMDUR) 30 MG 24 hr tablet Take 1 tablet (30 mg total) by mouth daily.   lidocaine (LIDODERM) 5 % Place 1 patch onto the skin daily. Remove & Discard patch within 12 hours or as directed by  MD   Multiple Vitamins-Minerals (MULTIVITAMIN WITH MINERALS) tablet Take 1 tablet by mouth daily.   sacubitril-valsartan (ENTRESTO) 97-103 MG Take 1 tablet by mouth 2 (two) times daily.   spironolactone (ALDACTONE) 25 MG tablet Take 1 tablet (25 mg total) by mouth daily.   [DISCONTINUED] predniSONE (STERAPRED UNI-PAK 21 TAB) 10 MG (21) TBPK tablet Take by mouth daily. Take 6 tabs by mouth daily  for 2 days, then 5 tabs for 2 days, then 4 tabs for 2 days, then 3 tabs for 2 days, 2 tabs for 2 days, then 1 tab by mouth  daily for 2 days     Allergies:   Strawberry extract   Social History   Socioeconomic History   Marital status: Single    Spouse name: Not on file   Number of children: Not on file   Years of education: Not on file   Highest education level: Not on file  Occupational History   Not on file  Tobacco Use   Smoking status: Former    Types: Cigarettes   Smokeless tobacco: Never  Vaping Use   Vaping Use: Never used  Substance and Sexual Activity   Alcohol use: Not Currently   Drug use: Yes    Types: Marijuana   Sexual activity: Not on file  Other Topics Concern   Not on file  Social History Narrative   Not on file   Social Determinants of Health   Financial Resource Strain: Medium Risk   Difficulty of Paying Living Expenses: Somewhat hard  Food Insecurity: Not on file  Transportation Needs: No Transportation Needs   Lack of Transportation (Medical): No   Lack of Transportation (Non-Medical): No  Physical Activity: Not on file  Stress: Not on file  Social Connections: Not on file     Family History: [name] family history includes Colon cancer in an other family member; Hypertension in his mother.  ROS:   Please see the history of present illness.     (+) palpitations  (+)Shortness of breath  (+) headaches  (+) Sinus problems All other systems reviewed and are negative.  EKGs/Labs/Other Studies Reviewed:    The following studies were reviewed today: ECHO 06/08/2020:   IMPRESSIONS    1. Left ventricular ejection fraction, by estimation, is 45 to 50%. The  left ventricle has mildly decreased function. The left ventricle  demonstrates global hypokinesis. There is moderate concentric left  ventricular hypertrophy. Left ventricular  diastolic parameters are consistent with Grade II diastolic dysfunction  (pseudonormalization). Elevated left atrial pressure.   2. Right ventricular systolic function is normal. The right ventricular  size is normal.  Tricuspid regurgitation signal is inadequate for assessing  PA pressure.   3. Left atrial size was mild to moderately dilated.   4. The mitral valve is grossly normal. Trivial mitral valve  regurgitation. No evidence of mitral stenosis.   5. The aortic valve is tricuspid. Aortic valve regurgitation is not  visualized. No aortic stenosis is present.   6. The inferior vena cava is normal in size with greater than 50%  respiratory variability, suggesting right atrial pressure of 3 mmHg.   R/L HEART CATH 12/23/2019 IMPRESSION: Mr. Lando has normal coronary arteries, elevated LVEDP and fairly normal filling pressures. He has a nonischemic cardiomyopathy probably hypertensive in nature from failure to take his prescribed medications. The antecubital venous sheath was removed and pressure held. The radial sheath was removed and a TR band was placed on the right wrist to achieve  patent hemostasis. The patient left lab in stable condition. He'll be transferred back to [facility] long hospital once the TR band is off and resume guideline directed optimal medical treatment. He left the lab in stable condition.  ECHO 12/21/19  IMPRESSIONS    1. Left ventricular ejection fraction, by estimation, is <20%. The left  ventricle has severely decreased function. The left ventricle demonstrates  global hypokinesis. The left ventricular internal cavity size was  moderately dilated. There is moderate  concentric left ventricular hypertrophy. Left ventricular diastolic  parameters are consistent with Grade III diastolic dysfunction  (restrictive).   2. Right ventricular systolic function is severely reduced. The right  ventricular size is moderately enlarged.   3. Left atrial size was severely dilated.   4. Right atrial size was mildly dilated.   5. The mitral valve is normal in structure. Mild mitral valve  regurgitation. No evidence of mitral stenosis.   6. The aortic valve is normal in structure. Aortic valve  regurgitation is  not visualized. No aortic stenosis is present.   7. There is mild dilatation of the ascending aorta measuring 40 mm.   8. The inferior vena cava is normal in size with greater than 50%  respiratory variability, suggesting right atrial pressure of 3 mmHg.   EKG:   07/22: EKG was not ordered today.  4/22- EKG was not ordered today.  2/22- NSR with first degree AV block, rate 74, TWI in I, II, aVL, V4-6  Recent Labs: 12/20/2019: ALT 43 12/24/2019: Hemoglobin 11.1; Magnesium 2.2; Platelets 326 04/06/2020: BNP 122.5 09/25/2020: BUN 19; Creatinine, Ser 1.40; Potassium 4.4; Sodium 141  Recent Lipid Panel    Component Value Date/Time   CHOL 183 12/21/2019 0443   TRIG 67 12/21/2019 0443   HDL 28 (L) 12/21/2019 0443   CHOLHDL 6.5 12/21/2019 0443   VLDL 13 12/21/2019 0443   LDLCALC 142 (H) 12/21/2019 0443    Physical Exam:    VS:  BP 138/90 (BP Location: Left Arm, Patient Position: Sitting, Cuff Size: Large)   Pulse 64   Ht 6' (1.829 m)   Wt 276 lb (125.2 kg)   BMI 37.43 kg/m     Wt Readings from Last 3 Encounters:  11/18/20 276 lb (125.2 kg)  10/13/20 280 lb (127 kg)  09/10/20 283 lb (128.4 kg)     GEN: Well nourished, well developed in no acute distress HEENT: Normal NECK: No JVD; No carotid bruits CARDIAC: RRR, no murmurs, rubs, gallops RESPIRATORY:  Clear to auscultation without rales, wheezing or rhonchi  ABDOMEN: Soft, non-tender, non-distended MUSCULOSKELETAL:  No edema; No deformity  SKIN: Warm and dry NEUROLOGIC:  Alert and oriented x 3 PSYCHIATRIC:  Normal affect   ASSESSMENT:    1. Chronic combined systolic and diastolic heart failure (HCC)   2. Essential hypertension   3. Snoring     PLAN:    Chronic combined systolic and diastolic heart failure: Echocardiogram 12/21/2019 showed LVEF less than 20% with global hypokinesis, moderate LVH, grade 3 diastolic dysfunction, severe RV dysfunction, mild MR, mild dilatation of the ascending aorta  measuring 40 mm.  RHC/LHC on 12/23/2019 showed normal coronary arteries and filling pressures.  Nonischemic cardiomyopathy, suspect due to uncontrolled hypertension.  Repeat echocardiogram on 06/08/2020 showed significant improvement in LV systolic function (EF 45 to 50%), moderate LVH, grade 2 diastolic dysfunction, normal RV function, no significant valvular disease. -Continue Entresto 97-103 mg twice daily -Continue carvedilol 37.5 mg twice daily -Continue spironolactone 25 mg daily -Continue hydralazine  100 mg 3 times daily and Imdur 30 mg daily -Continue Farxiga 10 mg daily -Recommend checking BMP  -As needed Lasix, currently appears euvolemic. -Ideally would check cardiac MRI to evaluate etiology of nonischemic cardiomyopathy.  However holding off on further testing for now as he has applied for Medicaid.  Would also consider Zio patch to quantify PVC burden when able.  Hypertension: Continue entresto, carvedilol, spironolactone, hydralazine/Imdur as above.  He has been using oxymetazoline nasal spray multiple times daily for sinus congestion.  This could be raising BP, recommend establishing with PCP to discuss alternative for his sinus issues  Cocaine use: Denies recent use, UDS negative on recent admission.  Encouraged continued cessation  Marijuana use: encourage cessation  GERD: Continue famotidine  Snoring: Suspect undiagnosed OSA contributing to difficult to control hypertension.  He is in the process of applying for Medicaid, will plan sleep study once has insurance  RTC in 4 months   Medication Adjustments/Labs and Tests Ordered: Current medicines are reviewed at length with the patient today.  Concerns regarding medicines are outlined above.  Orders Placed This Encounter  Procedures   Basic metabolic panel    No orders of the defined types were placed in this encounter.   Patient Instructions  Medication Instructions:  Your physician recommends that you continue on  your current medications as directed. Please refer to the Current Medication list given to you today.  *If you need a refill on your cardiac medications before your next appointment, please call your pharmacy*   Lab Work: BMET today  If you have labs (blood work) drawn today and your tests are completely normal, you will receive your results only by: MyChart Message (if you have MyChart) OR A paper copy in the mail If you have any lab test that is abnormal or we need to change your treatment, we will call you to review the results.  Follow-Up: At [facility], you and your health needs are our priority.  As part of our continuing mission to provide you with exceptional heart care, we have created designated Provider Care Teams.  These Care Teams include your primary Cardiologist (physician) and Advanced Practice Providers (APPs -  Physician Assistants and Nurse Practitioners) who all work together to provide you with the care you need, when you need it.  We recommend signing up for the patient portal called "MyChart".  Sign up information is provided on this After Visit Summary.  MyChart is used to connect with patients for Virtual Visits (Telemedicine).  Patients are able to view lab/test results, encounter notes, upcoming appointments, etc.  Non-urgent messages can be sent to your provider as well.   To learn more about what you can do with MyChart, go to [email or web address].    Your next appointment:   4 month(s)  The format for your next appointment:   In Person  Provider:   Epifanio Lesches, MD   Other Instructions Please call list of primary care doctors to get established.    I,Jada Bradford,acting as a Neurosurgeon for Little Ishikawa, MD.,have documented all relevant documentation on the behalf of Little Ishikawa, MD,as directed by  Little Ishikawa, MD while in the presence of Little Ishikawa, MD.  I, Little Ishikawa, MD, have  reviewed all documentation for this visit. The documentation on 11/18/20 for the exam, diagnosis, procedures, and orders are all accurate and complete.  Signed, Little Ishikawa, MD  11/18/2020 2:25 PM    [facility]

## 2020-11-18 ENCOUNTER — Ambulatory Visit (INDEPENDENT_AMBULATORY_CARE_PROVIDER_SITE_OTHER): Payer: Self-pay | Admitting: Cardiology

## 2020-11-18 ENCOUNTER — Other Ambulatory Visit: Payer: Self-pay

## 2020-11-18 VITALS — BP 138/90 | HR 64 | Ht 72.0 in | Wt 276.0 lb

## 2020-11-18 DIAGNOSIS — I10 Essential (primary) hypertension: Secondary | ICD-10-CM

## 2020-11-18 DIAGNOSIS — R0683 Snoring: Secondary | ICD-10-CM

## 2020-11-18 DIAGNOSIS — I5042 Chronic combined systolic (congestive) and diastolic (congestive) heart failure: Secondary | ICD-10-CM

## 2020-11-18 NOTE — Progress Notes (Signed)
Met with pt prior to end of appointment at [facility] today. Re-introduced self, role, reason for visit. Pt shares unfortunately he was denied his appeal for Medicaid and disability. Pt denies any additional concerns surrounding housing or utilities (lives currently with his mother), and is receiving SNAP. He plans to speak with a lawyer about his options. Since he has been denied he is eligible for additional assistance programs. Pt first provided with a copy of the [employer] clinics. He has been to [facility] before but does not have an established PCP, encouraged him to reach out to that clinic to see how to become established.   I then provided pt with copy of [employer] and [employer] application. We went over both, he is aware of needed documents. I will mail him letter of support to be notarized, I will fax in his signed [id number]. Pt has my number for any additional questions/concerns. Encouraged to call me Monday when I am back in the office.   [name], MSW, [place]  [phone]

## 2020-11-18 NOTE — Patient Instructions (Signed)
Medication Instructions:  Your physician recommends that you continue on your current medications as directed. Please refer to the Current Medication list given to you today.  *If you need a refill on your cardiac medications before your next appointment, please call your pharmacy*   Lab Work: BMET today  If you have labs (blood work) drawn today and your tests are completely normal, you will receive your results only by: MyChart Message (if you have MyChart) OR A paper copy in the mail If you have any lab test that is abnormal or we need to change your treatment, we will call you to review the results.  Follow-Up: At [facility], you and your health needs are our priority.  As part of our continuing mission to provide you with exceptional heart care, we have created designated Provider Care Teams.  These Care Teams include your primary Cardiologist (physician) and Advanced Practice Providers (APPs -  Physician Assistants and Nurse Practitioners) who all work together to provide you with the care you need, when you need it.  We recommend signing up for the patient portal called "MyChart".  Sign up information is provided on this After Visit Summary.  MyChart is used to connect with patients for Virtual Visits (Telemedicine).  Patients are able to view lab/test results, encounter notes, upcoming appointments, etc.  Non-urgent messages can be sent to your provider as well.   To learn more about what you can do with MyChart, go to [email or web address].    Your next appointment:   4 month(s)  The format for your next appointment:   In Person  Provider:   Epifanio Lesches, MD   Other Instructions Please call list of primary care doctors to get established.

## 2020-11-19 LAB — BASIC METABOLIC PANEL
BUN/Creatinine Ratio: 14 (ref 9–20)
BUN: 16 mg/dL (ref 6–24)
CO2: 20 mmol/L (ref 20–29)
Calcium: 9.4 mg/dL (ref 8.7–10.2)
Chloride: 106 mmol/L (ref 96–106)
Creatinine, Ser: 1.14 mg/dL (ref 0.76–1.27)
Glucose: 98 mg/dL (ref 65–99)
Potassium: 4.2 mmol/L (ref 3.5–5.2)
Sodium: 143 mmol/L (ref 134–144)
eGFR: 79 mL/min/{1.73_m2} (ref >59)

## 2020-11-20 ENCOUNTER — Encounter: Payer: Self-pay | Admitting: *Deleted

## 2020-11-28 ENCOUNTER — Encounter (HOSPITAL_COMMUNITY): Payer: Self-pay | Admitting: Emergency Medicine

## 2020-11-28 ENCOUNTER — Emergency Department (HOSPITAL_COMMUNITY)
Admission: EM | Admit: 2020-11-28 | Discharge: 2020-11-28 | Disposition: A | Discharge status: Home / Self Care | Payer: Self-pay | Attending: Emergency Medicine | Admitting: Emergency Medicine

## 2020-11-28 ENCOUNTER — Other Ambulatory Visit: Payer: Self-pay

## 2020-11-28 ENCOUNTER — Emergency Department (HOSPITAL_COMMUNITY): Payer: Self-pay

## 2020-11-28 DIAGNOSIS — I639 Cerebral infarction, unspecified: Secondary | ICD-10-CM | POA: Insufficient documentation

## 2020-11-28 DIAGNOSIS — I110 Hypertensive heart disease with heart failure: Secondary | ICD-10-CM | POA: Insufficient documentation

## 2020-11-28 DIAGNOSIS — I5043 Acute on chronic combined systolic (congestive) and diastolic (congestive) heart failure: Secondary | ICD-10-CM | POA: Insufficient documentation

## 2020-11-28 DIAGNOSIS — R0981 Nasal congestion: Secondary | ICD-10-CM

## 2020-11-28 DIAGNOSIS — Z8673 Personal history of transient ischemic attack (TIA), and cerebral infarction without residual deficits: Secondary | ICD-10-CM

## 2020-11-28 DIAGNOSIS — I6930 Unspecified sequelae of cerebral infarction: Secondary | ICD-10-CM

## 2020-11-28 DIAGNOSIS — Z87891 Personal history of nicotine dependence: Secondary | ICD-10-CM | POA: Insufficient documentation

## 2020-11-28 DIAGNOSIS — Z79899 Other long term (current) drug therapy: Secondary | ICD-10-CM | POA: Insufficient documentation

## 2020-11-28 DIAGNOSIS — I10 Essential (primary) hypertension: Secondary | ICD-10-CM

## 2020-11-28 DIAGNOSIS — Z7982 Long term (current) use of aspirin: Secondary | ICD-10-CM | POA: Insufficient documentation

## 2020-11-28 DIAGNOSIS — Z20822 Contact with and (suspected) exposure to covid-19: Secondary | ICD-10-CM | POA: Insufficient documentation

## 2020-11-28 LAB — RESP PANEL BY RT-PCR (FLU A&B, COVID) ARPGX2
Influenza A by PCR: NEGATIVE
Influenza B by PCR: NEGATIVE
SARS Coronavirus 2 by RT PCR: NEGATIVE

## 2020-11-28 IMAGING — MR MR HEAD W/O CM
10 series · 48 of 48 positions shown · non-contrast
Comparison: Prior head CT examinations [DATE] and earlier.

CLINICAL DATA: Neuro deficit, acute, stroke suspected. Additional
history provided: Headache for 1 week.

EXAM:
MRI HEAD WITHOUT CONTRAST
TECHNIQUE: Multiplanar, multiecho pulse sequences of the brain and surrounding
structures were obtained without intravenous contrast.

[Series 5: DWI · axial · 3.0mm · 1.36mm/px · z∈[-42,+113]mm · 9 of 108 slices shown (1 of 2)]
[im 1/108]
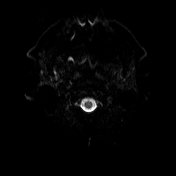
[im 14/108]
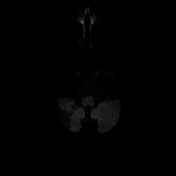
[im 27/108]
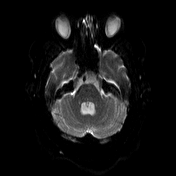
[im 41/108]
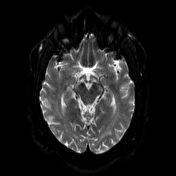
[im 54/108]
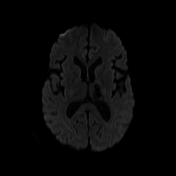
[im 67/108]
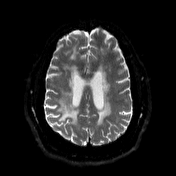
[im 81/108]
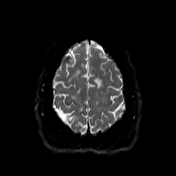
[im 94/108]
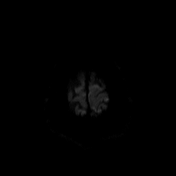
[im 108/108]
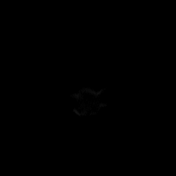

[Series 6: DWI · axial · 3.0mm · 1.36mm/px · z∈[-42,+113]mm · 5 of 54 slices shown (2 of 2)]
[im 1/54]
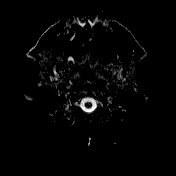
[im 14/54]
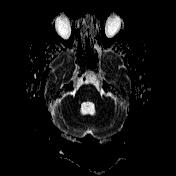
[im 27/54]
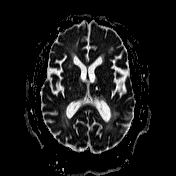
[im 40/54]
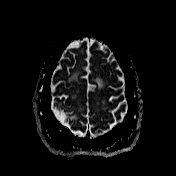
[im 54/54]
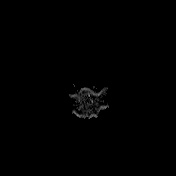

[Series 7: T1 · sagittal · 5.0mm · 0.75mm/px · 2 of 28 slices shown (1 of 2)]
[im 1/28]
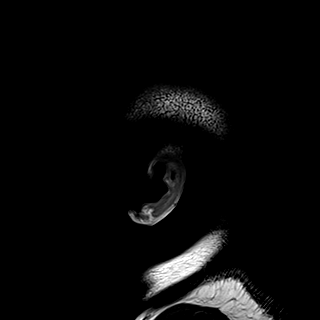
[im 28/28]
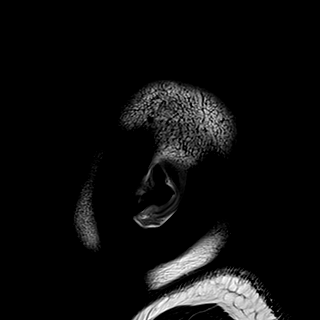

[Series 8: T2 · axial · 5.0mm · 0.62mm/px · z∈[-44,+114]mm · 2 of 26 slices shown (1 of 2)]
[im 1/26]
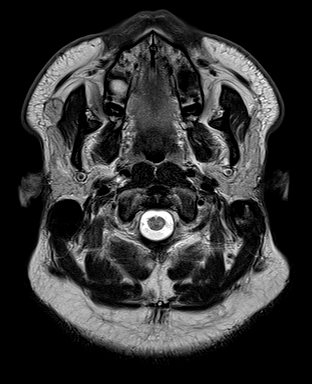
[im 26/26]
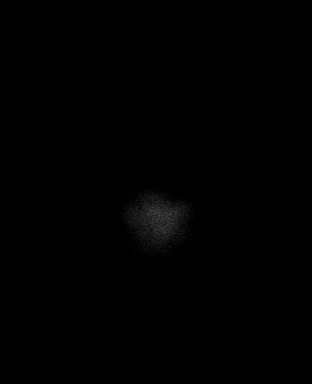

[Series 9: swi_images · axial · 3.0mm · 0.75mm/px · z∈[-45,+115]mm · 4 of 56 slices shown]
[im 1/56]
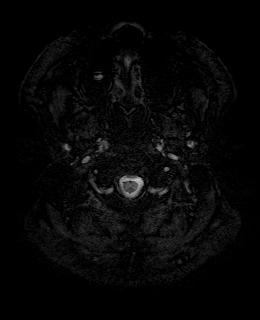
[im 19/56]
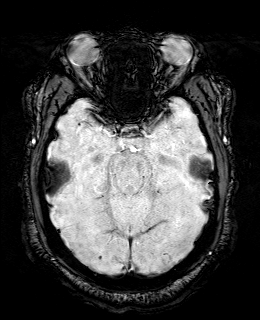
[im 37/56]
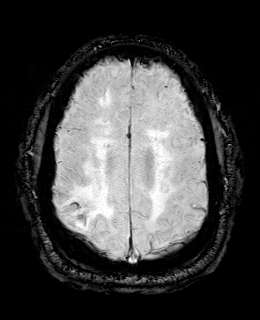
[im 56/56]
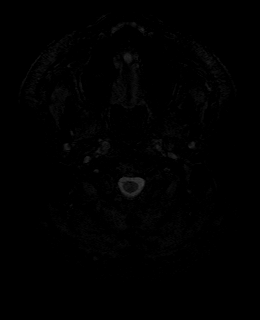

[Series 11: FLAIR · axial · 3.0mm · 0.75mm/px · z∈[-45,+115]mm · 4 of 56 slices shown]
[im 1/56]
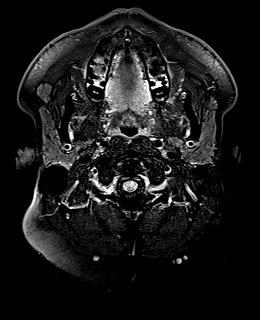
[im 19/56]
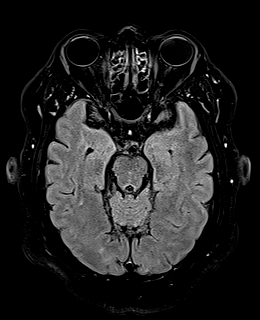
[im 37/56]
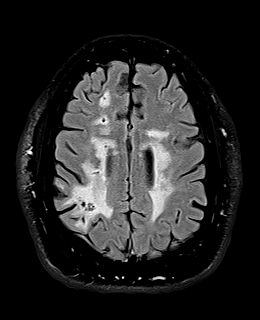
[im 56/56]
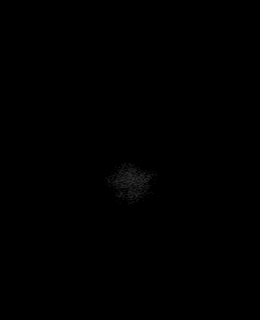

[Series 12: T1 · axial · 1.0mm · 0.94mm/px · z∈[-50,+120]mm · 13 of 176 slices shown (2 of 2)]
[im 1/176]
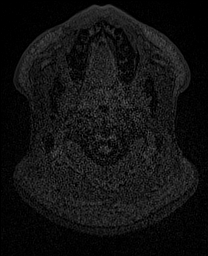
[im 15/176]
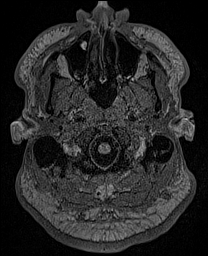
[im 30/176]
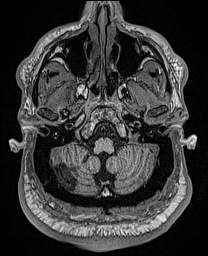
[im 44/176]
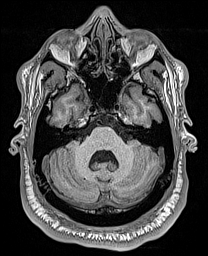
[im 59/176]
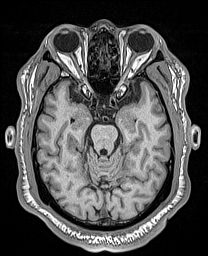
[im 73/176]
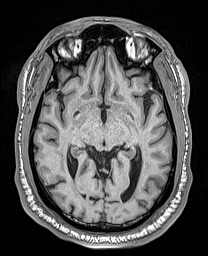
[im 88/176]
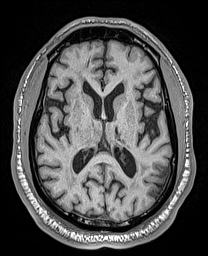
[im 103/176]
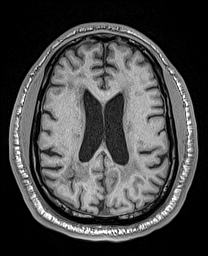
[im 117/176]
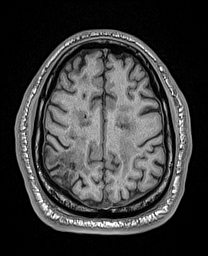
[im 132/176]
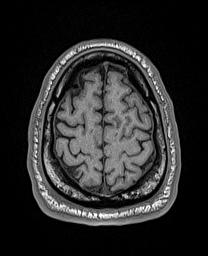
[im 146/176]
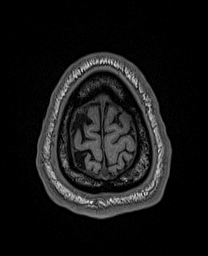
[im 161/176]
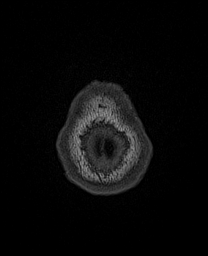
[im 176/176]
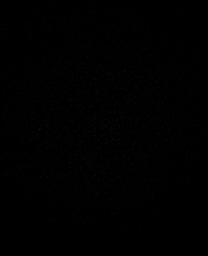

[Series 13: cor dwi_tracew · coronal · 5.0mm · 1.53mm/px · 5 of 66 slices shown]
[im 1/66]
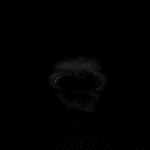
[im 17/66]
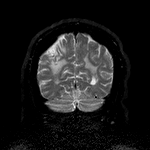
[im 33/66]
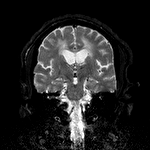
[im 49/66]
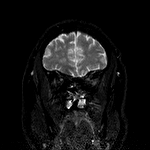
[im 66/66]
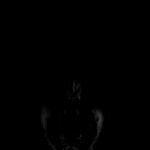

[Series 14: cor dwi_adc · coronal · 5.0mm · 1.53mm/px · 2 of 33 slices shown]
[im 1/33]
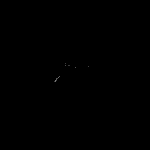
[im 33/33]
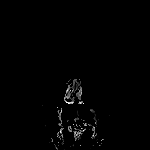

[Series 15: T2 · coronal · 5.0mm · 0.57mm/px · 2 of 33 slices shown (2 of 2)]
[im 1/33]
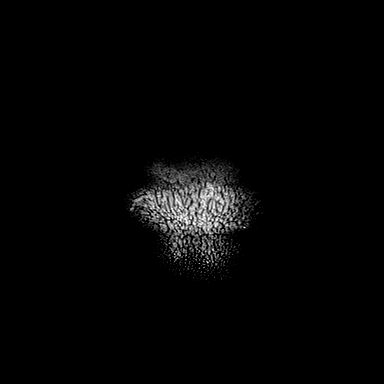
[im 33/33]
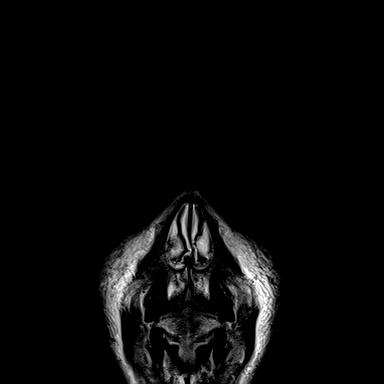

[48 of 48 positions shown; findings below may reference images not displayed]

FINDINGS: Brain:

Cerebral volume is normal.

Chronic cortical/subcortical infarct within the right parietal lobe.
Associated chronic hemosiderin deposition at this site.

Small chronic cortical infarcts within the bilateral temporal
occipital lobes. Small amount of chronic hemosiderin deposition
associated with a small chronic cortical infarct within the left
temporal occipital lobes.

Multiple chronic lacunar infarcts within the bilateral cerebral
hemispheric white matter, basal ganglia and thalami. Background
moderate/severe chronic small vessel ischemic disease within the
cerebral white matter, significantly advanced for age.

Chronic infarct within the inferior right cerebellar hemisphere.
Small chronic infarct also present within the left cerebellar
hemisphere.

Chronic hemosiderin deposition associated with a chronic infarct
within the left thalamus.

Numerous supratentorial and infratentorial chronic microhemorrhages
elsewhere within the brain, nonspecific but likely reflecting
sequela of hypertensive microangiopathy.

There is no acute infarct.

No evidence of an intracranial mass.

No extra-axial fluid collection.

No midline shift.

Vascular: Expected proximal arterial flow voids.

Skull and upper cervical spine: No focal marrow lesion.

Sinuses/Orbits: Visualized orbits show no acute finding. Small right
maxillary sinus mucous retention cyst. Mild partial T2 hyperintense
opacification of the left ethmoid air cells.
IMPRESSION: No evidence of acute intracranial abnormality.

Extensive and significantly age-advanced chronic ischemic changes
with multiple chronic cortically based and lacunar infarcts, as
described. Chronic hemorrhage associated with several of these
infarcts.

Chronic infarcts also present within the bilateral cerebellar
hemispheres.

Numerous supratentorial and infratentorial chronic microhemorrhages,
nonspecific but likely reflecting sequela of hypertensive
microangiopathy.

Paranasal sinus disease, as described.

## 2020-11-28 IMAGING — CT CT HEAD W/O CM
3 series · 14 of 47 positions shown, 16 images · non-contrast
Comparison: Head CT [DATE].

CLINICAL DATA: Headache and sinus pressure for over 1 week.

EXAM:
CT HEAD WITHOUT CONTRAST
TECHNIQUE: Contiguous axial images were obtained from the base of the skull
through the vertex without intravenous contrast.

[Series 3: head wo · axial · 0.45mm/px · z∈[+1782,+1917]mm · 8 of 33 slices shown, 10 images]
[im 3/33  brain]
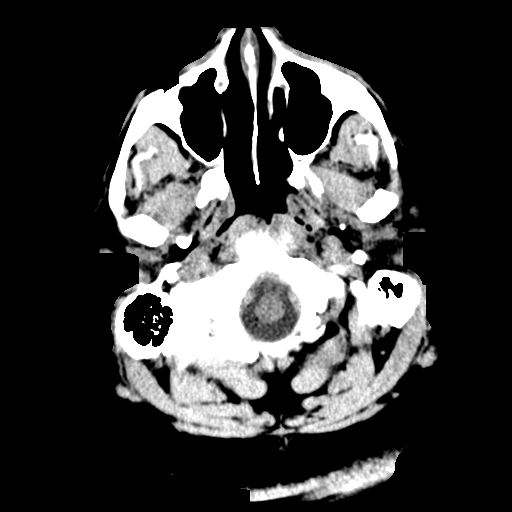
[im 3/33  bone]
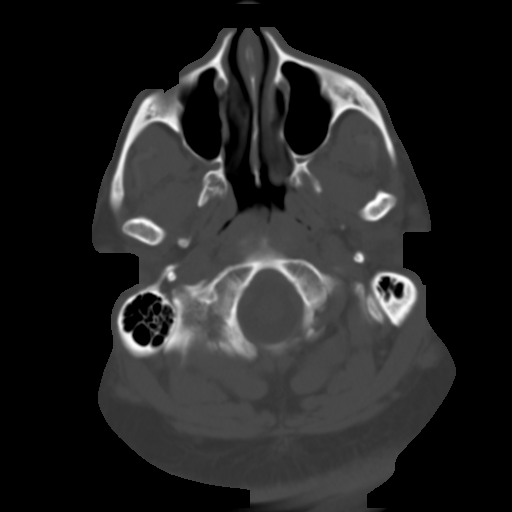
[im 7/33  brain]
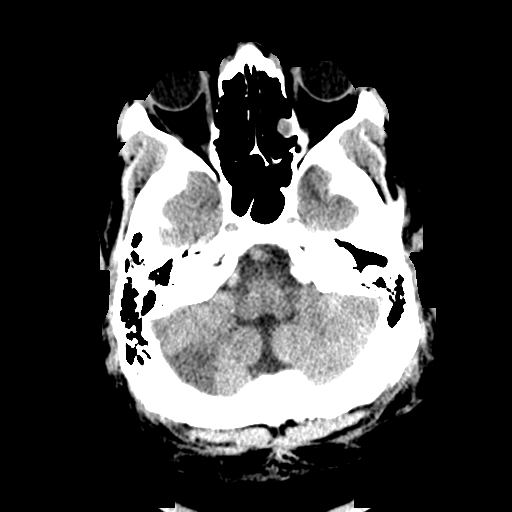
[im 10/33  brain]
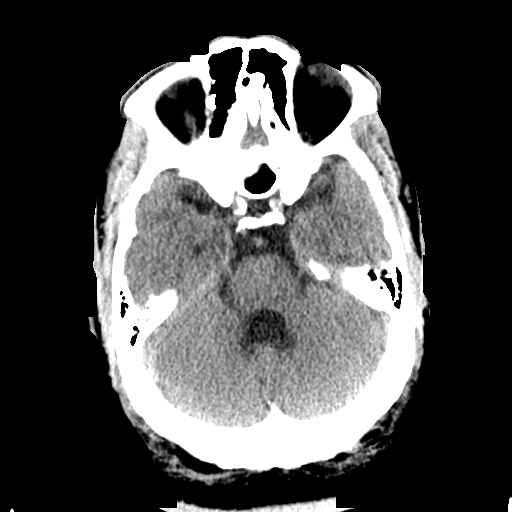
[im 15/33  brain]
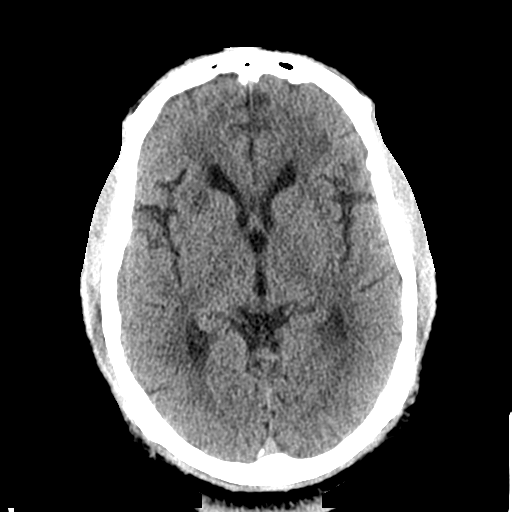
[im 18/33  brain]
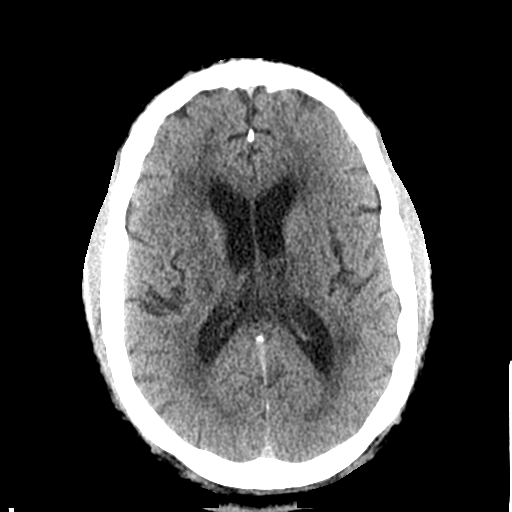
[im 18/33  bone]
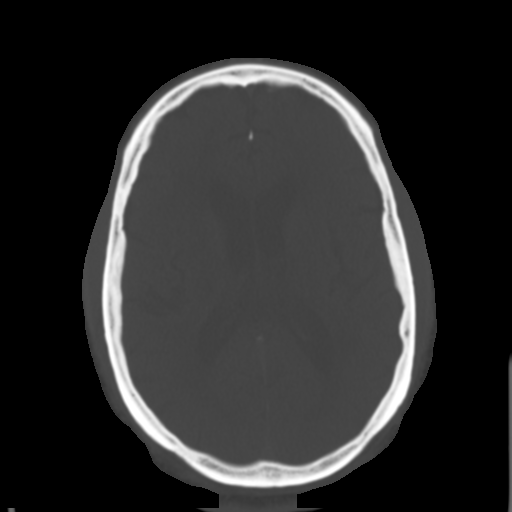
[im 23/33  brain]
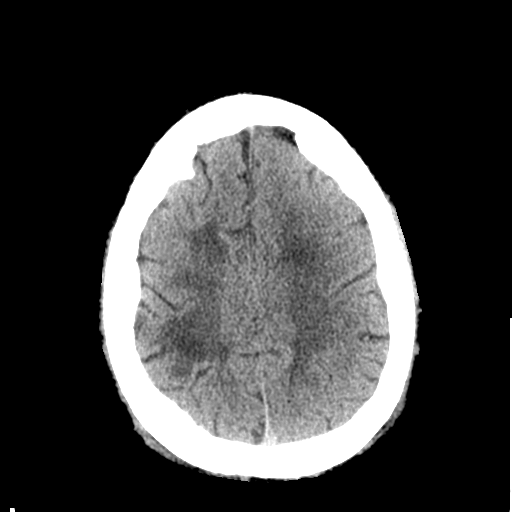
[im 26/33  brain]
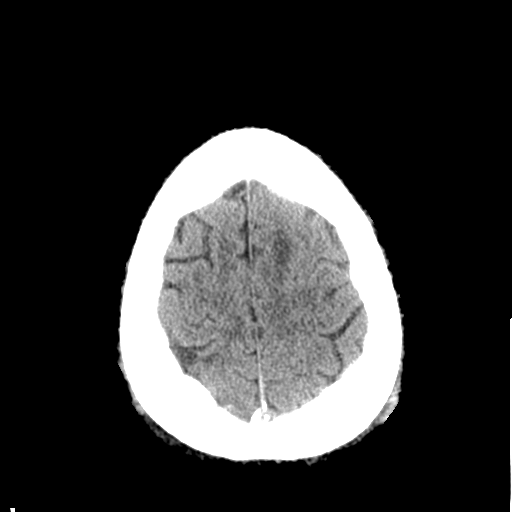
[im 30/33  brain]
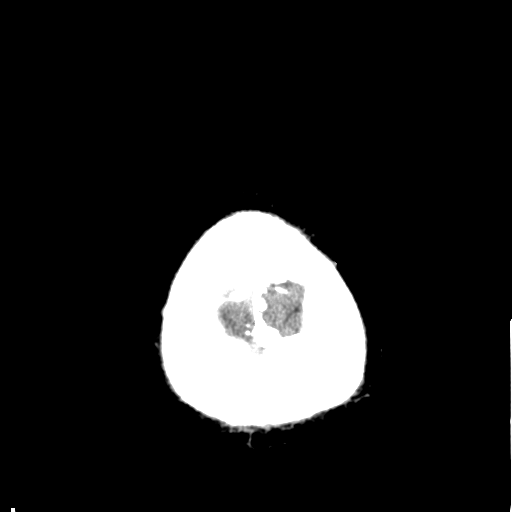

[Series 6: coronal soft tissue · coronal · 0.32mm/px · 3 of 78 slices shown]
[im 26/78  brain]
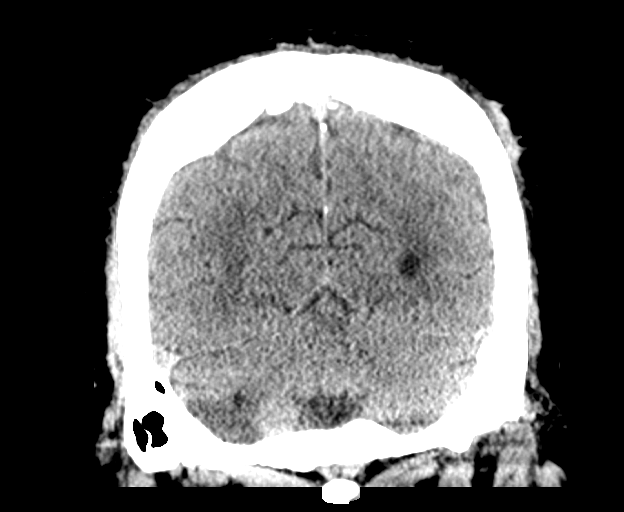
[im 35/78  brain]
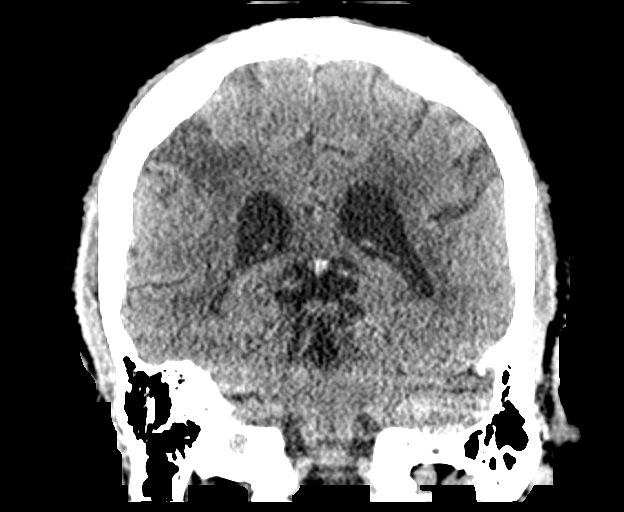
[im 43/78  brain]
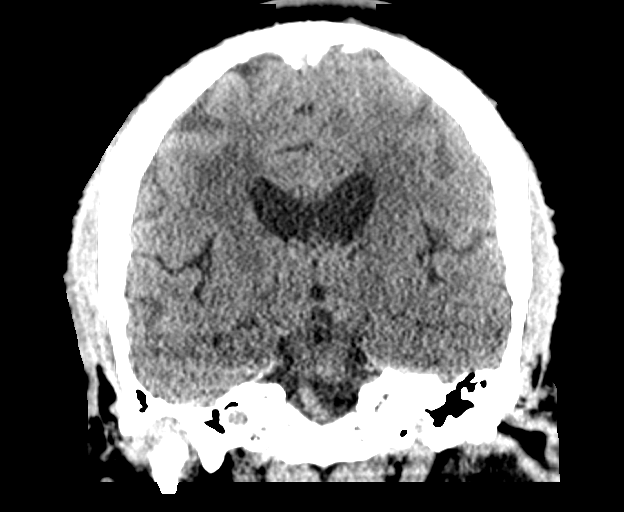

[Series 7: sagittal soft tissue · sagittal · 0.32mm/px · 3 of 65 slices shown]
[im 22/65  brain]
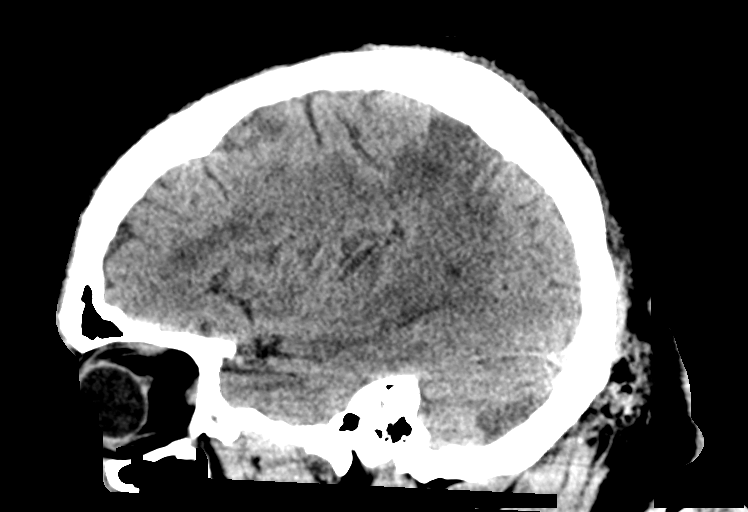
[im 33/65  brain]
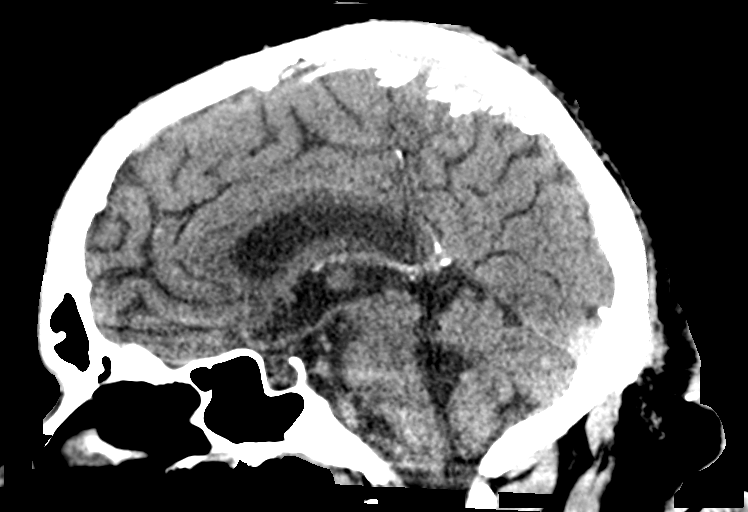
[im 43/65  brain]
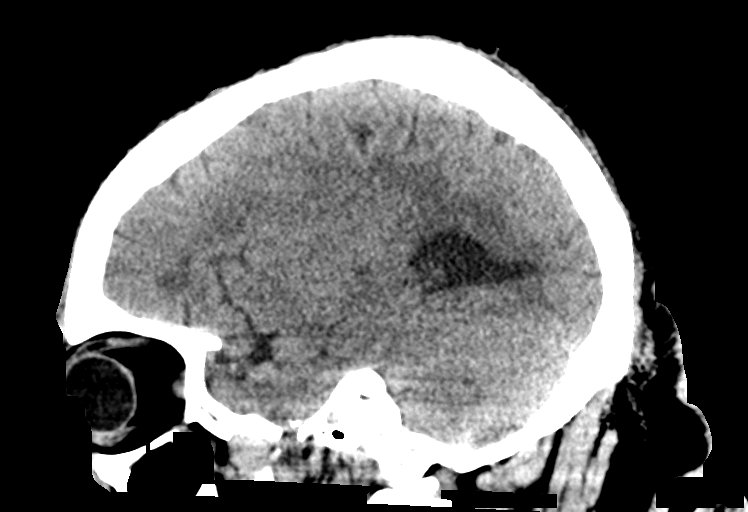

[14 of 47 positions shown; findings below may reference images not displayed]

FINDINGS: Brain: Since the prior head CT, the patient has suffered right
parietal and right cerebellar infarcts. The cerebellar infarct is
remote in appearance. The parietal infarct is likely remote but
cannot be definitively characterized. Extensive chronic
microvascular ischemic change is new since the prior study. No
hemorrhage, mass, midline shift, hydrocephalus or pneumocephalus.

Vascular: No hyperdense vessel or unexpected calcification.

Skull: Intact.  No focal lesion.

Sinuses/Orbits: A small appearing mucous retention cyst or polyp in
the right maxillary sinus is partially imaged. Otherwise negative.

Other: None.
IMPRESSION: A right parietal infarct is new since the prior exam. It is likely
remote but cannot be definitively characterized.

Right cerebellar infarct is new since the prior exam but appears
remote.

Chronic microvascular ischemic change is new since the prior CT.

Small mucous retention cyst or polyp right maxillary sinus is
incompletely imaged.

## 2020-11-28 MED ORDER — SACUBITRIL-VALSARTAN 97-103 MG PO TABS
1.0000 | ORAL_TABLET | Freq: Once | ORAL | Status: AC
  Administered 2020-11-28: 1 via ORAL
  Filled 2020-11-28: qty 1

## 2020-11-28 MED ORDER — CARVEDILOL 12.5 MG PO TABS
37.5000 mg | ORAL_TABLET | Freq: Once | ORAL | Status: AC
  Administered 2020-11-28: 37.5 mg via ORAL
  Filled 2020-11-28: qty 3

## 2020-11-28 MED ORDER — HYDRALAZINE HCL 25 MG PO TABS
100.0000 mg | ORAL_TABLET | Freq: Once | ORAL | Status: AC
  Administered 2020-11-28: 100 mg via ORAL
  Filled 2020-11-28: qty 4

## 2020-11-28 MED ORDER — ASPIRIN 81 MG PO CHEW: 81.0000 mg | CHEWABLE_TABLET | Freq: Every day | ORAL | 0 refills | Status: AC

## 2020-11-28 NOTE — ED Provider Notes (Signed)
[facility]-EMERGENCY DEPT Provider Note   CSN: [id number] Arrival date & time: 11/28/20  1242     History Chief Complaint  Patient presents with   Headache    Ronald Young is a 50 y.o. male.  Presented to ER with concern for headache.  Patient reports headache ongoing for approximately 1 week.  Has been taking Excedrin, Motrin and Tylenol intermittently with minimal relief.  States that he suffers from chronic sinus congestion, ongoing for about a year.  Takes Afrin spray which seems to help.  No change in his congestion.  No fevers or chills, no neck pain or neck stiffness.  No numbness, weakness, speech or vision change.  No difficulty walking.  Patient has a history of nonischemic cardiomyopathy, followed by cardiology.  Has past history of cocaine abuse as well.  HPI     Past Medical History:  Diagnosis Date   Arthritis    Cardiomyopathy (HCC) 09/22/2014   Hypertension     Patient Active Problem List   Diagnosis Date Noted   Acute on chronic combined systolic and diastolic CHF (congestive heart failure) (HCC) 12/20/2019   Essential hypertension 09/26/2014   Bradycardia 09/26/2014   Cocaine use 09/23/2014   Cardiomyopathy (HCC) 09/22/2014   Hypertensive urgency 09/21/2014   Syncope 09/21/2014   Hypertensive emergency 09/21/2014   ARF (acute renal failure) (HCC) 09/21/2014   Elevated troponin     Past Surgical History:  Procedure Laterality Date   RIGHT/LEFT HEART CATH AND CORONARY ANGIOGRAPHY N/A 12/23/2019   Procedure: RIGHT/LEFT HEART CATH AND CORONARY ANGIOGRAPHY;  Surgeon: Runell Gess, MD;  Location: [facility] INVASIVE CV LAB;  Service: Cardiovascular;  Laterality: N/A;       Family History  Problem Relation Age of Onset   Hypertension Mother    Colon cancer Other     Social History   Tobacco Use   Smoking status: Former    Types: Cigarettes   Smokeless tobacco: Never  Vaping Use   Vaping Use: Never used  Substance Use  Topics   Alcohol use: Not Currently   Drug use: Yes    Types: Marijuana    Home Medications Prior to Admission medications   Medication Sig Start Date End Date Taking? Authorizing Provider  aspirin 81 MG chewable tablet Chew 1 tablet (81 mg total) by mouth daily. 11/28/20  Yes Milagros Loll, MD  acetaminophen-codeine (TYLENOL #3) 300-30 MG tablet Take 1 tablet by mouth every 4 (four) hours as needed for moderate pain or severe pain.    [provider]  carvedilol (COREG) 25 MG tablet Take 1.5 tablets (37.5 mg total) by mouth 2 (two) times daily with a meal. 05/11/20   Little Ishikawa, MD  dapagliflozin propanediol (FARXIGA) 10 MG TABS tablet Take 10 mg by mouth daily.    [provider]  diclofenac Sodium (VOLTAREN) 1 % GEL Apply 2 g topically 4 (four) times daily. 10/13/20   Jeannie Fend, PA-C  famotidine (PEPCID) 10 MG tablet Take 1 tablet (10 mg total) by mouth 2 (two) times daily. 05/11/20   Little Ishikawa, MD  hydrALAZINE (APRESOLINE) 50 MG tablet Take 2 tablets (100 mg total) by mouth 3 (three) times daily. 05/12/20 05/07/21  Little Ishikawa, MD  isosorbide mononitrate (IMDUR) 30 MG 24 hr tablet Take 1 tablet (30 mg total) by mouth daily. 05/11/20 05/06/21  Little Ishikawa, MD  lidocaine (LIDODERM) 5 % Place 1 patch onto the skin daily. Remove & Discard patch  within 12 hours or as directed by MD 10/13/20   Jeannie Fend, PA-C  Multiple Vitamins-Minerals (MULTIVITAMIN WITH MINERALS) tablet Take 1 tablet by mouth daily.    [provider]  sacubitril-valsartan (ENTRESTO) 97-103 MG Take 1 tablet by mouth 2 (two) times daily. 07/30/20   Little Ishikawa, MD  spironolactone (ALDACTONE) 25 MG tablet Take 1 tablet (25 mg total) by mouth daily. 09/10/20   Little Ishikawa, MD    Allergies    Strawberry extract  Review of Systems   Review of Systems  Constitutional:  Negative for chills and fever.  HENT:  Negative for  ear pain and sore throat.   Eyes:  Negative for pain and visual disturbance.  Respiratory:  Negative for cough and shortness of breath.   Cardiovascular:  Negative for chest pain and palpitations.  Gastrointestinal:  Negative for abdominal pain and vomiting.  Genitourinary:  Negative for dysuria and hematuria.  Musculoskeletal:  Negative for arthralgias and back pain.  Skin:  Negative for color change and rash.  Neurological:  Positive for headaches. Negative for seizures and syncope.  All other systems reviewed and are negative.  Physical Exam Updated Vital Signs BP (!) 162/115   Pulse 62   Temp 98.3 F (36.8 C) (Oral)   Resp 18   Ht 6' (1.829 m)   Wt 126 kg   SpO2 99%   BMI 37.67 kg/m   Physical Exam Vitals and nursing note reviewed.  Constitutional:      Appearance: He is well-developed.  HENT:     Head: Normocephalic and atraumatic.  Eyes:     Conjunctiva/sclera: Conjunctivae normal.  Cardiovascular:     Rate and Rhythm: Normal rate and regular rhythm.     Heart sounds: No murmur heard. Pulmonary:     Effort: Pulmonary effort is normal. No respiratory distress.     Breath sounds: Normal breath sounds.  Abdominal:     Palpations: Abdomen is soft.     Tenderness: There is no abdominal tenderness.  Musculoskeletal:     Cervical back: Neck supple.  Skin:    General: Skin is warm and dry.  Neurological:     Mental Status: He is alert.     Comments: AAOx3 CN 2-12 intact, speech clear visual fields intact 5/5 strength in b/l UE and LE Sensation to light touch intact in b/l UE and LE Normal FNF Normal gait    ED Results / Procedures / Treatments   Labs (all labs ordered are listed, but only abnormal results are displayed) Labs Reviewed  RESP PANEL BY RT-PCR (FLU A&B, COVID) ARPGX2    EKG None  Radiology CT Head Wo Contrast  Result Date: 11/28/2020 CLINICAL DATA:  Headache and sinus pressure for over 1 week. EXAM: CT HEAD WITHOUT CONTRAST TECHNIQUE:  Contiguous axial images were obtained from the base of the skull through the vertex without intravenous contrast. COMPARISON:  Head CT 09/21/2014. FINDINGS: Brain: Since the prior head CT, the patient has suffered right parietal and right cerebellar infarcts. The cerebellar infarct is remote in appearance. The parietal infarct is likely remote but cannot be definitively characterized. Extensive chronic microvascular ischemic change is new since the prior study. No hemorrhage, mass, midline shift, hydrocephalus or pneumocephalus. Vascular: No hyperdense vessel or unexpected calcification. Skull: Intact.  No focal lesion. Sinuses/Orbits: A small appearing mucous retention cyst or polyp in the right maxillary sinus is partially imaged. Otherwise negative. Other: None. IMPRESSION: A right parietal infarct is new since the  prior exam. It is likely remote but cannot be definitively characterized. Right cerebellar infarct is new since the prior exam but appears remote. Chronic microvascular ischemic change is new since the prior CT. Small mucous retention cyst or polyp right maxillary sinus is incompletely imaged. Electronically Signed   By: Drusilla Kanner M.D.   On: 11/28/2020 17:23   MR BRAIN WO CONTRAST  Result Date: 11/28/2020 CLINICAL DATA:  Neuro deficit, acute, stroke suspected. Additional history provided: Headache for 1 week. EXAM: MRI HEAD WITHOUT CONTRAST TECHNIQUE: Multiplanar, multiecho pulse sequences of the brain and surrounding structures were obtained without intravenous contrast. COMPARISON:  Prior head CT examinations 11/28/2020 and earlier. FINDINGS: Brain: Cerebral volume is normal. Chronic cortical/subcortical infarct within the right parietal lobe. Associated chronic hemosiderin deposition at this site. Small chronic cortical infarcts within the bilateral temporal occipital lobes. Small amount of chronic hemosiderin deposition associated with a small chronic cortical infarct within the left  temporal occipital lobes. Multiple chronic lacunar infarcts within the bilateral cerebral hemispheric Kreeger matter, basal ganglia and thalami. Background moderate/severe chronic small vessel ischemic disease within the cerebral Burks matter, significantly advanced for age. Chronic infarct within the inferior right cerebellar hemisphere. Small chronic infarct also present within the left cerebellar hemisphere. Chronic hemosiderin deposition associated with a chronic infarct within the left thalamus. 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 There is no acute infarct. No evidence of an intracranial mass. No extra-axial fluid collection. No midline shift. Vascular: Expected proximal arterial flow voids. Skull and upper cervical spine: No focal marrow lesion. Sinuses/Orbits: Visualized orbits show no acute finding. Small right maxillary sinus mucous retention cyst. Mild partial T2 hyperintense opacification of the left ethmoid air cells. IMPRESSION: No evidence of acute intracranial abnormality. Extensive and significantly age-advanced chronic ischemic changes with multiple chronic cortically based and lacunar infarcts, as described. Chronic hemorrhage associated with several of these infarcts. Chronic infarcts also present within the bilateral cerebellar hemispheres. 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 Paranasal sinus disease, as described. Electronically Signed   By: Jackey Loge DO   On: 11/28/2020 18:53    Procedures Procedures   Medications Ordered in ED Medications  hydrALAZINE (APRESOLINE) tablet 100 mg (100 mg Oral Given 11/28/20 1850)  sacubitril-valsartan (ENTRESTO) 97-103 mg per tablet (1 tablet Oral Given 11/28/20 1850)  carvedilol (COREG) tablet 37.5 mg (37.5 mg Oral  Given 11/28/20 1849)    ED Course  I have reviewed the triage vital signs and the nursing notes.  Pertinent labs & imaging results that were available during my care of the patient were reviewed by me and considered in my medical decision making (see chart for details).    MDM Rules/Calculators/A&P                           50 year old male presented to ER with chief complaint of headache.  On exam patient appears well in no acute distress.  Initial BP mildly elevated.  Normal neuro exam.  Reports suffering from chronic sinus congestion but headache was new.  Check CT head.  2 areas of possible stroke, 1 definitely remote but the other indeterminate.  Discussed with [facility] on-call for neurology who recommended getting MRI.  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  Discussed  case with Wilford Corner who reviewed MRI findings.  He recommends starting patient on baby aspirin only at present and given the chronic nature of findings okay for discharge and outpatient follow-up.  Recommends patient follow-up with [facility] neurology.  Provided information to patient.  Regarding his chronic sinus congestion, instructed patient to stop using Afrin as this may be affecting his blood pressure.  Recommended he follow-up with ENT for this complaint.  Recommend patient also follow-up with his cardiologist regarding blood pressure management.  Patient was provided his home blood pressure meds and BP improved.  On reassessment he remains well-appearing in no distress.  Given work-up and discussion with neurology, discharged home.  After the discussed management above, the patient was determined to be safe for discharge.  The patient was in agreement with this plan and all questions regarding their care were answered.  ED return precautions were discussed and the patient  will return to the ED with any significant worsening of condition.  Final Clinical Impression(s) / ED Diagnoses Final diagnoses:  Chronic cerebrovascular accident (CVA)  Hypertension, unspecified type  Sinus congestion    Rx / DC Orders ED Discharge Orders          Ordered    aspirin 81 MG chewable tablet  Daily        11/28/20 1946             Milagros Loll, MD 11/29/20 1517

## 2020-11-28 NOTE — ED Provider Notes (Signed)
Emergency Medicine Provider Triage Evaluation Note  Ronald Young , a 50 y.o. male  was evaluated in triage.  Pt complains of headache that started about 1 week ago.  Patient states he has been taking Excedrin as well as ibuprofen and Tylenol with minimal relief.  Reports associated sinus congestion and drainage.  Mild sore throat which has since resolved.  No chest pain, shortness of breath, nausea, vomiting, diarrhea.  Physical Exam  BP (!) 145/106 (BP Location: Right Arm)   Pulse 74   Temp 99.5 F (37.5 C) (Oral)   Resp 16   Ht 6' (1.829 m)   Wt 126 kg   SpO2 93%   BMI 37.67 kg/m  Gen:   Awake, no distress   Resp:  Normal effort  MSK:   Moves extremities without difficulty  Other:    Medical Decision Making  Medically screening exam initiated at 1:22 PM.  Appropriate orders placed.  Ronald Young was informed that the remainder of the evaluation will be completed by another provider, this initial triage assessment does not replace that evaluation, and the importance of remaining in the ED until their evaluation is complete.   Ronald Sou, PA-C 11/28/20 1324    Ronald Baton, MD 11/29/20 [phone]

## 2020-11-28 NOTE — ED Triage Notes (Signed)
States he has had a headache for over 1 week and sinus pressure, used Excedrin and IBU, Tylenol w/o relief. Denies N/V/D. Nasal congestion but no drainage.

## 2020-11-28 NOTE — ED Notes (Signed)
Patient discharged AVS reviewed no concerns at this time.  

## 2020-11-28 NOTE — Discharge Instructions (Addendum)
Please schedule a follow-up appointment both with ENT for your chronic congestion as well as an appointment with neurology.  Our neurologist has recommended taking a baby aspirin daily.  Please continue this until further instructed by your neurologist when you see them in clinic.  If you develop worsening headache, numbness, weakness, speech change, vision change, come back to ER immediately for reassessment.  Recommend stopping Afrin.  Continue your blood pressure medications as prescribed.  Recommend discussing your blood pressure management with primary doctor and cardiology.

## 2020-12-01 ENCOUNTER — Telehealth: Payer: Self-pay | Admitting: Licensed Clinical Social Worker

## 2020-12-01 NOTE — Telephone Encounter (Signed)
LCSW received a call back from pt mother (DPR on file).  She shares that she is at work but pt has a new temporary number. She has assistance applications in front of her and clarifies with this writer the need to gather several documents circled on the documents. LCSW will send letters for notarization along with information about free notary at [facility] and my card. Encouraged pt mother/pt to reach back out to me when documents gathered and we can review together to ensure nothing missing. Pt mother states understanding.   LCSW has placed these in the mail.   [name], MSW, LCSW [facility] Heart/Vascular Care Navigation  [phone]

## 2020-12-01 NOTE — Telephone Encounter (Signed)
LCSW attempted to f/u with pt this morning at [phone]. The phone does not ring and does not have a voicemail, simply disconnects.   I then called alternate number listed as his mothers ([phone]), no answer but was able to leave a HIPAA compliant voicemail requesting a call back.  [name], MSW, LCSW [facility]ascular Care Navigation  [phone]

## 2020-12-09 ENCOUNTER — Telehealth: Payer: Self-pay | Admitting: Licensed Clinical Social Worker

## 2020-12-09 NOTE — Telephone Encounter (Signed)
Pt mother Bettie called this Clinical research associate (DPR on file). She inquired  about letter of nonfiling. I let her know I had faxed in [id number] but usually it can take some time to get that information back from IRS. I let her know about the two alternate to request that form (phone or online system) and received permission to text this to her phone [phone]). Clarified how many notarized letters we need for [employer]. Pt mother mentioned pt need for primary care clinic. Pt had been provided with PCP list, but I offered to make him an appt at a community clinic to be established. Pt mother agreeable, I have reached out to [place], [facility] and [facility] clinics to get soonest appt available. I remain available and encouraged pt mother to call me with any additional questions or concerns.   [name], MSW, LCSW [facility]/Vascular Care Navigation  [phone]

## 2020-12-15 ENCOUNTER — Telehealth: Payer: Self-pay | Admitting: Licensed Clinical Social Worker

## 2020-12-15 NOTE — Telephone Encounter (Signed)
LCSW called pt at new cell number [phone].  I let pt know that I have arranged a PCP appt for him at [facility] for 8/30, he is okay with that date and time. He inquires when appt with Dr. Bjorn Pippin is as well- provided that information. I received permission to text pt these details and mailed him a copy of AVS with upcoming appts on it as well.   Encouraged pt and pt mother to call me if any additional questions/concerns.   [name], MSW, LCSW [facility] Heart/Vascular Care Navigation  [phone]

## 2020-12-29 ENCOUNTER — Encounter (INDEPENDENT_AMBULATORY_CARE_PROVIDER_SITE_OTHER): Payer: Self-pay | Admitting: Primary Care

## 2020-12-29 ENCOUNTER — Other Ambulatory Visit: Payer: Self-pay

## 2020-12-29 ENCOUNTER — Telehealth: Payer: Self-pay

## 2020-12-29 ENCOUNTER — Ambulatory Visit (INDEPENDENT_AMBULATORY_CARE_PROVIDER_SITE_OTHER): Payer: Self-pay | Admitting: Primary Care

## 2020-12-29 ENCOUNTER — Telehealth: Payer: Self-pay | Admitting: Licensed Clinical Social Worker

## 2020-12-29 VITALS — BP 131/85 | HR 62 | Temp 97.3°F | Ht 72.0 in | Wt 280.6 lb

## 2020-12-29 DIAGNOSIS — L818 Other specified disorders of pigmentation: Secondary | ICD-10-CM

## 2020-12-29 DIAGNOSIS — Z7689 Persons encountering health services in other specified circumstances: Secondary | ICD-10-CM

## 2020-12-29 DIAGNOSIS — F32A Depression, unspecified: Secondary | ICD-10-CM

## 2020-12-29 DIAGNOSIS — Z131 Encounter for screening for diabetes mellitus: Secondary | ICD-10-CM

## 2020-12-29 DIAGNOSIS — I10 Essential (primary) hypertension: Secondary | ICD-10-CM

## 2020-12-29 DIAGNOSIS — I429 Cardiomyopathy, unspecified: Secondary | ICD-10-CM

## 2020-12-29 DIAGNOSIS — Z6838 Body mass index (BMI) 38.0-38.9, adult: Secondary | ICD-10-CM

## 2020-12-29 LAB — POCT GLYCOSYLATED HEMOGLOBIN (HGB A1C): Hemoglobin A1C: 5.6 % (ref 4.0–5.6)

## 2020-12-29 NOTE — Telephone Encounter (Deleted)
LCSW received a call from pt mother who shares inquired about SNAP letter needed for CAFA and that she and pt are continuing to wait for the letter of nonfiling from IRS. LCSW shared how pt could go about getting his SNAP letter from DSS, encouraged to go in person to avoid waiting on hold on phone.     Ronald Young, MSW, LCSW [facility] Heart/Vascular Care Navigation  [phone]

## 2020-12-29 NOTE — Telephone Encounter (Signed)
LCSW received a call from pt mother who shares inquired about SNAP letter needed for CAFA and that she and pt are continuing to wait for the letter of nonfiling from IRS. LCSW shared how pt could go about getting his SNAP letter from DSS- encouraged pt, if able, to go in person to avoid waiting on hold on phone. Pt mother also provided some clarity that this is not a Medicaid appeal or any additional Medicaid application, rather if pt is eligible, it is financial assistance through [facility] which can assist with previous outstanding medical bills as well as any ongoing bills. Pt mother states understanding.    She also inquires if pt can have an upcoming appointment at [facility] instead of returning to [facility], not due to provider but rather for convenience since he has been seen there in the past. I have passed this request on to [place], [facility] w/ [facility] to see if possible. When I have update I will give pt a call and let him know.      Ronald Young, MSW, LCSW [facility] Health Heart/Vascular Care Navigation  [phone]

## 2020-12-29 NOTE — Patient Instructions (Signed)
https://www.nimh.nih.gov/health/publications/depression-what-you-need-to-know/index.shtml">  Depression Screening Depression screening is a tool that your health care provider can use to learn if you have symptoms of depression. Depression is a common condition with many symptoms that are also often found in other conditions. Depression is treatable, but it must first be diagnosed. You may not know that certain feelings, thoughts, and behaviors that you are having can be symptoms of depression. Taking a depression screening test can help you and your health care provider decide if you need more assessment, or if you should be referredto a mental health care provider. What are the screening tests? You may have a physical exam to see if another condition is affecting your mental health. You may have a blood or urine sample taken during the physical exam. You may be interviewed using a screening tool that was developed from research, such as one of these: Patient Health Questionnaire (PHQ). This is a set of either 2 or 9 questions. A health care provider who has been trained to score this screening test uses a guide to assess if your symptoms suggest that you may have depression. Hamilton Depression Rating Scale (HAM-D). This is a set of either 17 or 24 questions. You may be asked to take it again during or after your treatment, to see if your depression has gotten better. Beck Depression Inventory (BDI). This is a set of 21 multiple choice questions. Your health care provider scores your answers to assess: Your level of depression, ranging from mild to severe. Your response to treatment. Your health care provider may talk with you about your daily activities, such as eating, sleeping, work, and recreation, and ask if you have had any changes in activity. Your health care provider may ask you to see a mental health specialist, such as a psychiatrist or psychologist, for more evaluation. Who should be  screened for depression?  All adults, including adults with a family history of a mental health disorder. Adolescents who are 12-18 years old. People who are recovering from a myocardial infarction (MI). Pregnant women, or women who have given birth. People who have a long-term (chronic) illness. Anyone who has been diagnosed with another type of a mental health disorder. Anyone who has symptoms that could show depression. What do my results mean? Your health care provider will review the results of your depression screening, physical exam, and lab tests. Positive screens suggest that you may have depression. Screening is the first step in getting the care that you may need. It is up to you to get your screening results. Ask your health care provider, or the department that is doing your screening tests, when your results will beready. Talk with your health care provider about your results and diagnosis. A diagnosis of depression is made using the Diagnostic and Statistical Manual of Mental Disorders (DSM-V). This is a book that lists the number and type of symptoms that mustbe present for a health care provider to give a specific diagnosis.  Your health care provider may work with you to treat your symptoms of depression, or your health care provider may help you find a mental healthprovider who can assess, diagnose, and treat your depression. Get help right away if: You have thoughts about hurting yourself or others. If you ever feel like you may hurt yourself or others, or have thoughts about taking your own life, get help right away. You can go to your nearest emergency department or call: Your local emergency services (911 in the [place] A suicide   crisis helpline, such as the National Suicide Prevention Lifeline at 1-800-273-8255. This is open 24 hours a day. Summary Depression screening is the first step in getting the help that you may need. If your screening test shows symptoms of  depression (is positive), your health care provider may ask you to see a mental health provider. Anyone who is age 12 or older should be screened for depression. This information is not intended to replace advice given to you by your health care provider. Make sure you discuss any questions you have with your healthcare provider. Document Revised: 04/01/2020 Document Reviewed: 10/10/2019 Elsevier Patient Education  2022 Elsevier Inc.  

## 2020-12-29 NOTE — Telephone Encounter (Signed)
Warm Handoff during today's office visit with PCP.Patient shared his recent struggles with mental health concerns due to situational life changes. PCP engaged Case Manager to provide insight on commnuity resources. Case Manager shared resources with patient for to appeal his medicaid.   CM discovered through notes from LCSW within the [facility] system, the patient is already receiving assistance with applying for CAFA. Patients mother is assisting with taking the lead on getting paperwork in for the patient.   CM will connect the patient to [facility] LCSW to assess the patients need to for Integrated Behavioral Health.

## 2020-12-29 NOTE — Progress Notes (Signed)
[facility]   Subjective:   [name] is a 50 y.o. male presents for emergency room follow up and establish care.  11/28/20  patient presented to  emergency room with concern for headache. This had been present for 1 week. ( Pain  9/10)  right temporal area.Taking Excedrin( acetaminophen/aspirin) Ibuprofen 200mg  (2) and Tylenol 500mg  (2)  alternating medication twice daily with minimal relief.  No fevers or chills, no neck pain or neck stiffness.  No  chest pain ,numbness, weakness, speech or vision change.  No difficulty walking. Patient was discharged with dx Chronic cerebrovascular accident (CVA), Hypertension, unspecified type and Sinus congestion. Past Medical History:  Diagnosis Date   Arthritis    Cardiomyopathy (HCC) 09/22/2014   Hypertension      Allergies  Allergen Reactions   Strawberry Extract Hives      Current Outpatient Medications on File Prior to Visit  Medication Sig Dispense Refill   aspirin 81 MG chewable tablet Chew 1 tablet (81 mg total) by mouth daily. 30 tablet 0   carvedilol (COREG) 25 MG tablet Take 1.5 tablets (37.5 mg total) by mouth 2 (two) times daily with a meal. 270 tablet 3   dapagliflozin propanediol (FARXIGA) 10 MG TABS tablet Take 10 mg by mouth daily.     hydrALAZINE (APRESOLINE) 50 MG tablet Take 2 tablets (100 mg total) by mouth 3 (three) times daily. 540 tablet 3   isosorbide mononitrate (IMDUR) 30 MG 24 hr tablet Take 1 tablet (30 mg total) by mouth daily. 90 tablet 3   Multiple Vitamins-Minerals (MULTIVITAMIN WITH MINERALS) tablet Take 1 tablet by mouth daily.     sacubitril-valsartan (ENTRESTO) 97-103 MG Take 1 tablet by mouth 2 (two) times daily. 180 tablet 3   spironolactone (ALDACTONE) 25 MG tablet Take 1 tablet (25 mg total) by mouth daily. 90 tablet 3   diclofenac Sodium (VOLTAREN) 1 % GEL Apply 2 g topically 4 (four) times daily. 100 g O   No current facility-administered medications on file prior to visit.      Review of System: Review of Systems  HENT:  Positive for congestion.   Psychiatric/Behavioral:  The patient has insomnia.   All other systems reviewed and are negative.  Objective:  BP 131/85 (BP Location: Right Arm, Patient Position: Sitting, Cuff Size: Large)   Pulse 62   Temp (!) 97.3 F (36.3 C) (Temporal)   Ht 6' (1.829 m)   Wt 280 lb 9.6 oz (127.3 kg)   SpO2 96%   BMI 38.06 kg/m   Filed Weights   12/29/20 1046  Weight: 280 lb 9.6 oz (127.3 kg)    Physical Exam: General Appearance: Well nourished, obese male in no apparent distress. Eyes: PERRLA, EOMs, conjunctiva no swelling or erythema Sinuses: No Frontal/maxillary tenderness ENT/Mouth: Ext aud canals clear, TMs without erythema, bulging.  Hearing normal.  Neck: Supple, thyroid normal.  Respiratory: Respiratory effort normal, BS equal bilaterally without rales, rhonchi, wheezing or stridor.  Cardio: RRR with no MRGs. Brisk peripheral pulses without edema.  Abdomen: Soft, + BS.  Non tender, no guarding, rebound, hernias, masses. Lymphatics: Non tender without lymphadenopathy.  Musculoskeletal: Full ROM, 5/5 strength, normal gait.  Skin: Warm, dry without rashes, lesions, ecchymosis.  Neuro: Normal muscle tone, no cerebellar symptoms. Sensation intact.  Psych: Awake and oriented X 3, normal affect, Insight and Judgment appropriate.    Assessment:  Ronald Young was seen today for hospitalization follow-up.  Diagnoses and all orders for this visit:  Screening  for diabetes mellitus -     HgB A1c 5.6  Per ADA guideline not a diabetic but discussed risk factors family hx, and obese. Monitor foods that are high in carbohydrates are the following rice, grits,potatoes, breads, sugars, and pastas.  Reduction in the intake (eating) will assist in lowering your blood sugars. Exercise 30 mins daily or 150 mins weekly  Encounter to establish care Establish care  Cardiomyopathy, unspecified type (HCC) Followed by  cardiology   Essential hypertension Counseled on blood pressure goal of less than 130/80, low-sodium, DASH diet, medication compliance, 150 minutes of moderate intensity exercise per week. Followed by cardiology   Class 2 severe obesity due to excess calories with serious comorbidity and body mass index (BMI) of 38.0 to 38.9 in adult [facility]) Obesity is 30-39 indicating an excess in caloric intake or underlining conditions. This may lead to other co-morbidities. Lifestyle modifications of diet and exercise may reduce obesity.    Depression, unspecified depression type Flowsheet Row Office Visit from 12/29/2020 in [facility]  PHQ-9 Total Score 19      Situational denied disability and Medicaid . Stressor how to pay for bills- advised call Legal aid and spoke with [facility] - she will refer to CSW  This note has been created with Education officer, environmental. Any transcriptional errors are unintentional.   Grayce Sessions, NP 12/29/2020, 10:54 AM

## 2020-12-31 ENCOUNTER — Telehealth: Payer: Self-pay | Admitting: Licensed Clinical Social Worker

## 2020-12-31 NOTE — Telephone Encounter (Signed)
Spoke with pt via telephone, made him aware of pt mother's request for provider at [facility] for next appointment due to familiarity. Pt in agreement, aware of where that clinic is; he is aware appointment in 3 months made w/ Dr. Alvis Lemmings who he has seen before. I will mail pt reminders for upcoming appointments.  [name], MSW, LCSW [facility] Health Heart/Vascular Care Navigation  [phone]

## 2021-01-01 NOTE — Telephone Encounter (Signed)
Spoke with pt and scheduled appt for 9/28 at 3:30

## 2021-01-05 ENCOUNTER — Other Ambulatory Visit: Payer: Self-pay

## 2021-01-05 ENCOUNTER — Other Ambulatory Visit (INDEPENDENT_AMBULATORY_CARE_PROVIDER_SITE_OTHER): Payer: Self-pay

## 2021-01-05 DIAGNOSIS — L818 Other specified disorders of pigmentation: Secondary | ICD-10-CM

## 2021-01-05 DIAGNOSIS — Z6838 Body mass index (BMI) 38.0-38.9, adult: Secondary | ICD-10-CM

## 2021-01-06 ENCOUNTER — Other Ambulatory Visit (INDEPENDENT_AMBULATORY_CARE_PROVIDER_SITE_OTHER): Payer: Self-pay | Admitting: Primary Care

## 2021-01-06 DIAGNOSIS — E782 Mixed hyperlipidemia: Secondary | ICD-10-CM

## 2021-01-06 LAB — LIPID PANEL
Chol/HDL Ratio: 5.4 ratio — ABNORMAL HIGH (ref 0.0–5.0)
Cholesterol, Total: 212 mg/dL — ABNORMAL HIGH (ref 100–199)
HDL: 39 mg/dL — ABNORMAL LOW (ref >39)
LDL Chol Calc (NIH): 150 mg/dL — ABNORMAL HIGH (ref 0–99)
Triglycerides: 124 mg/dL (ref 0–149)
VLDL Cholesterol Cal: 23 mg/dL (ref 5–40)

## 2021-01-06 LAB — HEPATITIS C ANTIBODY: Hep C Virus Ab: <0.1 s/co ratio (ref 0.0–0.9)

## 2021-01-06 MED ORDER — ATORVASTATIN CALCIUM 40 MG PO TABS
40.0000 mg | ORAL_TABLET | Freq: Every day | ORAL | 3 refills | Status: DC
  Filled 2021-02-17: qty 30, 30d supply, fill #0

## 2021-01-07 ENCOUNTER — Telehealth: Payer: Self-pay | Admitting: Licensed Clinical Social Worker

## 2021-01-07 ENCOUNTER — Telehealth (INDEPENDENT_AMBULATORY_CARE_PROVIDER_SITE_OTHER): Payer: Self-pay

## 2021-01-07 NOTE — Telephone Encounter (Signed)
Unable to reach patient on his phone. Per dpr contacted his mother bettie and gave results per PCP. She will inform patient. Maryjean Morn, CMA

## 2021-01-07 NOTE — Telephone Encounter (Signed)
LCSW received a call from pt mother, they have gathered all needed documents. We went over the [facility] Card application to ensure questions answered, she is having a hard time locating the [facility] application for pt so I will send them a new one. Made them an appointment to meet with Mikle Bosworth, financial counselor at [facility] for 9/14 at 1:30pm. This was texted to pt mother phone.   [name], MSW, LCSW [facility] Health Heart/Vascular Care Navigation  [phone]

## 2021-01-07 NOTE — Telephone Encounter (Signed)
-----   Message from Grayce Sessions, NP sent at 01/06/2021  2:23 PM EDT ----- Hepatitis negative.   Healthy lifestyle diet of fruits vegetables fish nuts whole grains and low saturated fat . Foods high in cholesterol or liver, fatty meats,cheese, butter avocados, nuts and seeds, chocolate and fried foods. Sent in atorvastin 40mg  take at bedtime

## 2021-01-12 ENCOUNTER — Telehealth: Payer: Self-pay | Admitting: Licensed Clinical Social Worker

## 2021-01-12 NOTE — Telephone Encounter (Signed)
LCSW called pt phone [phone], left reminder about appt at [facility] w/ financial counselor tomorrow. No answer, voicemail also included my contact information for any additional questions/concerns.   [name], MSW, LCSW [facility] Health Heart/Vascular Care Navigation  [phone]

## 2021-01-13 ENCOUNTER — Ambulatory Visit: Payer: Self-pay | Attending: Primary Care

## 2021-01-13 ENCOUNTER — Other Ambulatory Visit: Payer: Self-pay

## 2021-01-13 NOTE — Telephone Encounter (Signed)
LCSW received a call from pt mother- she shared that pt has not received the CAFA application. I was able to scan it to her and she was able to print them off at home to bring with her and pt to pt's appt w/ Mikle Bosworth, financial counselor on 9/14. Pt mother understands that all the supporting documents along with [facility] and CAFA should be taken w/ them.   [name], MSW, LCSW [facility] Health Heart/Vascular Care Navigation  [phone]

## 2021-01-27 ENCOUNTER — Ambulatory Visit: Payer: MEDICAID | Attending: Family Medicine | Admitting: Clinical

## 2021-01-27 ENCOUNTER — Other Ambulatory Visit: Payer: Self-pay

## 2021-01-27 DIAGNOSIS — F411 Generalized anxiety disorder: Secondary | ICD-10-CM

## 2021-01-27 DIAGNOSIS — F331 Major depressive disorder, recurrent, moderate: Secondary | ICD-10-CM

## 2021-01-27 DIAGNOSIS — R45851 Suicidal ideations: Secondary | ICD-10-CM
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
## 2021-02-09 ENCOUNTER — Other Ambulatory Visit: Payer: Self-pay

## 2021-02-09 ENCOUNTER — Ambulatory Visit (HOSPITAL_COMMUNITY): Payer: No Payment, Other | Admitting: Licensed Clinical Social Worker

## 2021-02-09 DIAGNOSIS — F331 Major depressive disorder, recurrent, moderate: Secondary | ICD-10-CM

## 2021-02-11 ENCOUNTER — Telehealth: Payer: Self-pay | Admitting: Licensed Clinical Social Worker

## 2021-02-11 NOTE — Telephone Encounter (Signed)
LCSW called pt to let him know that he has been approved for [employer] through March 2023, he also has been approved for [employer]. No answer on his cell phone [phone]. Was mailed a letter indicating such.   [name], MSW, LCSW [facility] Health Heart/Vascular Care Navigation  [phone]
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
## 2021-02-15 ENCOUNTER — Ambulatory Visit: Payer: Self-pay | Attending: Family Medicine | Admitting: Clinical

## 2021-02-15 ENCOUNTER — Other Ambulatory Visit: Payer: Self-pay

## 2021-02-15 ENCOUNTER — Telehealth: Payer: Self-pay | Admitting: Licensed Clinical Social Worker

## 2021-02-15 DIAGNOSIS — F331 Major depressive disorder, recurrent, moderate: Secondary | ICD-10-CM

## 2021-02-15 NOTE — Telephone Encounter (Signed)
LCSW received call back from pt, confirmed he had gotten my voicemail. Clarified questions about his approval for CAFA and encouraged him to bring it along with his Orange Card to all appointments. If he should get any bills from the system he should call [phone] and clarify whether or not they should be refiled under CAFA. Pt states understanding and was encouraged to call w/ any additional questions or concerns.   [name], MSW, LCSW [facility] Heart/Vascular Care Navigation  [phone]

## 2021-02-17 ENCOUNTER — Other Ambulatory Visit: Payer: Self-pay

## 2021-02-17 MED ORDER — LOSARTAN POTASSIUM 50 MG PO TABS
50.0000 mg | ORAL_TABLET | Freq: Every day | ORAL | 1 refills | Status: DC
  Filled 2021-02-17: qty 30, 30d supply, fill #0

## 2021-02-18 ENCOUNTER — Other Ambulatory Visit: Payer: Self-pay
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
## 2021-03-01 ENCOUNTER — Ambulatory Visit: Payer: Self-pay | Attending: Primary Care | Admitting: Clinical

## 2021-03-01 ENCOUNTER — Other Ambulatory Visit: Payer: Self-pay

## 2021-03-01 DIAGNOSIS — F331 Major depressive disorder, recurrent, moderate: Secondary | ICD-10-CM
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
## 2021-03-17 ENCOUNTER — Ambulatory Visit: Payer: Self-pay | Attending: Primary Care | Admitting: Clinical

## 2021-03-17 ENCOUNTER — Other Ambulatory Visit: Payer: Self-pay

## 2021-03-17 DIAGNOSIS — F331 Major depressive disorder, recurrent, moderate: Secondary | ICD-10-CM
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
## 2021-03-24 ENCOUNTER — Ambulatory Visit: Payer: Self-pay | Admitting: Family Medicine

## 2021-03-29 NOTE — Progress Notes (Signed)
Cardiology Office Note:    Date:  03/30/2021   ID:  Ronald Young, DOB Sep 28, 1970, MRN [id number]  PCP:  Grayce Sessions, NP  Cardiologist:  Little Ishikawa, MD  Electrophysiologist:  None   Referring MD: No ref. provider found   No chief complaint on file.    History of Present Illness:    Ronald Young is a 50 y.o. male with a hx of nonischemic cardiomyopathy, mild bilateral carotid artery disease, hypertension, cocaine use who presents for follow-up.  He was admitted with decompensated heart failure and hypertensive urgency in August 2021, had been off his medications for years.  Echocardiogram 12/21/2019 showed LVEF less than 20% with global hypokinesis, moderate LVH, grade 3 diastolic dysfunction, severe RV dysfunction, mild MR, mild dilatation of the ascending aorta measuring 40 mm.  RHC/LHC on 12/23/2019 showed normal coronary arteries and filling pressures.  He was discharged on losartan 50 mg daily, carvedilol 25 mg twice daily, spironolactone 25 mg daily.  He was discharged on as needed Lasix, advised to monitor daily weights.     Repeat echocardiogram on 06/08/2020 showed significant improvement in LV systolic function (EF 45 to 50%), moderate LVH, grade 2 diastolic dysfunction, normal RV function, no significant valvular disease.  Since last clinic visit, he reports that he is doing well.  Does report some shortness of breath when walking up stairs.  Denies any chest pain, lightheadedness, syncope, or lower extremity edema.  Reports he was started on atorvastatin by his PCP but developed palpitations with this and stopped taking.   Wt Readings from Last 3 Encounters:  03/30/21 281 lb (127.5 kg)  12/29/20 280 lb 9.6 oz (127.3 kg)  11/28/20 277 lb 12.5 oz (126 kg)   BP Readings from Last 3 Encounters:  03/30/21 138/82  12/29/20 131/85  11/28/20 (!) 162/115   Past Medical History:  Diagnosis Date   Arthritis    Cardiomyopathy (HCC) 09/22/2014   Hypertension      Past Surgical History:  Procedure Laterality Date   RIGHT/LEFT HEART CATH AND CORONARY ANGIOGRAPHY N/A 12/23/2019   Procedure: RIGHT/LEFT HEART CATH AND CORONARY ANGIOGRAPHY;  Surgeon: Runell Gess, MD;  Location: [facility];  Service: Cardiovascular;  Laterality: N/A;   Current Medications: Current Meds  Medication Sig   aspirin 81 MG chewable tablet Chew 1 tablet (81 mg total) by mouth daily.   carvedilol (COREG) 25 MG tablet Take 1.5 tablets (37.5 mg total) by mouth 2 (two) times daily with a meal.   dapagliflozin propanediol (FARXIGA) 10 MG TABS tablet Take 10 mg by mouth daily.   diclofenac Sodium (VOLTAREN) 1 % GEL Apply 2 g topically 4 (four) times daily.   hydrALAZINE (APRESOLINE) 50 MG tablet Take 2 tablets (100 mg total) by mouth 3 (three) times daily.   isosorbide mononitrate (IMDUR) 30 MG 24 hr tablet Take 1 tablet (30 mg total) by mouth daily.   losartan (COZAAR) 50 MG tablet Take 1 tablet (50 mg total) by mouth daily.   Multiple Vitamins-Minerals (MULTIVITAMIN WITH MINERALS) tablet Take 1 tablet by mouth daily.   rosuvastatin (CRESTOR) 10 MG tablet Take 1 tablet (10 mg total) by mouth daily.   sacubitril-valsartan (ENTRESTO) 97-103 MG Take 1 tablet by mouth 2 (two) times daily.   spironolactone (ALDACTONE) 25 MG tablet Take 1 tablet (25 mg total) by mouth daily.   [DISCONTINUED] atorvastatin (LIPITOR) 40 MG tablet Take 1 tablet (40 mg total) by mouth daily.     Allergies:  Strawberry extract   Social History   Socioeconomic History   Marital status: Single    Spouse name: Not on file   Number of children: Not on file   Years of education: Not on file   Highest education level: Not on file  Occupational History   Not on file  Tobacco Use   Smoking status: Former    Types: Cigarettes   Smokeless tobacco: Never  Vaping Use   Vaping Use: Never used  Substance and Sexual Activity   Alcohol use: Not Currently   Drug use: Yes    Types: Marijuana    Sexual activity: Not on file  Other Topics Concern   Not on file  Social History Narrative   Not on file   Social Determinants of Health   Financial Resource Strain: Medium Risk   Difficulty of Paying Living Expenses: Somewhat hard  Food Insecurity: No Food Insecurity   Worried About Running Out of Food in the Last Year: Never true   Ran Out of Food in the Last Year: Never true  Transportation Needs: No Transportation Needs   Lack of Transportation (Medical): No   Lack of Transportation (Non-Medical): No  Physical Activity: Not on file  Stress: Not on file  Social Connections: Not on file     Family History: [name] family history includes Colon cancer in an other family member; Hypertension in his mother.  ROS:   Please see the history of present illness.     (+) palpitations  (+)Shortness of breath  (+) headaches  (+) Sinus problems All other systems reviewed and are negative.  EKGs/Labs/Other Studies Reviewed:    The following studies were reviewed today: ECHO 06/08/2020:   IMPRESSIONS    1. Left ventricular ejection fraction, by estimation, is 45 to 50%. The  left ventricle has mildly decreased function. The left ventricle  demonstrates global hypokinesis. There is moderate concentric left  ventricular hypertrophy. Left ventricular  diastolic parameters are consistent with Grade II diastolic dysfunction  (pseudonormalization). Elevated left atrial pressure.   2. Right ventricular systolic function is normal. The right ventricular  size is normal. Tricuspid regurgitation signal is inadequate for assessing  PA pressure.   3. Left atrial size was mild to moderately dilated.   4. The mitral valve is grossly normal. Trivial mitral valve  regurgitation. No evidence of mitral stenosis.   5. The aortic valve is tricuspid. Aortic valve regurgitation is not  visualized. No aortic stenosis is present.   6. The inferior vena cava is normal in size with greater than  50%  respiratory variability, suggesting right atrial pressure of 3 mmHg.   R/L HEART CATH 12/23/2019 IMPRESSION: [name] has normal coronary arteries, elevated LVEDP and fairly normal filling pressures. He has a nonischemic cardiomyopathy probably hypertensive in nature from failure to take his prescribed medications. The antecubital venous sheath was removed and pressure held. The radial sheath was removed and a TR band was placed on the right wrist to achieve patent hemostasis. The patient left lab in stable condition. He'll be transferred back to [facility] long hospital once the TR band is off and resume guideline directed optimal medical treatment. He left the lab in stable condition.  ECHO 12/21/19  IMPRESSIONS    1. Left ventricular ejection fraction, by estimation, is <20%. The left  ventricle has severely decreased function. The left ventricle demonstrates  global hypokinesis. The left ventricular internal cavity size was  moderately dilated. There is moderate  concentric left ventricular  hypertrophy. Left ventricular diastolic  parameters are consistent with Grade III diastolic dysfunction  (restrictive).   2. Right ventricular systolic function is severely reduced. The right  ventricular size is moderately enlarged.   3. Left atrial size was severely dilated.   4. Right atrial size was mildly dilated.   5. The mitral valve is normal in structure. Mild mitral valve  regurgitation. No evidence of mitral stenosis.   6. The aortic valve is normal in structure. Aortic valve regurgitation is  not visualized. No aortic stenosis is present.   7. There is mild dilatation of the ascending aorta measuring 40 mm.   8. The inferior vena cava is normal in size with greater than 50%  respiratory variability, suggesting right atrial pressure of 3 mmHg.   EKG:   03/30/21: Normal sinus rhythm, rate 67, Q-wave in V1-4 07/22: EKG was not ordered today.  4/22- EKG was not ordered today.  2/22-  NSR with first degree AV block, rate 74, TWI in I, II, aVL, V4-6  Recent Labs: 04/06/2020: BNP 122.5 11/18/2020: BUN 16; Creatinine, Ser 1.14; Potassium 4.2; Sodium 143  Recent Lipid Panel    Component Value Date/Time   CHOL 212 (H) 01/05/2021 0949   TRIG 124 01/05/2021 0949   HDL 39 (L) 01/05/2021 0949   CHOLHDL 5.4 (H) 01/05/2021 0949   CHOLHDL 6.5 12/21/2019 0443   VLDL 13 12/21/2019 0443   LDLCALC 150 (H) 01/05/2021 0949    Physical Exam:    VS:  BP 138/82   Pulse 67   Ht 6' (1.829 m)   Wt 281 lb (127.5 kg)   SpO2 96%   BMI 38.11 kg/m     Wt Readings from Last 3 Encounters:  03/30/21 281 lb (127.5 kg)  12/29/20 280 lb 9.6 oz (127.3 kg)  11/28/20 277 lb 12.5 oz (126 kg)     GEN: Well nourished, well developed in no acute distress HEENT: Normal NECK: No JVD; No carotid bruits CARDIAC: RRR, no murmurs, rubs, gallops RESPIRATORY:  Clear to auscultation without rales, wheezing or rhonchi  ABDOMEN: Soft, non-tender, non-distended MUSCULOSKELETAL:  No edema; No deformity  SKIN: Warm and dry NEUROLOGIC:  Alert and oriented x 3 PSYCHIATRIC:  Normal affect   ASSESSMENT:    1. Chronic combined systolic and diastolic heart failure (HCC)   2. Nonischemic cardiomyopathy (HCC)   3. Essential hypertension   4. Snoring   5. Hyperlipidemia, unspecified hyperlipidemia type      PLAN:    Chronic combined systolic and diastolic heart failure: Echocardiogram 12/21/2019 showed LVEF less than 20% with global hypokinesis, moderate LVH, grade 3 diastolic dysfunction, severe RV dysfunction, mild MR, mild dilatation of the ascending aorta measuring 40 mm.  RHC/LHC on 12/23/2019 showed normal coronary arteries and filling pressures.  Nonischemic cardiomyopathy, suspect due to uncontrolled hypertension.  Repeat echocardiogram on 06/08/2020 showed significant improvement in LV systolic function (EF 45 to 50%), moderate LVH, grade 2 diastolic dysfunction, normal RV function, no significant  valvular disease. -Continue Entresto 97-103 mg twice daily -Continue carvedilol 37.5 mg twice daily -Continue spironolactone 25 mg daily -Continue hydralazine 100 mg 3 times daily and Imdur 30 mg daily -Continue Farxiga 10 mg daily -Recommend checking BMP  -As needed Lasix, currently appears euvolemic. -Cardiac MRI to work-up etiology of cardiomyopathy  Hypertension: Continue entresto, carvedilol, spironolactone, hydralazine/Imdur as above.   Cocaine use: Denies recent use.  Encouraged continued cessation  Marijuana use: encourage cessation  GERD: Continue famotidine  Snoring: Suspect undiagnosed OSA contributing  to difficult to control hypertension.  Will check sleep study  Hyperlipidemia: LDL 150 12/2020.  10-year ASCVD risk score 10.5%.  Started on atorvastatin but reports did not tolerate due to palpitations when taking.  Will trial rosuvastatin 10 mg daily  RTC in 3 months   Medication Adjustments/Labs and Tests Ordered: Current medicines are reviewed at length with the patient today.  Concerns regarding medicines are outlined above.  Orders Placed This Encounter  Procedures   MR CARDIAC MORPHOLOGY W WO CONTRAST   Basic metabolic panel   CBC   EKG 12-Lead   Split night study     Meds ordered this encounter  Medications   rosuvastatin (CRESTOR) 10 MG tablet    Sig: Take 1 tablet (10 mg total) by mouth daily.    Dispense:  90 tablet    Refill:  3    STOP atorvastatin     Patient Instructions  Medication Instructions:  STOP atorvastatin (Lipitor) START rosuvastatin (Crestor) 10 mg daily  *If you need a refill on your cardiac medications before your next appointment, please call your pharmacy*   Lab Work: Please return for labs 1-2 weeks prior to MRI (BMET, CBC)  Our in office lab hours are Monday-Friday 8:00-4:00, closed for lunch 12:45-1:45 pm.  No appointment needed.  Testing/Procedures: Your physician has requested that you have a cardiac MRI. Cardiac  MRI uses a computer to create images of your heart as its beating, producing both still and moving pictures of your heart and major blood vessels. For further information please visit [email or web address]. Please follow the instruction sheet given to you today for more information.  Your physician has recommended that you have a sleep study. This test records several body functions during sleep, including: brain activity, eye movement, oxygen and carbon dioxide blood levels, heart rate and rhythm, breathing rate and rhythm, the flow of air through your mouth and nose, snoring, body muscle movements, and chest and belly movement.  Follow-Up: At [facility], you and your health needs are our priority.  As part of our continuing mission to provide you with exceptional heart care, we have created designated Provider Care Teams.  These Care Teams include your primary Cardiologist (physician) and Advanced Practice Providers (APPs -  Physician Assistants and Nurse Practitioners) who all work together to provide you with the care you need, when you need it.  We recommend signing up for the patient portal called "MyChart".  Sign up information is provided on this After Visit Summary.  MyChart is used to connect with patients for Virtual Visits (Telemedicine).  Patients are able to view lab/test results, encounter notes, upcoming appointments, etc.  Non-urgent messages can be sent to your provider as well.   To learn more about what you can do with MyChart, go to [email or web address].    Your next appointment:   3 month(s)  The format for your next appointment:   In Person  Provider:   Little Ishikawa, MD       Signed, Little Ishikawa, MD  03/30/2021 5:19 PM    [facility]

## 2021-03-30 ENCOUNTER — Other Ambulatory Visit: Payer: Self-pay

## 2021-03-30 ENCOUNTER — Ambulatory Visit (INDEPENDENT_AMBULATORY_CARE_PROVIDER_SITE_OTHER): Payer: Self-pay | Admitting: Cardiology

## 2021-03-30 ENCOUNTER — Encounter: Payer: Self-pay | Admitting: Cardiology

## 2021-03-30 VITALS — BP 138/82 | HR 67 | Ht 72.0 in | Wt 281.0 lb

## 2021-03-30 DIAGNOSIS — I10 Essential (primary) hypertension: Secondary | ICD-10-CM

## 2021-03-30 DIAGNOSIS — I428 Other cardiomyopathies: Secondary | ICD-10-CM

## 2021-03-30 DIAGNOSIS — I5042 Chronic combined systolic (congestive) and diastolic (congestive) heart failure: Secondary | ICD-10-CM

## 2021-03-30 DIAGNOSIS — E785 Hyperlipidemia, unspecified: Secondary | ICD-10-CM

## 2021-03-30 DIAGNOSIS — R0683 Snoring: Secondary | ICD-10-CM

## 2021-03-30 MED ORDER — ROSUVASTATIN CALCIUM 10 MG PO TABS: 10.0000 mg | ORAL_TABLET | Freq: Every day | ORAL | 3 refills | Status: DC

## 2021-03-30 NOTE — Patient Instructions (Signed)
Medication Instructions:  STOP atorvastatin (Lipitor) START rosuvastatin (Crestor) 10 mg daily  *If you need a refill on your cardiac medications before your next appointment, please call your pharmacy*   Lab Work: Please return for labs 1-2 weeks prior to MRI (BMET, CBC)  Our in office lab hours are Monday-Friday 8:00-4:00, closed for lunch 12:45-1:45 pm.  No appointment needed.  Testing/Procedures: Your physician has requested that you have a cardiac MRI. Cardiac MRI uses a computer to create images of your heart as its beating, producing both still and moving pictures of your heart and major blood vessels. For further information please visit [email or web address]. Please follow the instruction sheet given to you today for more information.  Your physician has recommended that you have a sleep study. This test records several body functions during sleep, including: brain activity, eye movement, oxygen and carbon dioxide blood levels, heart rate and rhythm, breathing rate and rhythm, the flow of air through your mouth and nose, snoring, body muscle movements, and chest and belly movement.  Follow-Up: At [facility], you and your health needs are our priority.  As part of our continuing mission to provide you with exceptional heart care, we have created designated Provider Care Teams.  These Care Teams include your primary Cardiologist (physician) and Advanced Practice Providers (APPs -  Physician Assistants and Nurse Practitioners) who all work together to provide you with the care you need, when you need it.  We recommend signing up for the patient portal called "MyChart".  Sign up information is provided on this After Visit Summary.  MyChart is used to connect with patients for Virtual Visits (Telemedicine).  Patients are able to view lab/test results, encounter notes, upcoming appointments, etc.  Non-urgent messages can be sent to your provider as well.   To learn more about what you can do  with MyChart, go to [email or web address].    Your next appointment:   3 month(s)  The format for your next appointment:   In Person  Provider:   Little Ishikawa, MD

## 2021-04-01 ENCOUNTER — Telehealth: Payer: Self-pay | Admitting: *Deleted

## 2021-04-01 NOTE — Telephone Encounter (Signed)
-----   Message from Harvel Ricks, RN sent at 03/30/2021  5:13 PM EST ----- Regarding: sleep study Sleep study ordered per Dr. Bjorn Pippin

## 2021-04-01 NOTE — Telephone Encounter (Signed)
Patient notified of sleep study appointment. 

## 2021-04-05 ENCOUNTER — Telehealth: Payer: Self-pay | Admitting: Primary Care

## 2021-04-05 ENCOUNTER — Ambulatory Visit: Payer: Self-pay | Admitting: Clinical

## 2021-04-05 ENCOUNTER — Other Ambulatory Visit: Payer: Self-pay

## 2021-04-05 NOTE — Telephone Encounter (Signed)
Pt came to his appt on 04/05/21, waited for an hour, pt states he couldn't wait any longer and would like to reschedule his appt

## 2021-04-05 NOTE — Telephone Encounter (Signed)
I attempted to call this pt to reschedule, no answer, left vm

## 2021-04-06 ENCOUNTER — Encounter: Payer: Self-pay | Admitting: Family Medicine

## 2021-04-06 ENCOUNTER — Ambulatory Visit: Payer: Self-pay | Attending: Family Medicine | Admitting: Family Medicine

## 2021-04-06 ENCOUNTER — Telehealth: Payer: Self-pay

## 2021-04-06 VITALS — BP 98/64 | HR 73 | Ht 72.0 in | Wt 279.4 lb

## 2021-04-06 DIAGNOSIS — I110 Hypertensive heart disease with heart failure: Secondary | ICD-10-CM

## 2021-04-06 DIAGNOSIS — Z1211 Encounter for screening for malignant neoplasm of colon: Secondary | ICD-10-CM

## 2021-04-06 DIAGNOSIS — J328 Other chronic sinusitis: Secondary | ICD-10-CM

## 2021-04-06 DIAGNOSIS — J329 Chronic sinusitis, unspecified: Secondary | ICD-10-CM | POA: Insufficient documentation

## 2021-04-06 DIAGNOSIS — I5022 Chronic systolic (congestive) heart failure: Secondary | ICD-10-CM

## 2021-04-06 MED ORDER — FLUTICASONE PROPIONATE 50 MCG/ACT NA SUSP: 2.0000 | Freq: Every day | NASAL | 1 refills | Status: DC

## 2021-04-06 MED ORDER — CETIRIZINE HCL 10 MG PO TABS: 10.0000 mg | ORAL_TABLET | Freq: Every day | ORAL | 1 refills | Status: DC

## 2021-04-06 NOTE — Patient Instructions (Signed)

## 2021-04-06 NOTE — Progress Notes (Signed)
Subjective:  Patient ID: Ronald Young, male    DOB: Dec 03, 1970  Age: 50 y.o. MRN: [id number]  CC: Hypertension   HPI Ronald Young is a 50 y.o. year old male with a history of hypertension, mild bilateral carotid artery disease, nonischemic cardiomyopathy (EF 45 to 50% from echo of 06/2020).  Interval History: He has no chest pain, dyspnea, pedal edema and has been compliant with his medications. Does not get much exercise. Last seen by cardiology in 03/2021 at which time atorvastatin was switched to Crestor.  Cardiac MRI and labs were ordered by Cardiology Truman Hayward has upcoming appointment for this. Complains of sinus congestion and has been using Vicks nasal spray with no much relief.  He also endorses head congestion with no myalgias, fever.  He is also going psychotherapy sessions for management of major depressive disorder under the care of the LCSW. He has no additional concerns today. Past Medical History:  Diagnosis Date   Arthritis    Cardiomyopathy ([place]) 09/22/2014   Hypertension     Past Surgical History:  Procedure Laterality Date   RIGHT/LEFT HEART CATH AND CORONARY ANGIOGRAPHY N/A 12/23/2019   Procedure: RIGHT/LEFT HEART CATH AND CORONARY ANGIOGRAPHY;  Surgeon: Lorretta Harp, MD;  [facility];  Service: Cardiovascular;  Laterality: N/A;    Family History  Problem Relation Age of Onset   Hypertension Mother    Colon cancer Other     Allergies  Allergen Reactions   Strawberry Extract Hives    Outpatient Medications Prior to Visit  Medication Sig Dispense Refill   aspirin 81 MG chewable tablet Chew 1 tablet (81 mg total) by mouth daily. 30 tablet 0   carvedilol (COREG) 25 MG tablet Take 1.5 tablets (37.5 mg total) by mouth 2 (two) times daily with a meal. 270 tablet 3   dapagliflozin propanediol (FARXIGA) 10 MG TABS tablet Take 10 mg by mouth daily.     diclofenac Sodium (VOLTAREN) 1 % GEL Apply 2 g topically 4 (four) times daily. 100 g O    hydrALAZINE (APRESOLINE) 50 MG tablet Take 2 tablets (100 mg total) by mouth 3 (three) times daily. 540 tablet 3   isosorbide mononitrate (IMDUR) 30 MG 24 hr tablet Take 1 tablet (30 mg total) by mouth daily. 90 tablet 3   losartan (COZAAR) 50 MG tablet Take 1 tablet (50 mg total) by mouth daily. 360 tablet 1   Multiple Vitamins-Minerals (MULTIVITAMIN WITH MINERALS) tablet Take 1 tablet by mouth daily.     rosuvastatin (CRESTOR) 10 MG tablet Take 1 tablet (10 mg total) by mouth daily. 90 tablet 3   sacubitril-valsartan (ENTRESTO) 97-103 MG Take 1 tablet by mouth 2 (two) times daily. 180 tablet 3   spironolactone (ALDACTONE) 25 MG tablet Take 1 tablet (25 mg total) by mouth daily. 90 tablet 3   No facility-administered medications prior to visit.     ROS Review of Systems  Constitutional:  Negative for activity change and appetite change.  HENT:  Positive for congestion. Negative for sinus pressure and sore throat.   Eyes:  Negative for visual disturbance.  Respiratory:  Negative for cough, chest tightness and shortness of breath.   Cardiovascular:  Negative for chest pain and leg swelling.  Gastrointestinal:  Negative for abdominal distention, abdominal pain, constipation and diarrhea.  Endocrine: Negative.   Genitourinary:  Negative for dysuria.  Musculoskeletal:  Negative for joint swelling and myalgias.  Skin:  Negative for rash.  Allergic/Immunologic: Negative.   Neurological:  Negative for weakness, light-headedness and numbness.  Psychiatric/Behavioral:  Negative for dysphoric mood and suicidal ideas.    Objective:  BP 98/64   Pulse 73   Ht 6' (1.829 m)   Wt 279 lb 6.4 oz (126.7 kg)   SpO2 96%   BMI 37.89 kg/m   BP/Weight 04/06/2021 03/30/2021 12/29/2020  Systolic BP 98 138 131  Diastolic BP 64 82 85  Wt. (Lbs) 279.4 281 280.6  BMI 37.89 38.11 38.06      Physical Exam Constitutional:      Appearance: He is well-developed.  HENT:     Right Ear: Tympanic  membrane normal.     Left Ear: Tympanic membrane normal.     Mouth/Throat:     Mouth: Mucous membranes are moist.     Pharynx: Oropharynx is clear. No posterior oropharyngeal erythema.  Cardiovascular:     Rate and Rhythm: Normal rate.     Heart sounds: Normal heart sounds. No murmur heard. Pulmonary:     Effort: Pulmonary effort is normal.     Breath sounds: Normal breath sounds. No wheezing or rales.  Chest:     Chest wall: No tenderness.  Abdominal:     General: Bowel sounds are normal. There is no distension.     Palpations: Abdomen is soft. There is no mass.     Tenderness: There is no abdominal tenderness.  Musculoskeletal:        General: Normal range of motion.     Right lower leg: No edema.     Left lower leg: No edema.  Neurological:     Mental Status: He is alert and oriented to person, place, and time.  Psychiatric:        Mood and Affect: Mood normal.    CMP Latest Ref Rng & Units 11/18/2020 09/25/2020 09/10/2020  Glucose 65 - 99 mg/dL 98 90 97  BUN 6 - 24 mg/dL 16 19 17   Creatinine 0.76 - 1.27 mg/dL 6.26) 9.48(N  Sodium 134 - 144 mmol/L 143 141 142  Potassium 3.5 - 5.2 mmol/L 4.2 4.4 4.4  Chloride 96 - 106 mmol/L 106 106 105  CO2 20 - 29 mmol/L 20 20 20   Calcium 8.7 - 10.2 mg/dL 9.4 4.62 9.9  Total Protein 6.5 - 8.1 g/dL - - -  Total Bilirubin 0.3 - 1.2 mg/dL - - -  Alkaline Phos 38 - 126 U/L - - -  AST 15 - 41 U/L - - -  ALT 0 - 44 U/L - - -    Lipid Panel     Component Value Date/Time   CHOL 212 (H) 01/05/2021 0949   TRIG 124 01/05/2021 0949   HDL 39 (L) 01/05/2021 0949   CHOLHDL 5.4 (H) 01/05/2021 0949   CHOLHDL 6.5 12/21/2019 0443   VLDL 13 12/21/2019 0443   LDLCALC 150 (H) 01/05/2021 0949    CBC    Component Value Date/Time   WBC 7.6 12/24/2019 0542   RBC 3.93 (L) 12/24/2019 0542   HGB 11.1 (L) 12/24/2019 0542   HCT 34.3 (L) 12/24/2019 0542   PLT 326 12/24/2019 0542   MCV 87.3 12/24/2019 0542   MCH 28.2 12/24/2019 0542   MCHC 32.4  12/24/2019 0542   RDW 15.5 12/24/2019 0542   LYMPHSABS 2.2 12/20/2019 1003   MONOABS 0.5 12/20/2019 1003   EOSABS 0.2 12/20/2019 1003   BASOSABS 0.1 12/20/2019 1003    Lab Results  Component Value Date   HGBA1C 5.6 12/29/2020  Assessment & Plan:  1. Hypertensive heart disease with chronic systolic congestive heart failure ([place]) EF 45 to 50% from 06/2020 Euvolemic Continue guideline directed medical therapy - He is on Entresto, carvedilol, Farxiga, isosorbide, spironolactone Follow-up with cardiology  2. Screening for colon cancer - Fecal occult blood, imunochemical  3. Other chronic sinusitis Uncontrolled Will initiate nasal steroid and antihistamine. - fluticasone (FLONASE) 50 MCG/ACT nasal spray; Place 2 sprays into both nostrils daily.  Dispense: 16 g; Refill: 1 - cetirizine (ZYRTEC) 10 MG tablet; Take 1 tablet (10 mg total) by mouth daily.  Dispense: 30 tablet; Refill: 1    Meds ordered this encounter  Medications   fluticasone (FLONASE) 50 MCG/ACT nasal spray    Sig: Place 2 sprays into both nostrils daily.    Dispense:  16 g    Refill:  1   cetirizine (ZYRTEC) 10 MG tablet    Sig: Take 1 tablet (10 mg total) by mouth daily.    Dispense:  30 tablet    Refill:  1    Follow-up: Return in about 6 months (around 10/05/2021) for Chronic medical conditions.       Charlott Rakes, MD, FAAFP. [facility] [place], [place]   04/06/2021, 4:44 PM

## 2021-04-06 NOTE — Telephone Encounter (Signed)
Pt was seen today with Dr.Newlin and his PHQ-9 was high. It has been recorded in the chart.

## 2021-04-06 NOTE — Telephone Encounter (Signed)
Spoke with pt and rescheduled appt for 04/21/21. Pt denies SI. Stated sometimes he wishes he wasn't here. Pt stated that he still has crisis resources from previous sessions.LCSWA advised pt to utilize crisis resources if SI arises with plan, means, and intent.

## 2021-04-13 ENCOUNTER — Encounter (HOSPITAL_COMMUNITY): Payer: Self-pay | Admitting: Physician Assistant

## 2021-04-13 ENCOUNTER — Ambulatory Visit (INDEPENDENT_AMBULATORY_CARE_PROVIDER_SITE_OTHER): Payer: No Payment, Other | Admitting: Physician Assistant

## 2021-04-13 ENCOUNTER — Other Ambulatory Visit: Payer: Self-pay

## 2021-04-13 VITALS — BP 171/105 | HR 88 | Ht 72.0 in | Wt 275.0 lb

## 2021-04-13 DIAGNOSIS — F331 Major depressive disorder, recurrent, moderate: Secondary | ICD-10-CM | POA: Insufficient documentation

## 2021-04-13 DIAGNOSIS — G4700 Insomnia, unspecified: Secondary | ICD-10-CM | POA: Diagnosis not present

## 2021-04-13 DIAGNOSIS — F411 Generalized anxiety disorder: Secondary | ICD-10-CM

## 2021-04-13 MED ORDER — MELATONIN 10 MG PO TABS: 10.0000 mg | ORAL_TABLET | Freq: Every day | ORAL | 1 refills | Status: DC

## 2021-04-13 MED ORDER — ESCITALOPRAM OXALATE 10 MG PO TABS: 10.0000 mg | ORAL_TABLET | Freq: Every day | ORAL | 1 refills | Status: DC
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
## 2021-04-14 ENCOUNTER — Ambulatory Visit (INDEPENDENT_AMBULATORY_CARE_PROVIDER_SITE_OTHER): Payer: No Payment, Other | Admitting: Licensed Clinical Social Worker

## 2021-04-14 DIAGNOSIS — F331 Major depressive disorder, recurrent, moderate: Secondary | ICD-10-CM | POA: Diagnosis not present
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
## 2021-04-21 ENCOUNTER — Ambulatory Visit: Payer: MEDICAID | Attending: Primary Care | Admitting: Clinical

## 2021-04-21 ENCOUNTER — Other Ambulatory Visit: Payer: Self-pay

## 2021-04-21 DIAGNOSIS — F331 Major depressive disorder, recurrent, moderate: Secondary | ICD-10-CM

## 2021-04-28 ENCOUNTER — Other Ambulatory Visit: Payer: Self-pay

## 2021-04-28 ENCOUNTER — Ambulatory Visit (INDEPENDENT_AMBULATORY_CARE_PROVIDER_SITE_OTHER): Payer: No Payment, Other | Admitting: Licensed Clinical Social Worker

## 2021-04-28 DIAGNOSIS — F331 Major depressive disorder, recurrent, moderate: Secondary | ICD-10-CM | POA: Diagnosis not present
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
## 2021-05-05 ENCOUNTER — Telehealth (HOSPITAL_COMMUNITY): Payer: Self-pay | Admitting: *Deleted

## 2021-05-05 NOTE — Telephone Encounter (Signed)
Reaching out to patient to offer assistance regarding upcoming cardiac imaging study; pt verbalizes understanding of appt date/time, parking situation and where to check in, and verified current allergies; name and call back number provided for further questions should they arise  Larey Brick RN Navigator Cardiac Imaging [facility] [phone] office [phone] cell  He is aware to obtain labs for cardiac MRI and denies metal or claustrophobia.
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
## 2021-05-07 LAB — BASIC METABOLIC PANEL
BUN/Creatinine Ratio: 12 (ref 9–20)
BUN: 15 mg/dL (ref 6–24)
CO2: 22 mmol/L (ref 20–29)
Calcium: 9.7 mg/dL (ref 8.7–10.2)
Chloride: 105 mmol/L (ref 96–106)
Creatinine, Ser: 1.27 mg/dL (ref 0.76–1.27)
Glucose: 119 mg/dL — ABNORMAL HIGH (ref 70–99)
Potassium: 4 mmol/L (ref 3.5–5.2)
Sodium: 140 mmol/L (ref 134–144)
eGFR: 69 mL/min/{1.73_m2} (ref >59)

## 2021-05-07 LAB — CBC
Hematocrit: 40.8 % (ref 37.5–51.0)
Hemoglobin: 13.8 g/dL (ref 13.0–17.7)
MCH: 29.1 pg (ref 26.6–33.0)
MCHC: 33.8 g/dL (ref 31.5–35.7)
MCV: 86 fL (ref 79–97)
Platelets: 320 10*3/uL (ref 150–450)
RBC: 4.74 x10E6/uL (ref 4.14–5.80)
RDW: 12.3 % (ref 11.6–15.4)
WBC: 7.5 10*3/uL (ref 3.4–10.8)

## 2021-05-10 ENCOUNTER — Other Ambulatory Visit: Payer: Self-pay

## 2021-05-10 ENCOUNTER — Ambulatory Visit (HOSPITAL_COMMUNITY)
Admission: RE | Admit: 2021-05-10 | Discharge: 2021-05-10 | Disposition: A | Discharge status: Home / Self Care | Payer: Self-pay | Source: Ambulatory Visit | Attending: Cardiology | Admitting: Cardiology

## 2021-05-10 DIAGNOSIS — I5042 Chronic combined systolic (congestive) and diastolic (congestive) heart failure: Secondary | ICD-10-CM | POA: Insufficient documentation

## 2021-05-10 DIAGNOSIS — I428 Other cardiomyopathies: Secondary | ICD-10-CM | POA: Insufficient documentation

## 2021-05-10 IMAGING — MR MR CARD MORPHOLOGY WO/W CM
45 of 48 series · 45 of 48 positions shown · IV contrast (Contrast agent)
Comparison: none

CLINICAL DATA: Cardiomyopathy evaluation

EXAM:
CARDIAC MRI
TECHNIQUE: The patient was scanned on a 1.5 Tesla Siemens magnet. A dedicated
cardiac coil was used. Functional imaging was done using Fiesta
sequences. [DATE], and 4 chamber views were done to assess for RWMA's.
Modified JESSICAA rule using a short axis stack was used to
calculate an ejection fraction on a dedicated work station using
Circle software. The patient received 10 cc of Gadavist. After 10
minutes inversion recovery sequences were used to assess for
infiltration and scar tissue.
CONTRAST:  10 cc  of Gadavist

[Series 4: [id number]_bh · axial · 8.0mm · 1.60mm/px · 1 of 18 slices shown]
[im 1/18]
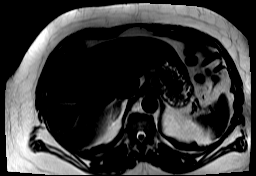

[Series 8: bSSFP · oblique · 8.0mm · 1.61mm/px · 1 of 25 slices shown (1 of 22)]
[im 1/25]
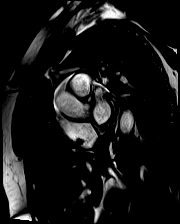

[Series 9: bSSFP · oblique · 8.0mm · 1.61mm/px · 1 of 25 slices shown (2 of 22)]
[im 1/25]
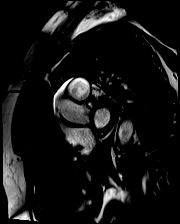

[Series 10: bSSFP · oblique · 8.0mm · 1.61mm/px · 1 of 25 slices shown (3 of 22)]
[im 1/25]
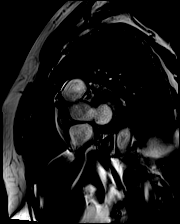

[Series 11: bSSFP · oblique · 8.0mm · 1.61mm/px · 1 of 25 slices shown (4 of 22)]
[im 1/25]
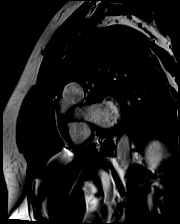

[Series 12: bSSFP · oblique · 8.0mm · 1.61mm/px · 1 of 25 slices shown (5 of 22)]
[im 1/25]
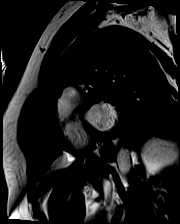

[Series 13: bSSFP · oblique · 8.0mm · 1.61mm/px · 1 of 25 slices shown (6 of 22)]
[im 1/25]
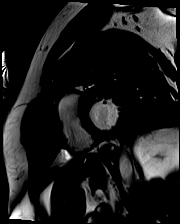

[Series 14: bSSFP · oblique · 8.0mm · 1.61mm/px · 1 of 25 slices shown (7 of 22)]
[im 1/25]
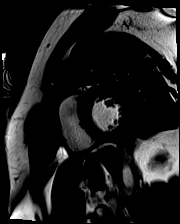

[Series 15: bSSFP · oblique · 8.0mm · 1.61mm/px · 1 of 25 slices shown (8 of 22)]
[im 1/25]
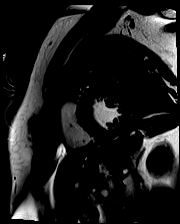

[Series 16: bSSFP · oblique · 8.0mm · 1.61mm/px · 1 of 25 slices shown (9 of 22)]
[im 1/25]
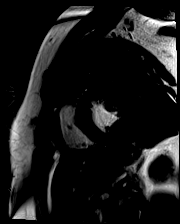

[Series 17: bSSFP · oblique · 8.0mm · 1.61mm/px · 1 of 25 slices shown (10 of 22)]
[im 1/25]
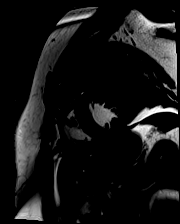

[Series 18: bSSFP · oblique · 8.0mm · 1.61mm/px · 1 of 25 slices shown (11 of 22)]
[im 1/25]
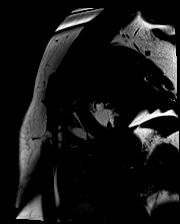

[Series 19: bSSFP · oblique · 8.0mm · 1.61mm/px · 1 of 25 slices shown (12 of 22)]
[im 1/25]
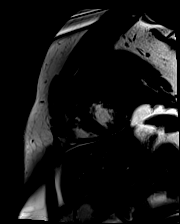

[Series 20: bSSFP · oblique · 8.0mm · 1.61mm/px · 1 of 25 slices shown (13 of 22)]
[im 1/25]
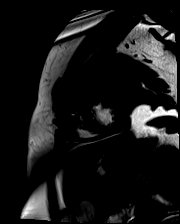

[Series 21: bSSFP · oblique · 8.0mm · 1.61mm/px · 1 of 25 slices shown (14 of 22)]
[im 1/25]
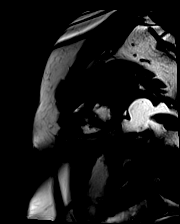

[Series 22: bSSFP · oblique · 8.0mm · 1.61mm/px · 1 of 25 slices shown (15 of 22)]
[im 1/25]
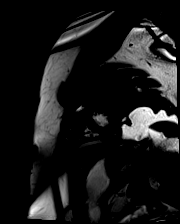

[Series 23: bSSFP · oblique · 8.0mm · 1.61mm/px · 1 of 25 slices shown (16 of 22)]
[im 1/25]
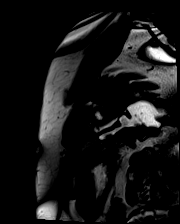

[Series 24: bSSFP · oblique · 8.0mm · 1.61mm/px · 1 of 25 slices shown (17 of 22)]
[im 1/25]
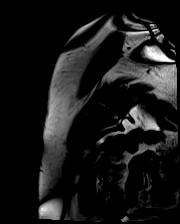

[Series 25: bSSFP · oblique · 8.0mm · 1.61mm/px · 1 of 25 slices shown (18 of 22)]
[im 1/25]
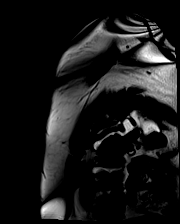

[Series 26: bSSFP · oblique · 6.0mm · 1.41mm/px · 1 of 25 slices shown (19 of 22)]
[im 1/25]
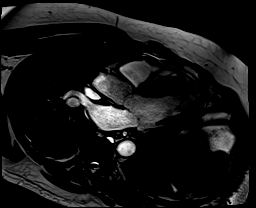

[Series 27: bSSFP · oblique · 6.0mm · 1.41mm/px · 1 of 25 slices shown (20 of 22)]
[im 1/25]
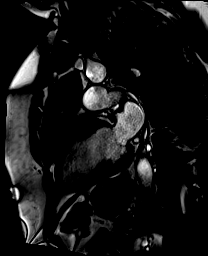

[Series 28: bSSFP · axial · 6.0mm · 1.41mm/px · 1 of 25 slices shown (21 of 22)]
[im 1/25]
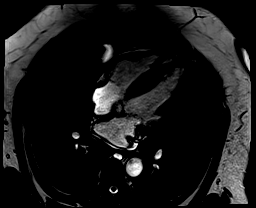

[Series 29: (id)_long_t1 · oblique · 8.0mm · 1.56mm/px · 1 of 24 slices shown]
[im 1/24]
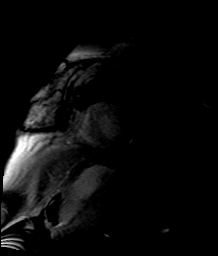

[Series 30: (id)_long_t1_moco · oblique · 8.0mm · 1.56mm/px · 1 of 24 slices shown]
[im 1/24]
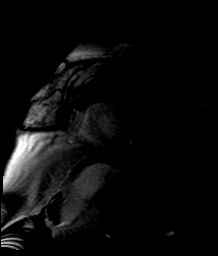

[Series 31: (id)_long_t1_moco_t1 · oblique · 8.0mm · 1.56mm/px · 1 of 6 slices shown]
[im 1/6]
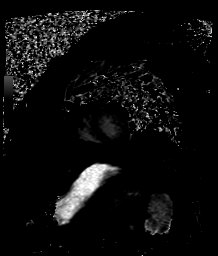

[Series 33: (id)_trufi · oblique · 8.0mm · 2.08mm/px · 1 of 9 slices shown]
[im 1/9]
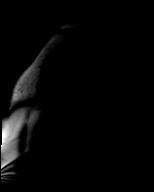

[Series 34: (id)_trufi_moco · oblique · 8.0mm · 2.08mm/px · 1 of 9 slices shown]
[im 1/9]
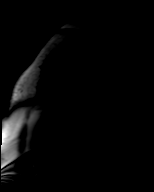

[Series 37: pre short axis · oblique · non-contrast · 8.0mm · 2.38mm/px · 1 of 10 slices shown (1 of 6)]
[im 1/10]
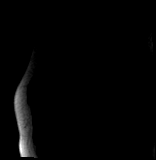

[Series 38: pre short axis · oblique · non-contrast · 8.0mm · 2.38mm/px · 1 of 10 slices shown (2 of 6)]
[im 1/10]
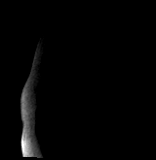

[Series 39: pre short axis · oblique · non-contrast · 8.0mm · 2.38mm/px · 1 of 10 slices shown (3 of 6)]
[im 1/10]
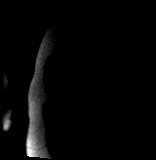

[Series 40: pre short axis · oblique · non-contrast · 8.0mm · 2.38mm/px · 1 of 10 slices shown (4 of 6)]
[im 1/10]
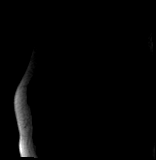

[Series 41: pre short axis · oblique · non-contrast · 8.0mm · 2.38mm/px · 1 of 10 slices shown (5 of 6)]
[im 1/10]
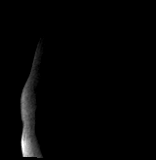

[Series 42: pre short axis · oblique · non-contrast · 8.0mm · 2.38mm/px · 1 of 10 slices shown (6 of 6)]
[im 1/10]
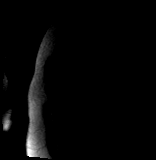

[Series 43: rest short axis · oblique · 8.0mm · 2.25mm/px · 1 of 60 slices shown (1 of 6)]
[im 1/60]
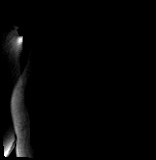

[Series 44: rest short axis · oblique · 8.0mm · 2.25mm/px · 1 of 60 slices shown (2 of 6)]
[im 1/60]
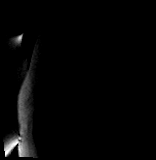

[Series 45: rest short axis · oblique · 8.0mm · 2.25mm/px · 1 of 60 slices shown (3 of 6)]
[im 1/60]
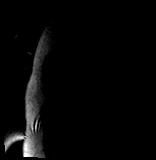

[Series 46: rest short axis · oblique · 8.0mm · 2.25mm/px · 1 of 60 slices shown (4 of 6)]
[im 1/60]
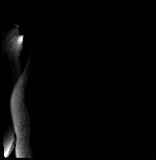

[Series 47: rest short axis · oblique · 8.0mm · 2.25mm/px · 1 of 60 slices shown (5 of 6)]
[im 1/60]
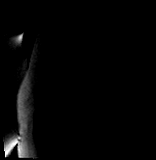

[Series 48: rest short axis · oblique · 8.0mm · 2.25mm/px · 1 of 60 slices shown (6 of 6)]
[im 1/60]
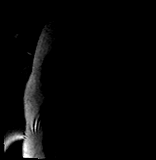

[Series 49: bSSFP · coronal · 6.0mm · 1.41mm/px · 1 of 25 slices shown (22 of 22)]
[im 1/25]
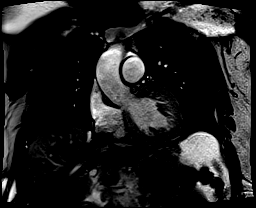

[Series 50: cine rvit · oblique · 6.0mm · 1.41mm/px · 1 of 25 slices shown]
[im 1/25]
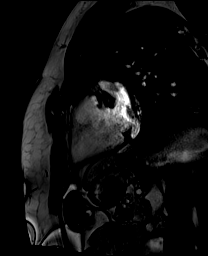

[Series 51: aortic valve cine · oblique · 6.0mm · 1.41mm/px · 1 of 25 slices shown]
[im 1/25]
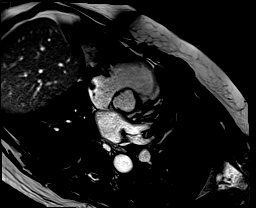

[Series 52: cine rvot · sagittal · 6.0mm · 1.56mm/px · 1 of 25 slices shown]
[im 1/25]
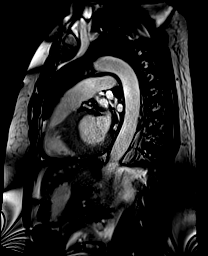

[Series 55: lge_single shot sa · oblique · 8.0mm · 2.19mm/px · 1 of 18 slices shown (1 of 2)]
[im 1/18]
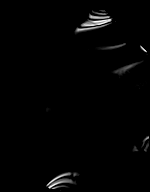

[Series 56: lge_single shot sa · oblique · 8.0mm · 2.19mm/px · 1 of 18 slices shown (2 of 2)]
[im 1/18]
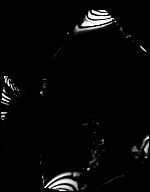

[45 of 48 positions shown; findings below may reference images not displayed]

FINDINGS: Left ventricle:

-Severe asymmetric LVH measuring 20mm in basal septum (13mm in
posterior wall)

-Normal size

-Normal systolic function

-Unable to calculate ECV as post contrast T1 maps were not done

-Subendocardial LGE in apical inferior wall, suggesting small
infarct

-Patchy LGE in basal septum and RV insertion site. LGE accounts for
13% of toal myocardial mass

LV EF: 52% (Normal 56-78%)

Absolute volumes:

LV EDV: 200mL (Normal 77-195 mL)

LV ESV: 95mL (Normal 19-72 mL)

LV SV: 105mL (Normal 51-133 mL)

CO: 5.9L/min (Normal 2.8-8.8 L/min)

Indexed volumes:

LV EDV: 79mL/sq-m (Normal 47-92 mL/sq-m)

LV ESV: 38mL/sq-m (Normal 13-30 mL/sq-m)

LV SV: 42mL/sq-m (Normal 32-62 mL/sq-m)

CI: 2.3L/min/sq-m (Normal 1.7-4.2 L/min/sq-m)

Right ventricle: Normal size and systolic function

RV EF:  52% (Normal 47-74%)

Absolute volumes:

RV EDV: 210mL (Normal 88-227 mL)

RV ESV: 100mL (Normal 23-103 mL)

RV SV: 109mL (Normal 52-138 mL)

CO: 6.1L/min (Normal 2.8-8.8 L/min)

Indexed volumes:

RV EDV: 83mL/sq-m (Normal 55-105 mL/sq-m)

RV ESV: 40mL/sq-m (Normal 15-43 mL/sq-m)

RV SV: 43mL/sq-m (Normal 32-64 mL/sq-m)

CI: 2.4L/min/sq-m (Normal 1.7-4.2 L/min/sq-m)

Left atrium: Mild enlargement

Right atrium: Normal size

Mitral valve: Trivial regurgitation

Aortic valve: Tricuspid.  No regurgitation

Tricuspid valve: Trivial regurgitation

Pulmonic valve: No regurgitation

Aorta: Normal proximal ascending aorta

Pulmonary artery: Dilated main PA measuring 32mm

Pericardium: Normal
IMPRESSION: 1. Severe asymmetric LV hypertrophy measuring 20mm in basal septum
(13mm in posterior wall), consistent with hypertrophic
cardiomyopathy

2. Patchy LGE in basal septum and RV insertion site, consistent with
HCM. LGE accounts for 13% of total myocardial mass

3. Subendocardial LGE in apical inferior wall, suggesting small
infarct

4.  Normal LV size and systolic function (EF 52%)

5.  Normal RV size and systolic function (EF 52%)

6.  Unable to calculate ECV as post contrast T1 maps were not done

7.  Dilated main pulmonary artery measuring 32mm

## 2021-05-10 MED ORDER — GADOBUTROL 1 MMOL/ML IV SOLN
10.0000 mL | Freq: Once | INTRAVENOUS | Status: AC | PRN
  Administered 2021-05-10: 10 mL via INTRAVENOUS

## 2021-05-16 ENCOUNTER — Other Ambulatory Visit: Payer: Self-pay

## 2021-05-16 ENCOUNTER — Ambulatory Visit (HOSPITAL_BASED_OUTPATIENT_CLINIC_OR_DEPARTMENT_OTHER): Payer: Self-pay | Attending: Cardiology | Admitting: Cardiovascular Disease

## 2021-05-16 DIAGNOSIS — I428 Other cardiomyopathies: Secondary | ICD-10-CM | POA: Insufficient documentation

## 2021-05-16 DIAGNOSIS — I10 Essential (primary) hypertension: Secondary | ICD-10-CM

## 2021-05-16 DIAGNOSIS — R0683 Snoring: Secondary | ICD-10-CM | POA: Insufficient documentation

## 2021-05-16 DIAGNOSIS — I110 Hypertensive heart disease with heart failure: Secondary | ICD-10-CM | POA: Insufficient documentation

## 2021-05-16 DIAGNOSIS — I5042 Chronic combined systolic (congestive) and diastolic (congestive) heart failure: Secondary | ICD-10-CM | POA: Insufficient documentation

## 2021-05-18 ENCOUNTER — Other Ambulatory Visit: Payer: Self-pay

## 2021-05-18 ENCOUNTER — Encounter: Payer: Self-pay | Admitting: *Deleted

## 2021-05-18 ENCOUNTER — Ambulatory Visit (INDEPENDENT_AMBULATORY_CARE_PROVIDER_SITE_OTHER): Payer: Self-pay

## 2021-05-18 DIAGNOSIS — I422 Other hypertrophic cardiomyopathy: Secondary | ICD-10-CM

## 2021-05-18 NOTE — Progress Notes (Unsigned)
Enrolled for [employer] to mail a ZIO XT long term holter monitor to the patients address on file.  Letter mailed to patient with instructions and Self Pay Discount Program information.

## 2021-05-19 ENCOUNTER — Ambulatory Visit (HOSPITAL_COMMUNITY): Payer: No Payment, Other | Admitting: Licensed Clinical Social Worker

## 2021-05-26 DIAGNOSIS — I422 Other hypertrophic cardiomyopathy: Secondary | ICD-10-CM

## 2021-05-28 ENCOUNTER — Telehealth (INDEPENDENT_AMBULATORY_CARE_PROVIDER_SITE_OTHER): Payer: No Payment, Other | Admitting: Family

## 2021-05-28 DIAGNOSIS — F331 Major depressive disorder, recurrent, moderate: Secondary | ICD-10-CM

## 2021-05-28 NOTE — Progress Notes (Signed)
Virtual Visit via Telephone Note  I connected with Ronald Young on 06/01/21 at  9:30 AM EST by telephone and verified that I am speaking with the correct person using two identifiers.  Location: Patient: Home Provider: Office   I discussed the limitations, risks, security and privacy concerns of performing an evaluation and management service by telephone and the availability of in person appointments. I also discussed with the patient that there may be a patient responsible charge related to this service. The patient expressed understanding and agreed to proceed.    I discussed the assessment and treatment plan with the patient. The patient was provided an opportunity to ask questions and all were answered. The patient agreed with the plan and demonstrated an understanding of the instructions.   The patient was advised to call back or seek an in-person evaluation if the symptoms worsen or if the condition fails to improve as anticipated.  I provided 15 minutes of non-face-to-face time during this encounter.   Ronald Rack, NP   [facility] MD/PA/NP OP Progress Note  06/01/2021 1:47 PM Ronald Young  MRN:  [id number]  Chief Complaint: " Iam doing alright"  HPI: Ronald Young was evaluated telephonically.  Patient has a charted history of major depressive disorder.  He is currently prescribed Lexapro 10 mg p.o. daily.  Patient denies that he has been taking medications as directed.  States " I do and I do not need the medication but I have not been taking it like that."  Discussed ongoing therapy services.  We will discontinue Lexapro at this time. Patient was receptive to plan. Denies depression or depressive symptoms currently.    Rates his depression 0 out of 10 with 10 being the worst.  States mild anxiety 2 out of 10.  Denied illicit drug use or substance abuse currently. Ronald Young reports a good appetite.  States he is resting well throughout the night.  Denies depression or depressive  symptoms.  Denied suicidal or homicidal ideations.  Denies auditory or visual hallucinations.  Visit Diagnosis:    ICD-10-CM   1. Moderate episode of recurrent major depressive disorder (HCC)  F33.1       Past Psychiatric History:  Past Medical History:  Past Medical History:  Diagnosis Date   Arthritis    Cardiomyopathy (HCC) 09/22/2014   Hypertension     Past Surgical History:  Procedure Laterality Date   RIGHT/LEFT HEART CATH AND CORONARY ANGIOGRAPHY N/A 12/23/2019   Procedure: RIGHT/LEFT HEART CATH AND CORONARY ANGIOGRAPHY;  Surgeon: Runell Gess, MD;  Location: [facility];  Service: Cardiovascular;  Laterality: N/A;    Family Psychiatric History:   Family History:  Family History  Problem Relation Age of Onset   Hypertension Mother    Colon cancer Other     Social History:  Social History   Socioeconomic History   Marital status: Single    Spouse name: Not on file   Number of children: Not on file   Years of education: Not on file   Highest education level: Not on file  Occupational History   Not on file  Tobacco Use   Smoking status: Former    Types: Cigarettes   Smokeless tobacco: Never  Vaping Use   Vaping Use: Never used  Substance and Sexual Activity   Alcohol use: Not Currently   Drug use: Yes    Types: Marijuana   Sexual activity: Not on file  Other Topics Concern   Not on file  Social History Narrative   Not on file   Social Determinants of Health   Financial Resource Strain: Medium Risk   Difficulty of Paying Living Expenses: Somewhat hard  Food Insecurity: No Food Insecurity   Worried About Programme researcher, broadcasting/film/video in the Last Year: Never true   Ran Out of Food in the Last Year: Never true  Transportation Needs: No Transportation Needs   Lack of Transportation (Medical): No   Lack of Transportation (Non-Medical): No  Physical Activity: Not on file  Stress: Not on file  Social Connections: Not on file    Allergies:   Allergies  Allergen Reactions   Strawberry Extract Hives    Metabolic Disorder Labs: Lab Results  Component Value Date   HGBA1C 5.6 12/29/2020   MPG 120 12/24/2019   MPG 123 09/22/2014   No results found for: PROLACTIN Lab Results  Component Value Date   CHOL 212 (H) 01/05/2021   TRIG 124 01/05/2021   HDL 39 (L) 01/05/2021   CHOLHDL 5.4 (H) 01/05/2021   VLDL 13 12/21/2019   LDLCALC 150 (H) 01/05/2021   LDLCALC 142 (H) 12/21/2019     Therapeutic Level Labs: No results found for: LITHIUM No results found for: VALPROATE No components found for:  CBMZ  Current Medications: Current Outpatient Medications  Medication Sig Dispense Refill   aspirin 81 MG chewable tablet Chew 1 tablet (81 mg total) by mouth daily. 30 tablet 0   carvedilol (COREG) 25 MG tablet Take 1.5 tablets (37.5 mg total) by mouth 2 (two) times daily with a meal. 270 tablet 3   cetirizine (ZYRTEC) 10 MG tablet Take 1 tablet (10 mg total) by mouth daily. 30 tablet 1   dapagliflozin propanediol (FARXIGA) 10 MG TABS tablet Take 10 mg by mouth daily.     diclofenac Sodium (VOLTAREN) 1 % GEL Apply 2 g topically 4 (four) times daily. 100 g O   fluticasone (FLONASE) 50 MCG/ACT nasal spray Place 2 sprays into both nostrils daily. 16 g 1   hydrALAZINE (APRESOLINE) 50 MG tablet Take 2 tablets (100 mg total) by mouth 3 (three) times daily. 540 tablet 3   isosorbide mononitrate (IMDUR) 30 MG 24 hr tablet Take 1 tablet (30 mg total) by mouth daily. 90 tablet 3   losartan (COZAAR) 50 MG tablet Take 1 tablet (50 mg total) by mouth daily. 360 tablet 1   Melatonin 10 MG TABS Take 10 mg by mouth at bedtime. 30 tablet 1   Multiple Vitamins-Minerals (MULTIVITAMIN WITH MINERALS) tablet Take 1 tablet by mouth daily.     rosuvastatin (CRESTOR) 10 MG tablet Take 1 tablet (10 mg total) by mouth daily. 90 tablet 3   sacubitril-valsartan (ENTRESTO) 97-103 MG Take 1 tablet by mouth 2 (two) times daily. 180 tablet 3   spironolactone  (ALDACTONE) 25 MG tablet Take 1 tablet (25 mg total) by mouth daily. 90 tablet 3   No current facility-administered medications for this visit.     Musculoskeletal:    Psychiatric Specialty Exam: Review of Systems  There were no vitals taken for this visit.There is no height or weight on file to calculate BMI.  General Appearance: NA  Eye Contact:  NA  Speech:  Clear and Coherent  Volume:  Normal  Mood:  Euthymic  Affect:  Congruent  Thought Process:  Coherent  Orientation:  Full (Time, Place, and Person)  Thought Content: Logical   Suicidal Thoughts:  No  Homicidal Thoughts:  No  Memory:  Immediate;  Good Recent;   Good  Judgement:  Good  Insight:  Good  Psychomotor Activity:  Normal  Concentration:  Concentration: Good  Recall:  Good  Fund of Knowledge: Good  Language: Good  Akathisia:  No  Handed:  Right  AIMS (if patient is cleared evaluated): done  Assets:  Communication Skills Desire for Improvement Resilience Social Support  ADL's:  Intact  Cognition: WNL  Sleep:  Good   Screenings: GAD-7    Loss adjuster, chartered Office Visit from 04/13/2021 in [facility] Office Visit from 04/06/2021 in [facility] from 03/01/2021 in [facility] from 02/15/2021 in [facility] from 01/27/2021 in [facility]  Total GAD-7 Score 16 13 16 13 15       PHQ2-9    Flowsheet Row Office Visit from 04/13/2021 in [facility] Office Visit from 04/06/2021 in [facility] from 03/01/2021 in [facility] from 02/15/2021 in [facility] Counselor from 02/09/2021 in [place]  PHQ-2 Total Score 5 4 5 4 5   PHQ-9 Total Score 15 14 18 14 18       Flowsheet Row Office Visit from 04/13/2021 in [facility] from 02/15/2021 in [facility] Counselor from 02/09/2021 in [facility]  C-SSRS RISK CATEGORY No Risk Low Risk Low Risk        Assessment and Plan: Ronald Young is a 51 year old male who has a charted history with depression and anxiety.  Reports his mood has improved.  patient was initiated on Lexapro at his previous visit however he denied that he has been taking medications as indicated.  States " I do not feel like I need any medications."  He denied depression or depressive symptoms.  Discussed discontinuing medications.  Patient encouraged to keep all outpatient follow-up therapy appointments.  He was receptive to plan.  Patient to make follow-up appointment as needed.  Support, encouragement and  reassurance was provided.  Depression disorder:  Generalized anxiety disorder:  Discontinue Lexapro 10 mg p.o. daily  Patient to follow-up as needed  [facility], NP 06/01/2021, 1:47 PM

## 2021-06-01 ENCOUNTER — Encounter (HOSPITAL_COMMUNITY): Payer: Self-pay | Admitting: Family

## 2021-06-02 ENCOUNTER — Telehealth: Payer: Self-pay | Admitting: Cardiology

## 2021-06-02 NOTE — Telephone Encounter (Signed)
°*  STAT* If patient is at the pharmacy, call can be transferred to refill team.   1. Which medications need to be refilled? (please list name of each medication and dose if known)  Carvedilol, Isosorbide Hydrazaline, Losartan  and Spironolactone   2. Which pharmacy/location (including street and city if local pharmacy) is medication to be sent to?  [address], [place]  3. Do they need a 30 day or 90 day supply? 90 days

## 2021-06-03 ENCOUNTER — Other Ambulatory Visit: Payer: Self-pay

## 2021-06-03 DIAGNOSIS — I10 Essential (primary) hypertension: Secondary | ICD-10-CM

## 2021-06-03 MED ORDER — ISOSORBIDE MONONITRATE ER 30 MG PO TB24
30.0000 mg | ORAL_TABLET | Freq: Every day | ORAL | 3 refills | Status: DC
  Filled 2021-06-03: qty 30, 30d supply, fill #0

## 2021-06-03 MED ORDER — LOSARTAN POTASSIUM 50 MG PO TABS
50.0000 mg | ORAL_TABLET | Freq: Every day | ORAL | 1 refills | Status: DC
  Filled 2021-06-03: qty 30, 30d supply, fill #0

## 2021-06-03 MED ORDER — CARVEDILOL 25 MG PO TABS
37.5000 mg | ORAL_TABLET | Freq: Two times a day (BID) | ORAL | 3 refills | Status: DC
  Filled 2021-06-03: qty 90, 30d supply, fill #0

## 2021-06-03 MED ORDER — HYDRALAZINE HCL 50 MG PO TABS
100.0000 mg | ORAL_TABLET | Freq: Three times a day (TID) | ORAL | 3 refills | Status: DC
  Filled 2021-06-03: qty 180, 30d supply, fill #0

## 2021-06-03 MED ORDER — SPIRONOLACTONE 25 MG PO TABS
25.0000 mg | ORAL_TABLET | Freq: Every day | ORAL | 3 refills | Status: DC
  Filled 2021-06-03: qty 30, 30d supply, fill #0

## 2021-06-04 ENCOUNTER — Telehealth: Payer: Self-pay | Admitting: Cardiology

## 2021-06-04 DIAGNOSIS — I10 Essential (primary) hypertension: Secondary | ICD-10-CM

## 2021-06-04 MED ORDER — CARVEDILOL 25 MG PO TABS: 37.5000 mg | ORAL_TABLET | Freq: Two times a day (BID) | ORAL | 3 refills | Status: DC

## 2021-06-04 MED ORDER — ISOSORBIDE MONONITRATE ER 30 MG PO TB24: 30.0000 mg | ORAL_TABLET | Freq: Every day | ORAL | 3 refills | Status: DC

## 2021-06-04 MED ORDER — LOSARTAN POTASSIUM 50 MG PO TABS: 50.0000 mg | ORAL_TABLET | Freq: Every day | ORAL | 3 refills | Status: DC

## 2021-06-04 MED ORDER — HYDRALAZINE HCL 50 MG PO TABS: 100.0000 mg | ORAL_TABLET | Freq: Three times a day (TID) | ORAL | 3 refills | Status: DC

## 2021-06-04 MED ORDER — SPIRONOLACTONE 25 MG PO TABS: 25.0000 mg | ORAL_TABLET | Freq: Every day | ORAL | 3 refills | Status: DC

## 2021-06-04 NOTE — Telephone Encounter (Signed)
Spoke to patient he stated prescriptions sent to wrong pharmacy.He needs medications listed below sent to [facility].90 day refills sent.

## 2021-06-04 NOTE — Telephone Encounter (Signed)
°*  STAT* If patient is at the pharmacy, call can be transferred to refill team.   1. Which medications need to be refilled? (please list name of each medication and dose if known) carvedilol (COREG) 25 MG tablet; hydrALAZINE (APRESOLINE) 50 MG tablet; isosorbide mononitrate (IMDUR) 30 MG 24 hr tablet; losartan (COZAAR) 50 MG tablet; spironolactone (ALDACTONE) 25 MG tablet    2. Which pharmacy/location (including street and city if local pharmacy) is medication to be sent to? [place] ([place] - [address]  3. Do they need a 30 day or 90 day supply? [facility]

## 2021-06-06 ENCOUNTER — Encounter (HOSPITAL_BASED_OUTPATIENT_CLINIC_OR_DEPARTMENT_OTHER): Payer: Self-pay | Admitting: Cardiovascular Disease

## 2021-06-06 NOTE — Procedures (Signed)
Patient Name: Ronald Young, Ronald Young Date: 05/16/2021 Gender: Male D.O.B: [birth year] Age (years): 50 Referring Provider: Epifanio Lesches Height (inches): 72 Interpreting Physician: Nicki Guadalajara MD, ABSM Weight (lbs): 280 RPSGT: Armen Pickup BMI: 38 MRN: [id number] Neck Size: 20.00  CLINICAL INFORMATION Sleep Study Type: NPSG  Indication for sleep study: Hypertension, Obesity, Snoring  Epworth Sleepiness Score: 4  SLEEP STUDY TECHNIQUE As per the AASM Manual for the Scoring of Sleep and Associated Events v2.3 (April 2016) with a hypopnea requiring 4% desaturations.  The channels recorded and monitored were frontal, central and occipital EEG, electrooculogram (EOG), submentalis EMG (chin), nasal and oral airflow, thoracic and abdominal wall motion, anterior tibialis EMG, snore microphone, electrocardiogram, and pulse oximetry.  MEDICATIONS aspirin 81 MG chewable tablet carvedilol (COREG) 25 MG tablet cetirizine (ZYRTEC) 10 MG tablet dapagliflozin propanediol (FARXIGA) 10 MG TABS tablet diclofenac Sodium (VOLTAREN) 1 % GEL fluticasone (FLONASE) 50 MCG/ACT nasal spray hydrALAZINE (APRESOLINE) 50 MG tablet isosorbide mononitrate (IMDUR) 30 MG 24 hr tablet losartan (COZAAR) 50 MG tablet Melatonin 10 MG TABS Multiple Vitamins-Minerals (MULTIVITAMIN WITH MINERALS) tablet rosuvastatin (CRESTOR) 10 MG tablet sacubitril-valsartan (ENTRESTO) 97-103 MG spironolactone (ALDACTONE) 25 MG tablet Medications self-administered by patient taken the night of the study : N/A  SLEEP ARCHITECTURE The study was initiated at 10:03:32 PM and ended at 4:37:16 AM.  Sleep onset time was 82.3 minutes and the sleep efficiency was 58.3%%. The total sleep time was 229.5 minutes.  Stage REM latency was 79.0 minutes.  The patient spent 5.4%% of the night in stage N1 sleep, 78.2%% in stage N2 sleep, 3.7%% in stage N3 and 12.6% in REM.  Alpha intrusion was absent.  Supine sleep was  72.35%.  RESPIRATORY PARAMETERS The overall apnea/hypopnea index (AHI) was 0.0 per hour. The respiratory disturbance index (RDI) was 0.3. There were 0 total apneas, including 0 obstructive, 0 central and 0 mixed apneas. There were 0 hypopneas and 1 RERAs.  The AHI during Stage REM sleep was 0.0 per hour.  AHI while supine was 0.0 per hour.  The mean oxygen saturation was 93.1%. The minimum SpO2 during sleep was 87.0%.  Moderate snoring was noted during this study.  CARDIAC DATA The 2 lead EKG demonstrated sinus rhythm. The mean heart rate was 81.9 beats per minute. Other EKG findings include: None.  LEG MOVEMENT DATA The total PLMS were 0 with a resulting PLMS index of 0.0. Associated arousal with leg movement index was 0.0 .  IMPRESSIONS - No significant obstructive sleep apnea occurred during this study (AHI 0.0/h). - Mild oxygen desaturation to a nadir of 87%. - The patient snored with moderate snoring volume. - Reduced sleep efficienbcy at 58.3% - No cardiac abnormalities were noted during this study. - Clinically significant periodic limb movements did not occur during sleep. No significant associated   DIAGNOSIS - Snoring - Nocturnal Hypoxemia (G47.36)  RECOMMENDATIONS - Effort should be made to optimize nasal and oropharyngeal patency. - Consider alternatives for the treatment of snoring.  - Avoid alcohol, sedatives and other CNS depressants that may worsen sleep apnea and disrupt normal sleep architecture. - Sleep hygiene should be reviewed to assess factors that may improve sleep quality. - Weight management (BMI 38) and regular exercise should be initiated or continued if appropriate.  [Electronically signed] 06/06/2021 06:08 PM  Nicki Guadalajara MD, [facility], ABSM Diplomate, American Board of Sleep Medicine   NPI: [id number] [facility] PH: [phone]   FX: [phone] ACCREDITED BY THE AMERICAN ACADEMY OF  SLEEP MEDICINE

## 2021-06-09 ENCOUNTER — Other Ambulatory Visit: Payer: Self-pay

## 2021-06-18 ENCOUNTER — Telehealth: Payer: Self-pay | Admitting: Cardiology

## 2021-06-18 ENCOUNTER — Other Ambulatory Visit: Payer: Self-pay

## 2021-06-18 ENCOUNTER — Telehealth: Payer: Self-pay | Admitting: *Deleted

## 2021-06-18 NOTE — Telephone Encounter (Signed)
Spoke with patient who is waiting on patient assistance form for entresto.

## 2021-06-18 NOTE — Telephone Encounter (Signed)
-----   Message from Lennette Bihari, MD sent at 06/06/2021  6:16 PM EST ----- Ronald Young please notify the patient of the results of the study.  Essentially normal study.  There is moderate snoring.  Reduced sleep efficiency.  Consider alternatives for snoring including ENT eval if necessary

## 2021-06-18 NOTE — Telephone Encounter (Signed)
Patient is called stating some papers were supposed to be sent to him for his sacubitril-valsartan (ENTRESTO) 97-103 MG.  He said he never received the patient assistance forms.

## 2021-06-18 NOTE — Telephone Encounter (Signed)
Patient notified of sleep study results. 

## 2021-06-22 NOTE — Telephone Encounter (Signed)
Spoke with patient who states that he just needs a copy of Entresto patient assistance application mailed to him. Advised patient that his has been placed in the mail and to fill out patient portion and return to office. Patient verbalized understanding.

## 2021-06-28 ENCOUNTER — Telehealth: Payer: Self-pay | Admitting: Licensed Clinical Social Worker

## 2021-06-28 NOTE — Telephone Encounter (Signed)
Mailed pt new CAFA and [employer].  Included my card, will assist as needed w/ re-completion.  Previous approval expires 07/13/21.   [name], MSW, [place]  [phone]- work cell phone (preferred) [phone]- desk phone

## 2021-07-01 ENCOUNTER — Other Ambulatory Visit: Payer: Self-pay

## 2021-07-01 ENCOUNTER — Ambulatory Visit (INDEPENDENT_AMBULATORY_CARE_PROVIDER_SITE_OTHER): Payer: No Payment, Other | Admitting: Licensed Clinical Social Worker

## 2021-07-01 DIAGNOSIS — F330 Major depressive disorder, recurrent, mild: Secondary | ICD-10-CM

## 2021-07-02 ENCOUNTER — Telehealth: Payer: Self-pay | Admitting: Cardiology

## 2021-07-02 ENCOUNTER — Other Ambulatory Visit: Payer: Self-pay

## 2021-07-02 MED ORDER — ENTRESTO 97-103 MG PO TABS: 1.0000 | ORAL_TABLET | Freq: Two times a day (BID) | ORAL | 0 refills | Status: DC

## 2021-07-02 NOTE — Telephone Encounter (Signed)
Informed patient that his entresto application will be signed by Dr. Bjorn Pippin on Monday and will be faxed. Sent prescription for 15 days to patient's pharmacy. ?

## 2021-07-02 NOTE — Telephone Encounter (Signed)
Patient calling the office for samples of medication: ? ? ?1.  What medication and dosage are you requesting samples for? ?sacubitril-valsartan (ENTRESTO) 97-103 MG ? ?2.  Are you currently out of this medication? Yes. Patient has been out since Monday  ? ?Patient is also waiting to hear the status of his Sherryll Burger application  ?  ?

## 2021-07-03 NOTE — Progress Notes (Signed)
? ?THERAPIST PROGRESS NOTE ? ?Session Time: 46 min ? ?Participation Level: Active ? ?Behavioral Response: CasualAlertmildly dep ? ?Type of Therapy: Individual Therapy ? ?Treatment Goals addressed: dep/stressors/grief/coping ? ?ProgressTowards Goals: Progressing ? ?Interventions: Solution Focused, Supportive, and Other: grief counseling ? ?Summary: Ronald Young is a 51 y.o. male who presents with hx of MDD. Today pt returns for in person session. Last completed session 04/28/21. Pt called to cancel last appt same day so listed as no show. Pt apologizes. Per chart review pt has discontinued antidepressant, however, when this was addressed with pt he states he is still taking med and did not say what is in NP's note. Pt states "That's why I don't like to see different people because it's too easy for things to go wrong". LCSW notes pt's next appt with Eddie, which pt reports he was unaware of and cannot attend d/t timing. LCSW facilitates appropriate change and communicates confusion to [facility] via secure chat post session. LCSW assures pt situation will be managed and apologizes for confusion. Overall assessment of pt's mood and coping reveals pt feels much improved since starting counseling/meds. He reports the processing of his past in counseling has been very impactful and positive. Pt advises he recently ran into Candy's mother. He states they had a good talk and he brought up Candy and any "hard feelings" mother may have. She denies any hard feelings and pt reports it was comforting to hear this again. LCSW assessed for pt's follow through on additional letter writing to [place], mother, self. Pt advises he did write more to [place]. He has talked openly with mother but not written to her and has not written to self. Forgiveness of self was further addressed and processed. Pt states "it's 50/50" r/t forgiving self. Explored barriers. Pt reports he would tell a friend in the same exact situation they should forgive  themselves and with this exercise concludes he should be more forgiving of himself. He agrees to continue to process feelings r/t to forgiveness. LCSW assessed for any plans pt may have r/t his upcoming 51th bday. This was discussed in some detail. Pt reports wishing he had a relationship with his 50 yr old identical twin dtrs, Tamika who is in [place] who is in [place]. Chancelor reports he did not raise them in part d/t his incarceration. He also states the mother and her side of the family are very "mean and disrespectful" in their demeanors, thus his dtrs behave this way. He states this is opposite from the way his fam functions and there is tremendous strain in the relationships. He states he is not in touch with his girls but his mother is and they are disrespectful to her, which disturbs him. Despite this he wishes he could go to dinner with his mother and twins for his bday. There is agreement to continue to address desired relationships with twins as time in this session expires. LCSW reviewed poc including scheduling prior to close of session. Pt states appreciation for care.   ? ?Suicidal/Homicidal: Nowithout intent/plan ? ?Therapist Response: Pt receptive to care ? ?Plan: Return again in ~4 weeks. ? ?Diagnosis: MDD (major depressive disorder), recurrent episode, mild ([place]) ? ?Collaboration of Care: Medication Management AEB Eddie, PA ? ?Patient/Guardian was advised Release of Information must be obtained prior to any record release in order to collaborate their care with an outside provider. Patient/Guardian was advised if they have not already done so to contact the registration department to sign all  necessary forms in order for [place] to release information regarding their care.  ? ?Consent: Patient/Guardian gives verbal consent for treatment and assignment of benefits for services provided during this visit. Patient/Guardian expressed understanding and agreed to proceed.  ? ?Hermine Messick,  LCSW ?07/03/2021 ? ?

## 2021-07-05 MED ORDER — ENTRESTO 97-103 MG PO TABS: 1.0000 | ORAL_TABLET | Freq: Two times a day (BID) | ORAL | 3 refills | Status: DC

## 2021-07-05 NOTE — Telephone Encounter (Signed)
Forms completed ? ?--need MD signature on rx.  Provider in office 3/8 ?

## 2021-07-07 MED ORDER — LOSARTAN POTASSIUM 50 MG PO TABS: 50.0000 mg | ORAL_TABLET | Freq: Every day | ORAL | 3 refills | Status: DC

## 2021-07-07 NOTE — Telephone Encounter (Addendum)
Forms completed and faxed ? ?Noticed losartan on medication list in addition to Entresto (Losartan recently refilled).  Called patient and patient states he was taking both but has been out of Entresto.   He is still currently taking losartan.   Advised to continue losartan until [facility] approved. Once Entresto approved and received-stop losartan and continue Entresto.   Dr. Bjorn Pippin aware.  ?

## 2021-07-12 NOTE — Telephone Encounter (Signed)
Returned call to patient, advised patient that we have not heard back yet from the foundation but that I would forward message to [facility], to follow up on status this week. Patient verbalized understanding.  ?

## 2021-07-12 NOTE — Telephone Encounter (Signed)
Patient is following up regarding Entresto application. He would like to know if we have any updates. Please advise. ?

## 2021-07-14 ENCOUNTER — Telehealth (HOSPITAL_COMMUNITY): Payer: No Payment, Other | Admitting: Physician Assistant

## 2021-07-14 NOTE — Telephone Encounter (Signed)
Called [employer] to check status of patient assistance-missing proof of income. ? ?Spoke to patient-patient will bring copy by to office.   ?

## 2021-07-14 NOTE — Telephone Encounter (Signed)
Patient dropped off paperwork-refaxed to [employer] ?

## 2021-07-15 ENCOUNTER — Telehealth: Payer: Self-pay | Admitting: Licensed Clinical Social Worker

## 2021-07-15 NOTE — Telephone Encounter (Signed)
LCSW called and was able to reach pt at [phone].  ?Pt has received the paperwork for [employer] and [employer]- he has been working on it and shares he will drop it by the [employer] office either today or tomorrow. I shared I will not be in clinic tomorrow but will assist with sending it in/updating any missing items. I let pt know that there will be changes in financial counseling department so I recommend he bring it to me instead at this time. Pt aware, will reach out to me if any additional questions/concerns.  ? ?[name], MSW, LCSW ?Clinical Social Worker II ?[facility]  ?[phone]- work cell phone (preferred) ?[phone]- desk phone ? ?

## 2021-07-19 ENCOUNTER — Telehealth: Payer: Self-pay | Admitting: Licensed Clinical Social Worker

## 2021-07-19 NOTE — Telephone Encounter (Signed)
Reviewed documents brought by patient to [facility] office due to changes in Financial Counseling staffing this week. LCSW able to submit applications for [employer]  and [employer]. I will follow for any account updates/missing documents needed. I remain available.  ? ?[name], MSW, LCSW ?Clinical Social Worker II ?[facility]  ?[phone]- work cell phone (preferred) ?[phone]- desk phone ? ?

## 2021-07-22 ENCOUNTER — Telehealth (HOSPITAL_COMMUNITY): Payer: No Payment, Other | Admitting: Physician Assistant

## 2021-07-23 NOTE — Telephone Encounter (Addendum)
Received call from patient-patient aware to call [employer] (# provided) to provide missing information.  Patient verbalized understanding.  ? ?Patient advised to call back if needed.   ?

## 2021-07-23 NOTE — Telephone Encounter (Signed)
Called [employer] to follow up on patient assistance-patient needs to send SS benefits letter or call himself to verbally disclose he does not receive SS ? ?Attempt to call patient to provide update-lmtcb ?

## 2021-07-25 ENCOUNTER — Emergency Department (HOSPITAL_BASED_OUTPATIENT_CLINIC_OR_DEPARTMENT_OTHER)
Admission: EM | Admit: 2021-07-25 | Discharge: 2021-07-25 | Disposition: A | Discharge status: Home / Self Care | Payer: Self-pay | Attending: Emergency Medicine | Admitting: Emergency Medicine

## 2021-07-25 ENCOUNTER — Encounter (HOSPITAL_BASED_OUTPATIENT_CLINIC_OR_DEPARTMENT_OTHER): Payer: Self-pay | Admitting: Emergency Medicine

## 2021-07-25 ENCOUNTER — Other Ambulatory Visit: Payer: Self-pay

## 2021-07-25 DIAGNOSIS — M62838 Other muscle spasm: Secondary | ICD-10-CM | POA: Diagnosis not present

## 2021-07-25 DIAGNOSIS — Y92410 Unspecified street and highway as the place of occurrence of the external cause: Secondary | ICD-10-CM | POA: Diagnosis not present

## 2021-07-25 DIAGNOSIS — M545 Low back pain, unspecified: Secondary | ICD-10-CM | POA: Diagnosis present

## 2021-07-25 DIAGNOSIS — Z7982 Long term (current) use of aspirin: Secondary | ICD-10-CM | POA: Insufficient documentation

## 2021-07-25 DIAGNOSIS — Z79899 Other long term (current) drug therapy: Secondary | ICD-10-CM | POA: Insufficient documentation

## 2021-07-25 MED ORDER — IBUPROFEN 800 MG PO TABS: 800.0000 mg | ORAL_TABLET | Freq: Two times a day (BID) | ORAL | 0 refills | Status: DC

## 2021-07-25 MED ORDER — IBUPROFEN 400 MG PO TABS
600.0000 mg | ORAL_TABLET | Freq: Once | ORAL | Status: AC
  Administered 2021-07-25: 600 mg via ORAL
  Filled 2021-07-25: qty 1

## 2021-07-25 MED ORDER — METHOCARBAMOL 500 MG PO TABS
500.0000 mg | ORAL_TABLET | Freq: Once | ORAL | Status: AC
  Administered 2021-07-25: 500 mg via ORAL
  Filled 2021-07-25: qty 1

## 2021-07-25 MED ORDER — METHOCARBAMOL 500 MG PO TABS: 500.0000 mg | ORAL_TABLET | Freq: Two times a day (BID) | ORAL | 0 refills | Status: DC
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
## 2021-07-25 NOTE — Discharge Instructions (Signed)
Rest, light stretching exercises, heat, Motrin, and muscle relaxers ?
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
## 2021-07-27 NOTE — Telephone Encounter (Signed)
?  Pt called back, he said he lost [employer] phone #. Got the number from [facility]. Provided [phone] ?

## 2021-07-28 ENCOUNTER — Telehealth (INDEPENDENT_AMBULATORY_CARE_PROVIDER_SITE_OTHER): Payer: No Payment, Other | Admitting: Physician Assistant

## 2021-07-28 ENCOUNTER — Encounter (HOSPITAL_COMMUNITY): Payer: Self-pay | Admitting: Physician Assistant

## 2021-07-28 DIAGNOSIS — G4700 Insomnia, unspecified: Secondary | ICD-10-CM

## 2021-07-28 DIAGNOSIS — F331 Major depressive disorder, recurrent, moderate: Secondary | ICD-10-CM | POA: Diagnosis not present

## 2021-07-28 DIAGNOSIS — F411 Generalized anxiety disorder: Secondary | ICD-10-CM

## 2021-07-28 MED ORDER — ESCITALOPRAM OXALATE 10 MG PO TABS: 10.0000 mg | ORAL_TABLET | Freq: Every day | ORAL | 1 refills | Status: DC

## 2021-07-28 MED ORDER — MELATONIN 10 MG PO TABS: 10.0000 mg | ORAL_TABLET | Freq: Every day | ORAL | 1 refills | Status: AC
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
## 2021-07-29 ENCOUNTER — Ambulatory Visit (HOSPITAL_COMMUNITY): Payer: No Payment, Other | Admitting: Licensed Clinical Social Worker

## 2021-07-29 DIAGNOSIS — F330 Major depressive disorder, recurrent, mild: Secondary | ICD-10-CM
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
## 2021-08-02 ENCOUNTER — Ambulatory Visit (INDEPENDENT_AMBULATORY_CARE_PROVIDER_SITE_OTHER): Payer: Self-pay | Admitting: Cardiology

## 2021-08-02 VITALS — BP 162/98 | HR 61 | Ht 72.0 in | Wt 291.0 lb

## 2021-08-02 DIAGNOSIS — I5042 Chronic combined systolic (congestive) and diastolic (congestive) heart failure: Secondary | ICD-10-CM

## 2021-08-02 DIAGNOSIS — I422 Other hypertrophic cardiomyopathy: Secondary | ICD-10-CM

## 2021-08-02 DIAGNOSIS — Z79899 Other long term (current) drug therapy: Secondary | ICD-10-CM

## 2021-08-02 DIAGNOSIS — I10 Essential (primary) hypertension: Secondary | ICD-10-CM

## 2021-08-02 MED ORDER — LOSARTAN POTASSIUM 50 MG PO TABS: 100.0000 mg | ORAL_TABLET | Freq: Every day | ORAL | 3 refills | Status: DC

## 2021-08-02 NOTE — Patient Instructions (Signed)
Medication Instructions:  ?INCREASE LOSARTAN TO 100mg  ONCE DAILY  ?*If you need a refill on your cardiac medications before your next appointment, please call your pharmacy* ? ?Lab Work: ?BMET- TODAY  ?If you have labs (blood work) drawn today and your tests are completely normal, you will receive your results only by: ?MyChart Message (if you have MyChart) OR ?A paper copy in the mail ?If you have any lab test that is abnormal or we need to change your treatment, we will call you to review the results. ? ?Follow-Up: ?At [facility], you and your health needs are our priority.  As part of our continuing mission to provide you with exceptional heart care, we have created designated Provider Care Teams.  These Care Teams include your primary Cardiologist (physician) and Advanced Practice Providers (APPs -  Physician Assistants and Nurse Practitioners) who all work together to provide you with the care you need, when you need it. ? ?We recommend signing up for the patient portal called "MyChart".  Sign up information is provided on this After Visit Summary.  MyChart is used to connect with patients for Virtual Visits (Telemedicine).  Patients are able to view lab/test results, encounter notes, upcoming appointments, etc.  Non-urgent messages can be sent to your provider as well.   ?To learn more about what you can do with MyChart, go to [email or web address].   ? ?Your next appointment:   ?3 month(s) WITH APP  ? ?And Then 6 MONTHS WITH DR. Gardiner Rhyme ? ?The format for your next appointment:   ?In Person ? ?Provider:   ?Donato Heinz, MD   ? ?Other Instructions ?WE RECOMMEND YOUR CHILDREN GET SCREENED FOR HYPERTROPHIC CARDIOMYOPATHY WITH AN ECHOCARDIOGRAM  ?

## 2021-08-02 NOTE — Progress Notes (Signed)
?Cardiology Office Note:   ? ?Date:  08/02/2021  ? ?ID:  Ronald Young, DOB 1970-09-26, MRN [id number] ? ?PCP:  Grayce Sessions, NP  ?Cardiologist:  Little Ishikawa, MD  ?Electrophysiologist:  None  ? ?Referring MD: Grayce Sessions, NP  ? ?Chief Complaint  ?Patient presents with  ? Follow-up  ?  3 months  ? ? ? ? ?History of Present Illness:   ? ?Ronald Young is a 51 y.o. male with a hx of nonischemic cardiomyopathy, mild bilateral carotid artery disease, hypertension, cocaine use who presents for follow-up.  He was admitted with decompensated heart failure and hypertensive urgency in August 2021, had been off his medications for years.  Echocardiogram 12/21/2019 showed LVEF less than 20% with global hypokinesis, moderate LVH, grade 3 diastolic dysfunction, severe RV dysfunction, mild MR, mild dilatation of the ascending aorta measuring 40 mm.  RHC/LHC on 12/23/2019 showed normal coronary arteries and filling pressures.  He was discharged on losartan 50 mg daily, carvedilol 25 mg twice daily, spironolactone 25 mg daily.  He was discharged on as needed Lasix, advised to monitor daily weights.    ? ?Repeat echocardiogram on 06/08/2020 showed significant improvement in LV systolic function (EF 45 to 50%), moderate LVH, grade 2 diastolic dysfunction, normal RV function, no significant valvular disease.  Cardiac MRI 05/10/21 showed severe asymmetric LVH measuring 55mm in basal septum (24mm in posterior wall) consistent with HCM, patchy LGE in basal septum and RV insertion site c/w HCM (LGE accounts for 13% of total myocardial mass), subendocardial LGE in apical inferior wall suggesting small infarct, LVEF 52%, RVEF 52%.  Zio x 3 days showed 1 episode of NSVT lasting 6 beats. ? ?Since last clinic visit, he reports that he has been doing well.  Reports occasional chest pain which he describes as aching pain on left side of chest.  Lasts for about an hour or 2.  Occurs at night.  Denies any exertional chest pain.   Denies any lightheadedness, syncope, dyspnea, or edema.  Reports rare palpitations. ? ?Wt Readings from Last 3 Encounters:  ?08/02/21 291 lb (132 kg)  ?07/25/21 280 lb (127 kg)  ?05/16/21 280 lb (127 kg)  ? ?BP Readings from Last 3 Encounters:  ?08/02/21 (!) 162/98  ?07/25/21 131/89  ?04/06/21 98/64  ? ?Past Medical History:  ?Diagnosis Date  ? Arthritis   ? Cardiomyopathy (HCC) 09/22/2014  ? Hypertension   ? ? ?Past Surgical History:  ?Procedure Laterality Date  ? RIGHT/LEFT HEART CATH AND CORONARY ANGIOGRAPHY N/A 12/23/2019  ? Procedure: RIGHT/LEFT HEART CATH AND CORONARY ANGIOGRAPHY;  Surgeon: Runell Gess, MD;  Location: [facility];  Service: Cardiovascular;  Laterality: N/A;  ? ?Current Medications: ?Current Meds  ?Medication Sig  ? aspirin 81 MG chewable tablet Chew 1 tablet (81 mg total) by mouth daily.  ? carvedilol (COREG) 25 MG tablet Take 1.5 tablets (37.5 mg total) by mouth 2 (two) times daily with a meal.  ? cetirizine (ZYRTEC) 10 MG tablet Take 1 tablet (10 mg total) by mouth daily.  ? dapagliflozin propanediol (FARXIGA) 10 MG TABS tablet Take 10 mg by mouth daily.  ? diclofenac Sodium (VOLTAREN) 1 % GEL Apply 2 g topically 4 (four) times daily.  ? escitalopram (LEXAPRO) 10 MG tablet Take 1 tablet (10 mg total) by mouth daily.  ? fluticasone (FLONASE) 50 MCG/ACT nasal spray Place 2 sprays into both nostrils daily.  ? hydrALAZINE (APRESOLINE) 50 MG tablet Take 2 tablets (100 mg total)  by mouth 3 (three) times daily.  ? ibuprofen (ADVIL) 800 MG tablet Take 1 tablet (800 mg total) by mouth 2 (two) times daily.  ? isosorbide mononitrate (IMDUR) 30 MG 24 hr tablet Take 1 tablet (30 mg total) by mouth daily.  ? Melatonin 10 MG TABS Take 10 mg by mouth at bedtime.  ? methocarbamol (ROBAXIN) 500 MG tablet Take 1 tablet (500 mg total) by mouth 2 (two) times daily.  ? Multiple Vitamins-Minerals (MULTIVITAMIN WITH MINERALS) tablet Take 1 tablet by mouth daily.  ? rosuvastatin (CRESTOR) 10 MG tablet  Take 1 tablet (10 mg total) by mouth daily.  ? sacubitril-valsartan (ENTRESTO) 97-103 MG Take 1 tablet by mouth 2 (two) times daily.  ? spironolactone (ALDACTONE) 25 MG tablet Take 1 tablet (25 mg total) by mouth daily.  ? [DISCONTINUED] losartan (COZAAR) 50 MG tablet Take 1 tablet (50 mg total) by mouth daily. STOP ONCE ENTRESTO STARTED  ?  ? ?Allergies:   Strawberry extract  ? ?Social History  ? ?Socioeconomic History  ? Marital status: Single  ?  Spouse name: Not on file  ? Number of children: Not on file  ? Years of education: Not on file  ? Highest education level: Not on file  ?Occupational History  ? Not on file  ?Tobacco Use  ? Smoking status: Former  ?  Types: Cigarettes  ? Smokeless tobacco: Never  ?Vaping Use  ? Vaping Use: Never used  ?Substance and Sexual Activity  ? Alcohol use: Not Currently  ? Drug use: Yes  ?  Types: Marijuana  ? Sexual activity: Not on file  ?Other Topics Concern  ? Not on file  ?Social History Narrative  ? Not on file  ? ?Social Determinants of Health  ? ?Financial Resource Strain: Not on file  ?Food Insecurity: No Food Insecurity  ? Worried About Programme researcher, broadcasting/film/video in the Last Year: Never true  ? Ran Out of Food in the Last Year: Never true  ?Transportation Needs: Not on file  ?Physical Activity: Not on file  ?Stress: Not on file  ?Social Connections: Not on file  ?  ? ?Family History: ?[name] family history includes Colon cancer in an other family member; Hypertension in his mother. ? ?ROS:   ?Please see the history of present illness.    ? (+) palpitations  ?(+)Shortness of breath  ?(+) headaches  ?(+) Sinus problems ?All other systems reviewed and are negative. ? ?EKGs/Labs/Other Studies Reviewed:   ? ?The following studies were reviewed today: ?ECHO 06/08/2020:  ? ?IMPRESSIONS  ? ? 1. Left ventricular ejection fraction, by estimation, is 45 to 50%. The  ?left ventricle has mildly decreased function. The left ventricle  ?demonstrates global hypokinesis. There is  moderate concentric left  ?ventricular hypertrophy. Left ventricular  ?diastolic parameters are consistent with Grade II diastolic dysfunction  ?(pseudonormalization). Elevated left atrial pressure.  ? 2. Right ventricular systolic function is normal. The right ventricular  ?size is normal. Tricuspid regurgitation signal is inadequate for assessing  ?PA pressure.  ? 3. Left atrial size was mild to moderately dilated.  ? 4. The mitral valve is grossly normal. Trivial mitral valve  ?regurgitation. No evidence of mitral stenosis.  ? 5. The aortic valve is tricuspid. Aortic valve regurgitation is not  ?visualized. No aortic stenosis is present.  ? 6. The inferior vena cava is normal in size with greater than 50%  ?respiratory variability, suggesting right atrial pressure of 3 mmHg.  ? ?R/L HEART CATH  12/23/2019 ?IMPRESSION: Mr. Vandenbrink has normal coronary arteries, elevated LVEDP and fairly normal filling pressures. He has a nonischemic cardiomyopathy probably hypertensive in nature from failure to take his prescribed medications. The antecubital venous sheath was removed and pressure held. The radial sheath was removed and a TR band was placed on the right wrist to achieve patent hemostasis. The patient left lab in stable condition. He'll be transferred back to [facility] once the TR band is off and resume guideline directed optimal medical treatment. He left the lab in stable condition. ? ?ECHO 12/21/19 ? ?IMPRESSIONS  ? ? 1. Left ventricular ejection fraction, by estimation, is <20%. The left  ?ventricle has severely decreased function. The left ventricle demonstrates  ?global hypokinesis. The left ventricular internal cavity size was  ?moderately dilated. There is moderate  ?concentric left ventricular hypertrophy. Left ventricular diastolic  ?parameters are consistent with Grade III diastolic dysfunction  ?(restrictive).  ? 2. Right ventricular systolic function is severely reduced. The right  ?ventricular  size is moderately enlarged.  ? 3. Left atrial size was severely dilated.  ? 4. Right atrial size was mildly dilated.  ? 5. The mitral valve is normal in structure. Mild mitral valve  ?regurgitation. No evid

## 2021-08-03 ENCOUNTER — Other Ambulatory Visit: Payer: Self-pay | Admitting: *Deleted

## 2021-08-03 DIAGNOSIS — Z79899 Other long term (current) drug therapy: Secondary | ICD-10-CM

## 2021-08-03 DIAGNOSIS — I5042 Chronic combined systolic (congestive) and diastolic (congestive) heart failure: Secondary | ICD-10-CM

## 2021-08-03 LAB — BASIC METABOLIC PANEL
BUN/Creatinine Ratio: 14 (ref 9–20)
BUN: 16 mg/dL (ref 6–24)
CO2: 23 mmol/L (ref 20–29)
Calcium: 9.8 mg/dL (ref 8.7–10.2)
Chloride: 105 mmol/L (ref 96–106)
Creatinine, Ser: 1.14 mg/dL (ref 0.76–1.27)
Glucose: 96 mg/dL (ref 70–99)
Potassium: 4.2 mmol/L (ref 3.5–5.2)
Sodium: 140 mmol/L (ref 134–144)
eGFR: 79 mL/min/{1.73_m2} (ref >59)

## 2021-08-16 ENCOUNTER — Other Ambulatory Visit: Payer: Self-pay | Admitting: Family Medicine

## 2021-08-16 DIAGNOSIS — I10 Essential (primary) hypertension: Secondary | ICD-10-CM

## 2021-08-16 NOTE — Telephone Encounter (Signed)
Medication Refill - Medication:  ?hydrALAZINE (APRESOLINE) 50 MG tablet  ?spironolactone (ALDACTONE) 25 MG tablet  ?isosorbide mononitrate (IMDUR) 30 MG 24 hr tablet  ?carvedilol (COREG) 25 MG tablet ? ?Has the patient contacted their pharmacy? Yes ?Contact PCP ? ?Preferred Pharmacy (with phone number or street name):  ? ? ? ?Has the patient been seen for an appointment in the last year OR does the patient have an upcoming appointment? Yes.   ? ?Agent: Please be advised that RX refills may take up to 3 business days. We ask that you follow-up with your pharmacy. ?

## 2021-08-17 ENCOUNTER — Other Ambulatory Visit: Payer: Self-pay

## 2021-08-17 NOTE — Telephone Encounter (Signed)
?Notes to clinic:  Not a prescriber in this practice. ? ? ?  ? ?Requested Prescriptions  ?Pending Prescriptions Disp Refills  ? spironolactone (ALDACTONE) 25 MG tablet 90 tablet 3  ?  Sig: Take 1 tablet (25 mg total) by mouth daily.  ?  ? Cardiovascular: Diuretics - Aldosterone Antagonist Failed - 08/16/2021  1:48 PM  ?  ?  Failed - Last BP in normal range  ?  BP Readings from Last 1 Encounters:  ?08/02/21 (!) 162/98  ?  ?  ?  ?  Passed - Cr in normal range and within 180 days  ?  Creat  ?Date Value Ref Range Status  ?10/30/2014 1.24 0.50 - 1.35 mg/dL Final  ? ?Creatinine, Ser  ?Date Value Ref Range Status  ?08/02/2021 1.14 0.76 - 1.27 mg/dL Final  ? ?Creatinine, Urine  ?Date Value Ref Range Status  ?09/22/2014 171.79 mg/dL Final  ?  ?  ?  ?  Passed - K in normal range and within 180 days  ?  Potassium  ?Date Value Ref Range Status  ?08/02/2021 4.2 3.5 - 5.2 mmol/L Final  ?  ?  ?  ?  Passed - Na in normal range and within 180 days  ?  Sodium  ?Date Value Ref Range Status  ?08/02/2021 140 134 - 144 mmol/L Final  ?  ?  ?  ?  Passed - eGFR is 30 or above and within 180 days  ?  GFR calc Af Amer  ?Date Value Ref Range Status  ?06/19/2020 81 >59 mL/min/1.73 Final  ?  Comment:  ?  **In accordance with recommendations from the NKF-ASN Task force,** ?  [facility] is in the process of updating its eGFR calculation to the ?  2021 CKD-EPI creatinine equation that estimates kidney function ?  without a race variable. ?  ? ?GFR calc non Af Amer  ?Date Value Ref Range Status  ?06/19/2020 70 >59 mL/min/1.73 Final  ? ?eGFR  ?Date Value Ref Range Status  ?08/02/2021 79 >59 mL/min/1.73 Final  ?  ?  ?  ?  Passed - Valid encounter within last 6 months  ?  Recent Outpatient Visits   ? ?      ? 4 months ago Hypertensive heart disease with chronic systolic congestive heart failure ([place])  ? [place], Charlane Ferretti, MD  ? 7 months ago Screening for diabetes mellitus  ? [facility]  Kerin Perna, NP  ? 1 year ago Hypertensive heart disease with chronic systolic congestive heart failure ([place])  ? [place], Charlane Ferretti, MD  ? 1 year ago Acute on chronic combined systolic and diastolic CHF (congestive heart failure) ([place])  ? [place], MD  ? 6 years ago Cardiomyopathy due to hypertension, without heart failure  ? [place] Wall, Marijo Conception, MD  ? ?  ?  ?Future Appointments   ? ?        ? In 1 week Thereasa Solo [facility]imer [facility] [place]  ? In 2 months Donato Heinz, MD [facility]  ? ?  ? ? ?  ?  ?  ? hydrALAZINE (APRESOLINE) 50 MG tablet 540 tablet 3  ?  Sig: Take 2 tablets (100 mg total) by mouth 3 (three) times daily.  ?  ? Cardiovascular:  Vasodilators Failed - 08/16/2021  1:48  PM  ?  ?  Failed - ANA Screen, Ifa, Serum in normal range and within 360 days  ?  No results found for: ANA, ANATITER, LABANTI  ?  ?  ?  Failed - Last BP in normal range  ?  BP Readings from Last 1 Encounters:  ?08/02/21 (!) 162/98  ?  ?  ?  ?  Passed - HCT in normal range and within 360 days  ?  Hematocrit  ?Date Value Ref Range Status  ?05/06/2021 40.8 37.5 - 51.0 % Final  ?  ?  ?  ?  Passed - HGB in normal range and within 360 days  ?  Hemoglobin  ?Date Value Ref Range Status  ?05/06/2021 13.8 13.0 - 17.7 g/dL Final  ?  ?  ?  ?  Passed - RBC in normal range and within 360 days  ?  RBC  ?Date Value Ref Range Status  ?05/06/2021 4.74 4.14 - 5.80 x10E6/uL Final  ?12/24/2019 3.93 (L) 4.22 - 5.81 MIL/uL Final  ?  ?  ?  ?  Passed - WBC in normal range and within 360 days  ?  WBC  ?Date Value Ref Range Status  ?05/06/2021 7.5 3.4 - 10.8 x10E3/uL Final  ?12/24/2019 7.6 4.0 - 10.5 K/uL Final  ?  ?  ?  ?  Passed - PLT in normal range and within 360 days  ?  Platelets  ?Date Value Ref Range Status  ?05/06/2021 320 150 - 450 x10E3/uL Final  ?  ?  ?  ?   Passed - Valid encounter within last 12 months  ?  Recent Outpatient Visits   ? ?      ? 4 months ago Hypertensive heart disease with chronic systolic congestive heart failure ([place])  ? [place], Charlane Ferretti, MD  ? 7 months ago Screening for diabetes mellitus  ? [facility] Kerin Perna, NP  ? 1 year ago Hypertensive heart disease with chronic systolic congestive heart failure ([place])  ? [place], Charlane Ferretti, MD  ? 1 year ago Acute on chronic combined systolic and diastolic CHF (congestive heart failure) ([place])  ? [place], MD  ? 6 years ago Cardiomyopathy due to hypertension, without heart failure  ? [place] Wall, Marijo Conception, MD  ? ?  ?  ?Future Appointments   ? ?        ? In 1 week Thereasa Solo Casimer [facility] [place]  ? In 2 months Donato Heinz, MD [facility]  ? ?  ? ? ?  ?  ?  ? isosorbide mononitrate (IMDUR) 30 MG 24 hr tablet 90 tablet 3  ?  Sig: Take 1 tablet (30 mg total) by mouth daily.  ?  ? Cardiovascular:  Nitrates Failed - 08/16/2021  1:48 PM  ?  ?  Failed - Last BP in normal range  ?  BP Readings from Last 1 Encounters:  ?08/02/21 (!) 162/98  ?  ?  ?  ?  Passed - Last Heart Rate in normal range  ?  Pulse Readings from Last 1 Encounters:  ?08/02/21 61  ?  ?  ?  ?  Passed - Valid encounter within last 12 months  ?  Recent Outpatient Visits   ? ?      ? 4 months ago  Hypertensive heart disease with chronic systolic congestive heart failure ([place])  ? [place], Charlane Ferretti, MD  ? 7 months ago Screening for diabetes mellitus  ? [facility] Kerin Perna, NP  ? 1 year ago Hypertensive heart disease with chronic systolic congestive heart failure ([place])  ? [place], Charlane Ferretti,  MD  ? 1 year ago Acute on chronic combined systolic and diastolic CHF (congestive heart failure) ([place])  ? [place], MD  ? 6 years ago Cardiomyopathy due to hypertension, without heart failure  ? [place], Marijo Conception, MD  ? ?  ?  ?Future Appointments   ? ?        ? In 1 week Thereasa Solo Casimer [facility] [place]  ? In 2 months Donato Heinz, MD [facility]  ? ?  ? ? ?  ?  ?  ? carvedilol (COREG) 25 MG tablet 270 tablet 3  ?  Sig: Take 1.5 tablets (37.5 mg total) by mouth 2 (two) times daily with a meal.  ?  ? Cardiovascular: Beta Blockers 3 Failed - 08/16/2021  1:48 PM  ?  ?  Failed - AST in normal range and within 360 days  ?  AST  ?Date Value Ref Range Status  ?12/20/2019 31 15 - 41 U/L Final  ?  ?  ?  ?  Failed - ALT in normal range and within 360 days  ?  ALT  ?Date Value Ref Range Status  ?12/20/2019 43 0 - 44 U/L Final  ?  ?  ?  ?  Failed - Last BP in normal range  ?  BP Readings from Last 1 Encounters:  ?08/02/21 (!) 162/98  ?  ?  ?  ?  Passed - Cr in normal range and within 360 days  ?  Creat  ?Date Value Ref Range Status  ?10/30/2014 1.24 0.50 - 1.35 mg/dL Final  ? ?Creatinine, Ser  ?Date Value Ref Range Status  ?08/02/2021 1.14 0.76 - 1.27 mg/dL Final  ? ?Creatinine, Urine  ?Date Value Ref Range Status  ?09/22/2014 171.79 mg/dL Final  ?  ?  ?  ?  Passed - Last Heart Rate in normal range  ?  Pulse Readings from Last 1 Encounters:  ?08/02/21 61  ?  ?  ?  ?  Passed - Valid encounter within last 6 months  ?  Recent Outpatient Visits   ? ?      ? 4 months ago Hypertensive heart disease with chronic systolic congestive heart failure ([place])  ? [place], Charlane Ferretti, MD  ? 7 months ago Screening for diabetes mellitus  ? [facility] Kerin Perna, NP  ? 1 year ago Hypertensive heart disease with chronic systolic  congestive heart failure ([place])  ? [place], Charlane Ferretti, MD  ? 1 year ago Acute on chronic combined systolic and diastolic CHF (congestive heart failure) ([place])  ? [facility]

## 2021-08-18 ENCOUNTER — Other Ambulatory Visit: Payer: Self-pay

## 2021-08-18 ENCOUNTER — Ambulatory Visit (INDEPENDENT_AMBULATORY_CARE_PROVIDER_SITE_OTHER): Payer: No Payment, Other | Admitting: Licensed Clinical Social Worker

## 2021-08-18 DIAGNOSIS — F4323 Adjustment disorder with mixed anxiety and depressed mood: Secondary | ICD-10-CM | POA: Diagnosis not present

## 2021-08-18 MED ORDER — ISOSORBIDE MONONITRATE ER 30 MG PO TB24
30.0000 mg | ORAL_TABLET | Freq: Every day | ORAL | 3 refills | Status: DC
Start: 2021-08-18 — End: 2021-11-11
  Filled 2021-08-18: qty 30, 30d supply, fill #0
  Filled 2021-09-23: qty 30, 30d supply, fill #1

## 2021-08-18 MED ORDER — CARVEDILOL 25 MG PO TABS
37.5000 mg | ORAL_TABLET | Freq: Two times a day (BID) | ORAL | 3 refills | Status: DC
Start: 2021-08-18 — End: 2021-11-11
  Filled 2021-08-18: qty 90, 30d supply, fill #0

## 2021-08-18 MED ORDER — HYDRALAZINE HCL 50 MG PO TABS
100.0000 mg | ORAL_TABLET | Freq: Three times a day (TID) | ORAL | 3 refills | Status: DC
  Filled 2021-08-18: qty 180, 30d supply, fill #0

## 2021-08-18 MED ORDER — SPIRONOLACTONE 25 MG PO TABS
25.0000 mg | ORAL_TABLET | Freq: Every day | ORAL | 3 refills | Status: DC
  Filled 2021-08-18: qty 30, 30d supply, fill #0
  Filled 2021-09-23: qty 30, 30d supply, fill #1
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
## 2021-08-19 ENCOUNTER — Other Ambulatory Visit: Payer: Self-pay

## 2021-08-20 ENCOUNTER — Telehealth: Payer: Self-pay | Admitting: Licensed Clinical Social Worker

## 2021-08-20 NOTE — Telephone Encounter (Signed)
PATIENT APPROVED FOR 100% FINANCIAL ASSISTANCE PER CAFA APPLICATION AND SUPPORTING DOCUMENTATION.  ?CAFA APP SCANNED TO ACCOUNT [id number] ?APPROVAL DATES OF [employer] ---- 07/15/21 - 01/15/22  ?APPROVAL LETTER ON ACCOUNT [id number] ? ?LCSW contacted pt via telephone today at [phone] to let him know the above. He has been contacted also by [employer] but is okay with me f/u with their team to confirm pt approved for ongoing coverage through their team.  ? ?I have mailed approval letter and original documents back to pt.  ?Pt will need to reapply on or after 01/15/22 for ongoing consideration for CAFA.  ? ?My card was included again and pt was encouraged to f/u with me if any additional questions/concerns. ? ?[name], MSW, LCSW ?Clinical Social Worker II ?[facility]  ?[phone]- work cell phone (preferred) ?[phone]- desk phone ? ?

## 2021-08-23 ENCOUNTER — Telehealth: Payer: Self-pay | Admitting: Licensed Clinical Social Worker

## 2021-08-23 NOTE — Telephone Encounter (Signed)
Confirmed via email that pt has been approved for [facility] 3/30-9/30. Made note to f/u with pt closer to that time for renewal as needed.  ? ?[name], MSW, LCSW ?Clinical Social Worker II ?[facility]  ?[phone]- work cell phone (preferred) ?[phone]- desk phone ? ?

## 2021-08-25 ENCOUNTER — Telehealth: Payer: Self-pay | Admitting: Licensed Clinical Social Worker
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
## 2021-08-25 NOTE — Telephone Encounter (Signed)
Pt called asking for refill of Entresto, I reviewed chart and noted that he had never received a determination from [employer] for PAP. Pt had been instructed to call [employer] since he has no current income (confirmed with Mayme Genta, RN). Pt was confused by approval for [employer] and thought that was also his Entresto. Provided pt the number again for [employer] and encouraged him to f/u with [place] if still having challenges. Will route this to RN as an Financial planner. ? ?[name], MSW, LCSW ?Clinical Social Worker II ?[facility]  ?[phone]- work cell phone (preferred) ?[phone]- desk phone ? ?

## 2021-08-26 ENCOUNTER — Inpatient Hospital Stay: Payer: Self-pay | Admitting: Physician Assistant

## 2021-09-01 ENCOUNTER — Inpatient Hospital Stay: Payer: Self-pay | Admitting: Physician Assistant
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
## 2021-09-09 ENCOUNTER — Telehealth (INDEPENDENT_AMBULATORY_CARE_PROVIDER_SITE_OTHER): Payer: No Payment, Other | Admitting: Physician Assistant

## 2021-09-09 DIAGNOSIS — F331 Major depressive disorder, recurrent, moderate: Secondary | ICD-10-CM | POA: Diagnosis not present

## 2021-09-09 DIAGNOSIS — F411 Generalized anxiety disorder: Secondary | ICD-10-CM

## 2021-09-09 DIAGNOSIS — G4700 Insomnia, unspecified: Secondary | ICD-10-CM | POA: Diagnosis not present

## 2021-09-09 MED ORDER — TRAZODONE HCL 50 MG PO TABS: 50.0000 mg | ORAL_TABLET | Freq: Every day | ORAL | 1 refills | Status: DC

## 2021-09-09 MED ORDER — ESCITALOPRAM OXALATE 20 MG PO TABS: 20.0000 mg | ORAL_TABLET | Freq: Every day | ORAL | 1 refills | Status: DC

## 2021-09-12 ENCOUNTER — Encounter (HOSPITAL_COMMUNITY): Payer: Self-pay | Admitting: Physician Assistant
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
## 2021-09-13 ENCOUNTER — Ambulatory Visit (HOSPITAL_COMMUNITY): Payer: No Payment, Other | Admitting: Licensed Clinical Social Worker

## 2021-09-15 ENCOUNTER — Ambulatory Visit (HOSPITAL_COMMUNITY): Payer: No Payment, Other | Admitting: Licensed Clinical Social Worker

## 2021-09-20 ENCOUNTER — Other Ambulatory Visit: Payer: Self-pay

## 2021-09-20 DIAGNOSIS — Z79899 Other long term (current) drug therapy: Secondary | ICD-10-CM

## 2021-09-20 DIAGNOSIS — I5042 Chronic combined systolic (congestive) and diastolic (congestive) heart failure: Secondary | ICD-10-CM

## 2021-09-21 LAB — BASIC METABOLIC PANEL
BUN/Creatinine Ratio: 16 (ref 9–20)
BUN: 19 mg/dL (ref 6–24)
CO2: 20 mmol/L (ref 20–29)
Calcium: 9.2 mg/dL (ref 8.7–10.2)
Chloride: 106 mmol/L (ref 96–106)
Creatinine, Ser: 1.22 mg/dL (ref 0.76–1.27)
Glucose: 96 mg/dL (ref 70–99)
Potassium: 4.4 mmol/L (ref 3.5–5.2)
Sodium: 141 mmol/L (ref 134–144)
eGFR: 73 mL/min/{1.73_m2} (ref >59)

## 2021-09-23 ENCOUNTER — Other Ambulatory Visit: Payer: Self-pay

## 2021-09-23 ENCOUNTER — Ambulatory Visit: Payer: No Typology Code available for payment source | Attending: Physician Assistant | Admitting: Physician Assistant

## 2021-09-23 ENCOUNTER — Encounter: Payer: Self-pay | Admitting: Physician Assistant

## 2021-09-23 VITALS — BP 148/97 | HR 61 | Wt 298.4 lb

## 2021-09-23 DIAGNOSIS — I110 Hypertensive heart disease with heart failure: Secondary | ICD-10-CM

## 2021-09-23 DIAGNOSIS — S39012D Strain of muscle, fascia and tendon of lower back, subsequent encounter: Secondary | ICD-10-CM

## 2021-09-23 DIAGNOSIS — F331 Major depressive disorder, recurrent, moderate: Secondary | ICD-10-CM

## 2021-09-23 DIAGNOSIS — Z09 Encounter for follow-up examination after completed treatment for conditions other than malignant neoplasm: Secondary | ICD-10-CM

## 2021-09-23 DIAGNOSIS — T485X5A Adverse effect of other anti-common-cold drugs, initial encounter: Secondary | ICD-10-CM

## 2021-09-23 DIAGNOSIS — J328 Other chronic sinusitis: Secondary | ICD-10-CM

## 2021-09-23 DIAGNOSIS — I5022 Chronic systolic (congestive) heart failure: Secondary | ICD-10-CM

## 2021-09-23 DIAGNOSIS — S29019D Strain of muscle and tendon of unspecified wall of thorax, subsequent encounter: Secondary | ICD-10-CM

## 2021-09-23 DIAGNOSIS — G4700 Insomnia, unspecified: Secondary | ICD-10-CM

## 2021-09-23 DIAGNOSIS — J310 Chronic rhinitis: Secondary | ICD-10-CM

## 2021-09-23 DIAGNOSIS — F411 Generalized anxiety disorder: Secondary | ICD-10-CM

## 2021-09-23 MED ORDER — DAPAGLIFLOZIN PROPANEDIOL 10 MG PO TABS
10.0000 mg | ORAL_TABLET | Freq: Every day | ORAL | 4 refills | Status: DC
  Filled 2021-09-23 – 2021-09-30 (×2): qty 30, 30d supply, fill #0

## 2021-09-23 MED ORDER — ESCITALOPRAM OXALATE 20 MG PO TABS
20.0000 mg | ORAL_TABLET | Freq: Every day | ORAL | 1 refills | Status: DC
  Filled 2021-09-23 – 2021-09-30 (×2): qty 30, 30d supply, fill #0

## 2021-09-23 MED ORDER — MELOXICAM 15 MG PO TABS
15.0000 mg | ORAL_TABLET | Freq: Every day | ORAL | 0 refills | Status: DC
  Filled 2021-09-23 – 2021-09-30 (×2): qty 30, 30d supply, fill #0

## 2021-09-23 MED ORDER — TRAZODONE HCL 50 MG PO TABS
50.0000 mg | ORAL_TABLET | Freq: Every day | ORAL | 1 refills | Status: AC
  Filled 2021-09-23 – 2021-09-30 (×2): qty 30, 30d supply, fill #0

## 2021-09-23 MED ORDER — FLUTICASONE PROPIONATE 50 MCG/ACT NA SUSP
2.0000 | Freq: Every day | NASAL | 1 refills | Status: DC
  Filled 2021-09-23: qty 16, 50d supply, fill #0
  Filled 2021-09-30: qty 16, 30d supply, fill #0

## 2021-09-23 MED ORDER — METHOCARBAMOL 500 MG PO TABS
1000.0000 mg | ORAL_TABLET | Freq: Three times a day (TID) | ORAL | 0 refills | Status: DC | PRN
  Filled 2021-09-23 – 2021-09-30 (×2): qty 90, 15d supply, fill #0

## 2021-09-23 MED ORDER — ROSUVASTATIN CALCIUM 10 MG PO TABS
10.0000 mg | ORAL_TABLET | Freq: Every day | ORAL | 3 refills | Status: DC
  Filled 2021-09-23: qty 90, 90d supply, fill #0
  Filled 2021-09-30: qty 30, 30d supply, fill #0
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
## 2021-09-24 ENCOUNTER — Encounter: Payer: Self-pay | Admitting: *Deleted

## 2021-09-29 ENCOUNTER — Ambulatory Visit (HOSPITAL_COMMUNITY): Payer: No Payment, Other | Admitting: Licensed Clinical Social Worker

## 2021-09-30 ENCOUNTER — Other Ambulatory Visit: Payer: Self-pay

## 2021-10-01 ENCOUNTER — Other Ambulatory Visit: Payer: Self-pay

## 2021-10-04 ENCOUNTER — Ambulatory Visit (HOSPITAL_COMMUNITY): Payer: No Payment, Other | Admitting: Licensed Clinical Social Worker

## 2021-10-07 ENCOUNTER — Ambulatory Visit: Payer: Self-pay | Admitting: Surgery

## 2021-10-13 ENCOUNTER — Encounter (HOSPITAL_COMMUNITY): Payer: Self-pay

## 2021-10-13 ENCOUNTER — Ambulatory Visit (INDEPENDENT_AMBULATORY_CARE_PROVIDER_SITE_OTHER): Payer: No Payment, Other | Admitting: Licensed Clinical Social Worker

## 2021-10-13 DIAGNOSIS — F4323 Adjustment disorder with mixed anxiety and depressed mood: Secondary | ICD-10-CM
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
## 2021-10-27 ENCOUNTER — Ambulatory Visit (INDEPENDENT_AMBULATORY_CARE_PROVIDER_SITE_OTHER): Payer: No Payment, Other | Admitting: Licensed Clinical Social Worker

## 2021-10-27 DIAGNOSIS — F4323 Adjustment disorder with mixed anxiety and depressed mood: Secondary | ICD-10-CM
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
## 2021-10-28 ENCOUNTER — Encounter: Payer: Self-pay | Admitting: Surgery

## 2021-10-28 ENCOUNTER — Ambulatory Visit (INDEPENDENT_AMBULATORY_CARE_PROVIDER_SITE_OTHER): Payer: Self-pay | Admitting: Surgery

## 2021-10-28 ENCOUNTER — Ambulatory Visit: Payer: Self-pay

## 2021-10-28 VITALS — BP 179/130

## 2021-10-28 DIAGNOSIS — M545 Low back pain, unspecified: Secondary | ICD-10-CM
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
## 2021-11-04 ENCOUNTER — Telehealth: Payer: Self-pay | Admitting: Cardiology

## 2021-11-04 DIAGNOSIS — I110 Hypertensive heart disease with heart failure: Secondary | ICD-10-CM

## 2021-11-04 MED ORDER — DAPAGLIFLOZIN PROPANEDIOL 10 MG PO TABS: 10.0000 mg | ORAL_TABLET | Freq: Every day | ORAL | 4 refills | Status: DC

## 2021-11-04 MED ORDER — SPIRONOLACTONE 25 MG PO TABS: 25.0000 mg | ORAL_TABLET | Freq: Every day | ORAL | 3 refills | Status: DC

## 2021-11-04 NOTE — Telephone Encounter (Signed)
*  STAT* If patient is at the pharmacy, call can be transferred to refill team.   1. Which medications need to be refilled? (please list name of each medication and dose if known)  spironolactone (ALDACTONE) 25 MG tablet dapagliflozin propanediol (FARXIGA) 10 MG TABS tablet  2. Which pharmacy/location (including street and city if local pharmacy) is medication to be sent to? [facility] - [place] ([place]), [place] - [address]  3. Do they need a 30 day or 90 day supply? 90 day   Patient is completely out of medications.

## 2021-11-07 NOTE — Progress Notes (Signed)
Cardiology Office Note:    Date:  11/12/2021   ID:  Ronald Young, DOB 1971/03/31, MRN [id number]  PCP:  Hoy Register, MD  Cardiologist:  Little Ishikawa, MD  Electrophysiologist:  None   Referring MD: Grayce Sessions, NP   Chief Complaint  Patient presents with   Congestive Heart Failure      History of Present Illness:    Ronald Young is a 51 y.o. male with a hx of nonischemic cardiomyopathy, mild bilateral carotid artery disease, hypertension, cocaine use who presents for follow-up.  He was admitted with decompensated heart failure and hypertensive urgency in August 2021, had been off his medications for years.  Echocardiogram 12/21/2019 showed LVEF less than 20% with global hypokinesis, moderate LVH, grade 3 diastolic dysfunction, severe RV dysfunction, mild MR, mild dilatation of the ascending aorta measuring 40 mm.  RHC/LHC on 12/23/2019 showed normal coronary arteries and filling pressures.  He was discharged on losartan 50 mg daily, carvedilol 25 mg twice daily, spironolactone 25 mg daily.  He was discharged on as needed Lasix, advised to monitor daily weights.     Repeat echocardiogram on 06/08/2020 showed significant improvement in LV systolic function (EF 45 to 50%), moderate LVH, grade 2 diastolic dysfunction, normal RV function, no significant valvular disease.  Cardiac MRI 05/10/21 showed severe asymmetric LVH measuring 43mm in basal septum (65mm in posterior wall) consistent with HCM, patchy LGE in basal septum and RV insertion site c/w HCM (LGE accounts for 13% of total myocardial mass), subendocardial LGE in apical inferior wall suggesting small infarct, LVEF 52%, RVEF 52%.  Zio x 3 days showed 1 episode of NSVT lasting 6 beats.  Since last clinic visit, he reports he is doing okay.  Does report he is been having some shortness of breath this week.  Reports his walking around his house causes him to be short of breath.  Denies any chest pain, dyspnea,  lower  extremity edema.  Does report occasional lightheadedness but denies any syncope.  Reports rare palpitations of short duration.  He has been off [place] x3 weeks.  Wt Readings from Last 3 Encounters:  11/11/21 293 lb 12.8 oz (133.3 kg)  09/23/21 298 lb 6.4 oz (135.4 kg)  08/02/21 291 lb (132 kg)   BP Readings from Last 3 Encounters:  11/11/21 (!) 146/98  10/28/21 (!) 179/130  09/23/21 (!) 148/97   Past Medical History:  Diagnosis Date   Arthritis    Cardiomyopathy (HCC) 09/22/2014   Hypertension     Past Surgical History:  Procedure Laterality Date   RIGHT/LEFT HEART CATH AND CORONARY ANGIOGRAPHY N/A 12/23/2019   Procedure: RIGHT/LEFT HEART CATH AND CORONARY ANGIOGRAPHY;  Surgeon: Runell Gess, MD;  Location: [facility];  Service: Cardiovascular;  Laterality: N/A;   Current Medications: Current Meds  Medication Sig   aspirin 81 MG chewable tablet Chew 1 tablet (81 mg total) by mouth daily.   cetirizine (ZYRTEC) 10 MG tablet Take 1 tablet (10 mg total) by mouth daily.   dapagliflozin propanediol (FARXIGA) 10 MG TABS tablet Take 1 tablet (10 mg total) by mouth daily.   diclofenac Sodium (VOLTAREN) 1 % GEL Apply 2 g topically 4 (four) times daily.   escitalopram (LEXAPRO) 20 MG tablet Take 1 tablet (20 mg total) by mouth daily.   fluticasone (FLONASE) 50 MCG/ACT nasal spray Place 2 sprays into both nostrils daily.   ibuprofen (ADVIL) 800 MG tablet Take 1 tablet (800 mg total) by mouth 2 (two)  times daily.   Melatonin 10 MG TABS Take 10 mg by mouth at bedtime.   meloxicam (MOBIC) 15 MG tablet Take 1 tablet (15 mg total) by mouth daily as needed for pain.   methocarbamol (ROBAXIN) 500 MG tablet Take 2 tablets (1,000 mg total) by mouth every 8 (eight) hours as needed for muscle spasms.   Multiple Vitamins-Minerals (MULTIVITAMIN WITH MINERALS) tablet Take 1 tablet by mouth daily.   sacubitril-valsartan (ENTRESTO) 97-103 MG Take 1 tablet by mouth 2 (two) times daily.    traZODone (DESYREL) 50 MG tablet Take 1 tablet (50 mg total) by mouth at bedtime.   [DISCONTINUED] carvedilol (COREG) 25 MG tablet Take 1.5 tablets (37.5 mg total) by mouth 2 (two) times daily with a meal.   [DISCONTINUED] hydrALAZINE (APRESOLINE) 50 MG tablet Take 2 tablets (100 mg total) by mouth 3 (three) times daily.   [DISCONTINUED] isosorbide mononitrate (IMDUR) 30 MG 24 hr tablet Take 1 tablet (30 mg total) by mouth daily.   [DISCONTINUED] rosuvastatin (CRESTOR) 10 MG tablet Take 1 tablet (10 mg total) by mouth daily.   [DISCONTINUED] spironolactone (ALDACTONE) 25 MG tablet Take 1 tablet (25 mg total) by mouth daily.     Allergies:   Strawberry extract   Social History   Socioeconomic History   Marital status: Single    Spouse name: Not on file   Number of children: Not on file   Years of education: Not on file   Highest education level: Not on file  Occupational History   Not on file  Tobacco Use   Smoking status: Former    Types: Cigarettes   Smokeless tobacco: Never  Vaping Use   Vaping Use: Never used  Substance and Sexual Activity   Alcohol use: Not Currently   Drug use: Yes    Types: Marijuana   Sexual activity: Not on file  Other Topics Concern   Not on file  Social History Narrative   Not on file   Social Determinants of Health   Financial Resource Strain: Medium Risk (06/16/2020)   Overall Financial Resource Strain (CARDIA)    Difficulty of Paying Living Expenses: Somewhat hard  Food Insecurity: No Food Insecurity (11/18/2020)   Hunger Vital Sign    Worried About Running Out of Food in the Last Year: Never true    Ran Out of Food in the Last Year: Never true  Transportation Needs: No Transportation Needs (06/16/2020)   PRAPARE - Administrator, Civil Service (Medical): No    Lack of Transportation (Non-Medical): No  Physical Activity: Not on file  Stress: Not on file  Social Connections: Not on file     Family History: [name]  family history includes Colon cancer in an other family member; Hypertension in his mother.  ROS:   Please see the history of present illness.      All other systems reviewed and are negative.  EKGs/Labs/Other Studies Reviewed:    The following studies were reviewed today: ECHO 06/08/2020:   IMPRESSIONS    1. Left ventricular ejection fraction, by estimation, is 45 to 50%. The  left ventricle has mildly decreased function. The left ventricle  demonstrates global hypokinesis. There is moderate concentric left  ventricular hypertrophy. Left ventricular  diastolic parameters are consistent with Grade II diastolic dysfunction  (pseudonormalization). Elevated left atrial pressure.   2. Right ventricular systolic function is normal. The right ventricular  size is normal. Tricuspid regurgitation signal is inadequate for assessing  PA pressure.  3. Left atrial size was mild to moderately dilated.   4. The mitral valve is grossly normal. Trivial mitral valve  regurgitation. No evidence of mitral stenosis.   5. The aortic valve is tricuspid. Aortic valve regurgitation is not  visualized. No aortic stenosis is present.   6. The inferior vena cava is normal in size with greater than 50%  respiratory variability, suggesting right atrial pressure of 3 mmHg.   R/L HEART CATH 12/23/2019 IMPRESSION: Mr. Rapozo has normal coronary arteries, elevated LVEDP and fairly normal filling pressures. He has a nonischemic cardiomyopathy probably hypertensive in nature from failure to take his prescribed medications. The antecubital venous sheath was removed and pressure held. The radial sheath was removed and a TR band was placed on the right wrist to achieve patent hemostasis. The patient left lab in stable condition. He'll be transferred back to [facility] long hospital once the TR band is off and resume guideline directed optimal medical treatment. He left the lab in stable condition.  ECHO  12/21/19  IMPRESSIONS    1. Left ventricular ejection fraction, by estimation, is <20%. The left  ventricle has severely decreased function. The left ventricle demonstrates  global hypokinesis. The left ventricular internal cavity size was  moderately dilated. There is moderate  concentric left ventricular hypertrophy. Left ventricular diastolic  parameters are consistent with Grade III diastolic dysfunction  (restrictive).   2. Right ventricular systolic function is severely reduced. The right  ventricular size is moderately enlarged.   3. Left atrial size was severely dilated.   4. Right atrial size was mildly dilated.   5. The mitral valve is normal in structure. Mild mitral valve  regurgitation. No evidence of mitral stenosis.   6. The aortic valve is normal in structure. Aortic valve regurgitation is  not visualized. No aortic stenosis is present.   7. There is mild dilatation of the ascending aorta measuring 40 mm.   8. The inferior vena cava is normal in size with greater than 50%  respiratory variability, suggesting right atrial pressure of 3 mmHg.   EKG:   11/11/2021: Sinus rhythm, first-degree AV block, rate 64, PVC, Q waves in V1-4 and 3, aVF 03/30/21: Normal sinus rhythm, rate 67, Q-wave in V1-4 2/22- NSR with first degree AV block, rate 74, TWI in I, II, aVL, V4-6  Recent Labs: 05/06/2021: Hemoglobin 13.8; Platelets 320 09/20/2021: BUN 19; Creatinine, Ser 1.22; Potassium 4.4; Sodium 141  Recent Lipid Panel    Component Value Date/Time   CHOL 212 (H) 01/05/2021 0949   TRIG 124 01/05/2021 0949   HDL 39 (L) 01/05/2021 0949   CHOLHDL 5.4 (H) 01/05/2021 0949   CHOLHDL 6.5 12/21/2019 0443   VLDL 13 12/21/2019 0443   LDLCALC 150 (H) 01/05/2021 0949    Physical Exam:    VS:  BP (!) 146/98   Pulse 64   Ht 6' (1.829 m)   Wt 293 lb 12.8 oz (133.3 kg)   SpO2 97%   BMI 39.85 kg/m     Wt Readings from Last 3 Encounters:  11/11/21 293 lb 12.8 oz (133.3 kg)  09/23/21  298 lb 6.4 oz (135.4 kg)  08/02/21 291 lb (132 kg)     GEN: Well nourished, well developed in no acute distress HEENT: Normal NECK: No JVD; No carotid bruits CARDIAC: RRR, no murmurs, rubs, gallops RESPIRATORY:  Clear to auscultation without rales, wheezing or rhonchi  ABDOMEN: Soft, non-tender, non-distended MUSCULOSKELETAL:  No edema; No deformity  SKIN: Warm and dry  NEUROLOGIC:  Alert and oriented x 3 PSYCHIATRIC:  Normal affect   ASSESSMENT:    1. Chronic combined systolic and diastolic heart failure (HCC)   2. Hypertrophic cardiomyopathy (HCC)   3. Essential hypertension        PLAN:    Chronic combined systolic and diastolic heart failure: Echocardiogram 12/21/2019 showed LVEF less than 20% with global hypokinesis, moderate LVH, grade 3 diastolic dysfunction, severe RV dysfunction, mild MR, mild dilatation of the ascending aorta measuring 40 mm.  RHC/LHC on 12/23/2019 showed normal coronary arteries and filling pressures.  Nonischemic cardiomyopathy, suspect due to uncontrolled hypertension.  Repeat echocardiogram on 06/08/2020 showed significant improvement in LV systolic function (EF 45 to 50%), moderate LVH, grade 2 diastolic dysfunction, normal RV function, no significant valvular disease.  Cardiac MRI 05/10/21 showed severe asymmetric LVH measuring 68mm in basal septum (31mm in posterior wall) consistent with HCM, patchy LGE in basal septum and RV insertion site c/w HCM (LGE accounts for 13% of total myocardial mass), subendocardial LGE in apical inferior wall suggesting small infarct, LVEF 52%, RVEF 52%. -Continue Entresto 97-103 mg twice daily.  Check BMET -Continue carvedilol 37.5 mg twice daily -Continue spironolactone 25 mg daily -Continue hydralazine 100 mg 3 times daily and Imdur 30 mg daily -He has been off [place] x3 weeks.  Will have patient fill out patient assistance forms -Given worsening shortness of breath, will check echo  HCM: Cardiac MRI 05/10/21 showed severe  asymmetric LVH measuring 41mm in basal septum (41mm in posterior wall) consistent with HCM, patchy LGE in basal septum and RV insertion site c/w HCM (LGE accounts for 13% of total myocardial mass), subendocardial LGE in apical inferior wall suggesting small infarct, LVEF 52%, RVEF 52%.  Zio x 3 days showed 1 episode of NSVT lasting 6 beats; d/w EP, stated that NSVT lasting less than 7 beats in HCM is not concerning, no other work-up recommended at this time.  He has 2 children, twins age 57, recommend that they be screened with echocardiogram  Hypertension: Continue carvedilol, spironolactone, hydralazine/Imdur, Sherryll Burger.  BP elevated in clinic today but reports he just took his meds.  Asked to check BP twice daily at home x1 week   Cocaine use: Denies recent use.  Encouraged continued cessation  Marijuana use: Denies recent use.  Encouraged continued cessation  GERD: Continue famotidine  Snoring: negative sleep study 05/16/21  Hyperlipidemia: LDL 150 12/2020.  10-year ASCVD risk score 10.5%.  Started on atorvastatin but reports did not tolerate due to palpitations when taking.  He was switched to rosuvastatin 10 mg daily, which he is tolerating  RTC in 3 months   Medication Adjustments/Labs and Tests Ordered: Current medicines are reviewed at length with the patient today.  Concerns regarding medicines are outlined above.  Orders Placed This Encounter  Procedures   Basic metabolic panel   EKG 12-Lead   ECHOCARDIOGRAM COMPLETE     Meds ordered this encounter  Medications   carvedilol (COREG) 25 MG tablet    Sig: Take 1.5 tablets (37.5 mg total) by mouth 2 (two) times daily with a meal.    Dispense:  270 tablet    Refill:  3    Dose increase   hydrALAZINE (APRESOLINE) 50 MG tablet    Sig: Take 2 tablets (100 mg total) by mouth 3 (three) times daily.    Dispense:  540 tablet    Refill:  3   isosorbide mononitrate (IMDUR) 30 MG 24 hr tablet    Sig: Take 1  tablet (30 mg total) by  mouth daily.    Dispense:  90 tablet    Refill:  3   rosuvastatin (CRESTOR) 10 MG tablet    Sig: Take 1 tablet (10 mg total) by mouth daily.    Dispense:  90 tablet    Refill:  3   spironolactone (ALDACTONE) 25 MG tablet    Sig: Take 1 tablet (25 mg total) by mouth daily.    Dispense:  90 tablet    Refill:  3     Patient Instructions  Medication Instructions:  Your physician recommends that you continue on your current medications as directed. Please refer to the Current Medication list given to you today.  *If you need a refill on your cardiac medications before your next appointment, please call your pharmacy*   Lab Work: Please return for labs  (BMET)  Our in office lab hours are Monday-Friday 8:00-4:00, closed for lunch 12:45-1:45 pm.  No appointment needed.  [facility] locations:   [facility] - [address]  - [address] ([facility]) - [address] [phone] [address]   [place] - [address]    [place]  - [address]    [place] - [address] - [address] [employer]  - [address] - [address] Chief Technology Officer)  Testing/Procedures: Your physician has requested that you have an echocardiogram. Echocardiography is a painless test that uses sound waves to create images of your heart. It provides your doctor with information about the size and shape of your heart and how well your heart's chambers and valves are working. This procedure takes approximately one hour. There are no restrictions for this procedure.  Follow-Up: At [facility], you and your health needs are our priority.  As part of our continuing mission to provide you with exceptional heart care, we have created designated Provider Care Teams.  These Care Teams include your primary Cardiologist (physician) and Advanced Practice Providers (APPs -   Physician Assistants and Nurse Practitioners) who all work together to provide you with the care you need, when you need it.  We recommend signing up for the patient portal called "MyChart".  Sign up information is provided on this After Visit Summary.  MyChart is used to connect with patients for Virtual Visits (Telemedicine).  Patients are able to view lab/test results, encounter notes, upcoming appointments, etc.  Non-urgent messages can be sent to your provider as well.   To learn more about what you can do with MyChart, go to [email or web address].    Your next appointment:   3 month(s)  The format for your next appointment:   In Person  Provider:   Little Ishikawa, MD  {    Please check your blood pressure at home twice daily, write it down.  Call the office or send message via Mychart with the readings in 1 week for Dr. Bjorn Pippin to review.   Important Information About Sugar          Signed, Little Ishikawa, MD  11/12/2021 6:41 AM     [facility]

## 2021-11-11 ENCOUNTER — Ambulatory Visit (INDEPENDENT_AMBULATORY_CARE_PROVIDER_SITE_OTHER): Payer: Self-pay | Admitting: Cardiology

## 2021-11-11 ENCOUNTER — Telehealth (HOSPITAL_COMMUNITY): Payer: No Payment, Other | Admitting: Physician Assistant

## 2021-11-11 ENCOUNTER — Encounter: Payer: Self-pay | Admitting: Cardiology

## 2021-11-11 VITALS — BP 146/98 | HR 64 | Ht 72.0 in | Wt 293.8 lb

## 2021-11-11 DIAGNOSIS — I5042 Chronic combined systolic (congestive) and diastolic (congestive) heart failure: Secondary | ICD-10-CM

## 2021-11-11 DIAGNOSIS — I422 Other hypertrophic cardiomyopathy: Secondary | ICD-10-CM

## 2021-11-11 DIAGNOSIS — I10 Essential (primary) hypertension: Secondary | ICD-10-CM

## 2021-11-11 MED ORDER — CARVEDILOL 25 MG PO TABS: 37.5000 mg | ORAL_TABLET | Freq: Two times a day (BID) | ORAL | 3 refills | Status: DC

## 2021-11-11 MED ORDER — HYDRALAZINE HCL 50 MG PO TABS: 100.0000 mg | ORAL_TABLET | Freq: Three times a day (TID) | ORAL | 3 refills | Status: DC

## 2021-11-11 MED ORDER — ROSUVASTATIN CALCIUM 10 MG PO TABS: 10.0000 mg | ORAL_TABLET | Freq: Every day | ORAL | 3 refills | Status: DC

## 2021-11-11 MED ORDER — ISOSORBIDE MONONITRATE ER 30 MG PO TB24: 30.0000 mg | ORAL_TABLET | Freq: Every day | ORAL | 3 refills | Status: DC

## 2021-11-11 MED ORDER — SPIRONOLACTONE 25 MG PO TABS: 25.0000 mg | ORAL_TABLET | Freq: Every day | ORAL | 3 refills | Status: DC

## 2021-11-11 NOTE — Patient Instructions (Addendum)
Medication Instructions:  Your physician recommends that you continue on your current medications as directed. Please refer to the Current Medication list given to you today.  *If you need a refill on your cardiac medications before your next appointment, please call your pharmacy*   Lab Work: Please return for labs  (BMET)  Our in office lab hours are Monday-Friday 8:00-4:00, closed for lunch 12:45-1:45 pm.  No appointment needed.  [facility] locations:   [facility] - [address]  - [address] ([facility]) - [address] [phone] [address]   [place] - [address]    [place]  - [address]    [place] - [address] - [address] [employer]  - [address] [place] - [address] Chief Technology Officer)  Testing/Procedures: Your physician has requested that you have an echocardiogram. Echocardiography is a painless test that uses sound waves to create images of your heart. It provides your doctor with information about the size and shape of your heart and how well your heart's chambers and valves are working. This procedure takes approximately one hour. There are no restrictions for this procedure.  Follow-Up: At [facility], you and your health needs are our priority.  As part of our continuing mission to provide you with exceptional heart care, we have created designated Provider Care Teams.  These Care Teams include your primary Cardiologist (physician) and Advanced Practice Providers (APPs -  Physician Assistants and Nurse Practitioners) who all work together to provide you with the care you need, when you need it.  We recommend signing up for the patient portal called "MyChart".  Sign up information is provided on this After Visit Summary.  MyChart is used to connect with patients for Virtual Visits (Telemedicine).  Patients are able to  view lab/test results, encounter notes, upcoming appointments, etc.  Non-urgent messages can be sent to your provider as well.   To learn more about what you can do with MyChart, go to [email or web address].    Your next appointment:   3 month(s)  The format for your next appointment:   In Person  Provider:   Little Ishikawa, MD  {    Please check your blood pressure at home twice daily, write it down.  Call the office or send message via Mychart with the readings in 1 week for Dr. Bjorn Pippin to review.   Important Information About Sugar

## 2021-11-12 ENCOUNTER — Telehealth: Payer: Self-pay | Admitting: *Deleted

## 2021-11-12 NOTE — Telephone Encounter (Signed)
Farxiga [place] ?

## 2021-11-15 ENCOUNTER — Ambulatory Visit: Payer: Self-pay | Admitting: Rehabilitative and Restorative Service Providers"

## 2021-11-17 NOTE — Telephone Encounter (Signed)
Patient called stating his application for dapagliflozin propanediol (FARXIGA) 10 MG TABS tablet was declined.  He would like to know his next steps.

## 2021-11-17 NOTE — Telephone Encounter (Signed)
Attempted to call [employer] to discuss-unable to reach.  Called patient- they called him to notify him of the denial. They stated he should be eligible for medicaid.    Discussed with CSW-patient not eligible for medicaid.  Will create letter and fax to [employer].     Advised patient to let office know if they call him with an update.

## 2021-11-18 LAB — BASIC METABOLIC PANEL
BUN/Creatinine Ratio: 12 (ref 9–20)
BUN: 13 mg/dL (ref 6–24)
CO2: 21 mmol/L (ref 20–29)
Calcium: 9.7 mg/dL (ref 8.7–10.2)
Chloride: 103 mmol/L (ref 96–106)
Creatinine, Ser: 1.12 mg/dL (ref 0.76–1.27)
Glucose: 110 mg/dL — ABNORMAL HIGH (ref 70–99)
Potassium: 3.9 mmol/L (ref 3.5–5.2)
Sodium: 141 mmol/L (ref 134–144)
eGFR: 80 mL/min/{1.73_m2} (ref >59)

## 2021-11-22 ENCOUNTER — Ambulatory Visit (HOSPITAL_COMMUNITY): Payer: No Payment, Other | Admitting: Licensed Clinical Social Worker

## 2021-11-24 NOTE — Telephone Encounter (Signed)
Received fax from [employer]-need documentation of medicaid denial.     Patient aware and will see if he still has a copy, if not he will go to DSS and get copy of letter.

## 2021-11-29 ENCOUNTER — Ambulatory Visit (HOSPITAL_COMMUNITY): Payer: Self-pay | Attending: Cardiology

## 2021-11-29 DIAGNOSIS — I5042 Chronic combined systolic (congestive) and diastolic (congestive) heart failure: Secondary | ICD-10-CM | POA: Insufficient documentation

## 2021-11-29 LAB — ECHOCARDIOGRAM COMPLETE
Area-P 1/2: 2.91 cm2
S' Lateral: 3.6 cm

## 2021-12-06 NOTE — Telephone Encounter (Signed)
Denial letter received.   Faxed to [employer].

## 2021-12-07 ENCOUNTER — Ambulatory Visit: Payer: Self-pay | Admitting: Orthopaedic Surgery

## 2021-12-10 ENCOUNTER — Ambulatory Visit: Payer: Self-pay | Admitting: Orthopaedic Surgery

## 2021-12-13 ENCOUNTER — Ambulatory Visit (INDEPENDENT_AMBULATORY_CARE_PROVIDER_SITE_OTHER): Payer: No Payment, Other | Admitting: Licensed Clinical Social Worker

## 2021-12-13 DIAGNOSIS — F4323 Adjustment disorder with mixed anxiety and depressed mood: Secondary | ICD-10-CM

## 2021-12-13 NOTE — Progress Notes (Signed)
   THERAPIST PROGRESS NOTE  Session Time: 60 minutes  Participation Level: {BHH PARTICIPATION LEVEL:22264}  Behavioral Response: {Appearance:22683}{BHH LEVEL OF CONSCIOUSNESS:22305}{BHH MOOD:22306}  Type of Therapy: {CHL AMB BH Type of Therapy:21022741}  Treatment Goals addressed: ***  ProgressTowards Goals: {Progress Towards Goals:21014066}  Interventions: {CHL AMB BH Type of Intervention:21022753}  Summary: Ronald Young is a 51 y.o. male who presents with ***.   Suicidal/Homicidal: {BHH YES OR NO:22294}{yes/no/with/without intent/plan:22693}  Therapist Response: ***  Plan: Return again in 1 weeks.  Diagnosis: Adjustment disorder with mixed anxiety and depressed mood  Collaboration of Care: Other none required for this visit  Patient/Guardian was advised Release of Information must be obtained prior to any record release in order to collaborate their care with an outside provider. Patient/Guardian was advised if they have not already done so to contact the registration department to sign all necessary forms in order for [place] to release information regarding their care.   Consent: Patient/Guardian gives verbal consent for treatment and assignment of benefits for services provided during this visit. Patient/Guardian expressed understanding and agreed to proceed.   Wyvonnia Lora, LCSWA 12/13/2021

## 2021-12-16 NOTE — Telephone Encounter (Addendum)
Received notification: Patient approved for patient assistance-[place] through [employer].    Start date: 12/08/2021 End date: 12/09/2022  Patient ID: [id number]

## 2021-12-22 ENCOUNTER — Ambulatory Visit (INDEPENDENT_AMBULATORY_CARE_PROVIDER_SITE_OTHER): Payer: No Payment, Other | Admitting: Licensed Clinical Social Worker

## 2021-12-22 DIAGNOSIS — F4323 Adjustment disorder with mixed anxiety and depressed mood: Secondary | ICD-10-CM
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
## 2022-01-04 ENCOUNTER — Telehealth: Payer: Self-pay | Admitting: Licensed Clinical Social Worker

## 2022-01-04 NOTE — Telephone Encounter (Signed)
H&V Care Navigation CSW Progress Note  Clinical Social Worker  mailed pt a new Haematologist and [employer] application  to pt along with information about needed documents and how to submit. Pt prior approvals expire on 9/16 (CAFA) and 9/20 [facility] Card). I remain available.   Patient is participating in a Managed Medicaid Plan:  No, self pay only- [place] and [place] approved  SDOH Screenings   Food Insecurity: No Food Insecurity (11/18/2020)  Housing: Low Risk  (06/16/2020)  Transportation Needs: No Transportation Needs (06/16/2020)  Alcohol Screen: Low Risk  (01/27/2021)  Depression (PHQ2-9): High Risk (09/09/2021)  Financial Resource Strain: Medium Risk (06/16/2020)  Tobacco Use: Medium Risk (11/11/2021)    [name], MSW, LCSW Clinical Social Worker [facility] Heart/Vascular Care Navigation  [phone]- work cell phone (preferred) [phone]- desk phone

## 2022-01-11 ENCOUNTER — Encounter: Payer: Self-pay | Admitting: Orthopaedic Surgery

## 2022-01-11 ENCOUNTER — Ambulatory Visit (INDEPENDENT_AMBULATORY_CARE_PROVIDER_SITE_OTHER): Payer: Self-pay | Admitting: Orthopaedic Surgery

## 2022-01-11 DIAGNOSIS — S39012D Strain of muscle, fascia and tendon of lower back, subsequent encounter: Secondary | ICD-10-CM

## 2022-01-11 DIAGNOSIS — S29019D Strain of muscle and tendon of unspecified wall of thorax, subsequent encounter: Secondary | ICD-10-CM

## 2022-01-11 MED ORDER — MELOXICAM 15 MG PO TABS: 15.0000 mg | ORAL_TABLET | Freq: Every day | ORAL | 1 refills | Status: DC

## 2022-01-11 MED ORDER — METHOCARBAMOL 500 MG PO TABS: 500.0000 mg | ORAL_TABLET | Freq: Three times a day (TID) | ORAL | 0 refills | Status: DC | PRN
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
## 2022-01-26 ENCOUNTER — Other Ambulatory Visit (HOSPITAL_BASED_OUTPATIENT_CLINIC_OR_DEPARTMENT_OTHER): Payer: Self-pay

## 2022-02-07 ENCOUNTER — Telehealth: Payer: Self-pay | Admitting: Cardiology

## 2022-02-07 MED ORDER — SPIRONOLACTONE 25 MG PO TABS: 25.0000 mg | ORAL_TABLET | Freq: Every day | ORAL | 3 refills | Status: DC

## 2022-02-07 MED ORDER — ISOSORBIDE MONONITRATE ER 30 MG PO TB24: 30.0000 mg | ORAL_TABLET | Freq: Every day | ORAL | 3 refills | Status: DC

## 2022-02-07 NOTE — Telephone Encounter (Signed)
*  STAT* If patient is at the pharmacy, call can be transferred to refill team.   1. Which medications need to be refilled? (please list name of each medication and dose if known)  spironolactone (ALDACTONE) 25 MG tablet isosorbide mononitrate (IMDUR) 30 MG 24 hr tablet  2. Which pharmacy/location (including street and city if local pharmacy) is medication to be sent to? [facility] ([place] [place] - [address]  3. Do they need a 30 day or 90 day supply?  90 day supply

## 2022-02-07 NOTE — Telephone Encounter (Signed)
Refills for Isosorbide and Spironolactone has been sent to [facility].

## 2022-02-08 NOTE — Progress Notes (Signed)
Cardiology Clinic Note   Patient Name: Ronald Young Date of Encounter: 02/11/2022  Primary Care Provider:  Hoy Register, MD Primary Cardiologist:  Ronald Ishikawa, MD  Patient Profile    Ronald Young 51 year old male presents to the clinic today for follow-up evaluation of chest pain, shortness of breath, and palpitations.  Past Medical History    Past Medical History:  Diagnosis Date   Arthritis    Cardiomyopathy (HCC) 09/22/2014   Hypertension    Past Surgical History:  Procedure Laterality Date   RIGHT/LEFT HEART CATH AND CORONARY ANGIOGRAPHY N/A 12/23/2019   Procedure: RIGHT/LEFT HEART CATH AND CORONARY ANGIOGRAPHY;  Surgeon: Runell Gess, MD;  Location: [facility];  Service: Cardiovascular;  Laterality: N/A;    Allergies  Allergies  Allergen Reactions   Strawberry Extract Hives    History of Present Illness    Ronald Young has a past medical history of nonischemic cardiomyopathy with an EF of 25% in 2016, bilateral carotid artery disease mild, HTN, noncompliance, and cocaine abuse.  He underwent left heart cath/right heart cath 12/23/2019.  He was found to have normal filling pressures, nonischemic cardiomyopathy, with normal coronary arteries.  He was discharged in stable condition on 12/24/2019.   He returned to the emergency department on 12/29/2019 with myalgias.  On arrival his blood pressure was 181/125.  He denied chest pain, shortness of breath, headache, dizziness, numbness, dysuria, and confusion.  He indicated that he developed myalgias the night before.  He described the pain in his right upper extremity near his bicep which was tender to touch.  He then indicated that they resolved without intervention.  He was curious to know if this was related to his new medications.  At the time of our evaluation he denied any bicep pain, numbness, tingling or radiation down his arm.  He denied chest pain.  He denied any recent new exercise  activities or lifting.  Pain was nonradiating.  He denied symptoms of COVID-19 and other myalgias.  No deformity was noted near his bicep.  No discoloration, warmth or erythema.  It was felt his myalgias could be due to his spironolactone, or a muscle strain.  Blood pressure decreased to 154/108.  He was discharged in stable condition.   He returned to the clinic 01/02/2020 for follow-up evaluation and stated he felt well.  His breathing was much better.  He stated that he was following a low-sodium diet and planned to begin walking daily with his mother.  He had no further arm pain.  He had been compliant with his medications and has not had any side effects.  Blood pressure was elevated  and he reported that it had been similar at home.  I increased his losartan to 100 milligrams daily.    I planned follow-up in 1 month.  He was seen in follow-up by Ronald Young on 11/11/2021.  During that time he reported he was doing okay.  He did note some shortness of breath that week.  He has been walking around his house which caused his dyspnea.  He denied chest pain, lower extremity swelling.  He reported occasional episodes of lightheadedness but denied syncope.  He reported rare episodes of palpitations which were short in duration.  He had been off of his [name] for 3 weeks.  He presents to the clinic today for follow-up evaluation states he feels well.  He presents with his mom.  He reports compliance with his medications.  He is  walking 3 days/week.  He reports a low-sodium diet.  He has continued to abstain from recreational drug use.  We reviewed the importance of modifiable risk factors.  He expressed understanding.  I encouraged him to weigh himself daily and keep a weight log.  I will refill his medications for 12 months and have him increase his physical activity as tolerated.   Today he denies chest pain, shortness of breath, lower extremity edema, fatigue, palpitations, melena, hematuria, hemoptysis,  diaphoresis, weakness, presyncope, syncope, orthopnea, and PND     Home Medications    Prior to Admission medications   Medication Sig Start Date End Date Taking? Authorizing Provider  aspirin 81 MG chewable tablet Chew 1 tablet (81 mg total) by mouth daily. 11/28/20   Milagros Loll, MD  carvedilol (COREG) 25 MG tablet Take 1.5 tablets (37.5 mg total) by mouth 2 (two) times daily with a meal. 11/11/21   Ronald Ishikawa, MD  cetirizine (ZYRTEC) 10 MG tablet Take 1 tablet (10 mg total) by mouth daily. 04/06/21   Ronald Register, MD  dapagliflozin propanediol (FARXIGA) 10 MG TABS tablet Take 1 tablet (10 mg total) by mouth daily. 11/04/21   Ronald Ishikawa, MD  diclofenac Sodium (VOLTAREN) 1 % GEL Apply 2 g topically 4 (four) times daily. 10/13/20   Jeannie Fend, PA-C  escitalopram (LEXAPRO) 20 MG tablet Take 1 tablet (20 mg total) by mouth daily. 09/23/21 09/23/22  Anders Simmonds, PA-C  fluticasone (FLONASE) 50 MCG/ACT nasal spray Place 2 sprays into both nostrils daily. 09/23/21   Anders Simmonds, PA-C  hydrALAZINE (APRESOLINE) 50 MG tablet Take 2 tablets (100 mg total) by mouth 3 (three) times daily. 11/11/21 11/06/22  Ronald Ishikawa, MD  isosorbide mononitrate (IMDUR) 30 MG 24 hr tablet Take 1 tablet (30 mg total) by mouth daily. 02/07/22 02/02/23  Ronald Ishikawa, MD  Melatonin 10 MG TABS Take 10 mg by mouth at bedtime. 07/28/21   Nwoko, Tommas Olp, PA  meloxicam (MOBIC) 15 MG tablet Take 1 tablet (15 mg total) by mouth daily as needed for pain. 01/11/22   Eldred Manges, MD  methocarbamol (ROBAXIN) 500 MG tablet Take 1 tablet (500 mg total) by mouth every 8 (eight) hours as needed for muscle spasms. 01/11/22   Eldred Manges, MD  Multiple Vitamins-Minerals (MULTIVITAMIN WITH MINERALS) tablet Take 1 tablet by mouth daily.    [provider]  rosuvastatin (CRESTOR) 10 MG tablet Take 1 tablet (10 mg total) by mouth daily. 11/11/21 11/06/22  Ronald Ishikawa, MD  sacubitril-valsartan (ENTRESTO) 97-103 MG Take 1 tablet by mouth 2 (two) times daily. 07/05/21   Ronald Ishikawa, MD  spironolactone (ALDACTONE) 25 MG tablet Take 1 tablet (25 mg total) by mouth daily. 02/07/22   Ronald Ishikawa, MD  traZODone (DESYREL) 50 MG tablet Take 1 tablet (50 mg total) by mouth at bedtime. 09/23/21   Anders Simmonds, PA-C    Family History    Family History  Problem Relation Age of Onset   Hypertension Mother    Colon cancer Other    He indicated that his mother is alive. He indicated that his father is alive. He indicated that the status of his other is unknown.  Social History    Social History   Socioeconomic History   Marital status: Single    Spouse name: Not on file   Number of children: Not on file   Years of education: Not on file  Highest education level: Not on file  Occupational History   Not on file  Tobacco Use   Smoking status: Former    Types: Cigarettes   Smokeless tobacco: Never  Vaping Use   Vaping Use: Never used  Substance and Sexual Activity   Alcohol use: Not Currently   Drug use: Yes    Types: Marijuana   Sexual activity: Not on file  Other Topics Concern   Not on file  Social History Narrative   Not on file   Social Determinants of Health   Financial Resource Strain: Medium Risk (06/16/2020)   Overall Financial Resource Strain (CARDIA)    Difficulty of Paying Living Expenses: Somewhat hard  Food Insecurity: No Food Insecurity (11/18/2020)   Hunger Vital Sign    Worried About Running Out of Food in the Last Year: Never true    Ran Out of Food in the Last Year: Never true  Transportation Needs: No Transportation Needs (06/16/2020)   PRAPARE - Administrator, Civil Service (Medical): No    Lack of Transportation (Non-Medical): No  Physical Activity: Not on file  Stress: Not on file  Social Connections: Not on file  Intimate Partner Violence: Not on file     Review of Systems     General:  No chills, fever, night sweats or weight changes.  Cardiovascular:  No chest pain, dyspnea on exertion, edema, orthopnea, palpitations, paroxysmal nocturnal dyspnea. Dermatological: No rash, lesions/masses Respiratory: No cough, dyspnea Urologic: No hematuria, dysuria Abdominal:   No nausea, vomiting, diarrhea, bright red blood per rectum, melena, or hematemesis Neurologic:  No visual changes, wkns, changes in mental status. All other systems reviewed and are otherwise negative except as noted above.  Physical Exam    VS:  BP 116/78   Pulse 70   Ht 6' (1.829 m)   Wt 281 lb 9.6 oz (127.7 kg)   SpO2 98%   BMI 38.19 kg/m  , BMI Body mass index is 38.19 kg/m. GEN: Well nourished, well developed, in no acute distress. HEENT: normal. Neck: Supple, no JVD, carotid bruits, or masses. Cardiac: RRR, no murmurs, rubs, or gallops. No clubbing, cyanosis, edema.  Radials/DP/PT 2+ and equal bilaterally.  Respiratory:  Respirations regular and unlabored, clear to auscultation bilaterally. GI: Soft, nontender, nondistended, BS + x 4. MS: no deformity or atrophy. Skin: warm and dry, no rash. Neuro:  Strength and sensation are intact. Psych: Normal affect.  Accessory Clinical Findings    Recent Labs: 05/06/2021: Hemoglobin 13.8; Platelets 320 11/17/2021: BUN 13; Creatinine, Ser 1.12; Potassium 3.9; Sodium 141   Recent Lipid Panel    Component Value Date/Time   CHOL 212 (H) 01/05/2021 0949   TRIG 124 01/05/2021 0949   HDL 39 (L) 01/05/2021 0949   CHOLHDL 5.4 (H) 01/05/2021 0949   CHOLHDL 6.5 12/21/2019 0443   VLDL 13 12/21/2019 0443   LDLCALC 150 (H) 01/05/2021 0949         ECG personally reviewed by me today- none today.  Echocardiogram 12/21/2019 IMPRESSIONS     1. Left ventricular ejection fraction, by estimation, is <20%. The left  ventricle has severely decreased function. The left ventricle demonstrates  global hypokinesis. The left ventricular internal cavity  size was  moderately dilated. There is moderate  concentric left ventricular hypertrophy. Left ventricular diastolic  parameters are consistent with Grade III diastolic dysfunction  (restrictive).   2. Right ventricular systolic function is severely reduced. The right  ventricular size is moderately enlarged.  3. Left atrial size was severely dilated.   4. Right atrial size was mildly dilated.   5. The mitral valve is normal in structure. Mild mitral valve  regurgitation. No evidence of mitral stenosis.   6. The aortic valve is normal in structure. Aortic valve regurgitation is  not visualized. No aortic stenosis is present.   7. There is mild dilatation of the ascending aorta measuring 40 mm.   8. The inferior vena cava is normal in size with greater than 50%  respiratory variability, suggesting right atrial pressure of 3 mmHg.   Cardiac catheterization 12/23/2019 CARDIAC CATHETERIZATION        History obtained from chart review. 51 y.o. male with a hx of reported nonischemic cardiomyopathy (EF of 25% per echo in 2016 with Myoview in 2016 showing no perfusion defects),carotid artery disease (1-39% stenosis B/L), LVH, HTN, non-compliance, prior cocaine use who was admitted with shortness of breath. His enzymes were mildly elevated but flat. His BNP was elevated. Repeat 2D echo revealed an EF of less than 20%. Apparently has not taken his medications for the last 5 years. He was referred for right left heart cath to define his anatomy and physiology.   IMPRESSION: Mr. Hevener has normal coronary arteries, elevated LVEDP and fairly normal filling pressures. He has a nonischemic cardiomyopathy probably hypertensive in nature from failure to take his prescribed medications. The antecubital venous sheath was removed and pressure held. The radial sheath was removed and a TR band was placed on the right wrist to achieve patent hemostasis. The patient left lab in stable condition. He'll be transferred  back to [facility] long hospital once the TR band is off and resume guideline directed optimal medical treatment. He left the lab in stable condition.     Diagnostic Dominance: Right  Intervention    Assessment & Plan   1.  Acute on chronic combined CHF/nonischemic cardiomyopathy-continues to lose weight.  Weight today 281.6 pounds.  Euvolemic.  No increased DOE or shortness of breath today.  Echocardiogram 2/22  45-50% LVEF, grade 2 diastolic dysfunction, left atrium mild-moderately dilated, and trivial mitral valve regurgitation..  Previous cardiac catheterization showed normal coronary arteries with elevated LVEDP.  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  History of noncompliance previously blood pressure noted to be 207/64 in the ED Continue , carvedilol, spironolactone Heart healthy low-sodium diet-salty 6 given Increase physical activity as tolerated Increase losartan to 100 mg daily Keep blood pressure log and bring to next appointment   AKI-creatinine 1.18 on 12/24/2019 Continue to monitor   History of cocaine use/abuse-denies recent drug use Urine drug screen negative on prior admission   Disposition: Follow-up with Dr. Gardiner Rhyme or me in 12 months  Jossie Ng. Dannie Hattabaugh NP-C     02/11/2022, 4:13 PM [facility] [address] Office [phone] Fax [phone]  Notice: This dictation was prepared with Dragon dictation along with smaller phrase technology. Any transcriptional errors that result from this process are unintentional and may not be corrected upon review.  I spent 14 minutes examining this patient, reviewing medications, and using patient centered shared decision making involving her cardiac care.  Prior to her visit I spent greater than 20  minutes reviewing her past medical history,  medications, and prior cardiac tests.

## 2022-02-11 ENCOUNTER — Ambulatory Visit: Payer: Self-pay | Attending: General Practice | Admitting: General Practice

## 2022-02-11 ENCOUNTER — Encounter: Payer: Self-pay | Admitting: General Practice

## 2022-02-11 VITALS — BP 116/78 | HR 70 | Ht 72.0 in | Wt 281.6 lb

## 2022-02-11 DIAGNOSIS — I5022 Chronic systolic (congestive) heart failure: Secondary | ICD-10-CM

## 2022-02-11 DIAGNOSIS — I5042 Chronic combined systolic (congestive) and diastolic (congestive) heart failure: Secondary | ICD-10-CM

## 2022-02-11 DIAGNOSIS — I10 Essential (primary) hypertension: Secondary | ICD-10-CM

## 2022-02-11 DIAGNOSIS — F1490 Cocaine use, unspecified, uncomplicated: Secondary | ICD-10-CM

## 2022-02-11 DIAGNOSIS — I110 Hypertensive heart disease with heart failure: Secondary | ICD-10-CM

## 2022-02-11 DIAGNOSIS — N179 Acute kidney failure, unspecified: Secondary | ICD-10-CM

## 2022-02-11 DIAGNOSIS — F1290 Cannabis use, unspecified, uncomplicated: Secondary | ICD-10-CM

## 2022-02-11 DIAGNOSIS — I428 Other cardiomyopathies: Secondary | ICD-10-CM

## 2022-02-11 MED ORDER — SPIRONOLACTONE 25 MG PO TABS: 25.0000 mg | ORAL_TABLET | Freq: Every day | ORAL | 3 refills | Status: DC

## 2022-02-11 MED ORDER — ENTRESTO 97-103 MG PO TABS: 1.0000 | ORAL_TABLET | Freq: Two times a day (BID) | ORAL | 3 refills | Status: DC

## 2022-02-11 MED ORDER — ISOSORBIDE MONONITRATE ER 30 MG PO TB24: 30.0000 mg | ORAL_TABLET | Freq: Every day | ORAL | 3 refills | Status: DC

## 2022-02-11 MED ORDER — ROSUVASTATIN CALCIUM 10 MG PO TABS: 10.0000 mg | ORAL_TABLET | Freq: Every day | ORAL | 3 refills | Status: DC

## 2022-02-11 MED ORDER — DAPAGLIFLOZIN PROPANEDIOL 10 MG PO TABS: 10.0000 mg | ORAL_TABLET | Freq: Every day | ORAL | 4 refills | Status: DC

## 2022-02-11 MED ORDER — CARVEDILOL 25 MG PO TABS: 37.5000 mg | ORAL_TABLET | Freq: Two times a day (BID) | ORAL | 3 refills | Status: DC

## 2022-02-11 MED ORDER — HYDRALAZINE HCL 50 MG PO TABS: 100.0000 mg | ORAL_TABLET | Freq: Three times a day (TID) | ORAL | 3 refills | Status: DC

## 2022-02-11 NOTE — Patient Instructions (Signed)
Medication Instructions:  The current medical regimen is effective;  continue present plan and medications as directed. Please refer to the Current Medication list given to you today.  *If you need a refill on your cardiac medications before your next appointment, please call your pharmacy*   Lab Work: NONE  Testing/Procedures: NONE  Other Instructions INCREASE PHYSICAL ACTIVITY AS TOLERATED  PLEASE READ AND FOLLOW ATTACHED  SALTY 6  Follow-Up: At [facility], you and your health needs are our priority.  As part of our continuing mission to provide you with exceptional heart care, we have created designated Provider Care Teams.  These Care Teams include your primary Cardiologist (physician) and Advanced Practice Providers (APPs -  Physician Assistants and Nurse Practitioners) who all work together to provide you with the care you need, when you need it.  We recommend signing up for the patient portal called "MyChart".  Sign up information is provided on this After Visit Summary.  MyChart is used to connect with patients for Virtual Visits (Telemedicine).  Patients are able to view lab/test results, encounter notes, upcoming appointments, etc.  Non-urgent messages can be sent to your provider as well.   To learn more about what you can do with MyChart, go to [email or web address].    Your next appointment:   9-12 month(s)  The format for your next appointment:   In Person  Provider:   Donato Heinz, MD     Important Information About Sugar

## 2022-02-14 ENCOUNTER — Ambulatory Visit (INDEPENDENT_AMBULATORY_CARE_PROVIDER_SITE_OTHER): Payer: No Payment, Other | Admitting: Licensed Clinical Social Worker

## 2022-02-14 DIAGNOSIS — F4323 Adjustment disorder with mixed anxiety and depressed mood: Secondary | ICD-10-CM

## 2022-02-14 NOTE — Progress Notes (Incomplete)
   THERAPIST PROGRESS NOTE  Session Time: 55 minutes  Participation Level: {BHH PARTICIPATION LEVEL:22264}  Behavioral Response: {Appearance:22683}{BHH LEVEL OF CONSCIOUSNESS:22305}{BHH MOOD:22306}  Type of Therapy: {CHL AMB BH Type of Therapy:21022741}  Treatment Goals addressed: ***  ProgressTowards Goals: {Progress Towards Goals:21014066}  Interventions: {CHL AMB BH Type of Intervention:21022753}  Summary: Ronald Young is a 51 y.o. male who presents with ***. He is receptive to feedback from cln.  Suicidal/Homicidal: {BHH YES OR NO:22294}{yes/no/with/without intent/plan:22693}  Therapist Response: ***Facts vs assumptions. Encouraged him to consider his assumptions regarding others' motivations/feelings may be inaccurate.   Plan: Return again in 2 weeks.  Diagnosis: Adjustment disorder with mixed anxiety and depressed mood  Collaboration of Care: {BH OP Collaboration of Care:21014065}  Patient/Guardian was advised Release of Information must be obtained prior to any record release in order to collaborate their care with an outside provider. Patient/Guardian was advised if they have not already done so to contact the registration department to sign all necessary forms in order for [place] to release information regarding their care.   Consent: Patient/Guardian gives verbal consent for treatment and assignment of benefits for services provided during this visit. Patient/Guardian expressed understanding and agreed to proceed.   Heron Nay, LCSWA 02/14/2022

## 2022-03-01 ENCOUNTER — Telehealth (HOSPITAL_COMMUNITY): Payer: Self-pay | Admitting: Licensed Clinical Social Worker

## 2022-03-01 ENCOUNTER — Ambulatory Visit (HOSPITAL_COMMUNITY): Payer: No Payment, Other | Admitting: Licensed Clinical Social Worker

## 2022-03-02 NOTE — Telephone Encounter (Signed)
Cln spoke to pt at front desk. Pt agreed to cancel appt due to not liking virtual and reschedule for next available.

## 2022-03-10 ENCOUNTER — Telehealth: Payer: Self-pay | Admitting: Licensed Clinical Social Worker

## 2022-03-10 NOTE — Telephone Encounter (Signed)
H&V Care Navigation CSW Progress Note  Clinical Social Worker  mailed pt additional copies of [employer] and [employer]  to renew eligibility. I also mailed pt my card and copy of the Medicaid expansion flyer as beginning in December pt may want to see if newly eligible for full Medicaid.   Patient is participating in a Managed Medicaid Plan:  No, self pay only.   SDOH Screenings   Food Insecurity: No Food Insecurity (11/18/2020)  Housing: Low Risk  (06/16/2020)  Transportation Needs: No Transportation Needs (06/16/2020)  Alcohol Screen: Low Risk  (01/27/2021)  Depression (PHQ2-9): High Risk (09/09/2021)  Financial Resource Strain: Medium Risk (06/16/2020)  Tobacco Use: Medium Risk (02/11/2022)   Ronald Young, MSW, LCSW Clinical Social Worker II [facility] Heart/Vascular Care Navigation  [phone]- work cell phone (preferred) [phone]- desk phone

## 2022-03-28 ENCOUNTER — Ambulatory Visit (HOSPITAL_COMMUNITY): Payer: No Payment, Other | Admitting: Licensed Clinical Social Worker

## 2022-04-12 ENCOUNTER — Ambulatory Visit: Payer: Self-pay | Admitting: Orthopaedic Surgery

## 2022-04-19 ENCOUNTER — Ambulatory Visit (INDEPENDENT_AMBULATORY_CARE_PROVIDER_SITE_OTHER): Payer: Self-pay

## 2022-04-19 ENCOUNTER — Ambulatory Visit (INDEPENDENT_AMBULATORY_CARE_PROVIDER_SITE_OTHER): Payer: Self-pay | Admitting: Orthopaedic Surgery

## 2022-04-19 ENCOUNTER — Encounter: Payer: Self-pay | Admitting: Orthopaedic Surgery

## 2022-04-19 VITALS — BP 144/90 | HR 70 | Ht 72.0 in | Wt 275.0 lb

## 2022-04-19 DIAGNOSIS — G8929 Other chronic pain: Secondary | ICD-10-CM

## 2022-04-19 DIAGNOSIS — M25511 Pain in right shoulder: Secondary | ICD-10-CM
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
## 2022-05-09 ENCOUNTER — Ambulatory Visit (INDEPENDENT_AMBULATORY_CARE_PROVIDER_SITE_OTHER): Payer: No Payment, Other | Admitting: Licensed Clinical Social Worker

## 2022-05-09 ENCOUNTER — Encounter (HOSPITAL_COMMUNITY): Payer: Self-pay

## 2022-05-09 DIAGNOSIS — F411 Generalized anxiety disorder: Secondary | ICD-10-CM | POA: Diagnosis not present

## 2022-05-09 NOTE — Progress Notes (Signed)
   THERAPIST PROGRESS NOTE  Session Time: 60 minutes  Participation Level: {BHH PARTICIPATION LEVEL:22264}  Behavioral Response: {Appearance:22683}{BHH LEVEL OF CONSCIOUSNESS:22305}{BHH MOOD:22306}  Type of Therapy: {CHL AMB BH Type of Therapy:21022741}  Treatment Goals addressed: ***  ProgressTowards Goals: {Progress Towards Goals:21014066}  Interventions: {CHL AMB BH Type of Intervention:21022753}  Summary: Ronald Young is a 52 y.o. male who presents with ***.   Suicidal/Homicidal: {BHH YES OR NO:22294}{yes/no/with/without intent/plan:22693}  Therapist Response: ***  Plan: Return again in 5 weeks.  Diagnosis: Generalized anxiety disorder  Collaboration of Care: {BH OP Collaboration of Care:21014065}  Patient/Guardian was advised Release of Information must be obtained prior to any record release in order to collaborate their care with an outside provider. Patient/Guardian was advised if they have not already done so to contact the registration department to sign all necessary forms in order for [place] to release information regarding their care.   Consent: Patient/Guardian gives verbal consent for treatment and assignment of benefits for services provided during this visit. Patient/Guardian expressed understanding and agreed to proceed.   Heron Nay, LCSWA 05/09/2022

## 2022-05-25 ENCOUNTER — Telehealth: Payer: Self-pay | Admitting: Licensed Clinical Social Worker

## 2022-05-25 NOTE — Telephone Encounter (Signed)
H&V Care Navigation CSW Progress Note  Clinical Social Worker  received a call from pt mother Ronald Young (DPR on file= [phone])  to f/u on applications sent. I provided clarification for what documents needed- I also provided [name] financial counselor's contact information so that he could assist with submitting application once completed. No additional questions/concerns at this time.   Patient is participating in a Managed Medicaid Plan:  No, self pay only at this time, working on [place]: No Food Insecurity (11/18/2020)  Housing: Low Risk  (06/16/2020)  Transportation Needs: No Transportation Needs (06/16/2020)  Alcohol Screen: Low Risk  (01/27/2021)  Depression (PHQ2-9): High Risk (09/09/2021)  Financial Resource Strain: Medium Risk (06/16/2020)  Tobacco Use: Medium Risk (04/19/2022)   [name], MSW, [place]  [phone]- work cell phone (preferred) [phone]- desk phone

## 2022-06-13 ENCOUNTER — Ambulatory Visit (HOSPITAL_COMMUNITY): Payer: No Payment, Other | Admitting: Licensed Clinical Social Worker

## 2022-07-11 ENCOUNTER — Ambulatory Visit (HOSPITAL_COMMUNITY): Payer: No Payment, Other | Admitting: Licensed Clinical Social Worker

## 2022-07-22 ENCOUNTER — Telehealth: Payer: Self-pay | Admitting: Cardiology

## 2022-07-22 NOTE — Telephone Encounter (Signed)
[facility] and they were  unable to fill pt's Entresto due to script on file expiring Mar 6 New script verbally given and pt notified reason for decline /cy

## 2022-07-22 NOTE — Telephone Encounter (Signed)
*  STAT* If patient is at the pharmacy, call can be transferred to refill team.   1. Which medications need to be refilled? (please list name of each medication and dose if known)  sacubitril-valsartan (ENTRESTO) 97-103 MG   2. Which pharmacy/location (including street and city if local pharmacy) is medication to be sent to? [facility]   3. Do they need a 30 day or 90 day supply?    Patient states "something went wrong" and [facility] is requesting that we contact them for a new prescription. Please contact patient to confirm as well.

## 2022-07-25 ENCOUNTER — Ambulatory Visit (INDEPENDENT_AMBULATORY_CARE_PROVIDER_SITE_OTHER): Payer: No Payment, Other | Admitting: Licensed Clinical Social Worker

## 2022-07-25 DIAGNOSIS — F411 Generalized anxiety disorder: Secondary | ICD-10-CM
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
## 2022-08-08 ENCOUNTER — Ambulatory Visit (INDEPENDENT_AMBULATORY_CARE_PROVIDER_SITE_OTHER): Payer: No Payment, Other | Admitting: Licensed Clinical Social Worker

## 2022-08-08 DIAGNOSIS — F411 Generalized anxiety disorder: Secondary | ICD-10-CM

## 2022-08-08 NOTE — Progress Notes (Signed)
   THERAPIST PROGRESS NOTE  Session Time: 45 minutes  Participation Level: {BHH PARTICIPATION LEVEL:22264}  Behavioral Response: {Appearance:22683}{BHH LEVEL OF CONSCIOUSNESS:22305}{BHH MOOD:22306}  Type of Therapy: {CHL AMB BH Type of Therapy:21022741}  Treatment Goals addressed: ***  ProgressTowards Goals: {Progress Towards Goals:21014066}  Interventions: {CHL AMB BH Type of Intervention:21022753}  Summary: Ronald Young is a 53 y.o. male who presents with ***.   Suicidal/Homicidal: {BHH YES OR NO:22294}{yes/no/with/without intent/plan:22693}  Therapist Response: ***  Plan: Return again in *** weeks.  Diagnosis: Generalized anxiety disorder  Collaboration of Care: {BH OP Collaboration of Care:21014065}  Patient/Guardian was advised Release of Information must be obtained prior to any record release in order to collaborate their care with an outside provider. Patient/Guardian was advised if they have not already done so to contact the registration department to sign all necessary forms in order for [place] to release information regarding their care.   Consent: Patient/Guardian gives verbal consent for treatment and assignment of benefits for services provided during this visit. Patient/Guardian expressed understanding and agreed to proceed.   Wyvonnia Lora, LCSW 08/08/2022

## 2022-08-23 ENCOUNTER — Telehealth: Payer: Self-pay

## 2022-08-23 ENCOUNTER — Telehealth: Payer: Self-pay | Admitting: Licensed Clinical Social Worker

## 2022-08-23 ENCOUNTER — Other Ambulatory Visit: Payer: Self-pay | Admitting: Pharmacist

## 2022-08-23 NOTE — Telephone Encounter (Signed)
H&V Care Navigation CSW Progress Note  Clinical Social Worker contacted patient by phone to f/u on referral for food resources. Reached out to pt at [phone] and was able to contact him today. He confirmed home address, still living with his mother. Has a new disability claim pending with the assistance of a lawyer. Never went to apply for Medicaid as recommended previously or re-applied for [employer]/[place] per account notes. I again encouraged him to apply with assistance of caseworkers and discussed that he could go before or after an appointments with Dr. Alvis Lemmings, since there are walk in hours at [facility]. LCSW will re-send information about Medicaid expansion.  Pt shares he is frustrated with "never getting an appt at [facility]," however it appears that pt had cancelled the appts on 4/27 and 5/3, not provider as noted. He had a completed appt on 09/21/2021, and never made a f/u appt for 3 months later. Encouraged him to call clinic today and arrange an appointment.   We also discussed food insecurity, he had SNAP but it was discontinued as he is not working and was not meeting the requirements - "If you are age 47-52*, able to work, and don't have any dependents, you might need to meet both the general work requirements and an additional work requirement for ABAWDs to get [employer] for more than 3 months in 3 years (the time limit)." I will send him FNS/SNAP flier from [employer] and he can reapply with their assistance. He may need to provide proof of inability to work from provider. Encourage him to ask them about how best to provide this. He is also interested in me sending the information we have about local food pantries he can utilize.   No additional questions at this time, I will reach out to him before next appt and ensure information received.   Patient is participating in a Managed Medicaid Plan:  No, self pay only.   SDOH Screenings   Food Insecurity: Food  Insecurity Present (08/23/2022)  Housing: Low Risk  (08/23/2022)  Transportation Needs: No Transportation Needs (08/23/2022)  Utilities: Not At Risk (08/23/2022)  Alcohol Screen: Low Risk  (01/27/2021)  Depression (PHQ2-9): High Risk (09/09/2021)  Financial Resource Strain: Medium Risk (08/23/2022)  Physical Activity: Insufficiently Active (08/23/2022)  Tobacco Use: Medium Risk (04/19/2022)   Ronald Young, MSW, LCSW Clinical Social Worker II [facility] Heart/Vascular Care Navigation  [phone]- work cell phone (preferred) [phone]- desk phone

## 2022-08-23 NOTE — Progress Notes (Signed)
Note: Patient is having difficulty obtaining food. He has been cut off from food stamps as he does not have a job and therefore does not meet the work requirement. Patient states he cannot work due to his medical conditions. Will look into options to connect him with resources.   Patient outreached by Wynn Maudlin, PharmD Candidate on 08/23/22 to discuss hypertension   Patient has an automated home blood pressure machine. They report home readings 135/87.    Medication review was performed. They are taking medications as prescribed.   The following barriers to adherence were noted:  - They do not have cost concerns. Patient is currently enrolled in [employer]. He stated he recently received a notice in the mail informing him he must re-enroll. He has the patient application and plans to mail it once it is completed. He did not have any questions at this time regarding re-enrollment,  - They do not have transportation concerns. Patient stated he does not have transportation, but did not state this is an issue currently.  - They do not need assistance obtaining refills.  - They do not occasionally forget to take some of their prescribed medications.  - They do not feel like one/some of their medications make them feel poorly. Patient stated that he occasionally feels dizzy. I emphasized the importance of hydration and he stated he has been trying to drink less soda and more water, which has been helping.  - They do not have questions or concerns about their medications.  - They do have follow up scheduled with their primary care provider/cardiologist.   The following interventions were completed:  - Medications were reviewed  - Patient was educated on goal blood pressures and long term health implications of elevated blood pressure  - Patient was educated on proper technique to check home blood pressure and reminded to bring home machine and readings to next provider appointment  - Patient was  educated on medications, including indication and administration  - Patient was educated on use of adherence strategies, like a pill box or alarms  - Patient was counseled on lifestyle modifications to improve blood pressure, including DASH diet, sodium restriction, and physical activity. He is currently walking 2-3 times/week and plans to "pick it up."   The patient has follow up scheduled: 11/15/2022 (Dr. Epifanio Lesches)  PCP: Dr. Merrie Roof, PharmD Candidate   Catie Eppie Gibson, PharmD, BCACP, CPP [facility] [phone]

## 2022-08-23 NOTE — Telephone Encounter (Signed)
I spoke to the patient and scheduled him with Dr Alvis Lemmings for [facility], 08/29/2022 @ 1110.  He was not sure if he would have transportation and I told him to call the clinic if he is not able to secure a ride and we can arrange a cab ride for him.  Regarding Medicaid.  He said he has been denied in the past. I explained to him that the eligibility guidelines changed 04/01/2022 and he may be eligible. He was agreeable to having me make a referral to the [facility] DSS Medicaid eligibility case workers in our building as they may be able to assist him with submitting an application. I made the referral and the DSS caseworker has already called him and scheduled him for a telephone application appointment tomorrow, 08/24/2022.

## 2022-08-24 NOTE — Telephone Encounter (Signed)
Message received from Cathlyn Parsons, [facility] caseworker,  stating that the patient has been approved for expanded Medicaid

## 2022-08-29 ENCOUNTER — Encounter: Payer: Self-pay | Admitting: Family Medicine

## 2022-08-29 ENCOUNTER — Ambulatory Visit: Payer: Medicaid Other | Attending: Family Medicine | Admitting: Family Medicine

## 2022-08-29 VITALS — BP 138/90 | HR 66 | Ht 72.0 in | Wt 270.6 lb

## 2022-08-29 DIAGNOSIS — Z1211 Encounter for screening for malignant neoplasm of colon: Secondary | ICD-10-CM | POA: Diagnosis not present

## 2022-08-29 DIAGNOSIS — Z125 Encounter for screening for malignant neoplasm of prostate: Secondary | ICD-10-CM

## 2022-08-29 DIAGNOSIS — I110 Hypertensive heart disease with heart failure: Secondary | ICD-10-CM | POA: Diagnosis not present

## 2022-08-29 DIAGNOSIS — K582 Mixed irritable bowel syndrome: Secondary | ICD-10-CM | POA: Diagnosis not present

## 2022-08-29 DIAGNOSIS — E291 Testicular hypofunction: Secondary | ICD-10-CM

## 2022-08-29 DIAGNOSIS — I5022 Chronic systolic (congestive) heart failure: Secondary | ICD-10-CM

## 2022-08-29 DIAGNOSIS — J328 Other chronic sinusitis: Secondary | ICD-10-CM | POA: Diagnosis not present

## 2022-08-29 MED ORDER — HYDRALAZINE HCL 50 MG PO TABS
100.0000 mg | ORAL_TABLET | Freq: Three times a day (TID) | ORAL | 1 refills | Status: DC
Start: 2022-08-29 — End: 2022-12-29

## 2022-08-29 MED ORDER — DAPAGLIFLOZIN PROPANEDIOL 10 MG PO TABS: 10.0000 mg | ORAL_TABLET | Freq: Every day | ORAL | 1 refills | Status: DC

## 2022-08-29 MED ORDER — POLYETHYLENE GLYCOL 3350 17 GM/SCOOP PO POWD
17.0000 g | Freq: Every day | ORAL | 1 refills | Status: AC
Start: 2022-08-29 — End: ?

## 2022-08-29 MED ORDER — DICYCLOMINE HCL 10 MG PO CAPS
10.0000 mg | ORAL_CAPSULE | Freq: Two times a day (BID) | ORAL | 3 refills | Status: AC | PRN
Start: 2022-08-29 — End: ?

## 2022-08-29 MED ORDER — SPIRONOLACTONE 25 MG PO TABS: 25.0000 mg | ORAL_TABLET | Freq: Every day | ORAL | 1 refills | Status: DC

## 2022-08-29 MED ORDER — CARVEDILOL 25 MG PO TABS: 37.5000 mg | ORAL_TABLET | Freq: Two times a day (BID) | ORAL | 1 refills | Status: DC

## 2022-08-29 MED ORDER — ISOSORBIDE MONONITRATE ER 30 MG PO TB24: 30.0000 mg | ORAL_TABLET | Freq: Every day | ORAL | 1 refills | Status: DC

## 2022-08-29 MED ORDER — ROSUVASTATIN CALCIUM 10 MG PO TABS
10.0000 mg | ORAL_TABLET | Freq: Every day | ORAL | 3 refills | Status: DC
Start: 2022-08-29 — End: 2023-08-09

## 2022-08-29 MED ORDER — ENTRESTO 97-103 MG PO TABS
1.0000 | ORAL_TABLET | Freq: Two times a day (BID) | ORAL | 1 refills | Status: DC
Start: 2022-08-29 — End: 2022-12-29

## 2022-08-29 MED ORDER — CETIRIZINE HCL 10 MG PO TABS
10.0000 mg | ORAL_TABLET | Freq: Every day | ORAL | 1 refills | Status: AC
Start: 2022-08-29 — End: ?

## 2022-08-29 NOTE — Progress Notes (Signed)
Subjective:  Patient ID: Ronald Young, male    DOB: 1970-07-30  Age: 52 y.o. MRN: [id number]  CC: Hypertension   HPI Ronald Young is a 52 y.o. year old male with a history of hypertension, mild bilateral carotid artery disease, nonischemic cardiomyopathy (EF 45 to 50% from echo of 10/2021, LV global hypokinesis, moderate LVH, grade 1 DD).   Interval History:  Last cardiology visit with the nurse practitioner was in 01/2022 and notes reviewed with no regimen changes. His cardiac cath from 2021 revealed no CAD. Denies presence of dyspnea, pedal edema, palpitations.  Today he complains of abdominal pain with constipation and diarrhea. Symptoms have been present x1 months.He has constipation x4 days then has diarrhea and this cycle repeats itself. Abdominal pain occurs right before his diarrhea. Pain is usually in lower abdomen and at other times he has nausea as well. He has had vomiting on one occasion. When he has diarrhea it occurs about 3x/day. He also has some sinus congestion.  He would like his Testosterone checked.  Past Medical History:  Diagnosis Date   Arthritis    Cardiomyopathy (HCC) 09/22/2014   Hypertension     Past Surgical History:  Procedure Laterality Date   RIGHT/LEFT HEART CATH AND CORONARY ANGIOGRAPHY N/A 12/23/2019   Procedure: RIGHT/LEFT HEART CATH AND CORONARY ANGIOGRAPHY;  Surgeon: Runell Gess, MD;  Location: [facility];  Service: Cardiovascular;  Laterality: N/A;    Family History  Problem Relation Age of Onset   Hypertension Mother    Colon cancer Other     Social History   Socioeconomic History   Marital status: Single    Spouse name: Not on file   Number of children: Not on file   Years of education: Not on file   Highest education level: Not on file  Occupational History   Not on file  Tobacco Use   Smoking status: Former    Types: Cigarettes   Smokeless tobacco: Never  Vaping Use   Vaping Use: Never used   Substance and Sexual Activity   Alcohol use: Not Currently   Drug use: Yes    Types: Marijuana   Sexual activity: Not on file  Other Topics Concern   Not on file  Social History Narrative   Not on file   Social Determinants of Health   Financial Resource Strain: Medium Risk (08/23/2022)   Overall Financial Resource Strain (CARDIA)    Difficulty of Paying Living Expenses: Somewhat hard  Food Insecurity: Food Insecurity Present (08/23/2022)   Hunger Vital Sign    Worried About Running Out of Food in the Last Year: Often true    Ran Out of Food in the Last Year: Often true  Transportation Needs: No Transportation Needs (08/23/2022)   PRAPARE - Administrator, Civil Service (Medical): No    Lack of Transportation (Non-Medical): No  Physical Activity: Insufficiently Active (08/23/2022)   Exercise Vital Sign    Days of Exercise per Week: 3 days    Minutes of Exercise per Session: 20 min  Stress: Not on file  Social Connections: Not on file    Allergies  Allergen Reactions   Strawberry Extract Hives    Outpatient Medications Prior to Visit  Medication Sig Dispense Refill   aspirin 81 MG chewable tablet Chew 1 tablet (81 mg total) by mouth daily. 30 tablet 0   cetirizine (ZYRTEC) 10 MG tablet Take 1 tablet (10 mg total) by mouth daily. 30 tablet  1   diclofenac Sodium (VOLTAREN) 1 % GEL Apply 2 g topically 4 (four) times daily. 100 g O   escitalopram (LEXAPRO) 20 MG tablet Take 1 tablet (20 mg total) by mouth daily. 30 tablet 1   fluticasone (FLONASE) 50 MCG/ACT nasal spray Place 2 sprays into both nostrils daily. 16 g 1   Melatonin 10 MG TABS Take 10 mg by mouth at bedtime. 30 tablet 1   meloxicam (MOBIC) 15 MG tablet Take 1 tablet (15 mg total) by mouth daily as needed for pain. 30 tablet 1   methocarbamol (ROBAXIN) 500 MG tablet Take 1 tablet (500 mg total) by mouth every 8 (eight) hours as needed for muscle spasms. 60 tablet 0   Multiple Vitamins-Minerals  (MULTIVITAMIN WITH MINERALS) tablet Take 1 tablet by mouth daily.     traZODone (DESYREL) 50 MG tablet Take 1 tablet (50 mg total) by mouth at bedtime. 30 tablet 1   carvedilol (COREG) 25 MG tablet Take 1.5 tablets (37.5 mg total) by mouth 2 (two) times daily with a meal. 270 tablet 3   dapagliflozin propanediol (FARXIGA) 10 MG TABS tablet Take 1 tablet (10 mg total) by mouth daily. 90 tablet 4   hydrALAZINE (APRESOLINE) 50 MG tablet Take 2 tablets (100 mg total) by mouth 3 (three) times daily. 540 tablet 3   isosorbide mononitrate (IMDUR) 30 MG 24 hr tablet Take 1 tablet (30 mg total) by mouth daily. 90 tablet 3   rosuvastatin (CRESTOR) 10 MG tablet Take 1 tablet (10 mg total) by mouth daily. 90 tablet 3   sacubitril-valsartan (ENTRESTO) 97-103 MG Take 1 tablet by mouth 2 (two) times daily. 180 tablet 3   spironolactone (ALDACTONE) 25 MG tablet Take 1 tablet (25 mg total) by mouth daily. 90 tablet 3   No facility-administered medications prior to visit.     ROS Review of Systems  Gastrointestinal:  Positive for abdominal pain, constipation and diarrhea.    Objective:  BP (!) 138/90   Pulse 66   Ht 6' (1.829 m)   Wt 270 lb 9.6 oz (122.7 kg)   SpO2 99%   BMI 36.70 kg/m      08/29/2022   11:08 AM 08/23/2022    9:57 AM 04/19/2022    4:19 PM  BP/Weight  Systolic BP 138 135 144  Diastolic BP 90 87 90  Wt. (Lbs) 270.6  275  BMI 36.7 kg/m2  37.3 kg/m2      Physical Exam Constitutional:      Appearance: He is well-developed.  Cardiovascular:     Rate and Rhythm: Normal rate.     Heart sounds: Normal heart sounds. No murmur heard. Pulmonary:     Effort: Pulmonary effort is normal.     Breath sounds: Normal breath sounds. No wheezing or rales.  Chest:     Chest wall: No tenderness.  Abdominal:     General: Bowel sounds are normal. There is no distension.     Palpations: Abdomen is soft. There is no mass.     Tenderness: There is no abdominal tenderness.  Musculoskeletal:         General: Normal range of motion.     Right lower leg: No edema.     Left lower leg: No edema.  Neurological:     Mental Status: He is alert and oriented to person, place, and time.  Psychiatric:        Mood and Affect: Mood normal.        Latest  Ref Rng & Units 11/17/2021    3:58 PM 09/20/2021    2:31 PM 08/02/2021    4:44 PM  CMP  Glucose 70 - 99 mg/dL 440  96  96   BUN 6 - 24 mg/dL 13  19  16    Creatinine 0.76 - 1.27 mg/dL 1.02  7.25  3.66   Sodium 134 - 144 mmol/L 141  141  140   Potassium 3.5 - 5.2 mmol/L 3.9  4.4  4.2   Chloride 96 - 106 mmol/L 103  106  105   CO2 20 - 29 mmol/L 21  20  23    Calcium 8.7 - 10.2 mg/dL 9.7  9.2  9.8     Lipid Panel     Component Value Date/Time   CHOL 212 (H) 01/05/2021 0949   TRIG 124 01/05/2021 0949   HDL 39 (L) 01/05/2021 0949   CHOLHDL 5.4 (H) 01/05/2021 0949   CHOLHDL 6.5 12/21/2019 0443   VLDL 13 12/21/2019 0443   LDLCALC 150 (H) 01/05/2021 0949    CBC    Component Value Date/Time   WBC 7.5 05/06/2021 1618   WBC 7.6 12/24/2019 0542   RBC 4.74 05/06/2021 1618   RBC 3.93 (L) 12/24/2019 0542   HGB 13.8 05/06/2021 1618   HCT 40.8 05/06/2021 1618   PLT 320 05/06/2021 1618   MCV 86 05/06/2021 1618   MCH 29.1 05/06/2021 1618   MCH 28.2 12/24/2019 0542   MCHC 33.8 05/06/2021 1618   MCHC 32.4 12/24/2019 0542   RDW 12.3 05/06/2021 1618   LYMPHSABS 2.2 12/20/2019 1003   MONOABS 0.5 12/20/2019 1003   EOSABS 0.2 12/20/2019 1003   BASOSABS 0.1 12/20/2019 1003    Lab Results  Component Value Date   HGBA1C 5.6 12/29/2020    Assessment & Plan:  1. Irritable bowel syndrome with both constipation and diarrhea Uncontrolled He will alternate between Bentyl and MiraLAX depending on his symptoms - dicyclomine (BENTYL) 10 MG capsule; Take 1 capsule (10 mg total) by mouth 2 (two) times daily as needed for spasms (diarrhea).  Dispense: 60 capsule; Refill: 3 - polyethylene glycol powder (GLYCOLAX/MIRALAX) 17 GM/SCOOP powder; Take  17 g by mouth daily.  Dispense: 3350 g; Refill: 1  2. Screening for prostate cancer - PSA, total and free  3. Hypogonadism in male Send of testosterone level per patient request - FSH/LH - Testosterone, Free, Total, SHBG  4. Hypertensive heart disease with chronic systolic congestive heart failure (HCC) Heart failure with moderate EF with a EF of 45 to 50% from echo of 10/2021 Continue GDMT Continue to follow-up with cardiology - isosorbide mononitrate (IMDUR) 30 MG 24 hr tablet; Take 1 tablet (30 mg total) by mouth daily.  Dispense: 90 tablet; Refill: 1 - spironolactone (ALDACTONE) 25 MG tablet; Take 1 tablet (25 mg total) by mouth daily.  Dispense: 90 tablet; Refill: 1 - LP+Non-HDL Cholesterol - CMP14+EGFR - CBC with Differential/Platelet - carvedilol (COREG) 25 MG tablet; Take 1.5 tablets (37.5 mg total) by mouth 2 (two) times daily with a meal.  Dispense: 270 tablet; Refill: 1 - dapagliflozin propanediol (FARXIGA) 10 MG TABS tablet; Take 1 tablet (10 mg total) by mouth daily.  Dispense: 90 tablet; Refill: 1 - hydrALAZINE (APRESOLINE) 50 MG tablet; Take 2 tablets (100 mg total) by mouth 3 (three) times daily.  Dispense: 540 tablet; Refill: 1 - rosuvastatin (CRESTOR) 10 MG tablet; Take 1 tablet (10 mg total) by mouth daily.  Dispense: 90 tablet; Refill: 3 - sacubitril-valsartan (ENTRESTO)  97-103 MG; Take 1 tablet by mouth 2 (two) times daily.  Dispense: 180 tablet; Refill: 1  5. Screening for colon cancer - Fecal occult blood, imunochemical   Meds ordered this encounter  Medications   isosorbide mononitrate (IMDUR) 30 MG 24 hr tablet    Sig: Take 1 tablet (30 mg total) by mouth daily.    Dispense:  90 tablet    Refill:  1   spironolactone (ALDACTONE) 25 MG tablet    Sig: Take 1 tablet (25 mg total) by mouth daily.    Dispense:  90 tablet    Refill:  1   dicyclomine (BENTYL) 10 MG capsule    Sig: Take 1 capsule (10 mg total) by mouth 2 (two) times daily as needed for spasms  (diarrhea).    Dispense:  60 capsule    Refill:  3   polyethylene glycol powder (GLYCOLAX/MIRALAX) 17 GM/SCOOP powder    Sig: Take 17 g by mouth daily.    Dispense:  3350 g    Refill:  1   carvedilol (COREG) 25 MG tablet    Sig: Take 1.5 tablets (37.5 mg total) by mouth 2 (two) times daily with a meal.    Dispense:  270 tablet    Refill:  1    Dose increase   dapagliflozin propanediol (FARXIGA) 10 MG TABS tablet    Sig: Take 1 tablet (10 mg total) by mouth daily.    Dispense:  90 tablet    Refill:  1   hydrALAZINE (APRESOLINE) 50 MG tablet    Sig: Take 2 tablets (100 mg total) by mouth 3 (three) times daily.    Dispense:  540 tablet    Refill:  1   rosuvastatin (CRESTOR) 10 MG tablet    Sig: Take 1 tablet (10 mg total) by mouth daily.    Dispense:  90 tablet    Refill:  3   sacubitril-valsartan (ENTRESTO) 97-103 MG    Sig: Take 1 tablet by mouth 2 (two) times daily.    Dispense:  180 tablet    Refill:  1    Follow-up: Return in about 6 months (around 02/28/2023) for Chronic medical conditions.       Hoy Register, MD, FAAFP. [facility] [place], [place] [phone]   08/29/2022, 11:59 AM

## 2022-08-29 NOTE — Patient Instructions (Signed)
Irritable Bowel Syndrome, Adult  Irritable bowel syndrome (IBS) is a group of symptoms that affects the organs responsible for digestion (gastrointestinal tract, or GI tract). IBS is not one specific disease. To regulate how the GI tract works, the body sends signals back and forth between the intestines and the brain. If you have IBS, there may be a problem with these signals. As a result, the GI tract does not function normally. The intestines may become more sensitive and overreact to certain things. This may be especially true when you eat certain foods or when you are under stress. There are four main types of IBS. These may be determined based on the consistency of your stool (feces): IBS with mostly (predominance of) diarrhea. IBS with predominance of constipation. IBS with mixed bowel habits. This includes both diarrhea and constipation. IBS unclassified. This includes IBS that cannot be categorized into one of the other three main types. It is important to know which type of IBS you have. Certain treatments are more likely to be helpful for certain types of IBS. What are the causes? The exact cause of IBS is not known. What increases the risk? You may have a higher risk for IBS if you: Are male. Are younger than 40 years. Have a family history of IBS. Have a mental health condition, such as depression, anxiety, or post-traumatic stress disorder. Have had a bacterial infection of your GI tract. What are the signs or symptoms? Symptoms of IBS vary from person to person. The main symptom is abdominal pain or discomfort. Other symptoms usually include one or more of the following: Diarrhea, constipation, or both. Swelling or bloating in the abdomen. Feeling full after eating a small or regular-sized meal. Frequent gas. Mucus in the stool. A feeling of having more stool left after a bowel movement. Symptoms tend to come and go. They may be triggered by stress, mental health  conditions, or certain foods. How is this diagnosed? This condition may be diagnosed based on a physical exam, your medical history, and your symptoms. You may have tests, such as: Blood tests. Stool test. Colonoscopy. This is a procedure in which your GI tract is viewed with a long, thin, flexible tube. How is this treated? There is no cure for IBS, but treatment can help relieve symptoms. Treatment depends on the type of IBS you have, and may include: Changes to your diet, such as: Avoiding foods that cause symptoms. Drinking more water. Following a low-FODMAP (fermentable oligosaccharides, disaccharides, monosaccharides, and polyols) diet for up to 6 weeks, or as told by your health care provider. FODMAPs are sugars that are hard for some people to digest. Eating more fiber. Eating small meals at the same times every day. Medicines. These may include: Fiber supplements, if you have constipation. Medicine to control diarrhea (antidiarrheal medicines). Medicine to help control muscle tightening (spasms) in your GI tract (antispasmodic medicines). Medicines to help with mental health conditions, such as antidepressants. Talk therapy or counseling. Working with a dietitian to help create a food plan that is right for you. Managing your stress. Follow these instructions at home: Eating and drinking  Eat a healthy diet. Eat 5-6 small meals a day. Try to eat meals at about the same times each day. Do not eat large meals. Gradually eat more fiber-rich foods. These include whole grains, fruits, and vegetables. This may be especially helpful if you have IBS with constipation. Eat a diet low in FODMAPs. You may need to avoid foods such as   citrus fruits, cabbage, garlic, and onions. Drink enough fluid to keep your urine pale yellow. Keep a journal of foods that seem to trigger symptoms. Avoid foods and drinks that: Contain added sugar. Make your symptoms worse. These may include dairy  products, caffeinated drinks, and carbonated drinks. Alcohol use Do not drink alcohol if: Your health care provider tells you not to drink. You are pregnant, may be pregnant, or are planning to become pregnant. If you drink alcohol: Limit how much you have to: 0-1 drink a day for women. 0-2 drinks a day for men. Know how much alcohol is in your drink. In the [place] one drink equals one 12 oz bottle of beer (355 mL), one 5 oz glass of wine (148 mL), or one 1 oz glass of hard liquor (44 mL) General instructions Take over-the-counter and prescription medicines only as told by your health care provider. This includes supplements. Get enough exercise. Do at least 150 minutes of moderate-intensity exercise each week. Manage your stress. Getting enough sleep and exercise can help you manage stress. Keep all follow-up visits. This is important. This includes all visits with your health care provider and therapist. Where to find more information International Foundation for Functional Gastrointestinal Disorders: aboutibs.org National Institute of Diabetes and Digestive and Kidney Diseases: niddk.nih.gov Contact a health care provider if: You have constant pain. You lose weight. You have diarrhea that gets worse. You have bleeding from the rectum. You vomit often. You have a fever. Get help right away if: You have severe abdominal pain. You have diarrhea with symptoms of dehydration, such as dizziness or dry mouth. You have bloody or black stools. You have severe abdominal bloating. You have vomiting that does not stop. You have blood in your vomit. Summary Irritable bowel syndrome (IBS) is not one specific disease. It is a group of symptoms that affects digestion. Your intestines may become more sensitive and overreact to certain things. This may be especially true when you eat certain foods or when you are under stress. There is no cure for IBS, but treatment can help relieve  symptoms. This information is not intended to replace advice given to you by your health care provider. Make sure you discuss any questions you have with your health care provider. Document Revised: 03/31/2021 Document Reviewed: 03/31/2021 Elsevier Patient Education  2023 Elsevier Inc.  

## 2022-08-29 NOTE — Progress Notes (Signed)
Abdominal pain with constipation and diarrhea. Sinus.

## 2022-08-30 LAB — FSH/LH: LH: 14.6 m[IU]/mL — ABNORMAL HIGH (ref 1.7–8.6)

## 2022-08-30 LAB — CMP14+EGFR
BUN: 13 mg/dL (ref 6–24)
Bilirubin Total: 0.2 mg/dL (ref 0.0–1.2)
Creatinine, Ser: 1.2 mg/dL (ref 0.76–1.27)
Potassium: 3.9 mmol/L (ref 3.5–5.2)
Total Protein: 6.9 g/dL (ref 6.0–8.5)
eGFR: 74 mL/min/{1.73_m2} (ref >59)

## 2022-08-30 LAB — PSA, TOTAL AND FREE: PSA, Free Pct: 46 %

## 2022-08-30 LAB — CBC WITH DIFFERENTIAL/PLATELET
Basophils Absolute: 0.1 10*3/uL (ref 0.0–0.2)
Eos: 3 %
Hematocrit: 42.7 % (ref 37.5–51.0)
Immature Granulocytes: 0 %
Neutrophils Absolute: 3.8 10*3/uL (ref 1.4–7.0)
Neutrophils: 57 %
WBC: 6.7 10*3/uL (ref 3.4–10.8)

## 2022-08-30 LAB — TESTOSTERONE, FREE, TOTAL, SHBG

## 2022-08-30 LAB — LP+NON-HDL CHOLESTEROL: Triglycerides: 60 mg/dL (ref 0–149)

## 2022-09-04 LAB — CMP14+EGFR
ALT: 40 IU/L (ref 0–44)
AST: 31 IU/L (ref 0–40)
Albumin/Globulin Ratio: 1.5 (ref 1.2–2.2)
Albumin: 4.1 g/dL (ref 4.1–5.1)
Alkaline Phosphatase: 80 IU/L (ref 44–121)
BUN/Creatinine Ratio: 11 (ref 9–20)
CO2: 20 mmol/L (ref 20–29)
Calcium: 9.6 mg/dL (ref 8.7–10.2)
Chloride: 106 mmol/L (ref 96–106)
Globulin, Total: 2.8 g/dL (ref 1.5–4.5)
Glucose: 94 mg/dL (ref 70–99)
Sodium: 141 mmol/L (ref 134–144)

## 2022-09-04 LAB — PSA, TOTAL AND FREE
PSA, Free: 0.23 ng/mL
Prostate Specific Ag, Serum: 0.5 ng/mL (ref 0.0–4.0)

## 2022-09-04 LAB — LP+NON-HDL CHOLESTEROL
Cholesterol, Total: 133 mg/dL (ref 100–199)
HDL: 46 mg/dL (ref >39)
LDL Chol Calc (NIH): 74 mg/dL (ref 0–99)
Total Non-HDL-Chol (LDL+VLDL): 87 mg/dL (ref 0–129)
VLDL Cholesterol Cal: 13 mg/dL (ref 5–40)

## 2022-09-04 LAB — CBC WITH DIFFERENTIAL/PLATELET
Basos: 1 %
EOS (ABSOLUTE): 0.2 10*3/uL (ref 0.0–0.4)
Hemoglobin: 14.2 g/dL (ref 13.0–17.7)
Immature Grans (Abs): 0 10*3/uL (ref 0.0–0.1)
Lymphocytes Absolute: 2 10*3/uL (ref 0.7–3.1)
Lymphs: 30 %
MCH: 29.3 pg (ref 26.6–33.0)
MCHC: 33.3 g/dL (ref 31.5–35.7)
MCV: 88 fL (ref 79–97)
Monocytes Absolute: 0.6 10*3/uL (ref 0.1–0.9)
Monocytes: 9 %
Platelets: 323 10*3/uL (ref 150–450)
RBC: 4.84 x10E6/uL (ref 4.14–5.80)
RDW: 13.5 % (ref 11.6–15.4)

## 2022-09-04 LAB — FSH/LH: FSH: 3.3 m[IU]/mL (ref 1.5–12.4)

## 2022-09-04 LAB — TESTOSTERONE, FREE, TOTAL, SHBG
Sex Hormone Binding: 33.3 nmol/L (ref 19.3–76.4)
Testosterone: 425 ng/dL (ref 264–916)

## 2022-09-05 ENCOUNTER — Ambulatory Visit (INDEPENDENT_AMBULATORY_CARE_PROVIDER_SITE_OTHER): Payer: No Payment, Other | Admitting: Licensed Clinical Social Worker

## 2022-09-05 DIAGNOSIS — F411 Generalized anxiety disorder: Secondary | ICD-10-CM | POA: Diagnosis not present
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
## 2022-09-19 ENCOUNTER — Ambulatory Visit (HOSPITAL_COMMUNITY): Payer: No Payment, Other | Admitting: Licensed Clinical Social Worker

## 2022-09-20 ENCOUNTER — Telehealth: Payer: Self-pay | Admitting: *Deleted

## 2022-09-20 NOTE — Telephone Encounter (Signed)
Received fax from [employer]-reapplication application needed.   Provider portion completed and faxed to [employer] Patient portion mailed to patient to complete.

## 2022-10-03 ENCOUNTER — Ambulatory Visit (INDEPENDENT_AMBULATORY_CARE_PROVIDER_SITE_OTHER): Payer: Medicaid Other | Admitting: Licensed Clinical Social Worker

## 2022-10-03 DIAGNOSIS — F411 Generalized anxiety disorder: Secondary | ICD-10-CM

## 2022-10-03 NOTE — Progress Notes (Signed)
   THERAPIST PROGRESS NOTE  Session Time: 60 minutes  Participation Level: {BHH PARTICIPATION LEVEL:22264}  Behavioral Response: {Appearance:22683}{BHH LEVEL OF CONSCIOUSNESS:22305}{BHH MOOD:22306}  Type of Therapy: {CHL AMB BH Type of Therapy:21022741}  Treatment Goals addressed: ***  ProgressTowards Goals: {Progress Towards Goals:21014066}  Interventions: {CHL AMB BH Type of Intervention:21022753}  Summary: Ronald Young is a 52 y.o. male who presents with ***.   Suicidal/Homicidal: {BHH YES OR NO:22294}{yes/no/with/without intent/plan:22693}  Therapist Response: ***  Plan: Return again in *** weeks.  Diagnosis: Generalized anxiety disorder  Collaboration of Care: {BH OP Collaboration of Care:21014065}  Patient/Guardian was advised Release of Information must be obtained prior to any record release in order to collaborate their care with an outside provider. Patient/Guardian was advised if they have not already done so to contact the registration department to sign all necessary forms in order for [place] to release information regarding their care.   Consent: Patient/Guardian gives verbal consent for treatment and assignment of benefits for services provided during this visit. Patient/Guardian expressed understanding and agreed to proceed.   Wyvonnia Lora, LCSW 10/03/2022

## 2022-10-17 ENCOUNTER — Ambulatory Visit (INDEPENDENT_AMBULATORY_CARE_PROVIDER_SITE_OTHER): Payer: Medicaid Other | Admitting: Licensed Clinical Social Worker

## 2022-10-17 DIAGNOSIS — F411 Generalized anxiety disorder: Secondary | ICD-10-CM | POA: Diagnosis not present
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
## 2022-11-02 ENCOUNTER — Other Ambulatory Visit (HOSPITAL_BASED_OUTPATIENT_CLINIC_OR_DEPARTMENT_OTHER): Payer: Self-pay

## 2022-11-13 NOTE — Progress Notes (Signed)
Cardiology Office Note:    Date:  11/15/2022   ID:  Ronald Young, DOB Jun 11, 1970, MRN [id number]  PCP:  Hoy Register, MD  Cardiologist:  Little Ishikawa, MD  Electrophysiologist:  None   Referring MD: Hoy Register, MD   Chief Complaint  Patient presents with   Congestive Heart Failure     History of Present Illness:    Ronald Young is a 52 y.o. male with a hx of nonischemic cardiomyopathy, mild bilateral carotid artery disease, hypertension, cocaine use who presents for follow-up.  He was admitted with decompensated heart failure and hypertensive urgency in August 2021, had been off his medications for years.  Echocardiogram 12/21/2019 showed LVEF less than 20% with global hypokinesis, moderate LVH, grade 3 diastolic dysfunction, severe RV dysfunction, mild MR, mild dilatation of the ascending aorta measuring 40 mm.  RHC/LHC on 12/23/2019 showed normal coronary arteries and filling pressures.  He was discharged on losartan 50 mg daily, carvedilol 25 mg twice daily, spironolactone 25 mg daily.  He was discharged on as needed Lasix, advised to monitor daily weights.     Repeat echocardiogram on 06/08/2020 showed significant improvement in LV systolic function (EF 45 to 50%), moderate LVH, grade 2 diastolic dysfunction, normal RV function, no significant valvular disease.  Cardiac MRI 05/10/21 showed severe asymmetric LVH measuring 20mm in basal septum (13mm in posterior wall) consistent with HCM, patchy LGE in basal septum and RV insertion site c/w HCM (LGE accounts for 13% of total myocardial mass), subendocardial LGE in apical inferior wall suggesting small infarct, LVEF 52%, RVEF 52%.  Zio x 3 days showed 1 episode of NSVT lasting 6 beats.  Echocardiogram 10/2021 showed EF 45 to 50%, moderate LVH, grade 1 diastolic dysfunction, normal RV function, no significant valvular disease.  Since last clinic visit, he reports he is doing okay.  Has been having shortness of breath recently,  states that he gets short of breath with minimal exertion.  Also reports having palpitations about once per month, last for few minutes.  Denies any chest pain, lightheadedness, syncope, or lower extremity edema.   Wt Readings from Last 3 Encounters:  11/15/22 267 lb 6.4 oz (121.3 kg)  08/29/22 270 lb 9.6 oz (122.7 kg)  04/19/22 275 lb (124.7 kg)   BP Readings from Last 3 Encounters:  11/15/22 (!) 128/90  08/29/22 (!) 138/90  08/23/22 135/87   Past Medical History:  Diagnosis Date   Arthritis    Cardiomyopathy (HCC) 09/22/2014   Hypertension     Past Surgical History:  Procedure Laterality Date   RIGHT/LEFT HEART CATH AND CORONARY ANGIOGRAPHY N/A 12/23/2019   Procedure: RIGHT/LEFT HEART CATH AND CORONARY ANGIOGRAPHY;  Surgeon: Runell Gess, MD;  Location: [facility];  Service: Cardiovascular;  Laterality: N/A;   Current Medications: Current Meds  Medication Sig   aspirin 81 MG chewable tablet Chew 1 tablet (81 mg total) by mouth daily.   carvedilol (COREG) 25 MG tablet Take 1.5 tablets (37.5 mg total) by mouth 2 (two) times daily with a meal.   cetirizine (ZYRTEC) 10 MG tablet Take 1 tablet (10 mg total) by mouth daily.   dapagliflozin propanediol (FARXIGA) 10 MG TABS tablet Take 1 tablet (10 mg total) by mouth daily.   diclofenac Sodium (VOLTAREN) 1 % GEL Apply 2 g topically 4 (four) times daily.   dicyclomine (BENTYL) 10 MG capsule Take 1 capsule (10 mg total) by mouth 2 (two) times daily as needed for spasms (diarrhea).  fluticasone (FLONASE) 50 MCG/ACT nasal spray Place 2 sprays into both nostrils daily.   hydrALAZINE (APRESOLINE) 50 MG tablet Take 2 tablets (100 mg total) by mouth 3 (three) times daily.   isosorbide mononitrate (IMDUR) 30 MG 24 hr tablet Take 1 tablet (30 mg total) by mouth daily.   Melatonin 10 MG TABS Take 10 mg by mouth at bedtime.   meloxicam (MOBIC) 15 MG tablet Take 1 tablet (15 mg total) by mouth daily as needed for pain.   methocarbamol  (ROBAXIN) 500 MG tablet Take 1 tablet (500 mg total) by mouth every 8 (eight) hours as needed for muscle spasms.   Multiple Vitamins-Minerals (MULTIVITAMIN WITH MINERALS) tablet Take 1 tablet by mouth daily.   polyethylene glycol powder (GLYCOLAX/MIRALAX) 17 GM/SCOOP powder Take 17 g by mouth daily.   rosuvastatin (CRESTOR) 10 MG tablet Take 1 tablet (10 mg total) by mouth daily.   sacubitril-valsartan (ENTRESTO) 97-103 MG Take 1 tablet by mouth 2 (two) times daily.   spironolactone (ALDACTONE) 25 MG tablet Take 1 tablet (25 mg total) by mouth daily.   traZODone (DESYREL) 50 MG tablet Take 1 tablet (50 mg total) by mouth at bedtime.     Allergies:   Strawberry extract   Social History   Socioeconomic History   Marital status: Single    Spouse name: Not on file   Number of children: Not on file   Years of education: Not on file   Highest education level: Not on file  Occupational History   Not on file  Tobacco Use   Smoking status: Former    Types: Cigarettes   Smokeless tobacco: Never  Vaping Use   Vaping status: Never Used  Substance and Sexual Activity   Alcohol use: Not Currently   Drug use: Yes    Types: Marijuana   Sexual activity: Not on file  Other Topics Concern   Not on file  Social History Narrative   Not on file   Social Determinants of Health   Financial Resource Strain: Medium Risk (08/23/2022)   Overall Financial Resource Strain (CARDIA)    Difficulty of Paying Living Expenses: Somewhat hard  Food Insecurity: Food Insecurity Present (08/23/2022)   Hunger Vital Sign    Worried About Running Out of Food in the Last Year: Often true    Ran Out of Food in the Last Year: Often true  Transportation Needs: No Transportation Needs (08/23/2022)   PRAPARE - Administrator, Civil Service (Medical): No    Lack of Transportation (Non-Medical): No  Physical Activity: Insufficiently Active (08/23/2022)   Exercise Vital Sign    Days of Exercise per Week: 3  days    Minutes of Exercise per Session: 20 min  Stress: Not on file  Social Connections: Not on file     Family History: [name] family history includes Colon cancer in an other family member; Hypertension in his mother.  ROS:   Please see the history of present illness.      All other systems reviewed and are negative.  EKGs/Labs/Other Studies Reviewed:    The following studies were reviewed today: ECHO 06/08/2020:   IMPRESSIONS    1. Left ventricular ejection fraction, by estimation, is 45 to 50%. The  left ventricle has mildly decreased function. The left ventricle  demonstrates global hypokinesis. There is moderate concentric left  ventricular hypertrophy. Left ventricular  diastolic parameters are consistent with Grade II diastolic dysfunction  (pseudonormalization). Elevated left atrial pressure.   2.  Right ventricular systolic function is normal. The right ventricular  size is normal. Tricuspid regurgitation signal is inadequate for assessing  PA pressure.   3. Left atrial size was mild to moderately dilated.   4. The mitral valve is grossly normal. Trivial mitral valve  regurgitation. No evidence of mitral stenosis.   5. The aortic valve is tricuspid. Aortic valve regurgitation is not  visualized. No aortic stenosis is present.   6. The inferior vena cava is normal in size with greater than 50%  respiratory variability, suggesting right atrial pressure of 3 mmHg.   R/L HEART CATH 12/23/2019 IMPRESSION: Mr. Ryans has normal coronary arteries, elevated LVEDP and fairly normal filling pressures. He has a nonischemic cardiomyopathy probably hypertensive in nature from failure to take his prescribed medications. The antecubital venous sheath was removed and pressure held. The radial sheath was removed and a TR band was placed on the right wrist to achieve patent hemostasis. The patient left lab in stable condition. He'll be transferred back to [facility] long hospital  once the TR band is off and resume guideline directed optimal medical treatment. He left the lab in stable condition.  ECHO 12/21/19  IMPRESSIONS    1. Left ventricular ejection fraction, by estimation, is <20%. The left  ventricle has severely decreased function. The left ventricle demonstrates  global hypokinesis. The left ventricular internal cavity size was  moderately dilated. There is moderate  concentric left ventricular hypertrophy. Left ventricular diastolic  parameters are consistent with Grade III diastolic dysfunction  (restrictive).   2. Right ventricular systolic function is severely reduced. The right  ventricular size is moderately enlarged.   3. Left atrial size was severely dilated.   4. Right atrial size was mildly dilated.   5. The mitral valve is normal in structure. Mild mitral valve  regurgitation. No evidence of mitral stenosis.   6. The aortic valve is normal in structure. Aortic valve regurgitation is  not visualized. No aortic stenosis is present.   7. There is mild dilatation of the ascending aorta measuring 40 mm.   8. The inferior vena cava is normal in size with greater than 50%  respiratory variability, suggesting right atrial pressure of 3 mmHg.   EKG:   11/15/2022: Sinus bradycardia, first-degree AV block, Q waves in V1-3, rate 50 11/11/2021: Sinus rhythm, first-degree AV block, rate 64, PVC, Q waves in V1-4 and 3, aVF 03/30/21: Normal sinus rhythm, rate 67, Q-wave in V1-4 2/22- NSR with first degree AV block, rate 74, TWI in I, II, aVL, V4-6  Recent Labs: 08/29/2022: ALT 40; BUN 13; Creatinine, Ser 1.20; Hemoglobin 14.2; Platelets 323; Potassium 3.9; Sodium 141  Recent Lipid Panel    Component Value Date/Time   CHOL 133 08/29/2022 1148   TRIG 60 08/29/2022 1148   HDL 46 08/29/2022 1148   CHOLHDL 5.4 (H) 01/05/2021 0949   CHOLHDL 6.5 12/21/2019 0443   VLDL 13 12/21/2019 0443   LDLCALC 74 08/29/2022 1148    Physical Exam:    VS:  BP (!)  128/90 (BP Location: Right Arm, Patient Position: Sitting, Cuff Size: Large)   Pulse (!) 55   Ht 6' (1.829 m)   Wt 267 lb 6.4 oz (121.3 kg)   SpO2 98%   BMI 36.27 kg/m     Wt Readings from Last 3 Encounters:  11/15/22 267 lb 6.4 oz (121.3 kg)  08/29/22 270 lb 9.6 oz (122.7 kg)  04/19/22 275 lb (124.7 kg)     GEN: Well nourished,  well developed in no acute distress HEENT: Normal NECK: No JVD; No carotid bruits CARDIAC: RRR, no murmurs, rubs, gallops RESPIRATORY:  Clear to auscultation without rales, wheezing or rhonchi  ABDOMEN: Soft, non-tender, non-distended MUSCULOSKELETAL:  No edema; No deformity  SKIN: Warm and dry NEUROLOGIC:  Alert and oriented x 3 PSYCHIATRIC:  Normal affect   ASSESSMENT:    1. Chronic combined systolic and diastolic heart failure (HCC)   2. Hypertrophic cardiomyopathy (HCC)   3. Essential hypertension   4. Hyperlipidemia, unspecified hyperlipidemia type      PLAN:    Chronic combined systolic and diastolic heart failure: Echocardiogram 12/21/2019 showed LVEF less than 20% with global hypokinesis, moderate LVH, grade 3 diastolic dysfunction, severe RV dysfunction, mild MR, mild dilatation of the ascending aorta measuring 40 mm.  RHC/LHC on 12/23/2019 showed normal coronary arteries and filling pressures.  Nonischemic cardiomyopathy, suspect due to uncontrolled hypertension.  Repeat echocardiogram on 06/08/2020 showed significant improvement in LV systolic function (EF 45 to 50%), moderate LVH, grade 2 diastolic dysfunction, normal RV function, no significant valvular disease.  Cardiac MRI 05/10/21 showed severe asymmetric LVH measuring 20mm in basal septum (13mm in posterior wall) consistent with HCM, patchy LGE in basal septum and RV insertion site c/w HCM (LGE accounts for 13% of total myocardial mass), subendocardial LGE in apical inferior wall suggesting small infarct, LVEF 52%, RVEF 52%.  Echocardiogram 10/2021 showed EF 45 to 50%, moderate LVH, grade 1  diastolic dysfunction, normal RV function, no significant valvular disease. -Continue Entresto 97-103 mg twice daily.  Check BMET -Continue carvedilol 37.5 mg twice daily -Continue spironolactone 25 mg daily -Continue hydralazine 100 mg 3 times daily and Imdur 30 mg daily -Continue Farxiga 10 mg daily -Reports worsening dyspnea, will update echocardiogram  HCM: Cardiac MRI 05/10/21 showed severe asymmetric LVH measuring 20mm in basal septum (13mm in posterior wall) consistent with HCM, patchy LGE in basal septum and RV insertion site c/w HCM (LGE accounts for 13% of total myocardial mass), subendocardial LGE in apical inferior wall suggesting small infarct, LVEF 52%, RVEF 52%.  Zio x 3 days showed 1 episode of NSVT lasting 6 beats; d/w EP, stated that NSVT lasting less than 7 beats in HCM is not concerning, no other work-up recommended at this time.  He has 2 children, twins age 24, recommend that they be screened with echocardiogram  Hypertension: Continue carvedilol, spironolactone, hydralazine/Imdur, Sherryll Burger.     Cocaine use: Denies recent use.  Encouraged continued cessation  Marijuana use: Denies recent use.  Encouraged continued cessation  GERD: Continue famotidine  Snoring: negative sleep study 05/16/21  Hyperlipidemia: LDL 150 12/2020.  10-year ASCVD risk score 10.5%.  Started on atorvastatin but reports did not tolerate due to palpitations when taking.  He was switched to rosuvastatin 10 mg daily, which he is tolerating  He reports that he was recently taken off food stamps, having difficulty affording food without food stamps.  Will ask our social worker to reach out to him about available resources  RTC in 4 months   Medication Adjustments/Labs and Tests Ordered: Current medicines are reviewed at length with the patient today.  Concerns regarding medicines are outlined above.  Orders Placed This Encounter  Procedures   EKG 12-Lead     No orders of the defined types were  placed in this encounter.    There are no Patient Instructions on file for this visit.    Signed, Little Ishikawa, MD  11/15/2022 4:15 PM    [facility]

## 2022-11-15 ENCOUNTER — Encounter: Payer: Self-pay | Admitting: Cardiology

## 2022-11-15 ENCOUNTER — Ambulatory Visit: Payer: Medicaid Other | Attending: Cardiology | Admitting: Cardiology

## 2022-11-15 VITALS — BP 128/90 | HR 55 | Ht 72.0 in | Wt 267.4 lb

## 2022-11-15 DIAGNOSIS — I10 Essential (primary) hypertension: Secondary | ICD-10-CM | POA: Diagnosis not present

## 2022-11-15 DIAGNOSIS — I5042 Chronic combined systolic (congestive) and diastolic (congestive) heart failure: Secondary | ICD-10-CM

## 2022-11-15 DIAGNOSIS — E785 Hyperlipidemia, unspecified: Secondary | ICD-10-CM | POA: Diagnosis not present

## 2022-11-15 DIAGNOSIS — I422 Other hypertrophic cardiomyopathy: Secondary | ICD-10-CM

## 2022-11-15 NOTE — Patient Instructions (Signed)
Medication Instructions:   No changes  *If you need a refill on your cardiac medications before your next appointment, please call your pharmacy*   Lab Work:  Bmp magnesium  If you have labs (blood work) drawn today and your tests are completely normal, you will receive your results only by: MyChart Message (if you have MyChart) OR A paper copy in the mail If you have any lab test that is abnormal or we need to change your treatment, we will call you to review the results.   Testing/Procedures:  [address] Your physician has requested that you have an echocardiogram. Echocardiography is a painless test that uses sound waves to create images of your heart. It provides your doctor with information about the size and shape of your heart and how well your heart's chambers and valves are working. This procedure takes approximately one hour. There are no restrictions for this procedure. Please do NOT wear cologne, perfume, aftershave, or lotions (deodorant is allowed). Please arrive 15 minutes prior to your appointment time.    Follow-Up: At [facility], you and your health needs are our priority.  As part of our continuing mission to provide you with exceptional heart care, we have created designated Provider Care Teams.  These Care Teams include your primary Cardiologist (physician) and Advanced Practice Providers (APPs -  Physician Assistants and Nurse Practitioners) who all work together to provide you with the care you need, when you need it.  We recommend signing up for the patient portal called "MyChart".  Sign up information is provided on this After Visit Summary.  MyChart is used to connect with patients for Virtual Visits (Telemedicine).  Patients are able to view lab/test results, encounter notes, upcoming appointments, etc.  Non-urgent messages can be sent to your provider as well.   To learn more about what you can do with MyChart, go to  [email or web address].    Your next appointment:   4 month(s)  The format for your next appointment:   In Person  Provider:   Little Ishikawa, MD    Other Instructions  You have been referred to  soicla worker to assistance community resources

## 2022-11-16 ENCOUNTER — Encounter: Payer: Self-pay | Admitting: *Deleted

## 2022-11-16 LAB — BASIC METABOLIC PANEL
BUN/Creatinine Ratio: 13 (ref 9–20)
BUN: 15 mg/dL (ref 6–24)
CO2: 23 mmol/L (ref 20–29)
Calcium: 9.9 mg/dL (ref 8.7–10.2)
Chloride: 104 mmol/L (ref 96–106)
Creatinine, Ser: 1.18 mg/dL (ref 0.76–1.27)
Glucose: 96 mg/dL (ref 70–99)
Potassium: 4.7 mmol/L (ref 3.5–5.2)
Sodium: 142 mmol/L (ref 134–144)
eGFR: 75 mL/min/{1.73_m2} (ref >59)

## 2022-11-16 LAB — MAGNESIUM: Magnesium: 2.3 mg/dL (ref 1.6–2.3)

## 2022-11-17 ENCOUNTER — Telehealth (HOSPITAL_BASED_OUTPATIENT_CLINIC_OR_DEPARTMENT_OTHER): Payer: Self-pay | Admitting: Licensed Clinical Social Worker

## 2022-11-17 NOTE — Telephone Encounter (Addendum)
H&V Care Navigation CSW Progress Note  Clinical Social Worker contacted patient by phone to f/u on request regarding SNAP. Pt did not answer today at [phone]. I left voicemail requesting return call.   Previously had discussed this in April with pt. Unfortunately the criteria for SNAP is as follows:"If you are age 51-52*, able to work, and don't have any dependents, you might need to meet both the general work requirements and an additional work requirement for Able-Bodied Adult Without Dependents (ABAWD) to get SNAP for more than 3 months in 3 years (the time limit)." Per RN, MD is not willing to write pt letter at this time. I will f/u with RNCM at PCP office to see if she will complete this however without letter we can only connect pt to pantry/community nutrition resources at this time.   Patient is participating in a Managed Medicaid Plan:  Yes  SDOH Screenings   Food Insecurity: Food Insecurity Present (08/23/2022)  Housing: Low Risk  (08/23/2022)  Transportation Needs: No Transportation Needs (08/23/2022)  Utilities: Not At Risk (08/23/2022)  Alcohol Screen: Low Risk  (01/27/2021)  Depression (PHQ2-9): High Risk (08/29/2022)  Financial Resource Strain: Medium Risk (08/23/2022)  Physical Activity: Insufficiently Active (08/23/2022)  Tobacco Use: Medium Risk (11/15/2022)    [name], MSW, LCSW Clinical Social Worker II [facility] Heart/Vascular Care Navigation  [phone]- work cell phone (preferred) [phone]- desk phone

## 2022-11-17 NOTE — Addendum Note (Signed)
Addended by: Tobin Chad on: 11/17/2022 02:54 PM   Modules accepted: Orders

## 2022-11-17 NOTE — Telephone Encounter (Addendum)
H&V Care Navigation CSW Progress Note  Clinical Social Worker  received call back from pt to  to f/u on food stamps inquiry. We discussed again requirements for food stamps. He understands Dr. Bjorn Pippin doesn't feel he can write a note related to cardiac concerns keeping him out of work. I will route this to [facility] to see if pt PCP would be willing to assist. LCSW will send pantry resources to pt as well. No additional questions at this time.   Patient is participating in a Managed Medicaid Plan:  Yes  SDOH Screenings   Food Insecurity: Food Insecurity Present (08/23/2022)  Housing: Low Risk  (08/23/2022)  Transportation Needs: No Transportation Needs (08/23/2022)  Utilities: Not At Risk (08/23/2022)  Alcohol Screen: Low Risk  (01/27/2021)  Depression (PHQ2-9): High Risk (08/29/2022)  Financial Resource Strain: Medium Risk (08/23/2022)  Physical Activity: Insufficiently Active (08/23/2022)  Tobacco Use: Medium Risk (11/15/2022)   [name], MSW, LCSW Clinical Social Worker II [facility] Heart/Vascular Care Navigation  [phone]- work cell phone (preferred) [phone]- desk phone

## 2022-11-21 ENCOUNTER — Ambulatory Visit (INDEPENDENT_AMBULATORY_CARE_PROVIDER_SITE_OTHER): Payer: Medicaid Other | Admitting: Licensed Clinical Social Worker

## 2022-11-21 DIAGNOSIS — F411 Generalized anxiety disorder: Secondary | ICD-10-CM | POA: Diagnosis not present

## 2022-11-21 NOTE — Progress Notes (Signed)
   THERAPIST PROGRESS NOTE  Session Time: 55 minutes  Participation Level: Active  Behavioral Response: CasualAlertEuthymic  Type of Therapy: Individual Therapy  Treatment Goals addressed: anxiety  ProgressTowards Goals: Progressing  Interventions: CBT  Summary: Ronald Young is a 52 y.o. male who presents for f/u with this cln. He arrives on time and maintains appropriate eye contact throughout the session. He reports he has been "pretty good" and his anxiety has been "okay. About 60%." He reports he continues to feel anxious while awaiting disability and the settlement from the [facility] and discusses frustrations related to that. He shares that seeing his ex, D, helped him let go of some of his anger. He reports she looked really bad, which made him laugh. He shares how her family has continued to look down on him and how he is certain that influences how his daughters see him. He states he has not talked to either of them since the recital, stating he is too afraid of the rejection. He expresses he wants to be closer to them, but he thinks they should make the first move, and if they were to reject him, the door would be closed forever. He states he continues to believe his daughter S is on her mother's side no matter what and reiterates that his daughter T would likely be open to working on their relationship, yet he is still fearful of rejection which prevents him from making the first move. He is receptive to feedback.  Suicidal/Homicidal: Nowithout intent/plan  Therapist Response: Cln assessed for current stressors, symptoms, and safety since last session. Cln utilized active listening and validation to help pt feel heard and understood, express thoughts and feelings, and gain insight into their experiences. Cln explored pt's fear surrounding initiating contact with his daughters and suggested he should be the one to make the first move, as he is the parent. Cln pointed out that avoiding  rejection by doing nothing will guarantee things remain the same and asked pt if he was okay with that, to which pt said he is not. Cln scheduled follow-up appointment and confirmed pt's availability and preferred method of service delivery (in-person).  Plan: Return again in 3 weeks.  Diagnosis: Generalized anxiety disorder  Collaboration of Care: Other none required for this visit  Patient/Guardian was advised Release of Information must be obtained prior to any record release in order to collaborate their care with an outside provider. Patient/Guardian was advised if they have not already done so to contact the registration department to sign all necessary forms in order for [place] to release information regarding their care.   Consent: Patient/Guardian gives verbal consent for treatment and assignment of benefits for services provided during this visit. Patient/Guardian expressed understanding and agreed to proceed.   Wyvonnia Lora, LCSW 11/21/2022

## 2022-11-23 ENCOUNTER — Telehealth: Payer: Self-pay

## 2022-11-23 NOTE — Telephone Encounter (Signed)
Dr Alvis Lemmings,  I received a message from [name], LCSW with [facility].    She said that the patient does not have food stamps and he is requesting a letter from his provider stating he cannot work so he can qualify for food stamps.  She said that the patient  understands Dr. Bjorn Pippin doesn't feel he can write a note related to cardiac concerns keeping him out of work. So they are checking with you as PCP.    Please advise.

## 2022-11-23 NOTE — Telephone Encounter (Signed)
I will ask Dr Alvis Lemmings and let you know.

## 2022-11-23 NOTE — Telephone Encounter (Signed)
If cardiology does not feel his cardiac conditions are a limitation to him working then I have nothing additional to add and unfortunately cannot provide that letter. Thanks

## 2022-11-24 ENCOUNTER — Telehealth: Payer: Self-pay | Admitting: Licensed Clinical Social Worker

## 2022-11-24 NOTE — Telephone Encounter (Signed)
H&V Care Navigation CSW Progress Note  Clinical Social Worker contacted patient by phone to f/u on request for provider letter attesting to medical care keeping them out of work. LCSW is aware neither Dr. Alvis Lemmings or Dr. Bjorn Pippin are able to attest to pt being out of work due to cardiac needs at this time. LCSW was able to reach pt this morning at [phone]. We discussed the above and speaking with caseworker regarding alternate means of attestation. I have sent food resources for the community and encouraged him to contact me as needed for any additional questions/concerns.  Patient is participating in a Managed Medicaid Plan:  YesKoren Shiver   SDOH Screenings   Food Insecurity: Food Insecurity Present (08/23/2022)  Housing: Low Risk  (08/23/2022)  Transportation Needs: No Transportation Needs (08/23/2022)  Utilities: Not At Risk (08/23/2022)  Alcohol Screen: Low Risk  (01/27/2021)  Depression (PHQ2-9): High Risk (08/29/2022)  Financial Resource Strain: Medium Risk (08/23/2022)  Physical Activity: Insufficiently Active (08/23/2022)  Tobacco Use: Medium Risk (11/15/2022)    [name], MSW, LCSW Clinical Social Worker II [facility] Heart/Vascular Care Navigation  [phone]- work cell phone (preferred) [phone]- desk phone

## 2022-12-09 ENCOUNTER — Ambulatory Visit (HOSPITAL_COMMUNITY): Payer: Medicaid Other | Attending: Cardiology

## 2022-12-09 DIAGNOSIS — I422 Other hypertrophic cardiomyopathy: Secondary | ICD-10-CM | POA: Insufficient documentation

## 2022-12-09 DIAGNOSIS — I5042 Chronic combined systolic (congestive) and diastolic (congestive) heart failure: Secondary | ICD-10-CM | POA: Insufficient documentation

## 2022-12-09 LAB — ECHOCARDIOGRAM COMPLETE
Area-P 1/2: 3.2 cm2
S' Lateral: 3.4 cm

## 2022-12-09 MED ORDER — PERFLUTREN LIPID MICROSPHERE
3.0000 mL | INTRAVENOUS | Status: AC | PRN
Start: 2022-12-09 — End: 2022-12-09
  Administered 2022-12-09: 3 mL via INTRAVENOUS

## 2022-12-12 ENCOUNTER — Ambulatory Visit (INDEPENDENT_AMBULATORY_CARE_PROVIDER_SITE_OTHER): Payer: Medicaid Other | Admitting: Licensed Clinical Social Worker

## 2022-12-12 DIAGNOSIS — F411 Generalized anxiety disorder: Secondary | ICD-10-CM | POA: Diagnosis not present
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
## 2022-12-26 ENCOUNTER — Ambulatory Visit (HOSPITAL_COMMUNITY): Payer: No Payment, Other | Admitting: Licensed Clinical Social Worker

## 2022-12-29 ENCOUNTER — Telehealth: Payer: Self-pay | Admitting: Cardiology

## 2022-12-29 DIAGNOSIS — I110 Hypertensive heart disease with heart failure: Secondary | ICD-10-CM

## 2022-12-29 MED ORDER — ENTRESTO 97-103 MG PO TABS: 1.0000 | ORAL_TABLET | Freq: Two times a day (BID) | ORAL | 3 refills | Status: DC

## 2022-12-29 MED ORDER — HYDRALAZINE HCL 50 MG PO TABS: 100.0000 mg | ORAL_TABLET | Freq: Three times a day (TID) | ORAL | 3 refills | Status: DC

## 2022-12-29 MED ORDER — SPIRONOLACTONE 25 MG PO TABS: 25.0000 mg | ORAL_TABLET | Freq: Every day | ORAL | 3 refills | Status: DC

## 2022-12-29 MED ORDER — ISOSORBIDE MONONITRATE ER 30 MG PO TB24
30.0000 mg | ORAL_TABLET | Freq: Every day | ORAL | 3 refills | Status: DC
Start: 2022-12-29 — End: 2023-08-09

## 2022-12-29 NOTE — Telephone Encounter (Signed)
*  STAT* If patient is at the pharmacy, call can be transferred to refill team.   1. Which medications need to be refilled? (please list name of each medication and dose if known)   hydrALAZINE (APRESOLINE) 50 MG tablet (still has some left) sacubitril-valsartan (ENTRESTO) 97-103 MG  (completely out) spironolactone (ALDACTONE) 25 MG tablet (completely out) isosorbide mononitrate (IMDUR) 30 MG 24 hr tablet (has some left)  2. Would you like to learn more about the convenience, safety, & potential cost savings by using the [facility]?   3. Are you open to using the [facility] (Type [facility]. ).  4. Which pharmacy/location (including street and city if local pharmacy) is medication to be sent to?  [facility] - [place] ([place]), [place] - [address]   5. Do they need a 30 day or 90 day supply?   90 days  Patient stated is completely out/almost out of these medications.  Patient has appointment scheduled on 11/26.  Patient also requested application for assistance to get [place].

## 2022-12-29 NOTE — Telephone Encounter (Signed)
Pt is requesting a patient assistance application for farxiga and Entresto. Please address Pt's medications were sent to pt's pharmacy as requested. Confirmation received.

## 2022-12-30 MED ORDER — ENTRESTO 97-103 MG PO TABS
1.0000 | ORAL_TABLET | Freq: Two times a day (BID) | ORAL | 3 refills | Status: DC
Start: 2022-12-30 — End: 2023-12-08

## 2022-12-30 MED ORDER — DAPAGLIFLOZIN PROPANEDIOL 10 MG PO TABS: 10.0000 mg | ORAL_TABLET | Freq: Every day | ORAL | 3 refills | Status: DC

## 2022-12-30 NOTE — Telephone Encounter (Signed)
Spoke to patient stated he has medicaid now and he would like [facility].He will call make if medicaid does not cover.

## 2022-12-30 NOTE — Telephone Encounter (Signed)
**Note De-Identified Kaz Auld Obfuscation** The pt sees Dr Bjorn Pippin at our [facility] office. Forwarding to Dr Campbell Lerner nurse so she can f/u with the pt concerning applying for patient assistance for  [place].

## 2023-01-03 ENCOUNTER — Telehealth: Payer: Self-pay | Admitting: Pharmacy Technician

## 2023-01-03 ENCOUNTER — Other Ambulatory Visit (HOSPITAL_COMMUNITY): Payer: Self-pay

## 2023-01-03 NOTE — Telephone Encounter (Signed)
Pharmacy Patient Advocate Encounter   Received notification from CoverMyMeds that prior authorization for Dapagliflozin Propanediol 10MG  tablets is required/requested.   Insurance verification completed.   The patient is insured through [facility] .   Per test claim: The current 01/03/23 day co-pay is, $4.00.  No PA needed at this time. This test claim was processed through [facility]- copay amounts may vary at other pharmacies due to pharmacy/plan contracts, or as the patient moves through the different stages of their insurance plan.

## 2023-01-16 ENCOUNTER — Ambulatory Visit (INDEPENDENT_AMBULATORY_CARE_PROVIDER_SITE_OTHER): Payer: Medicaid Other | Admitting: Licensed Clinical Social Worker

## 2023-01-16 DIAGNOSIS — F411 Generalized anxiety disorder: Secondary | ICD-10-CM

## 2023-01-17 ENCOUNTER — Other Ambulatory Visit (HOSPITAL_COMMUNITY): Payer: Self-pay
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
## 2023-02-13 ENCOUNTER — Ambulatory Visit (INDEPENDENT_AMBULATORY_CARE_PROVIDER_SITE_OTHER): Payer: Medicaid Other | Admitting: Licensed Clinical Social Worker

## 2023-02-13 DIAGNOSIS — F411 Generalized anxiety disorder: Secondary | ICD-10-CM | POA: Diagnosis not present
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
## 2023-02-17 ENCOUNTER — Telehealth: Payer: Self-pay | Admitting: Cardiology

## 2023-02-17 ENCOUNTER — Other Ambulatory Visit (HOSPITAL_COMMUNITY): Payer: Self-pay

## 2023-02-17 DIAGNOSIS — I110 Hypertensive heart disease with heart failure: Secondary | ICD-10-CM

## 2023-02-17 MED ORDER — DAPAGLIFLOZIN PROPANEDIOL 10 MG PO TABS: 10.0000 mg | ORAL_TABLET | Freq: Every day | ORAL | 2 refills | Status: DC

## 2023-02-17 NOTE — Telephone Encounter (Signed)
*  STAT* If patient is at the pharmacy, call can be transferred to refill team.   1. Which medications need to be refilled? (please list name of each medication and dose if known) dapagliflozin propanediol (FARXIGA) 10 MG TABS tablet    2. Would you like to learn more about the convenience, safety, & potential cost savings by using the [facility]? No     3. Are you open to using the [facility] (Type [facility]. No ).   4. Which pharmacy/location (including street and city if local pharmacy) is medication to be sent to? [facility] - [place] ([place]), [place] - [address]    5. Do they need a 30 day or 90 day supply? 90

## 2023-02-17 NOTE — Telephone Encounter (Signed)
Pt's medication was sent to pt's pharmacy as requested. Confirmation received.  °

## 2023-02-20 ENCOUNTER — Ambulatory Visit (INDEPENDENT_AMBULATORY_CARE_PROVIDER_SITE_OTHER): Payer: Medicaid Other | Admitting: Licensed Clinical Social Worker

## 2023-02-20 DIAGNOSIS — F411 Generalized anxiety disorder: Secondary | ICD-10-CM
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
## 2023-03-06 ENCOUNTER — Telehealth: Payer: Self-pay | Admitting: Cardiology

## 2023-03-06 ENCOUNTER — Ambulatory Visit (INDEPENDENT_AMBULATORY_CARE_PROVIDER_SITE_OTHER): Payer: Medicaid Other | Admitting: Licensed Clinical Social Worker

## 2023-03-06 DIAGNOSIS — I5022 Chronic systolic (congestive) heart failure: Secondary | ICD-10-CM

## 2023-03-06 DIAGNOSIS — F411 Generalized anxiety disorder: Secondary | ICD-10-CM

## 2023-03-06 NOTE — Telephone Encounter (Signed)
Pt c/o medication issue:  1. Name of Medication: dapagliflozin propanediol (FARXIGA) 10 MG TABS tablet   2. How are you currently taking this medication (dosage and times per day)?    3. Are you having a reaction (difficulty breathing--STAT)? no  4. What is your medication issue? Prior Berkley Harvey is needed
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
## 2023-03-07 ENCOUNTER — Other Ambulatory Visit (HOSPITAL_COMMUNITY): Payer: Self-pay

## 2023-03-07 ENCOUNTER — Telehealth: Payer: Self-pay | Admitting: Pharmacy Technician

## 2023-03-07 NOTE — Telephone Encounter (Signed)
Pharmacy Patient Advocate Encounter   Received notification from CoverMyMeds that prior authorization for farxiga is required/requested.   Insurance verification completed.   The patient is insured through [facility] .   Per test claim: PA required; PA submitted to above mentioned insurance via CoverMyMeds Key/confirmation #/EOC [id number] Status is pending

## 2023-03-08 NOTE — Telephone Encounter (Signed)
Pharmacy Patient Advocate Encounter  Received notification from [facility] that Prior Authorization for farxiga has been APPROVED from 03/07/23 to 03/06/24   PA #/Case ID/Reference #: [id number]

## 2023-03-20 ENCOUNTER — Ambulatory Visit (INDEPENDENT_AMBULATORY_CARE_PROVIDER_SITE_OTHER): Payer: Medicaid Other | Admitting: Licensed Clinical Social Worker

## 2023-03-20 DIAGNOSIS — F411 Generalized anxiety disorder: Secondary | ICD-10-CM | POA: Diagnosis not present
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
## 2023-03-26 NOTE — Progress Notes (Unsigned)
Cardiology Office Note:    Date:  03/28/2023   ID:  Ronald Young, DOB Dec 07, 1970, MRN [id number]  PCP:  Hoy Register, MD  Cardiologist:  Little Ishikawa, MD  Electrophysiologist:  None   Referring MD: Hoy Register, MD   Chief Complaint  Patient presents with   Congestive Heart Failure     History of Present Illness:    Ronald Young is a 52 y.o. male with a hx of nonischemic cardiomyopathy, mild bilateral carotid artery disease, hypertension, cocaine use who presents for follow-up.  He was admitted with decompensated heart failure and hypertensive urgency in August 2021, had been off his medications for years.  Echocardiogram 12/21/2019 showed LVEF less than 20% with global hypokinesis, moderate LVH, grade 3 diastolic dysfunction, severe RV dysfunction, mild MR, mild dilatation of the ascending aorta measuring 40 mm.  RHC/LHC on 12/23/2019 showed normal coronary arteries and filling pressures.  He was discharged on losartan 50 mg daily, carvedilol 25 mg twice daily, spironolactone 25 mg daily.  He was discharged on as needed Lasix, advised to monitor daily weights.     Repeat echocardiogram on 06/08/2020 showed significant improvement in LV systolic function (EF 45 to 50%), moderate LVH, grade 2 diastolic dysfunction, normal RV function, no significant valvular disease.  Cardiac MRI 05/10/21 showed severe asymmetric LVH measuring 20mm in basal septum (13mm in posterior wall) consistent with HCM, patchy LGE in basal septum and RV insertion site c/w HCM (LGE accounts for 13% of total myocardial mass), subendocardial LGE in apical inferior wall suggesting small infarct, LVEF 52%, RVEF 52%.  Zio x 3 days showed 1 episode of NSVT lasting 6 beats.  Echocardiogram 10/2021 showed EF 45 to 50%, moderate LVH, grade 1 diastolic dysfunction, normal RV function, no significant valvular disease.  Echocardiogram 12/09/2022 showed EF 45 to 50%, grade 2 diastolic dysfunction, normal RV function, no  significant valvular disease.  Since last clinic visit, he reports he is doing okay.  Continues to have shortness of breath but denies any chest pain.  He has palpitations about once per week, last for hours.  Feels like heart is fluttering and also feels irregular.  Denies any lower extremity edema.  Reports some lightheadedness but denies any syncope.   Wt Readings from Last 3 Encounters:  03/28/23 265 lb (120.2 kg)  11/15/22 267 lb 6.4 oz (121.3 kg)  08/29/22 270 lb 9.6 oz (122.7 kg)   BP Readings from Last 3 Encounters:  03/28/23 110/76  11/15/22 (!) 128/90  08/29/22 (!) 138/90   Past Medical History:  Diagnosis Date   Arthritis    Cardiomyopathy (HCC) 09/22/2014   Hypertension     Past Surgical History:  Procedure Laterality Date   RIGHT/LEFT HEART CATH AND CORONARY ANGIOGRAPHY N/A 12/23/2019   Procedure: RIGHT/LEFT HEART CATH AND CORONARY ANGIOGRAPHY;  Surgeon: Runell Gess, MD;  Location: [facility] INVASIVE [facility];  Service: Cardiovascular;  Laterality: N/A;   Current Medications: Current Meds  Medication Sig   aspirin 81 MG chewable tablet Chew 1 tablet (81 mg total) by mouth daily.   carvedilol (COREG) 25 MG tablet Take 1.5 tablets (37.5 mg total) by mouth 2 (two) times daily with a meal.   cetirizine (ZYRTEC) 10 MG tablet Take 1 tablet (10 mg total) by mouth daily.   dapagliflozin propanediol (FARXIGA) 10 MG TABS tablet Take 1 tablet (10 mg total) by mouth daily.   diclofenac Sodium (VOLTAREN) 1 % GEL Apply 2 g topically 4 (four) times daily.  dicyclomine (BENTYL) 10 MG capsule Take 1 capsule (10 mg total) by mouth 2 (two) times daily as needed for spasms (diarrhea).   fluticasone (FLONASE) 50 MCG/ACT nasal spray Place 2 sprays into both nostrils daily.   hydrALAZINE (APRESOLINE) 50 MG tablet Take 2 tablets (100 mg total) by mouth 3 (three) times daily.   isosorbide mononitrate (IMDUR) 30 MG 24 hr tablet Take 1 tablet (30 mg total) by mouth daily.   Melatonin 10 MG TABS  Take 10 mg by mouth at bedtime.   meloxicam (MOBIC) 15 MG tablet Take 1 tablet (15 mg total) by mouth daily as needed for pain.   methocarbamol (ROBAXIN) 500 MG tablet Take 1 tablet (500 mg total) by mouth every 8 (eight) hours as needed for muscle spasms.   Multiple Vitamins-Minerals (MULTIVITAMIN WITH MINERALS) tablet Take 1 tablet by mouth daily.   polyethylene glycol powder (GLYCOLAX/MIRALAX) 17 GM/SCOOP powder Take 17 g by mouth daily.   rosuvastatin (CRESTOR) 10 MG tablet Take 1 tablet (10 mg total) by mouth daily.   sacubitril-valsartan (ENTRESTO) 97-103 MG Take 1 tablet by mouth 2 (two) times daily.   spironolactone (ALDACTONE) 25 MG tablet Take 1 tablet (25 mg total) by mouth daily.   traZODone (DESYREL) 50 MG tablet Take 1 tablet (50 mg total) by mouth at bedtime.     Allergies:   Strawberry extract   Social History   Socioeconomic History   Marital status: Single    Spouse name: Not on file   Number of children: Not on file   Years of education: Not on file   Highest education level: Not on file  Occupational History   Not on file  Tobacco Use   Smoking status: Former    Types: Cigarettes   Smokeless tobacco: Never  Vaping Use   Vaping status: Never Used  Substance and Sexual Activity   Alcohol use: Not Currently   Drug use: Yes    Types: Marijuana   Sexual activity: Not on file  Other Topics Concern   Not on file  Social History Narrative   Not on file   Social Determinants of Health   Financial Resource Strain: Medium Risk (08/23/2022)   Overall Financial Resource Strain (CARDIA)    Difficulty of Paying Living Expenses: Somewhat hard  Food Insecurity: Food Insecurity Present (08/23/2022)   Hunger Vital Sign    Worried About Running Out of Food in the Last Year: Often true    Ran Out of Food in the Last Year: Often true  Transportation Needs: No Transportation Needs (08/23/2022)   PRAPARE - Administrator, Civil Service (Medical): No    Lack of  Transportation (Non-Medical): No  Physical Activity: Insufficiently Active (08/23/2022)   Exercise Vital Sign    Days of Exercise per Week: 3 days    Minutes of Exercise per Session: 20 min  Stress: Not on file  Social Connections: Not on file     Family History: [name] family history includes Colon cancer in an other family member; Hypertension in his mother.  ROS:   Please see the history of present illness.      All other systems reviewed and are negative.  EKGs/Labs/Other Studies Reviewed:    The following studies were reviewed today: ECHO 06/08/2020:   IMPRESSIONS    1. Left ventricular ejection fraction, by estimation, is 45 to 50%. The  left ventricle has mildly decreased function. The left ventricle  demonstrates global hypokinesis. There is moderate concentric left  ventricular hypertrophy. Left ventricular  diastolic parameters are consistent with Grade II diastolic dysfunction  (pseudonormalization). Elevated left atrial pressure.   2. Right ventricular systolic function is normal. The right ventricular  size is normal. Tricuspid regurgitation signal is inadequate for assessing  PA pressure.   3. Left atrial size was mild to moderately dilated.   4. The mitral valve is grossly normal. Trivial mitral valve  regurgitation. No evidence of mitral stenosis.   5. The aortic valve is tricuspid. Aortic valve regurgitation is not  visualized. No aortic stenosis is present.   6. The inferior vena cava is normal in size with greater than 50%  respiratory variability, suggesting right atrial pressure of 3 mmHg.   R/L HEART CATH 12/23/2019 IMPRESSION: Mr. Schillig has normal coronary arteries, elevated LVEDP and fairly normal filling pressures. He has a nonischemic cardiomyopathy probably hypertensive in nature from failure to take his prescribed medications. The antecubital venous sheath was removed and pressure held. The radial sheath was removed and a TR band was placed  on the right wrist to achieve patent hemostasis. The patient left lab in stable condition. He'll be transferred back to [facility] once the TR band is off and resume guideline directed optimal medical treatment. He left the lab in stable condition.  ECHO 12/21/19  IMPRESSIONS    1. Left ventricular ejection fraction, by estimation, is <20%. The left  ventricle has severely decreased function. The left ventricle demonstrates  global hypokinesis. The left ventricular internal cavity size was  moderately dilated. There is moderate  concentric left ventricular hypertrophy. Left ventricular diastolic  parameters are consistent with Grade III diastolic dysfunction  (restrictive).   2. Right ventricular systolic function is severely reduced. The right  ventricular size is moderately enlarged.   3. Left atrial size was severely dilated.   4. Right atrial size was mildly dilated.   5. The mitral valve is normal in structure. Mild mitral valve  regurgitation. No evidence of mitral stenosis.   6. The aortic valve is normal in structure. Aortic valve regurgitation is  not visualized. No aortic stenosis is present.   7. There is mild dilatation of the ascending aorta measuring 40 mm.   8. The inferior vena cava is normal in size with greater than 50%  respiratory variability, suggesting right atrial pressure of 3 mmHg.   EKG:   11/15/2022: Sinus bradycardia, first-degree AV block, Q waves in V1-3, rate 50 11/11/2021: Sinus rhythm, first-degree AV block, rate 64, PVC, Q waves in V1-4 and 3, aVF 03/30/21: Normal sinus rhythm, rate 67, Q-wave in V1-4 2/22- NSR with first degree AV block, rate 74, TWI in I, II, aVL, V4-6  Recent Labs: 08/29/2022: ALT 40; Hemoglobin 14.2; Platelets 323 11/15/2022: BUN 15; Creatinine, Ser 1.18; Magnesium 2.3; Potassium 4.7; Sodium 142  Recent Lipid Panel    Component Value Date/Time   CHOL 133 08/29/2022 1148   TRIG 60 08/29/2022 1148   HDL 46 08/29/2022  1148   CHOLHDL 5.4 (H) 01/05/2021 0949   CHOLHDL 6.5 12/21/2019 0443   VLDL 13 12/21/2019 0443   LDLCALC 74 08/29/2022 1148    Physical Exam:    VS:  BP 110/76   Pulse 69   Ht 6' (1.829 m)   Wt 265 lb (120.2 kg)   SpO2 95%   BMI 35.94 kg/m     Wt Readings from Last 3 Encounters:  03/28/23 265 lb (120.2 kg)  11/15/22 267 lb 6.4 oz (121.3 kg)  08/29/22 270  lb 9.6 oz (122.7 kg)     GEN: Well nourished, well developed in no acute distress HEENT: Normal NECK: No JVD; No carotid bruits CARDIAC: RRR, no murmurs, rubs, gallops RESPIRATORY:  Clear to auscultation without rales, wheezing or rhonchi  ABDOMEN: Soft, non-tender, non-distended MUSCULOSKELETAL:  No edema; No deformity  SKIN: Warm and dry NEUROLOGIC:  Alert and oriented x 3 PSYCHIATRIC:  Normal affect   ASSESSMENT:    1. Chronic combined systolic and diastolic heart failure (HCC)   2. Palpitations   3. Hypertrophic cardiomyopathy (HCC)   4. Hyperlipidemia, unspecified hyperlipidemia type   5. Essential hypertension       PLAN:    Chronic combined systolic and diastolic heart failure: Echocardiogram 12/21/2019 showed LVEF less than 20% with global hypokinesis, moderate LVH, grade 3 diastolic dysfunction, severe RV dysfunction, mild MR, mild dilatation of the ascending aorta measuring 40 mm.  RHC/LHC on 12/23/2019 showed normal coronary arteries and filling pressures.  Nonischemic cardiomyopathy, suspect due to uncontrolled hypertension.  Repeat echocardiogram on 06/08/2020 showed significant improvement in LV systolic function (EF 45 to 50%), moderate LVH, grade 2 diastolic dysfunction, normal RV function, no significant valvular disease.  Cardiac MRI 05/10/21 showed severe asymmetric LVH measuring 20mm in basal septum (13mm in posterior wall) consistent with HCM, patchy LGE in basal septum and RV insertion site c/w HCM (LGE accounts for 13% of total myocardial mass), subendocardial LGE in apical inferior wall suggesting  small infarct, LVEF 52%, RVEF 52%.  Echocardiogram 10/2021 showed EF 45 to 50%, moderate LVH, grade 1 diastolic dysfunction, normal RV function, no significant valvular disease.  Echocardiogram 12/2022 showed EF 45 to 50%. -Continue Entresto 97-103 mg twice daily.  Check BMET -Continue carvedilol 37.5 mg twice daily -Continue spironolactone 25 mg daily -Continue hydralazine 100 mg 3 times daily and Imdur 30 mg daily -Continue Farxiga 10 mg daily  HCM: Cardiac MRI 05/10/21 showed severe asymmetric LVH measuring 20mm in basal septum (13mm in posterior wall) consistent with HCM, patchy LGE in basal septum and RV insertion site c/w HCM (LGE accounts for 13% of total myocardial mass), subendocardial LGE in apical inferior wall suggesting small infarct, LVEF 52%, RVEF 52%.  Zio x 3 days showed 1 episode of NSVT lasting 6 beats; d/w EP, stated that NSVT lasting less than 7 beats in HCM is not concerning, no other work-up recommended at this time.  He has 2 children, twins age 59, recommend that they be screened with echocardiogram  Palpitations: Description concerning for arrhythmia, will evaluate with Zio patch x 7 days  Hypertension: Continue carvedilol, spironolactone, hydralazine/Imdur, Entresto.  Appears controlled   Cocaine use: Denies recent use.  Encouraged continued cessation  Marijuana use: Denies recent use.  Encouraged continued cessation  GERD: Continue famotidine  Snoring: negative sleep study 05/16/21  Hyperlipidemia: LDL 150 12/2020.  10-year ASCVD risk score 10.5%.  Started on atorvastatin but reports did not tolerate due to palpitations when taking.  He was switched to rosuvastatin 10 mg daily, which he is tolerating.  LDL 70 08/2022  RTC in 6 months   Medication Adjustments/Labs and Tests Ordered: Current medicines are reviewed at length with the patient today.  Concerns regarding medicines are outlined above.  Orders Placed This Encounter  Procedures   Basic metabolic panel    Magnesium   LONG TERM MONITOR (3-14 DAYS)     No orders of the defined types were placed in this encounter.    Patient Instructions  Medication Instructions:  No changes *If you  need a refill on your cardiac medications before your next appointment, please call your pharmacy*   Lab Work: BMET. Magnesium If you have labs (blood work) drawn today and your tests are completely normal, you will receive your results only by: MyChart Message (if you have MyChart) OR A paper copy in the mail If you have any lab test that is abnormal or we need to change your treatment, we will call you to review the results.   Testing/Procedures: Christena Deem- Long Term Monitor Instructions  Your physician has requested you wear a ZIO patch monitor for 7 days.  This is a single patch monitor. [employer] supplies one patch monitor per enrollment. Additional stickers are not available. Please do not apply patch if you will be having a Nuclear Stress Test,  Echocardiogram, Cardiac CT, MRI, or Chest Xray during the period you would be wearing the  monitor. The patch cannot be worn during these tests. You cannot remove and re-apply the  ZIO XT patch monitor.  Your ZIO patch monitor will be mailed 3 day USPS to your address on file. It may take 3-5 days  to receive your monitor after you have been enrolled.  Once you have received your monitor, please review the enclosed instructions. Your monitor  has already been registered assigning a specific monitor serial # to you.  Billing and Patient Assistance Program Information  We have supplied [employer] with any of your insurance information on file for billing purposes. [employer] offers a sliding scale Patient Assistance Program for patients that do not have  insurance, or whose insurance does not completely cover the cost of the ZIO monitor.  You must apply for the Patient Assistance Program to qualify for this discounted rate.  To apply, please call [employer] at  [phone], select option 4, select option 2, ask to apply for  Patient Assistance Program. Meredeth Ide will ask your household income, and how many people  are in your household. They will quote your out-of-pocket cost based on that information.  [employer] will also be able to set up a 61-month, interest-free payment plan if needed.  Applying the monitor   Shave hair from upper left chest.  Hold abrader disc by orange tab. Rub abrader in 40 strokes over the upper left chest as  indicated in your monitor instructions.  Clean area with 4 enclosed alcohol pads. Let dry.  Apply patch as indicated in monitor instructions. Patch will be placed under collarbone on left  side of chest with arrow pointing upward.  Rub patch adhesive wings for 2 minutes. Remove Lapp label marked "1". Remove the Quiros  label marked "2". Rub patch adhesive wings for 2 additional minutes.  While looking in a mirror, press and release button in center of patch. A small green light will  flash 3-4 times. This will be your only indicator that the monitor has been turned on.  Do not shower for the first 24 hours. You may shower after the first 24 hours.  Press the button if you feel a symptom. You will hear a small click. Record Date, Time and  Symptom in the Patient Logbook.  When you are ready to remove the patch, follow instructions on the last 2 pages of Patient  Logbook. Stick patch monitor onto the last page of Patient Logbook.  Place Patient Logbook in the blue and [place] box. Use locking tab on box and tape box closed  securely. The blue and [employer] box has prepaid postage on it. Please place  it in the mailbox as  soon as possible. Your physician should have your test results approximately 7 days after the  monitor has been mailed back to [facility].  Call [facility] Customer Care at [phone] if you have questions regarding  your ZIO XT patch monitor. Call them immediately if you see an orange light  blinking on your  monitor.  If your monitor falls off in less than 4 days, contact our Monitor department at [phone].  If your monitor becomes loose or falls off after 4 days call [employer] at [phone] for  suggestions on securing your monitor    Follow-Up: At [facility], you and your health needs are our priority.  As part of our continuing mission to provide you with exceptional heart care, we have created designated Provider Care Teams.  These Care Teams include your primary Cardiologist (physician) and Advanced Practice Providers (APPs -  Physician Assistants and Nurse Practitioners) who all work together to provide you with the care you need, when you need it.  We recommend signing up for the patient portal called "MyChart".  Sign up information is provided on this After Visit Summary.  MyChart is used to connect with patients for Virtual Visits (Telemedicine).  Patients are able to view lab/test results, encounter notes, upcoming appointments, etc.  Non-urgent messages can be sent to your provider as well.   To learn more about what you can do with MyChart, go to [email or web address].    Your next appointment:   6 month(s)  Provider:   Little Ishikawa, MD           Signed, Little Ishikawa, MD  03/28/2023 10:17 PM    [facility]

## 2023-03-28 ENCOUNTER — Ambulatory Visit (INDEPENDENT_AMBULATORY_CARE_PROVIDER_SITE_OTHER): Payer: Medicaid Other

## 2023-03-28 ENCOUNTER — Encounter: Payer: Self-pay | Admitting: Cardiology

## 2023-03-28 ENCOUNTER — Ambulatory Visit: Payer: Medicaid Other | Attending: Cardiology | Admitting: Cardiology

## 2023-03-28 VITALS — BP 110/76 | HR 69 | Ht 72.0 in | Wt 265.0 lb

## 2023-03-28 DIAGNOSIS — E785 Hyperlipidemia, unspecified: Secondary | ICD-10-CM

## 2023-03-28 DIAGNOSIS — I422 Other hypertrophic cardiomyopathy: Secondary | ICD-10-CM | POA: Diagnosis not present

## 2023-03-28 DIAGNOSIS — I5042 Chronic combined systolic (congestive) and diastolic (congestive) heart failure: Secondary | ICD-10-CM

## 2023-03-28 DIAGNOSIS — R002 Palpitations: Secondary | ICD-10-CM

## 2023-03-28 DIAGNOSIS — I10 Essential (primary) hypertension: Secondary | ICD-10-CM

## 2023-03-28 NOTE — Patient Instructions (Signed)
Medication Instructions:  No changes *If you need a refill on your cardiac medications before your next appointment, please call your pharmacy*   Lab Work: BMET. Magnesium If you have labs (blood work) drawn today and your tests are completely normal, you will receive your results only by: MyChart Message (if you have MyChart) OR A paper copy in the mail If you have any lab test that is abnormal or we need to change your treatment, we will call you to review the results.   Testing/Procedures: Christena Deem- Long Term Monitor Instructions  Your physician has requested you wear a ZIO patch monitor for 7 days.  This is a single patch monitor. [employer] supplies one patch monitor per enrollment. Additional stickers are not available. Please do not apply patch if you will be having a Nuclear Stress Test,  Echocardiogram, Cardiac CT, MRI, or Chest Xray during the period you would be wearing the  monitor. The patch cannot be worn during these tests. You cannot remove and re-apply the  ZIO XT patch monitor.  Your ZIO patch monitor will be mailed 3 day USPS to your address on file. It may take 3-5 days  to receive your monitor after you have been enrolled.  Once you have received your monitor, please review the enclosed instructions. Your monitor  has already been registered assigning a specific monitor serial # to you.  Billing and Patient Assistance Program Information  We have supplied [employer] with any of your insurance information on file for billing purposes. [employer] offers a sliding scale Patient Assistance Program for patients that do not have  insurance, or whose insurance does not completely cover the cost of the ZIO monitor.  You must apply for the Patient Assistance Program to qualify for this discounted rate.  To apply, please call [employer] at [phone], select option 4, select option 2, ask to apply for  Patient Assistance Program. Meredeth Ide will ask your household income, and how many  people  are in your household. They will quote your out-of-pocket cost based on that information.  [employer] will also be able to set up a 16-month, interest-free payment plan if needed.  Applying the monitor   Shave hair from upper left chest.  Hold abrader disc by orange tab. Rub abrader in 40 strokes over the upper left chest as  indicated in your monitor instructions.  Clean area with 4 enclosed alcohol pads. Let dry.  Apply patch as indicated in monitor instructions. Patch will be placed under collarbone on left  side of chest with arrow pointing upward.  Rub patch adhesive wings for 2 minutes. Remove Righter label marked "1". Remove the [place]  label marked "2". Rub patch adhesive wings for 2 additional minutes.  While looking in a mirror, press and release button in center of patch. A small green light will  flash 3-4 times. This will be your only indicator that the monitor has been turned on.  Do not shower for the first 24 hours. You may shower after the first 24 hours.  Press the button if you feel a symptom. You will hear a small click. Record Date, Time and  Symptom in the Patient Logbook.  When you are ready to remove the patch, follow instructions on the last 2 pages of Patient  Logbook. Stick patch monitor onto the last page of Patient Logbook.  Place Patient Logbook in the blue and Candy box. Use locking tab on box and tape box closed  securely. The blue and Ferrari box  has prepaid postage on it. Please place it in the mailbox as  soon as possible. Your physician should have your test results approximately 7 days after the  monitor has been mailed back to [facility].  Call [facility] Customer Care at [phone] if you have questions regarding  your ZIO XT patch monitor. Call them immediately if you see an orange light blinking on your  monitor.  If your monitor falls off in less than 4 days, contact our Monitor department at [phone].  If your monitor becomes  loose or falls off after 4 days call [employer] at [phone] for  suggestions on securing your monitor    Follow-Up: At [facility], you and your health needs are our priority.  As part of our continuing mission to provide you with exceptional heart care, we have created designated Provider Care Teams.  These Care Teams include your primary Cardiologist (physician) and Advanced Practice Providers (APPs -  Physician Assistants and Nurse Practitioners) who all work together to provide you with the care you need, when you need it.  We recommend signing up for the patient portal called "MyChart".  Sign up information is provided on this After Visit Summary.  MyChart is used to connect with patients for Virtual Visits (Telemedicine).  Patients are able to view lab/test results, encounter notes, upcoming appointments, etc.  Non-urgent messages can be sent to your provider as well.   To learn more about what you can do with MyChart, go to [email or web address].    Your next appointment:   6 month(s)  Provider:   Little Ishikawa, MD

## 2023-03-28 NOTE — Progress Notes (Unsigned)
Enrolled for [employer] to mail a ZIO XT long term holter monitor to the patients address on file.  

## 2023-03-29 DIAGNOSIS — R002 Palpitations: Secondary | ICD-10-CM | POA: Diagnosis not present

## 2023-03-29 LAB — BASIC METABOLIC PANEL
BUN/Creatinine Ratio: 13 (ref 9–20)
BUN: 16 mg/dL (ref 6–24)
CO2: 23 mmol/L (ref 20–29)
Calcium: 10 mg/dL (ref 8.7–10.2)
Chloride: 103 mmol/L (ref 96–106)
Creatinine, Ser: 1.21 mg/dL (ref 0.76–1.27)
Glucose: 90 mg/dL (ref 70–99)
Potassium: 4.3 mmol/L (ref 3.5–5.2)
Sodium: 140 mmol/L (ref 134–144)
eGFR: 72 mL/min/{1.73_m2} (ref >59)

## 2023-03-29 LAB — MAGNESIUM: Magnesium: 2.1 mg/dL (ref 1.6–2.3)

## 2023-04-12 ENCOUNTER — Ambulatory Visit: Payer: Medicaid Other | Admitting: Family Medicine

## 2023-04-12 ENCOUNTER — Encounter: Payer: Self-pay | Admitting: Family Medicine

## 2023-04-12 VITALS — BP 112/80 | HR 71 | Temp 98.1°F | Ht 72.0 in | Wt 262.0 lb

## 2023-04-12 DIAGNOSIS — I5022 Chronic systolic (congestive) heart failure: Secondary | ICD-10-CM | POA: Diagnosis not present

## 2023-04-12 DIAGNOSIS — F411 Generalized anxiety disorder: Secondary | ICD-10-CM | POA: Diagnosis not present

## 2023-04-12 DIAGNOSIS — E1169 Type 2 diabetes mellitus with other specified complication: Secondary | ICD-10-CM | POA: Diagnosis not present

## 2023-04-12 DIAGNOSIS — F1411 Cocaine abuse, in remission: Secondary | ICD-10-CM | POA: Insufficient documentation

## 2023-04-12 DIAGNOSIS — B351 Tinea unguium: Secondary | ICD-10-CM | POA: Diagnosis not present

## 2023-04-12 DIAGNOSIS — Z1211 Encounter for screening for malignant neoplasm of colon: Secondary | ICD-10-CM

## 2023-04-12 DIAGNOSIS — R0981 Nasal congestion: Secondary | ICD-10-CM

## 2023-04-12 DIAGNOSIS — F331 Major depressive disorder, recurrent, moderate: Secondary | ICD-10-CM

## 2023-04-12 DIAGNOSIS — I110 Hypertensive heart disease with heart failure: Secondary | ICD-10-CM | POA: Diagnosis not present

## 2023-04-12 DIAGNOSIS — E291 Testicular hypofunction: Secondary | ICD-10-CM

## 2023-04-12 DIAGNOSIS — E66812 Obesity, class 2: Secondary | ICD-10-CM | POA: Insufficient documentation

## 2023-04-12 HISTORY — DX: Cocaine abuse, in remission: F14.11

## 2023-04-12 MED ORDER — FLUTICASONE PROPIONATE 50 MCG/ACT NA SUSP: 2.0000 | Freq: Every day | NASAL | 2 refills | Status: AC

## 2023-04-12 MED ORDER — CICLOPIROX 8 % EX SOLN: Freq: Every day | CUTANEOUS | 11 refills | Status: AC

## 2023-04-12 NOTE — Patient Instructions (Signed)
I have sent in ciclopirox for you to use to your nails daily. On day 7, clean the nails with alcohol and then re apply.  If nails are not improving by April, we can refer to the podiatrist  I have sent in flonase for you to use. May use 2 sprays daily.  Follow up with me in April for a physical

## 2023-04-12 NOTE — Progress Notes (Signed)
New Patient Office Visit  Subjective    Patient ID: Ronald Young, male    DOB: 04-04-1971  Age: 52 y.o. MRN: [id number]  CC:  Chief Complaint  Patient presents with   GI Problem    Coloscopy done- family history  Pt needs his finger nail looked at and his feet     HPI Ronald Young presents to establish care today. Has past medical history of CHF, anxiety, hypertension, chronic sinusitis. Reports that he also has some discolored fingernails and all of his toenails are thick and discolored.  He has never had this evaluated or treated.  Denies any discharge from the nails, denies pain or bleeding from the nails. Requesting referral for colon cancer screening for colonoscopy today.  Has family history of colon cancer in maternal grandmother. Reports persistent postnasal drip, states that nasal discharge is clear.  States that he has been using Vicks and oxymetazoline nasal sprays. Denies sinus pain or pressure, ear pain or pressure, cough, abdominal pain, nausea, vomiting diarrhea, rash, fever, other symptoms.  He is followed by Dr. Gaynelle Arabian with cardiology, reports that he has been having palpitations, Zio patch has been ordered and patient just received it today, so he will be wearing this for the next 2 weeks.    Outpatient Encounter Medications as of 04/12/2023  Medication Sig   aspirin 81 MG chewable tablet Chew 1 tablet (81 mg total) by mouth daily.   carvedilol (COREG) 25 MG tablet Take 1.5 tablets (37.5 mg total) by mouth 2 (two) times daily with a meal.   cetirizine (ZYRTEC) 10 MG tablet Take 1 tablet (10 mg total) by mouth daily.   ciclopirox (PENLAC) 8 % solution Apply topically at bedtime. Apply to affected nail and adjacent skin once daily. Remove with alcohol every 7 days; continue therapy until nail clearance   dapagliflozin propanediol (FARXIGA) 10 MG TABS tablet Take 1 tablet (10 mg total) by mouth daily.   diclofenac Sodium (VOLTAREN) 1 % GEL Apply 2 g topically 4  (four) times daily.   dicyclomine (BENTYL) 10 MG capsule Take 1 capsule (10 mg total) by mouth 2 (two) times daily as needed for spasms (diarrhea).   fluticasone (FLONASE) 50 MCG/ACT nasal spray Place 2 sprays into both nostrils daily.   hydrALAZINE (APRESOLINE) 50 MG tablet Take 2 tablets (100 mg total) by mouth 3 (three) times daily.   isosorbide mononitrate (IMDUR) 30 MG 24 hr tablet Take 1 tablet (30 mg total) by mouth daily.   Melatonin 10 MG TABS Take 10 mg by mouth at bedtime.   meloxicam (MOBIC) 15 MG tablet Take 1 tablet (15 mg total) by mouth daily as needed for pain.   methocarbamol (ROBAXIN) 500 MG tablet Take 1 tablet (500 mg total) by mouth every 8 (eight) hours as needed for muscle spasms.   Multiple Vitamins-Minerals (MULTIVITAMIN WITH MINERALS) tablet Take 1 tablet by mouth daily.   polyethylene glycol powder (GLYCOLAX/MIRALAX) 17 GM/SCOOP powder Take 17 g by mouth daily.   rosuvastatin (CRESTOR) 10 MG tablet Take 1 tablet (10 mg total) by mouth daily.   sacubitril-valsartan (ENTRESTO) 97-103 MG Take 1 tablet by mouth 2 (two) times daily.   spironolactone (ALDACTONE) 25 MG tablet Take 1 tablet (25 mg total) by mouth daily.   traZODone (DESYREL) 50 MG tablet Take 1 tablet (50 mg total) by mouth at bedtime.   [DISCONTINUED] fluticasone (FLONASE) 50 MCG/ACT nasal spray Place 2 sprays into both nostrils daily.   escitalopram (LEXAPRO) 20 MG  tablet Take 1 tablet (20 mg total) by mouth daily.   No facility-administered encounter medications on file as of 04/12/2023.    Past Medical History:  Diagnosis Date   Arthritis    Cardiomyopathy (HCC) 09/22/2014   Hypertension     Past Surgical History:  Procedure Laterality Date   RIGHT/LEFT HEART CATH AND CORONARY ANGIOGRAPHY N/A 12/23/2019   Procedure: RIGHT/LEFT HEART CATH AND CORONARY ANGIOGRAPHY;  Surgeon: Runell Gess, MD;  Location: [facility];  Service: Cardiovascular;  Laterality: N/A;    Family History   Problem Relation Age of Onset   Hypertension Mother    Colon cancer Other     Social History   Socioeconomic History   Marital status: Single    Spouse name: Not on file   Number of children: Not on file   Years of education: Not on file   Highest education level: Not on file  Occupational History   Not on file  Tobacco Use   Smoking status: Former    Types: Cigarettes   Smokeless tobacco: Never  Vaping Use   Vaping status: Never Used  Substance and Sexual Activity   Alcohol use: Not Currently   Drug use: Yes    Types: Marijuana   Sexual activity: Not on file  Other Topics Concern   Not on file  Social History Narrative   Not on file   Social Determinants of Health   Financial Resource Strain: Medium Risk (08/23/2022)   Overall Financial Resource Strain (CARDIA)    Difficulty of Paying Living Expenses: Somewhat hard  Food Insecurity: Food Insecurity Present (08/23/2022)   Hunger Vital Sign    Worried About Running Out of Food in the Last Year: Often true    Ran Out of Food in the Last Year: Often true  Transportation Needs: No Transportation Needs (08/23/2022)   PRAPARE - Administrator, Civil Service (Medical): No    Lack of Transportation (Non-Medical): No  Physical Activity: Insufficiently Active (08/23/2022)   Exercise Vital Sign    Days of Exercise per Week: 3 days    Minutes of Exercise per Session: 20 min  Stress: Not on file  Social Connections: Not on file  Intimate Partner Violence: Not on file    ROS Per HPI      Objective    BP 112/80 (BP Location: Left Arm, Patient Position: Sitting, Cuff Size: Normal)   Pulse 71   Temp 98.1 F (36.7 C) (Oral)   Ht 6' (1.829 m)   Wt 262 lb (118.8 kg)   SpO2 98%   BMI 35.53 kg/m   Physical Exam Vitals and nursing note reviewed.  Constitutional:      General: He is not in acute distress.    Appearance: Normal appearance. He is obese.  HENT:     Head: Normocephalic and atraumatic.      Nose: Nose normal.     Mouth/Throat:     Mouth: Mucous membranes are moist.     Pharynx: Oropharynx is clear.  Eyes:     Extraocular Movements: Extraocular movements intact.  Cardiovascular:     Rate and Rhythm: Normal rate and regular rhythm.     Pulses: Normal pulses.     Heart sounds: Normal heart sounds.  Pulmonary:     Effort: Pulmonary effort is normal. No respiratory distress.     Breath sounds: Normal breath sounds. No stridor. No wheezing, rhonchi or rales.  Musculoskeletal:  General: Normal range of motion.     Cervical back: Normal range of motion.  Lymphadenopathy:     Cervical: No cervical adenopathy.  Skin:    General: Skin is warm and dry.     Comments: All 10 toenails thickened and discolored, R thumb and 1st finger nails thickened and discolored. No discharge or bleeding noted.  Neurological:     General: No focal deficit present.     Mental Status: He is alert and oriented to person, place, and time.  Psychiatric:        Mood and Affect: Mood normal.        Behavior: Behavior normal.         Assessment & Plan:   Hypertensive heart disease with chronic systolic congestive heart failure (HCC)  -Continue current medication regimen -Follow-up with cardiology as scheduled  Moderate episode of recurrent major depressive disorder (HCC)  -Continue current medication regimen  Hypogonadism in male  -Testosterone checked and was normal in April 2024,  Generalized anxiety disorder  -Continue current medication regimen, coping skills  Onychomycosis of multiple toenails with type 2 diabetes mellitus (HCC) -     Ciclopirox; Apply topically at bedtime. Apply to affected nail and adjacent skin once daily. Remove with alcohol every 7 days; continue therapy until nail clearance  Dispense: 6.6 mL; Refill: 11  Colon cancer screening -     Ambulatory referral to Gastroenterology  Nasal congestion -     Fluticasone Propionate; Place 2 sprays into both  nostrils daily.  Dispense: 9.9 mL; Refill: 2    Return in about 5 months (around 09/10/2023) for CPE.   Moshe Cipro, FNP

## 2023-04-13 ENCOUNTER — Ambulatory Visit (HOSPITAL_COMMUNITY): Payer: No Payment, Other | Admitting: Licensed Clinical Social Worker

## 2023-04-17 ENCOUNTER — Ambulatory Visit (INDEPENDENT_AMBULATORY_CARE_PROVIDER_SITE_OTHER): Payer: Medicaid Other | Admitting: Licensed Clinical Social Worker

## 2023-04-17 ENCOUNTER — Ambulatory Visit (HOSPITAL_COMMUNITY): Payer: Medicaid Other | Admitting: Licensed Clinical Social Worker

## 2023-04-17 DIAGNOSIS — F411 Generalized anxiety disorder: Secondary | ICD-10-CM
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
## 2023-04-18 DIAGNOSIS — R002 Palpitations: Secondary | ICD-10-CM

## 2023-05-01 ENCOUNTER — Ambulatory Visit (INDEPENDENT_AMBULATORY_CARE_PROVIDER_SITE_OTHER): Payer: Medicaid Other | Admitting: Licensed Clinical Social Worker

## 2023-05-01 DIAGNOSIS — F411 Generalized anxiety disorder: Secondary | ICD-10-CM | POA: Diagnosis not present

## 2023-05-01 NOTE — Progress Notes (Signed)
   THERAPIST PROGRESS NOTE  Session Time: 55 minutes  Participation Level: Active  Behavioral Response: CasualAlertAnxious and Irritable  Type of Therapy: Individual Therapy  Treatment Goals addressed: anxiety, personal relationships  ProgressTowards Goals: Progressing  Interventions: CBT and Supportive  Summary: Ronald Young is a 52 y.o. male who presents for f/u with this cln. He arrives on time and maintains appropriate eye contact throughout the session. He reports that his Christmas holiday did not go well. He shares that in the days prior, both daughters came to his home and one of them was judgmental and it looked like she was either filming or face-timing someone else while she was there. He states that his daughter that lives in [place], [place], said she would not be coming over on Christmas Day because she had pneumonia, which she caught from her mother, but that she was still coming to [place] and she did not elaborate further. He states his other daughter TN, who was also supposed to come and bring her daughter, did not call or show up. He states his mother bought presents for them and the granddaughter and no one has picked them up. Pt states he has been furious since Christmas Day because he is certain TM was lying about being sick. He describes TM's pattern of lying and states that she and her sister do it out of disrespect. He states that they continue to disrespect his mother and after the events of Christmas Day, he is no longer interested in having a relationship with them or even speaking to them. He states he is still angry and wants to consult an attorney about disinheriting them. He states it does not feel like a loss, that it has been a "long time coming." He denies having any interest in telling them how he feels, stating he doesn't want to talk to them at all. He processes thoughts and feelings related to this and is receptive to feedback.  Suicidal/Homicidal:  Nowithout intent/plan  Therapist Response: Cln assessed for current stressors, symptoms, and safety since last session. Cln utilized active listening and validation to help pt feel heard and understood, express thoughts and feelings, and gain insight into their experiences. Cln supported pt in processing thoughts and feelings related to his decision to stop pursuing a relationship with his daughters. Cln normalized potential feelings of grief once his anger subsides and encouraged pt to tell his daughters how he feels. Cln discussed the benefits of writing out what he would say to them even if he doesn't tell them himself. Cln reminded pt that it is up to his mother to decide what she will tolerate from others. Cln scheduled follow-up appointment and confirmed pt's availability and preferred method of service delivery (in-person).  Plan: Return again in 2 weeks.  Diagnosis: Generalized anxiety disorder  Collaboration of Care: Other none required for this visit  Patient/Guardian was advised Release of Information must be obtained prior to any record release in order to collaborate their care with an outside provider. Patient/Guardian was advised if they have not already done so to contact the registration department to sign all necessary forms in order for [place] to release information regarding their care.   Consent: Patient/Guardian gives verbal consent for treatment and assignment of benefits for services provided during this visit. Patient/Guardian expressed understanding and agreed to proceed.   Wyvonnia Lora, LCSW 05/01/2023

## 2023-05-04 ENCOUNTER — Encounter: Payer: Self-pay | Admitting: Gastroenterology

## 2023-05-15 ENCOUNTER — Ambulatory Visit (HOSPITAL_COMMUNITY): Payer: No Payment, Other | Admitting: Licensed Clinical Social Worker

## 2023-05-26 ENCOUNTER — Telehealth (HOSPITAL_COMMUNITY): Payer: Self-pay | Admitting: Licensed Clinical Social Worker

## 2023-05-29 ENCOUNTER — Ambulatory Visit (HOSPITAL_COMMUNITY): Payer: No Payment, Other | Admitting: Licensed Clinical Social Worker

## 2023-05-30 ENCOUNTER — Ambulatory Visit (AMBULATORY_SURGERY_CENTER): Payer: Medicaid Other

## 2023-05-30 VITALS — Ht 72.0 in | Wt 270.0 lb

## 2023-05-30 DIAGNOSIS — Z1211 Encounter for screening for malignant neoplasm of colon: Secondary | ICD-10-CM

## 2023-05-30 MED ORDER — SUFLAVE 178.7 G PO SOLR: 1.0000 | ORAL | 0 refills | Status: DC

## 2023-05-30 NOTE — Progress Notes (Signed)
No egg or soy allergy known to patient  No issues known to pt with past sedation with any surgeries or procedures Patient denies ever being told they had issues or difficulty with intubation  No FH of Malignant Hyperthermia Pt is not on diet pills Pt is not on  home 02  Pt is not on blood thinners  Pt reports constipation dx of IBS  No A fib or A flutter Have any cardiac testing pending-- no LOA: independent  Prep: suflave   Patient's chart reviewed by Cathlyn Parsons CNRA prior to previsit and patient appropriate for the [facility].  Previsit completed and red dot placed by patient's name on their procedure day (on provider's schedule).     PV completed with patient. Prep instructions sent to home address

## 2023-06-12 ENCOUNTER — Ambulatory Visit (INDEPENDENT_AMBULATORY_CARE_PROVIDER_SITE_OTHER): Payer: Medicaid Other | Admitting: Licensed Clinical Social Worker

## 2023-06-12 DIAGNOSIS — F411 Generalized anxiety disorder: Secondary | ICD-10-CM | POA: Diagnosis not present

## 2023-06-13 ENCOUNTER — Telehealth: Payer: Self-pay

## 2023-06-13 DIAGNOSIS — Z1211 Encounter for screening for malignant neoplasm of colon: Secondary | ICD-10-CM

## 2023-06-13 MED ORDER — SUFLAVE 178.7 G PO SOLR: 1.0000 | ORAL | 0 refills | Status: DC

## 2023-06-13 NOTE — Telephone Encounter (Signed)
Patient has not been in contact with [facility] to arrange for his prep for his colonoscopy on 06/19/23.  Prescription has been sent to [facility] per his request.
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
## 2023-06-16 NOTE — Telephone Encounter (Signed)
Patient stated he is going to pick up prep today from pharmacy.

## 2023-06-19 ENCOUNTER — Encounter: Payer: Self-pay | Admitting: Gastroenterology

## 2023-06-19 ENCOUNTER — Ambulatory Visit (AMBULATORY_SURGERY_CENTER): Payer: Medicaid Other | Admitting: Gastroenterology

## 2023-06-19 VITALS — BP 142/91 | HR 57 | Temp 97.7°F | Resp 14 | Ht 72.0 in | Wt 270.0 lb

## 2023-06-19 DIAGNOSIS — Z1211 Encounter for screening for malignant neoplasm of colon: Secondary | ICD-10-CM

## 2023-06-19 DIAGNOSIS — D122 Benign neoplasm of ascending colon: Secondary | ICD-10-CM

## 2023-06-19 DIAGNOSIS — I10 Essential (primary) hypertension: Secondary | ICD-10-CM | POA: Diagnosis not present

## 2023-06-19 MED ORDER — SODIUM CHLORIDE 0.9 % IV SOLN: 500.0000 mL | Freq: Once | INTRAVENOUS | Status: DC

## 2023-06-19 NOTE — Progress Notes (Signed)
 Called to room to assist during endoscopic procedure.  Patient ID and intended procedure confirmed with present staff. Received instructions for my participation in the procedure from the performing physician.

## 2023-06-19 NOTE — Progress Notes (Signed)
 Pt's states no medical or surgical changes since previsit or office visit.

## 2023-06-19 NOTE — Progress Notes (Signed)
History and Physical:  This patient presents for endoscopic testing for: Encounter Diagnosis  Name Primary?   Special screening for malignant neoplasms, colon Yes    Average risk for colorectal cancer.(MGM had CRC)   1st screening exam.  Patient denies chronic abdominal pain, rectal bleeding or diarrhea.  Chronic constipation - takes miralax. Last cardiology office note from July 2024 reviewed.   Patient is otherwise without complaints or active issues today.   Past Medical History: Past Medical History:  Diagnosis Date   Arthritis    Cardiomyopathy (HCC) 09/22/2014   Hypertension      Past Surgical History: Past Surgical History:  Procedure Laterality Date   RIGHT/LEFT HEART CATH AND CORONARY ANGIOGRAPHY N/A 12/23/2019   Procedure: RIGHT/LEFT HEART CATH AND CORONARY ANGIOGRAPHY;  Surgeon: Runell Gess, MD;  Location: [facility];  Service: Cardiovascular;  Laterality: N/A;    Allergies: Allergies  Allergen Reactions   Strawberry Extract Hives    Outpatient Meds: Current Outpatient Medications  Medication Sig Dispense Refill   aspirin 81 MG chewable tablet Chew 1 tablet (81 mg total) by mouth daily. 30 tablet 0   carvedilol (COREG) 25 MG tablet Take 1.5 tablets (37.5 mg total) by mouth 2 (two) times daily with a meal. 270 tablet 1   cetirizine (ZYRTEC) 10 MG tablet Take 1 tablet (10 mg total) by mouth daily. 30 tablet 1   dapagliflozin propanediol (FARXIGA) 10 MG TABS tablet Take 1 tablet (10 mg total) by mouth daily. 90 tablet 2   hydrALAZINE (APRESOLINE) 50 MG tablet Take 2 tablets (100 mg total) by mouth 3 (three) times daily. 540 tablet 3   isosorbide mononitrate (IMDUR) 30 MG 24 hr tablet Take 1 tablet (30 mg total) by mouth daily. 90 tablet 3   Melatonin 10 MG TABS Take 10 mg by mouth at bedtime. 30 tablet 1   meloxicam (MOBIC) 15 MG tablet Take 1 tablet (15 mg total) by mouth daily as needed for pain. 30 tablet 1   rosuvastatin (CRESTOR) 10 MG tablet  Take 1 tablet (10 mg total) by mouth daily. 90 tablet 3   sacubitril-valsartan (ENTRESTO) 97-103 MG Take 1 tablet by mouth 2 (two) times daily. 180 tablet 3   spironolactone (ALDACTONE) 25 MG tablet Take 1 tablet (25 mg total) by mouth daily. 90 tablet 3   traZODone (DESYREL) 50 MG tablet Take 1 tablet (50 mg total) by mouth at bedtime. 30 tablet 1   diclofenac Sodium (VOLTAREN) 1 % GEL Apply 2 g topically 4 (four) times daily. 100 g O   dicyclomine (BENTYL) 10 MG capsule Take 1 capsule (10 mg total) by mouth 2 (two) times daily as needed for spasms (diarrhea). 60 capsule 3   escitalopram (LEXAPRO) 20 MG tablet Take 1 tablet (20 mg total) by mouth daily. 30 tablet 1   fluticasone (FLONASE) 50 MCG/ACT nasal spray Place 2 sprays into both nostrils daily. 9.9 mL 2   methocarbamol (ROBAXIN) 500 MG tablet Take 1 tablet (500 mg total) by mouth every 8 (eight) hours as needed for muscle spasms. 60 tablet 0   polyethylene glycol powder (GLYCOLAX/MIRALAX) 17 GM/SCOOP powder Take 17 g by mouth daily. 3350 g 1   Current Facility-Administered Medications  Medication Dose Route Frequency Provider Last Rate Last Admin   0.9 %  sodium chloride infusion  500 mL Intravenous Once Danis, Andreas Blower, MD          ___________________________________________________________________ Objective   Exam:  BP 131/85  Pulse 60   Temp 97.7 F (36.5 C)   Ht 6' (1.829 m)   Wt 270 lb (122.5 kg)   SpO2 98%   BMI 36.62 kg/m   CV: regular , S1/S2 Resp: clear to auscultation bilaterally, normal RR and effort noted GI: soft, no tenderness, with active bowel sounds.   Assessment: Encounter Diagnosis  Name Primary?   Special screening for malignant neoplasms, colon Yes     Plan: Colonoscopy   The benefits and risks of the planned procedure were described in detail with the patient or (when appropriate) their health care proxy.  Risks were outlined as including, but not limited to, bleeding, infection,  perforation, adverse medication reaction leading to cardiac or pulmonary decompensation, pancreatitis (if ERCP).  The limitation of incomplete mucosal visualization was also discussed.  No guarantees or warranties were given.  The patient is appropriate for an endoscopic procedure in the ambulatory setting.   - Amada Jupiter, MD

## 2023-06-19 NOTE — Patient Instructions (Signed)
Educational handout provided to patient related to Polyps  Resume previous diet  Continue present medications  Awaiting pathology results  REPEAT COLONOSCOPY IN 5 YEARS FOR SURVEILLANCE   YOU HAD AN ENDOSCOPIC PROCEDURE TODAY AT THE [facility]:   Refer to the procedure report that was given to you for any specific questions about what was found during the examination.  If the procedure report does not answer your questions, please call your gastroenterologist to clarify.  If you requested that your care partner not be given the details of your procedure findings, then the procedure report has been included in a sealed envelope for you to review at your convenience later.  YOU SHOULD EXPECT: Some feelings of bloating in the abdomen. Passage of more gas than usual.  Walking can help get rid of the air that was put into your GI tract during the procedure and reduce the bloating. If you had a lower endoscopy (such as a colonoscopy or flexible sigmoidoscopy) you may notice spotting of blood in your stool or on the toilet paper. If you underwent a bowel prep for your procedure, you may not have a normal bowel movement for a few days.  Please Note:  You might notice some irritation and congestion in your nose or some drainage.  This is from the oxygen used during your procedure.  There is no need for concern and it should clear up in a day or so.  SYMPTOMS TO REPORT IMMEDIATELY:  Following lower endoscopy (colonoscopy or flexible sigmoidoscopy):  Excessive amounts of blood in the stool  Significant tenderness or worsening of abdominal pains  Swelling of the abdomen that is new, acute  Fever of 100F or higher  For urgent or emergent issues, a gastroenterologist can be reached at any hour by calling [phone]. Do not use MyChart messaging for urgent concerns.    DIET:  We do recommend a small meal at first, but then you may proceed to your regular diet.  Drink plenty of fluids  but you should avoid alcoholic beverages for 24 hours.  ACTIVITY:  You should plan to take it easy for the rest of today and you should NOT DRIVE or use heavy machinery until tomorrow (because of the sedation medicines used during the test).    FOLLOW UP: Our staff will call the number listed on your records the next business day following your procedure.  We will call around 7:15- 8:00 am to check on you and address any questions or concerns that you may have regarding the information given to you following your procedure. If we do not reach you, we will leave a message.     If any biopsies were taken you will be contacted by phone or by letter within the next 1-3 weeks.  Please call us at [phone] if you have not heard about the biopsies in 3 weeks.    SIGNATURES/CONFIDENTIALITY: You and/or your care partner have signed paperwork which will be entered into your electronic medical record.  These signatures attest to the fact that that the information above on your After Visit Summary has been reviewed and is understood.  Full responsibility of the confidentiality of this discharge information lies with you and/or your care-partner.

## 2023-06-19 NOTE — Op Note (Signed)
[facility] Patient Name: Ronald Young Procedure Date: 06/19/2023 10:36 AM MRN: [id number] Endoscopist: Sherilyn Cooter L. Myrtie Neither , MD, [id number] Age: 53 Referring MD:  Date of Birth: 09/04/1970 Gender: Male Account #: [id number] Procedure:                Colonoscopy Indications:              Screening for colorectal malignant neoplasm, This                            is the patient's first colonoscopy                           Incidental constipation noted Medicines:                Monitored Anesthesia Care Procedure:                Pre-Anesthesia Assessment:                           - Prior to the procedure, a History and Physical                            was performed, and patient medications and                            allergies were reviewed. [name] tolerance of                            previous anesthesia was also reviewed. The risks                            and benefits of the procedure and the sedation                            options and risks were discussed with the patient.                            All questions were answered, and informed consent                            was obtained. Prior Anticoagulants: The patient has                            taken no anticoagulant or antiplatelet agents. ASA                            Grade Assessment: III - A patient with severe                            systemic disease. After reviewing the risks and                            benefits, the patient was deemed in satisfactory  condition to undergo the procedure.                           After obtaining informed consent, the colonoscope                            was passed under direct vision. Throughout the                            procedure, the patient's blood pressure, pulse, and                            oxygen saturations were monitored continuously. The                            CF [id number] #[id number] was introduced through  the anus                            and advanced to the the cecum, identified by                            appendiceal orifice and ileocecal valve. The                            colonoscopy was performed without difficulty. The                            patient tolerated the procedure well. The quality                            of the bowel preparation was good after lavage of                            opaque liquid. The ileocecal valve, appendiceal                            orifice, and rectum were photographed. The bowel                            preparation used was SUPREP via split dose                            instruction. Scope In: 10:44:30 AM Scope Out: 11:02:58 AM Scope Withdrawal Time: 0 hours 15 minutes 55 seconds  Total Procedure Duration: 0 hours 18 minutes 28 seconds  Findings:                 The perianal and digital rectal examinations were                            normal.                           A diminutive polyp was found in the proximal  ascending colon. The polyp was sessile. The polyp                            was removed with a cold snare. Resection and                            retrieval were complete.                           Repeat examination of right colon under NBI                            performed.                           The exam was otherwise without abnormality on                            direct and retroflexion views. Complications:            No immediate complications. Estimated Blood Loss:     Estimated blood loss was minimal. Impression:               - One diminutive polyp in the proximal ascending                            colon, removed with a cold snare. Resected and                            retrieved.                           - The examination was otherwise normal on direct                            and retroflexion views. Recommendation:           - Patient has a contact number available  for                            emergencies. The signs and symptoms of potential                            delayed complications were discussed with the                            patient. Return to normal activities tomorrow.                            Written discharge instructions were provided to the                            patient.                           - Resume previous diet.                           -  Continue present medications.                           - Await pathology results.                           - Repeat colonoscopy in 5 years for surveillance.                            Nulytely prep for next exam. (chronic constipation,                            see prep details above) Yarel Kilcrease L. Myrtie Neither, MD 06/19/2023 11:09:28 AM This report has been signed electronically.

## 2023-06-19 NOTE — Progress Notes (Signed)
 To pacu, VSS. Report to [email or web address]

## 2023-06-20 ENCOUNTER — Telehealth: Payer: Self-pay

## 2023-06-20 NOTE — Telephone Encounter (Signed)
  Follow up Call-     06/19/2023    9:25 AM  Call back number  Post procedure Call Back phone  # [phone] mother number  Permission to leave phone message Yes     Patient questions:  Do you have a fever, pain , or abdominal swelling? No. Pain Score  0 *  Have you tolerated food without any problems? Yes.    Have you been able to return to your normal activities? Yes.    Do you have any questions about your discharge instructions: Diet   No. Medications  No. Follow up visit  No.  Do you have questions or concerns about your Care? No.  Actions: * If pain score is 4 or above: No action needed, pain <4.

## 2023-06-21 LAB — SURGICAL PATHOLOGY

## 2023-06-22 ENCOUNTER — Encounter: Payer: Self-pay | Admitting: Gastroenterology

## 2023-06-26 ENCOUNTER — Ambulatory Visit (HOSPITAL_COMMUNITY): Payer: Medicaid Other | Admitting: Licensed Clinical Social Worker

## 2023-06-28 ENCOUNTER — Telehealth (HOSPITAL_COMMUNITY): Payer: Self-pay | Admitting: Licensed Clinical Social Worker

## 2023-06-29 NOTE — Telephone Encounter (Signed)
 See call intake

## 2023-06-30 ENCOUNTER — Telehealth (HOSPITAL_COMMUNITY): Payer: Self-pay | Admitting: Licensed Clinical Social Worker

## 2023-06-30 NOTE — Telephone Encounter (Signed)
 See call intake

## 2023-07-10 ENCOUNTER — Telehealth (HOSPITAL_COMMUNITY): Payer: Self-pay | Admitting: Licensed Clinical Social Worker

## 2023-07-10 ENCOUNTER — Ambulatory Visit (HOSPITAL_COMMUNITY): Payer: Medicaid Other | Admitting: Licensed Clinical Social Worker

## 2023-07-10 NOTE — Telephone Encounter (Signed)
 See call intake

## 2023-07-31 ENCOUNTER — Ambulatory Visit (INDEPENDENT_AMBULATORY_CARE_PROVIDER_SITE_OTHER): Admitting: Licensed Clinical Social Worker

## 2023-07-31 DIAGNOSIS — F411 Generalized anxiety disorder: Secondary | ICD-10-CM | POA: Diagnosis not present
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
## 2023-08-08 ENCOUNTER — Other Ambulatory Visit: Payer: Self-pay | Admitting: Family Medicine

## 2023-08-08 DIAGNOSIS — I110 Hypertensive heart disease with heart failure: Secondary | ICD-10-CM

## 2023-08-09 ENCOUNTER — Telehealth: Payer: Self-pay | Admitting: Cardiology

## 2023-08-09 DIAGNOSIS — I110 Hypertensive heart disease with heart failure: Secondary | ICD-10-CM

## 2023-08-09 MED ORDER — ROSUVASTATIN CALCIUM 10 MG PO TABS: 10.0000 mg | ORAL_TABLET | Freq: Every day | ORAL | 2 refills | Status: DC

## 2023-08-09 MED ORDER — CARVEDILOL 25 MG PO TABS: 37.5000 mg | ORAL_TABLET | Freq: Two times a day (BID) | ORAL | 2 refills | Status: DC

## 2023-08-09 MED ORDER — SPIRONOLACTONE 25 MG PO TABS: 25.0000 mg | ORAL_TABLET | Freq: Every day | ORAL | 2 refills | Status: DC

## 2023-08-09 MED ORDER — HYDRALAZINE HCL 50 MG PO TABS: 100.0000 mg | ORAL_TABLET | Freq: Three times a day (TID) | ORAL | 2 refills | Status: DC

## 2023-08-09 MED ORDER — ISOSORBIDE MONONITRATE ER 30 MG PO TB24: 30.0000 mg | ORAL_TABLET | Freq: Every day | ORAL | 2 refills | Status: DC

## 2023-08-09 NOTE — Telephone Encounter (Signed)
*  STAT* If patient is at the pharmacy, call can be transferred to refill team.   1. Which medications need to be refilled? (please list name of each medication and dose if known) carvedilol (COREG) 25 MG tablet  Take 1.5 tablets (37.5 mg total) by mouth 2 (two) times daily with a meal.   spironolactone (ALDACTONE) 25 MG tablet  Take 1 tablet (25 mg total) by mouth daily.   rosuvastatin (CRESTOR) 10 MG tablet  Take 1 tablet (10 mg total) by mouth daily.  isosorbide mononitrate (IMDUR) 30 MG 24 hr tablet  Take 1 tablet (30 mg total) by mouth daily.   hydrALAZINE (APRESOLINE) 50 MG tablet  Take 2 tablets (100 mg total) by mouth 3 (three) times daily.  2. Would you like to learn more about the convenience, safety, & potential cost savings by using the [facility]? No   3. Are you open to using the [facility] No   4. Which pharmacy/location (including street and city if local pharmacy) is medication to be sent to? [id number] - [place] ([place] - [address]     5. Do they need a 30 day or 90 day supply? 90 Day Supply  Pt is currently out of medications

## 2023-08-14 ENCOUNTER — Ambulatory Visit (INDEPENDENT_AMBULATORY_CARE_PROVIDER_SITE_OTHER): Admitting: Licensed Clinical Social Worker

## 2023-08-14 DIAGNOSIS — F411 Generalized anxiety disorder: Secondary | ICD-10-CM
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
## 2023-08-28 ENCOUNTER — Ambulatory Visit (INDEPENDENT_AMBULATORY_CARE_PROVIDER_SITE_OTHER): Admitting: Licensed Clinical Social Worker

## 2023-08-28 DIAGNOSIS — F411 Generalized anxiety disorder: Secondary | ICD-10-CM
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
## 2023-09-10 NOTE — Progress Notes (Unsigned)
 Complete physical exam  Patient: Ronald Young   DOB: Jun 24, 1970   53 y.o. Male  MRN: [id number]  Subjective:     No chief complaint on file.   Ronald Young is a 53 y.o. male who presents today for a complete physical exam. He reports consuming a {diet types:17450} diet. {types:19826} He generally feels {DESC; WELL/FAIRLY WELL/POORLY:18703}. He reports sleeping {DESC; WELL/FAIRLY WELL/POORLY:18703}. He {does/does not:200015} have additional problems to discuss today.    Most recent fall risk assessment:    04/12/2023    2:11 PM  Fall Risk   Falls in the past year? 0  Number falls in past yr: 0  Injury with Fall? 0  Follow up Falls evaluation completed     Most recent depression screenings:    04/12/2023    2:13 PM 08/29/2022   11:10 AM  PHQ 2/9 Scores  PHQ - 2 Score 0 4  PHQ- 9 Score 0 18    {VISON DENTAL STD PSA (Optional):27386}  {History (Optional):23778}  Patient Care Team: Wellington Half, FNP as PCP - General (Family Medicine) Wendie Hamburg, MD as PCP - Cardiology (Cardiology) Arman Berlin, LCSW as Social Worker (Licensed Clinical Social Worker)   Outpatient Medications Prior to Visit  Medication Sig  . aspirin  81 MG chewable tablet Chew 1 tablet (81 mg total) by mouth daily.  . carvedilol  (COREG ) 25 MG tablet Take 1.5 tablets (37.5 mg total) by mouth 2 (two) times daily with a meal.  . cetirizine  (ZYRTEC ) 10 MG tablet Take 1 tablet (10 mg total) by mouth daily.  . dapagliflozin  propanediol (FARXIGA ) 10 MG TABS tablet Take 1 tablet (10 mg total) by mouth daily.  . diclofenac  Sodium (VOLTAREN ) 1 % GEL Apply 2 g topically 4 (four) times daily.  . dicyclomine  (BENTYL ) 10 MG capsule Take 1 capsule (10 mg total) by mouth 2 (two) times daily as needed for spasms (diarrhea).  . escitalopram  (LEXAPRO ) 20 MG tablet Take 1 tablet (20 mg total) by mouth daily.  . fluticasone  (FLONASE ) 50 MCG/ACT nasal spray Place 2 sprays into both nostrils  daily.  . hydrALAZINE  (APRESOLINE ) 50 MG tablet Take 2 tablets (100 mg total) by mouth 3 (three) times daily.  . isosorbide  mononitrate (IMDUR ) 30 MG 24 hr tablet Take 1 tablet (30 mg total) by mouth daily.  . Melatonin 10 MG TABS Take 10 mg by mouth at bedtime.  . meloxicam  (MOBIC ) 15 MG tablet Take 1 tablet (15 mg total) by mouth daily as needed for pain.  . methocarbamol  (ROBAXIN ) 500 MG tablet Take 1 tablet (500 mg total) by mouth every 8 (eight) hours as needed for muscle spasms.  . polyethylene glycol powder (GLYCOLAX /MIRALAX ) 17 GM/SCOOP powder Take 17 g by mouth daily.  . rosuvastatin  (CRESTOR ) 10 MG tablet Take 1 tablet (10 mg total) by mouth daily.  . sacubitril -valsartan  (ENTRESTO ) 97-103 MG Take 1 tablet by mouth 2 (two) times daily.  . spironolactone  (ALDACTONE ) 25 MG tablet Take 1 tablet (25 mg total) by mouth daily.  . traZODone  (DESYREL ) 50 MG tablet Take 1 tablet (50 mg total) by mouth at bedtime.   No facility-administered medications prior to visit.    ROS        Objective:     There were no vitals taken for this visit. {Vitals History (Optional):23777}  Physical Exam Vitals and nursing note reviewed.  Constitutional:      General: He is not in acute distress.    Appearance: Normal appearance.  He is obese.  HENT:     Head: Normocephalic and atraumatic.     Nose: Nose normal.     Mouth/Throat:     Mouth: Mucous membranes are moist.     Pharynx: Oropharynx is clear.  Eyes:     Extraocular Movements: Extraocular movements intact.  Cardiovascular:     Rate and Rhythm: Normal rate and regular rhythm.     Pulses: Normal pulses.     Heart sounds: Normal heart sounds.  Pulmonary:     Effort: Pulmonary effort is normal. No respiratory distress.     Breath sounds: Normal breath sounds. No stridor. No wheezing, rhonchi or rales.  Musculoskeletal:        General: Normal range of motion.     Cervical back: Normal range of motion.  Lymphadenopathy:      Cervical: No cervical adenopathy.  Skin:    General: Skin is warm and dry.     Comments: All 10 toenails thickened and discolored, R thumb and 1st finger nails thickened and discolored. No discharge or bleeding noted.  Neurological:     General: No focal deficit present.     Mental Status: He is alert and oriented to person, place, and time.  Psychiatric:        Mood and Affect: Mood normal.        Behavior: Behavior normal.     No results found for any visits on 09/11/23. {Show previous labs (optional):23779}     Assessment & Plan:    Routine Health Maintenance and Physical Exam   Health Maintenance  Topic Date Due  . COVID-19 Vaccine (1) Never done  . Yearly kidney health urinalysis for diabetes  Never done  . DTaP/Tdap/Td vaccine (1 - Tdap) Never done  . Pneumococcal Vaccination (1 of 2 - PCV) Never done  . Zoster (Shingles) Vaccine (1 of 2) Never done  . Flu Shot  12/01/2023  . Yearly kidney function blood test for diabetes  03/28/2024  . Colon Cancer Screening  06/18/2028  . Hepatitis C Screening  Completed  . HIV Screening  Completed  . HPV Vaccine  Aged Out  . Meningitis B Vaccine  Aged Out     Discussed health benefits of physical activity, and encouraged him to engage in regular exercise appropriate for his age and condition.  There are no diagnoses linked to this encounter.   No follow-ups on file.     Wellington Half, FNP

## 2023-09-11 ENCOUNTER — Encounter (HOSPITAL_COMMUNITY): Payer: Self-pay

## 2023-09-11 ENCOUNTER — Ambulatory Visit (INDEPENDENT_AMBULATORY_CARE_PROVIDER_SITE_OTHER): Admitting: Licensed Clinical Social Worker

## 2023-09-11 ENCOUNTER — Encounter: Payer: Self-pay | Admitting: Family Medicine

## 2023-09-11 ENCOUNTER — Ambulatory Visit: Payer: Medicaid Other | Admitting: Family Medicine

## 2023-09-11 VITALS — BP 140/88 | HR 63 | Temp 98.9°F | Ht 72.0 in | Wt 268.2 lb

## 2023-09-11 DIAGNOSIS — I10 Essential (primary) hypertension: Secondary | ICD-10-CM | POA: Diagnosis not present

## 2023-09-11 DIAGNOSIS — B351 Tinea unguium: Secondary | ICD-10-CM | POA: Diagnosis not present

## 2023-09-11 DIAGNOSIS — Z0001 Encounter for general adult medical examination with abnormal findings: Secondary | ICD-10-CM | POA: Diagnosis not present

## 2023-09-11 DIAGNOSIS — Z23 Encounter for immunization: Secondary | ICD-10-CM | POA: Diagnosis not present

## 2023-09-11 DIAGNOSIS — E66812 Obesity, class 2: Secondary | ICD-10-CM

## 2023-09-11 DIAGNOSIS — F411 Generalized anxiety disorder: Secondary | ICD-10-CM | POA: Diagnosis not present

## 2023-09-11 DIAGNOSIS — F331 Major depressive disorder, recurrent, moderate: Secondary | ICD-10-CM | POA: Diagnosis not present

## 2023-09-11 DIAGNOSIS — Z6838 Body mass index (BMI) 38.0-38.9, adult: Secondary | ICD-10-CM | POA: Diagnosis not present

## 2023-09-11 DIAGNOSIS — Z125 Encounter for screening for malignant neoplasm of prostate: Secondary | ICD-10-CM | POA: Diagnosis not present

## 2023-09-11 DIAGNOSIS — I429 Cardiomyopathy, unspecified: Secondary | ICD-10-CM

## 2023-09-11 DIAGNOSIS — F1411 Cocaine abuse, in remission: Secondary | ICD-10-CM

## 2023-09-11 LAB — COMPREHENSIVE METABOLIC PANEL WITH GFR
ALT: 26 U/L (ref 0–53)
AST: 21 U/L (ref 0–37)
Albumin: 4.1 g/dL (ref 3.5–5.2)
Alkaline Phosphatase: 65 U/L (ref 39–117)
BUN: 17 mg/dL (ref 6–23)
CO2: 27 meq/L (ref 19–32)
Calcium: 9.6 mg/dL (ref 8.4–10.5)
Chloride: 104 meq/L (ref 96–112)
Creatinine, Ser: 1.29 mg/dL (ref 0.40–1.50)
GFR: 63.99 mL/min (ref >60.00)
Glucose, Bld: 91 mg/dL (ref 70–99)
Potassium: 3.8 meq/L (ref 3.5–5.1)
Sodium: 140 meq/L (ref 135–145)
Total Bilirubin: 0.5 mg/dL (ref 0.2–1.2)
Total Protein: 7.1 g/dL (ref 6.0–8.3)

## 2023-09-11 LAB — CBC WITH DIFFERENTIAL/PLATELET
Basophils Absolute: 0.1 10*3/uL (ref 0.0–0.1)
Basophils Relative: 0.8 % (ref 0.0–3.0)
Eosinophils Absolute: 0.2 10*3/uL (ref 0.0–0.7)
Eosinophils Relative: 2.4 % (ref 0.0–5.0)
HCT: 41.7 % (ref 39.0–52.0)
Hemoglobin: 13.9 g/dL (ref 13.0–17.0)
Lymphocytes Relative: 30.9 % (ref 12.0–46.0)
Lymphs Abs: 2 10*3/uL (ref 0.7–4.0)
MCHC: 33.3 g/dL (ref 30.0–36.0)
MCV: 90.9 fl (ref 78.0–100.0)
Monocytes Absolute: 0.6 10*3/uL (ref 0.1–1.0)
Monocytes Relative: 8.5 % (ref 3.0–12.0)
Neutro Abs: 3.7 10*3/uL (ref 1.4–7.7)
Neutrophils Relative %: 57.4 % (ref 43.0–77.0)
Platelets: 300 10*3/uL (ref 150.0–400.0)
RBC: 4.59 Mil/uL (ref 4.22–5.81)
RDW: 13.8 % (ref 11.5–15.5)
WBC: 6.5 10*3/uL (ref 4.0–10.5)

## 2023-09-11 LAB — LIPID PANEL
Cholesterol: 154 mg/dL (ref 0–200)
HDL: 38 mg/dL — ABNORMAL LOW (ref >39.00)
LDL Cholesterol: 93 mg/dL (ref 0–99)
NonHDL: 116.02
Total CHOL/HDL Ratio: 4
Triglycerides: 116 mg/dL (ref 0.0–149.0)
VLDL: 23.2 mg/dL (ref 0.0–40.0)

## 2023-09-11 LAB — PSA: PSA: 0.54 ng/mL (ref 0.10–4.00)

## 2023-09-11 LAB — VITAMIN B12: Vitamin B-12: 308 pg/mL (ref 211–911)

## 2023-09-11 LAB — VITAMIN D 25 HYDROXY (VIT D DEFICIENCY, FRACTURES): VITD: 10.84 ng/mL — ABNORMAL LOW (ref 30.00–100.00)

## 2023-09-11 LAB — HEMOGLOBIN A1C: Hgb A1c MFr Bld: 6.3 % (ref 4.6–6.5)

## 2023-09-11 LAB — TSH: TSH: 1.2 u[IU]/mL (ref 0.35–5.50)

## 2023-09-11 NOTE — Patient Instructions (Addendum)
 We have completed your physical today.   We are checking labs today, will be in contact with any results that require further attention  [facility] [address] [place], [place] [address] [phone]  Dr Janene Medici [address] [place], [place] [address] [phone]  [facility] [address] [place], [place] [address] [phone]   [facility] [address] [place], [place] [address] [phone]  [facility] [address] [place], [place] [address] [phone]  [facility] [address] [place], [place] [address] [phone]  [facility] [address] [place], [place] [address] [phone]   We have given your first shingles vaccine today. Follow up with us  in about 4 months for your second dose.  I have sent in a referral to podiatry for further evaluation of your nails. Someone will be reaching out to get you scheduled.   I would encourage you to take your blood pressure medications on time as prescribed.  Follow-up with cardiology as scheduled.  Follow up with me in one year for physical, sooner if needed.

## 2023-09-11 NOTE — Assessment & Plan Note (Signed)
 First dose of shingles vaccine given today, VIS printed and given to patient Discussed to follow-up for second immunization in about 4 months

## 2023-09-11 NOTE — Assessment & Plan Note (Signed)
 Discussed to continue walking for exercise, discussed to continue current efforts and healthy diet and activity level

## 2023-09-11 NOTE — Assessment & Plan Note (Signed)
PSA screen today

## 2023-09-11 NOTE — Assessment & Plan Note (Signed)
 Stable, continue trazodone , continue follow-up with psychotherapy

## 2023-09-11 NOTE — Assessment & Plan Note (Signed)
 Failed ciclopirox  Referral to podiatry today

## 2023-09-11 NOTE — Assessment & Plan Note (Signed)
 Typically controlled, has not taken medications today Counseled on importance of medication compliance, especially given cardiac history Typically managed by Dr. Abelina Hoes with cardiology, he is to have follow-up in the next month with him

## 2023-09-11 NOTE — Assessment & Plan Note (Signed)
 Stable, continue trazodone , continue to follow-up with psych and CBT.

## 2023-09-11 NOTE — Assessment & Plan Note (Signed)
 Continue aspirin , carvedilol , Farxiga , hydralazine , Imdur , Entresto , spironolactone  Follow-up with cardiology as scheduled, stable

## 2023-09-11 NOTE — Progress Notes (Signed)
   THERAPIST PROGRESS NOTE  Session Time: 60 minutes  Participation Level: {BHH PARTICIPATION LEVEL:22264}  Behavioral Response: {Appearance:22683}{BHH LEVEL OF CONSCIOUSNESS:22305}{BHH MOOD:22306}  Type of Therapy: {CHL AMB BH Type of Therapy:21022741}  Treatment Goals addressed: ***  ProgressTowards Goals: {Progress Towards Goals:21014066}  Interventions: {CHL AMB BH Type of Intervention:21022753}  Summary: Ronald Young is a 53 y.o. male who presents with ***.   Suicidal/Homicidal: {BHH YES OR NO:22294}{yes/no/with/without intent/plan:22693}  Therapist Response: ***  Plan: Return again in *** weeks.  Diagnosis: Generalized anxiety disorder  Collaboration of Care: {BH OP Collaboration of Care:21014065}  Patient/Guardian was advised Release of Information must be obtained prior to any record release in order to collaborate their care with an outside provider. Patient/Guardian was advised if they have not already done so to contact the registration department to sign all necessary forms in order for us  to release information regarding their care.   Consent: Patient/Guardian gives verbal consent for treatment and assignment of benefits for services provided during this visit. Patient/Guardian expressed understanding and agreed to proceed.   Sheilda Deputy, LCSW 09/11/2023

## 2023-09-12 ENCOUNTER — Other Ambulatory Visit: Payer: Self-pay

## 2023-09-12 ENCOUNTER — Encounter (HOSPITAL_COMMUNITY): Payer: Self-pay | Admitting: Emergency Medicine

## 2023-09-12 ENCOUNTER — Emergency Department (HOSPITAL_COMMUNITY)

## 2023-09-12 ENCOUNTER — Emergency Department (HOSPITAL_COMMUNITY)
Admission: EM | Admit: 2023-09-12 | Discharge: 2023-09-13 | Disposition: A | Discharge status: Home / Self Care | Attending: Emergency Medicine | Admitting: Emergency Medicine

## 2023-09-12 DIAGNOSIS — Z79899 Other long term (current) drug therapy: Secondary | ICD-10-CM | POA: Diagnosis not present

## 2023-09-12 DIAGNOSIS — M549 Dorsalgia, unspecified: Secondary | ICD-10-CM | POA: Insufficient documentation

## 2023-09-12 DIAGNOSIS — R0602 Shortness of breath: Secondary | ICD-10-CM | POA: Insufficient documentation

## 2023-09-12 DIAGNOSIS — R079 Chest pain, unspecified: Secondary | ICD-10-CM | POA: Insufficient documentation

## 2023-09-12 DIAGNOSIS — R0989 Other specified symptoms and signs involving the circulatory and respiratory systems: Secondary | ICD-10-CM | POA: Diagnosis not present

## 2023-09-12 DIAGNOSIS — I509 Heart failure, unspecified: Secondary | ICD-10-CM | POA: Insufficient documentation

## 2023-09-12 DIAGNOSIS — R0789 Other chest pain: Secondary | ICD-10-CM | POA: Diagnosis not present

## 2023-09-12 DIAGNOSIS — R61 Generalized hyperhidrosis: Secondary | ICD-10-CM | POA: Diagnosis not present

## 2023-09-12 DIAGNOSIS — I110 Hypertensive heart disease with heart failure: Secondary | ICD-10-CM | POA: Insufficient documentation

## 2023-09-12 LAB — BASIC METABOLIC PANEL WITH GFR
Anion gap: 7 (ref 5–15)
BUN: 16 mg/dL (ref 6–20)
CO2: 25 mmol/L (ref 22–32)
Calcium: 9.4 mg/dL (ref 8.9–10.3)
Chloride: 106 mmol/L (ref 98–111)
Creatinine, Ser: 1.16 mg/dL (ref 0.61–1.24)
GFR, Estimated: >60 mL/min (ref >60)
Glucose, Bld: 104 mg/dL — ABNORMAL HIGH (ref 70–99)
Potassium: 3.5 mmol/L (ref 3.5–5.1)
Sodium: 138 mmol/L (ref 135–145)

## 2023-09-12 LAB — CBC
HCT: 42.1 % (ref 39.0–52.0)
Hemoglobin: 13.7 g/dL (ref 13.0–17.0)
MCH: 29.8 pg (ref 26.0–34.0)
MCHC: 32.5 g/dL (ref 30.0–36.0)
MCV: 91.5 fL (ref 80.0–100.0)
Platelets: 286 10*3/uL (ref 150–400)
RBC: 4.6 MIL/uL (ref 4.22–5.81)
RDW: 13 % (ref 11.5–15.5)
WBC: 7.7 10*3/uL (ref 4.0–10.5)
nRBC: 0 % (ref 0.0–0.2)

## 2023-09-12 LAB — TROPONIN I (HIGH SENSITIVITY): Troponin I (High Sensitivity): 26 ng/L — ABNORMAL HIGH (ref <18)

## 2023-09-12 MED ORDER — ASPIRIN 81 MG PO CHEW
324.0000 mg | CHEWABLE_TABLET | Freq: Once | ORAL | Status: AC
  Administered 2023-09-12: 324 mg via ORAL
  Filled 2023-09-12 (×2): qty 4

## 2023-09-12 NOTE — ED Provider Notes (Signed)
 [place] EMERGENCY DEPARTMENT AT [facility] Provider Note   CSN: [id number] Arrival date & time: 09/12/23  2049     History {Add pertinent medical, surgical, social history, OB history to HPI:1} Chief Complaint  Patient presents with   Chest Pain    Ronald Young is a 53 y.o. male.   Chest Pain  Patient has history of hypertensive urgency acute renal failure cardiomyopathy, insomnia, anxiety  Pt has been having aching pain in the chest since yesterday.  Constant pressure, waxing ang waning.  Radiates to should jaw.  Some diaphoresis and shortness of breath.  No leg swelling.  No shortness of breath currently.  Patient states he had a shingles shot the other day.  He wonders if that could be contributing to some of his symptoms.  He is also had some aches and pains in his back.  Home Medications Prior to Admission medications   Medication Sig Start Date End Date Taking? Authorizing Provider  aspirin  81 MG chewable tablet Chew 1 tablet (81 mg total) by mouth daily. 11/28/20   Camellia Caves, MD  carvedilol  (COREG ) 25 MG tablet Take 1.5 tablets (37.5 mg total) by mouth 2 (two) times daily with a meal. 08/09/23   Wendie Hamburg, MD  cetirizine  (ZYRTEC ) 10 MG tablet Take 1 tablet (10 mg total) by mouth daily. 08/29/22   Newlin, Enobong, MD  dapagliflozin  propanediol (FARXIGA ) 10 MG TABS tablet Take 1 tablet (10 mg total) by mouth daily. 02/17/23   Wendie Hamburg, MD  diclofenac  Sodium (VOLTAREN ) 1 % GEL Apply 2 g topically 4 (four) times daily. 10/13/20   Darlis Eisenmenger, PA-C  dicyclomine  (BENTYL ) 10 MG capsule Take 1 capsule (10 mg total) by mouth 2 (two) times daily as needed for spasms (diarrhea). 08/29/22   Newlin, Enobong, MD  escitalopram  (LEXAPRO ) 20 MG tablet Take 1 tablet (20 mg total) by mouth daily. 09/23/21 05/30/23  Hassie Lint, PA-C  fluticasone  (FLONASE ) 50 MCG/ACT nasal spray Place 2 sprays into both nostrils daily. 04/12/23   Wellington Half, FNP  hydrALAZINE  (APRESOLINE ) 50 MG tablet Take 2 tablets (100 mg total) by mouth 3 (three) times daily. 08/09/23   Wendie Hamburg, MD  isosorbide  mononitrate (IMDUR ) 30 MG 24 hr tablet Take 1 tablet (30 mg total) by mouth daily. 08/09/23   Wendie Hamburg, MD  Melatonin 10 MG TABS Take 10 mg by mouth at bedtime. 07/28/21   Nwoko, Uchenna E, PA  meloxicam  (MOBIC ) 15 MG tablet Take 1 tablet (15 mg total) by mouth daily as needed for pain. 01/11/22   Adah Acron, MD  methocarbamol  (ROBAXIN ) 500 MG tablet Take 1 tablet (500 mg total) by mouth every 8 (eight) hours as needed for muscle spasms. 01/11/22   Adah Acron, MD  polyethylene glycol powder (GLYCOLAX /MIRALAX ) 17 GM/SCOOP powder Take 17 g by mouth daily. 08/29/22   Newlin, Enobong, MD  rosuvastatin  (CRESTOR ) 10 MG tablet Take 1 tablet (10 mg total) by mouth daily. 08/09/23   Wendie Hamburg, MD  sacubitril -valsartan  (ENTRESTO ) 97-103 MG Take 1 tablet by mouth 2 (two) times daily. 12/30/22   Wendie Hamburg, MD  spironolactone  (ALDACTONE ) 25 MG tablet Take 1 tablet (25 mg total) by mouth daily. 08/09/23   Wendie Hamburg, MD  traZODone  (DESYREL ) 50 MG tablet Take 1 tablet (50 mg total) by mouth at bedtime. 09/23/21   McClung, Angela M, PA-C      Allergies  Strawberry extract    Review of Systems   Review of Systems  Cardiovascular:  Positive for chest pain.    Physical Exam Updated Vital Signs BP (!) 135/90 (BP Location: Left Arm)   Pulse 60   Temp 98.9 F (37.2 C) (Oral)   Resp 18   SpO2 97%  Physical Exam Vitals and nursing note reviewed.  Constitutional:      General: He is not in acute distress.    Appearance: He is well-developed.  HENT:     Head: Normocephalic and atraumatic.     Right Ear: External ear normal.     Left Ear: External ear normal.  Eyes:     General: No scleral icterus.       Right eye: No discharge.        Left eye: No discharge.     Conjunctiva/sclera:  Conjunctivae normal.  Neck:     Trachea: No tracheal deviation.  Cardiovascular:     Rate and Rhythm: Normal rate and regular rhythm.  Pulmonary:     Effort: Pulmonary effort is normal. No respiratory distress.     Breath sounds: Normal breath sounds. No stridor. No wheezing or rales.  Abdominal:     General: Bowel sounds are normal. There is no distension.     Palpations: Abdomen is soft.     Tenderness: There is no abdominal tenderness. There is no guarding or rebound.  Musculoskeletal:        General: No tenderness or deformity.     Cervical back: Neck supple.  Skin:    General: Skin is warm and dry.     Findings: No rash.  Neurological:     General: No focal deficit present.     Mental Status: He is alert.     Cranial Nerves: No cranial nerve deficit, dysarthria or facial asymmetry.     Sensory: No sensory deficit.     Motor: No abnormal muscle tone or seizure activity.     Coordination: Coordination normal.  Psychiatric:        Mood and Affect: Mood normal.     ED Results / Procedures / Treatments   Labs (all labs ordered are listed, but only abnormal results are displayed) Labs Reviewed  BASIC METABOLIC PANEL WITH GFR - Abnormal; Notable for the following components:      Result Value   Glucose, Bld 104 (*)    All other components within normal limits  TROPONIN I (HIGH SENSITIVITY) - Abnormal; Notable for the following components:   Troponin I (High Sensitivity) 26 (*)    All other components within normal limits  CBC  TROPONIN I (HIGH SENSITIVITY)    EKG EKG Interpretation Date/Time:  Tuesday Sep 12 2023 21:02:32 EDT Ventricular Rate:  63 PR Interval:  243 QRS Duration:  95 QT Interval:  409 QTC Calculation: 419 R Axis:   -1  Text Interpretation: Sinus rhythm Prolonged PR interval Anterior infarct, old No significant change since last tracing Confirmed by Trish Furl [phone]) on 09/12/2023 10:47:10 PM  Radiology DG Chest 2 View Result Date:  09/12/2023 CLINICAL DATA:  [id number] Chest pain [id number] Pt reports intermittent centralized chest pain since yesterday. Pain described as aching, radiates to back and left jaw/shoulder. Has broken out into a sweat when CP occurs and reports worsening SOB. Sts this SOB is worse EXAM: CHEST - 2 VIEW COMPARISON:  Chest x-ray 12/20/2019 FINDINGS: The heart and mediastinal contours are unchanged. Prominent hilar vasculature. No focal consolidation. No pulmonary edema. No  pleural effusion. No pneumothorax. No acute osseous abnormality. IMPRESSION: Pulmonary venous congestion. Electronically Signed   By: Morgane  Naveau M.D.   On: 09/12/2023 21:50    Procedures Procedures  {Document cardiac monitor, telemetry assessment procedure when appropriate:1}  Medications Ordered in ED Medications - No data to display  ED Course/ Medical Decision Making/ A&P   {   Click here for ABCD2, HEART and other calculatorsREFRESH Note before signing :1}                              Medical Decision Making Amount and/or Complexity of Data Reviewed Labs: ordered.  Risk OTC drugs.   ***  {Document critical care time when appropriate:1} {Document review of labs and clinical decision tools ie heart score, Chads2Vasc2 etc:1}  {Document your independent review of radiology images, and any outside records:1} {Document your discussion with family members, caretakers, and with consultants:1} {Document social determinants of health affecting pt's care:1} {Document your decision making why or why not admission, treatments were needed:1} Final Clinical Impression(s) / ED Diagnoses Final diagnoses:  None    Rx / DC Orders ED Discharge Orders     None

## 2023-09-12 NOTE — BH OP Treatment Plan (Signed)
 Active     Anxiety     LTG: Elaine will score less than 5 on the Generalized Anxiety Disorder 7 Scale (GAD-7)  (Initial)     Start:  09/11/23    Expected End:  09/10/24         STG: Ryne will participate in at least 80% of scheduled individual psychotherapy sessions  (Initial)     Start:  09/11/23    Expected End:  09/10/24         Work with Binyamin to identify 3 personal goals for managing their anxiety to work on during current treatment.      Start:  09/11/23           BH CCP Acute or Chronic Trauma Reaction     LTG: Elimination of maladaptive behaviors and thinking patterns which interfere with resolution of trauma as evidenced by self-report (Initial)     Start:  09/11/23    Expected End:  09/10/24         STG: Stetson will experience a 50% reduction in exaggerated beliefs about self and others that interfere with trauma resolution as evidenced by self-report (Initial)     Start:  09/11/23    Expected End:  09/10/24         STG: Emmitt will acknowledge that healing from PTSD is a gradual process (Initial)     Start:  09/11/23    Expected End:  09/10/24         STG: Sem will identify negative coping strategies that have been used to cope with the feelings associated with the trauma (Initial)     Start:  09/11/23    Expected End:  09/10/24         Ask Aayansh to identify what parts of his/her conscious memories are the most distressing and act as triggers for stress symptoms     Start:  09/11/23         Educate Carlyle on common reactions to a traumatic experience     Start:  09/11/23         Educate Dekota that exposure to trauma may result in brain and hormonal changes that can lead to difficulties with memory, learning, emotional regulation, poor impulse control, or depression that can persist     Start:  09/11/23         Robson will verbalize an understanding of the fact that recurring memories of trauma rarely cease completely and that  coping with them is a lifelong process     Start:  09/11/23         Educate Hien on trauma influenced cognitive distortions     Start:  09/11/23         Encourage Torrion to identify at least 3 trauma related cognitive distortions     Start:  09/11/23

## 2023-09-12 NOTE — ED Triage Notes (Signed)
 Pt reports intermittent centralized chest pain since yesterday. Pain described as aching, radiates to back and left jaw/shoulder. Has broken out into a sweat when CP occurs and reports worsening SOB. Sts this SOB is worse than his typical SOB related to his CHF. Denies any execrating factors.

## 2023-09-13 ENCOUNTER — Ambulatory Visit: Payer: Self-pay | Admitting: Family Medicine

## 2023-09-13 DIAGNOSIS — E559 Vitamin D deficiency, unspecified: Secondary | ICD-10-CM

## 2023-09-13 LAB — TROPONIN I (HIGH SENSITIVITY): Troponin I (High Sensitivity): 22 ng/L — ABNORMAL HIGH (ref <18)

## 2023-09-13 MED ORDER — VITAMIN D (ERGOCALCIFEROL) 1.25 MG (50000 UNIT) PO CAPS: 50000.0000 [IU] | ORAL_CAPSULE | ORAL | 0 refills | Status: AC

## 2023-09-20 ENCOUNTER — Encounter (HOSPITAL_COMMUNITY): Payer: Self-pay | Admitting: Emergency Medicine

## 2023-09-20 ENCOUNTER — Ambulatory Visit (HOSPITAL_COMMUNITY)
Admission: EM | Admit: 2023-09-20 | Discharge: 2023-09-20 | Disposition: A | Discharge status: Home / Self Care | Attending: Internal Medicine | Admitting: Internal Medicine

## 2023-09-20 ENCOUNTER — Ambulatory Visit (INDEPENDENT_AMBULATORY_CARE_PROVIDER_SITE_OTHER)

## 2023-09-20 DIAGNOSIS — M25562 Pain in left knee: Secondary | ICD-10-CM

## 2023-09-20 DIAGNOSIS — M1711 Unilateral primary osteoarthritis, right knee: Secondary | ICD-10-CM | POA: Diagnosis not present

## 2023-09-20 DIAGNOSIS — M545 Low back pain, unspecified: Secondary | ICD-10-CM

## 2023-09-20 DIAGNOSIS — M25561 Pain in right knee: Secondary | ICD-10-CM

## 2023-09-20 DIAGNOSIS — M5431 Sciatica, right side: Secondary | ICD-10-CM | POA: Diagnosis not present

## 2023-09-20 DIAGNOSIS — M47817 Spondylosis without myelopathy or radiculopathy, lumbosacral region: Secondary | ICD-10-CM | POA: Diagnosis not present

## 2023-09-20 DIAGNOSIS — M1712 Unilateral primary osteoarthritis, left knee: Secondary | ICD-10-CM | POA: Diagnosis not present

## 2023-09-20 DIAGNOSIS — G8929 Other chronic pain: Secondary | ICD-10-CM | POA: Diagnosis not present

## 2023-09-20 DIAGNOSIS — M5432 Sciatica, left side: Secondary | ICD-10-CM

## 2023-09-20 MED ORDER — KETOROLAC TROMETHAMINE 30 MG/ML IJ SOLN
INTRAMUSCULAR | Status: AC
Start: 2023-09-20 — End: ?
  Filled 2023-09-20: qty 1

## 2023-09-20 MED ORDER — CYCLOBENZAPRINE HCL 5 MG PO TABS: 5.0000 mg | ORAL_TABLET | Freq: Three times a day (TID) | ORAL | 0 refills | Status: AC | PRN

## 2023-09-20 MED ORDER — PREDNISONE 20 MG PO TABS: 40.0000 mg | ORAL_TABLET | Freq: Every day | ORAL | 0 refills | Status: AC

## 2023-09-20 MED ORDER — DEXAMETHASONE SODIUM PHOSPHATE 10 MG/ML IJ SOLN
10.0000 mg | Freq: Once | INTRAMUSCULAR | Status: AC
  Administered 2023-09-20: 10 mg via INTRAMUSCULAR

## 2023-09-20 MED ORDER — KETOROLAC TROMETHAMINE 30 MG/ML IJ SOLN
15.0000 mg | Freq: Once | INTRAMUSCULAR | Status: AC
  Administered 2023-09-20: 15 mg via INTRAMUSCULAR

## 2023-09-20 MED ORDER — DEXAMETHASONE SODIUM PHOSPHATE 10 MG/ML IJ SOLN
INTRAMUSCULAR | Status: AC
  Filled 2023-09-20: qty 1

## 2023-09-20 NOTE — ED Provider Notes (Signed)
 [facility]    CSN: [id number] Arrival date & time: 09/20/23  1629      History   Chief Complaint Chief Complaint  Patient presents with   Knee Pain   Back Pain    HPI Ronald Young is a 53 y.o. male.   53 year old male who presents urgent care with complaints of back pain with radiating down both legs and bilateral knee pain.  He reports that these are separate issues.  1. Back pain with radiating down both legs: This has been going on for "years" however about a month ago it got much worse.  He denies any recent injury to the area although he had an injury to his back in 2006.  He reports the pain is in the mid lower back and seems to be along the spine.  He does have some pain into the muscles as well.  He denies any bowel or bladder incontinence.  He has some weakness in his legs that is chronic.  He denies any dysuria, hematuria, fevers, chills, numbness, tingling.  He has never seen an orthopedist for his back and has not had any evaluation per the patient.  2. Knees: He reports that he has been having pain in both his knees since 2000 01/2010.  It started in his left knee and then progressed to his right.  His right knee is the worst now.  Within the last month his knees have both become much more painful but especially the right.  The right knee is locking at times and giving help.  He has not had an injury recently or in the past.  He reports that the pain in both knees especially along the joint lines medial and lateral.  They do swell at times especially on the right.  He takes Tylenol  which does help.  He has not had any braces but does wear copper fit compression sleeves every once in a while.  He has never seen an orthopedist for his knees.    Knee Pain Associated symptoms: back pain (Mid lumbar)   Associated symptoms: no fever   Back Pain Associated symptoms: no abdominal pain, no chest pain, no dysuria and no fever     Past Medical History:  Diagnosis  Date   Arthritis    Cardiomyopathy (HCC) 09/22/2014   History of cocaine abuse (HCC) 04/12/2023   Hypertension     Patient Active Problem List   Diagnosis Date Noted   Encounter for administration of vaccine 09/11/2023   Screening for prostate cancer 09/11/2023   Onychomycosis 09/11/2023   Encounter for immunization 09/11/2023   Class 2 severe obesity due to excess calories with serious comorbidity and body mass index (BMI) of 38.0 to 38.9 in adult (HCC) 04/12/2023   Adjustment disorder with mixed anxiety and depressed mood 08/18/2021   Moderate episode of recurrent major depressive disorder (HCC) 04/13/2021   Generalized anxiety disorder 04/13/2021   Insomnia 04/13/2021   Chronic sinusitis 04/06/2021   Acute on chronic combined systolic and diastolic CHF (congestive heart failure) (HCC) 12/20/2019   Essential hypertension 09/26/2014   Bradycardia 09/26/2014   Cocaine use 09/23/2014   Cardiomyopathy (HCC) 09/22/2014   Hypertensive urgency 09/21/2014   Syncope 09/21/2014   Hypertensive emergency 09/21/2014   ARF (acute renal failure) (HCC) 09/21/2014   Elevated troponin     Past Surgical History:  Procedure Laterality Date   RIGHT/LEFT HEART CATH AND CORONARY ANGIOGRAPHY N/A 12/23/2019   Procedure: RIGHT/LEFT HEART CATH AND CORONARY ANGIOGRAPHY;  Surgeon: Avanell Leigh, MD;  Location: [facility] INVASIVE CV LAB;  Service: Cardiovascular;  Laterality: N/A;       Home Medications    Prior to Admission medications   Medication Sig Start Date End Date Taking? Authorizing Provider  cyclobenzaprine (FLEXERIL) 5 MG tablet Take 1 tablet (5 mg total) by mouth every 8 (eight) hours as needed for muscle spasms. 09/20/23  Yes Dalilah Curlin A, PA-C  predniSONE  (DELTASONE ) 20 MG tablet Take 2 tablets (40 mg total) by mouth daily with breakfast for 5 days. 09/20/23 09/25/23 Yes Kortne All A, PA-C  aspirin  81 MG chewable tablet Chew 1 tablet (81 mg total) by mouth daily. 11/28/20    Camellia Caves, MD  carvedilol  (COREG ) 25 MG tablet Take 1.5 tablets (37.5 mg total) by mouth 2 (two) times daily with a meal. 08/09/23   Wendie Hamburg, MD  cetirizine  (ZYRTEC ) 10 MG tablet Take 1 tablet (10 mg total) by mouth daily. 08/29/22   Newlin, Enobong, MD  dapagliflozin  propanediol (FARXIGA ) 10 MG TABS tablet Take 1 tablet (10 mg total) by mouth daily. 02/17/23   Wendie Hamburg, MD  diclofenac  Sodium (VOLTAREN ) 1 % GEL Apply 2 g topically 4 (four) times daily. 10/13/20   Darlis Eisenmenger, PA-C  dicyclomine  (BENTYL ) 10 MG capsule Take 1 capsule (10 mg total) by mouth 2 (two) times daily as needed for spasms (diarrhea). 08/29/22   Newlin, Enobong, MD  escitalopram  (LEXAPRO ) 20 MG tablet Take 1 tablet (20 mg total) by mouth daily. 09/23/21 05/30/23  Hassie Lint, PA-C  fluticasone  (FLONASE ) 50 MCG/ACT nasal spray Place 2 sprays into both nostrils daily. 04/12/23   Wellington Half, FNP  hydrALAZINE  (APRESOLINE ) 50 MG tablet Take 2 tablets (100 mg total) by mouth 3 (three) times daily. 08/09/23   Wendie Hamburg, MD  isosorbide  mononitrate (IMDUR ) 30 MG 24 hr tablet Take 1 tablet (30 mg total) by mouth daily. 08/09/23   Wendie Hamburg, MD  Melatonin 10 MG TABS Take 10 mg by mouth at bedtime. 07/28/21   Nwoko, Uchenna E, PA  meloxicam  (MOBIC ) 15 MG tablet Take 1 tablet (15 mg total) by mouth daily as needed for pain. 01/11/22   Adah Acron, MD  polyethylene glycol powder (GLYCOLAX /MIRALAX ) 17 GM/SCOOP powder Take 17 g by mouth daily. 08/29/22   Newlin, Enobong, MD  rosuvastatin  (CRESTOR ) 10 MG tablet Take 1 tablet (10 mg total) by mouth daily. 08/09/23   Wendie Hamburg, MD  sacubitril -valsartan  (ENTRESTO ) 97-103 MG Take 1 tablet by mouth 2 (two) times daily. 12/30/22   Wendie Hamburg, MD  spironolactone  (ALDACTONE ) 25 MG tablet Take 1 tablet (25 mg total) by mouth daily. 08/09/23   Wendie Hamburg, MD  traZODone  (DESYREL ) 50 MG tablet Take  1 tablet (50 mg total) by mouth at bedtime. 09/23/21   Hassie Lint, PA-C  Vitamin D , Ergocalciferol , (DRISDOL ) 1.25 MG (50000 UNIT) CAPS capsule Take 1 capsule (50,000 Units total) by mouth every 7 (seven) days. 09/13/23   Wellington Half, FNP    Family History Family History  Problem Relation Age of Onset   Hypertension Mother    Colon cancer Maternal Grandmother    Rectal cancer Neg Hx    Stomach cancer Neg Hx    Esophageal cancer Neg Hx     Social History Social History   Tobacco Use   Smoking status: Former    Types: Cigarettes   Smokeless tobacco: Never  Vaping Use  Vaping status: Never Used  Substance Use Topics   Alcohol use: Not Currently   Drug use: Yes    Types: Marijuana     Allergies   Strawberry extract   Review of Systems Review of Systems  Constitutional:  Negative for chills and fever.  HENT:  Negative for ear pain and sore throat.   Eyes:  Negative for pain and visual disturbance.  Respiratory:  Negative for cough and shortness of breath.   Cardiovascular:  Negative for chest pain and palpitations.  Gastrointestinal:  Negative for abdominal pain and vomiting.  Genitourinary:  Negative for dysuria, frequency, hematuria and urgency.  Musculoskeletal:  Positive for back pain (Mid lumbar). Negative for arthralgias.       Bilateral knee pain and swelling  Skin:  Negative for color change and rash.  Neurological:  Negative for seizures and syncope.  All other systems reviewed and are negative.    Physical Exam Triage Vital Signs ED Triage Vitals  Encounter Vitals Group     BP 09/20/23 1754 116/77     Systolic BP Percentile --      Diastolic BP Percentile --      Pulse Rate 09/20/23 1754 (!) 56     Resp 09/20/23 1754 16     Temp 09/20/23 1754 99.1 F (37.3 C)     Temp src --      SpO2 09/20/23 1754 96 %     Weight --      Height --      Head Circumference --      Peak Flow --      Pain Score 09/20/23 1753 8     Pain Loc --       Pain Education --      Exclude from Growth Chart --    No data found.  Updated Vital Signs BP 116/77 (BP Location: Left Arm)   Pulse (!) 56   Temp 99.1 F (37.3 C)   Resp 16   SpO2 96%   Visual Acuity Right Eye Distance:   Left Eye Distance:   Bilateral Distance:    Right Eye Near:   Left Eye Near:    Bilateral Near:     Physical Exam Vitals and nursing note reviewed.  Constitutional:      General: He is not in acute distress.    Appearance: He is well-developed.  HENT:     Head: Normocephalic and atraumatic.  Eyes:     Conjunctiva/sclera: Conjunctivae normal.  Cardiovascular:     Rate and Rhythm: Normal rate and regular rhythm.     Heart sounds: No murmur heard. Pulmonary:     Effort: Pulmonary effort is normal. No respiratory distress.     Breath sounds: Normal breath sounds.  Abdominal:     Palpations: Abdomen is soft.     Tenderness: There is no abdominal tenderness.  Musculoskeletal:        General: No swelling.     Cervical back: Neck supple.     Thoracic back: Normal.     Lumbar back: Spasms, tenderness and bony tenderness (Along lumbar vertebrae) present. No swelling. Decreased range of motion (With lying down and sitting up). Negative right straight leg raise test and negative left straight leg raise test.     Right knee: Swelling (Trace) and crepitus present. No deformity. Normal range of motion. Tenderness present over the medial joint line, lateral joint line and patellar tendon. No LCL laxity, MCL laxity, ACL laxity or PCL laxity. Normal  pulse.     Instability Tests: Anterior drawer test negative. Posterior drawer test negative. Medial McMurray test negative and lateral McMurray test negative.     Left knee: Crepitus present. No swelling or deformity. Normal range of motion. Tenderness present over the medial joint line, lateral joint line and patellar tendon. No LCL laxity, MCL laxity, ACL laxity or PCL laxity.Normal pulse.     Instability Tests:  Anterior drawer test negative. Posterior drawer test negative. Medial McMurray test negative and lateral McMurray test negative.     Comments: Especially tender along the medial joint line bilateral  Skin:    General: Skin is warm and dry.     Capillary Refill: Capillary refill takes less than 2 seconds.  Neurological:     Mental Status: He is alert.  Psychiatric:        Mood and Affect: Mood normal.      UC Treatments / Results  Labs (all labs ordered are listed, but only abnormal results are displayed) Labs Reviewed - No data to display  EKG   Radiology No results found.  Procedures Procedures (including critical care time)  Medications Ordered in UC Medications  ketorolac (TORADOL) 30 MG/ML injection 15 mg (has no administration in time range)  dexamethasone (DECADRON) injection 10 mg (has no administration in time range)    Initial Impression / Assessment and Plan / UC Course  I have reviewed the triage vital signs and the nursing notes.  Pertinent labs & imaging results that were available during my care of the patient were reviewed by me and considered in my medical decision making (see chart for details).     Lumbar back pain - Plan: DG Lumbar Spine Complete, DG Lumbar Spine Complete  Acute pain of left knee - Plan: DG Knee Complete 4 Views Left, DG Knee Complete 4 Views Left  Acute pain of right knee - Plan: DG Knee Complete 4 Views Right, DG Knee Complete 4 Views Right  Bilateral sciatica  X-ray of the lumbar back, left knee and right knee done today.  Final evaluation by the radiologist is still pending but on brief evaluation there does not appear to be any acute changes however there are some changes consistent with arthritis in the lumbar spine as well as both knees.  Exacerbation of the pain may be secondary to inflammation. Reassuringly, there are no concerning neurological symptoms but there is pain present. We can treat with the following and recommend  following up with orthopedics for your knees and lumbar spine:  Toradol injection given today. This is a medication to help with pain. This is not a narcotic.  Decadron injection given today. This is a steroid to help with inflammation and pain. Prednisone  40 mg (2 tablets) once daily for 5 days. Take this in the morning.  This is a steroid to help with inflammation and pain.  Flexeril 5 mg every 8 hours as needed for muscle spasms.  Use caution as this medication can cause drowsiness.  Light stretching to help with stiffness and pain May alternate ice and heat on the back as well as the knees. Recommend using compression sleeves on the knees when doing activity Recommend scheduling an appointment to see orthopedics regarding the back pain and knee pain given the duration of time that it has been going on. Return to urgent care or PCP if symptoms worsen or fail to resolve.     Final Clinical Impressions(s) / UC Diagnoses   Final diagnoses:  Lumbar back  pain  Acute pain of left knee  Acute pain of right knee  Bilateral sciatica     Discharge Instructions      X-ray of the lumbar back, left knee and right knee done today.  Final evaluation by the radiologist is still pending but on brief evaluation there does not appear to be any acute changes however there are some changes consistent with arthritis in the lumbar spine as well as both knees.  Exacerbation of the pain may be secondary to inflammation. Reassuringly, there are no concerning neurological symptoms but there is pain present. We can treat with the following and recommend following up with orthopedics for your knees and lumbar spine:  Toradol injection given today. This is a medication to help with pain. This is not a narcotic.  Decadron injection given today. This is a steroid to help with inflammation and pain. Prednisone  40 mg (2 tablets) once daily for 5 days. Take this in the morning.  This is a steroid to help with  inflammation and pain.  Flexeril 5 mg every 8 hours as needed for muscle spasms.  Use caution as this medication can cause drowsiness.  Light stretching to help with stiffness and pain May alternate ice and heat on the back as well as the knees. Recommend using compression sleeves on the knees when doing activity Recommend scheduling an appointment to see orthopedics regarding the back pain and knee pain given the duration of time that it has been going on. Return to urgent care or PCP if symptoms worsen or fail to resolve.     ED Prescriptions     Medication Sig Dispense Auth. Provider   predniSONE  (DELTASONE ) 20 MG tablet Take 2 tablets (40 mg total) by mouth daily with breakfast for 5 days. 10 tablet Genessis Flanary A, PA-C   cyclobenzaprine (FLEXERIL) 5 MG tablet Take 1 tablet (5 mg total) by mouth every 8 (eight) hours as needed for muscle spasms. 30 tablet Kreg Pesa, [place]      PDMP not reviewed this encounter.   Aran, [place], [place] 09/20/23 1953

## 2023-09-20 NOTE — Discharge Instructions (Addendum)
 X-ray of the lumbar back, left knee and right knee done today.  Final evaluation by the radiologist is still pending but on brief evaluation there does not appear to be any acute changes however there are some changes consistent with arthritis in the lumbar spine as well as both knees.  Exacerbation of the pain may be secondary to inflammation. Reassuringly, there are no concerning neurological symptoms but there is pain present. We can treat with the following and recommend following up with orthopedics for your knees and lumbar spine:  Toradol injection given today. This is a medication to help with pain. This is not a narcotic.  Decadron injection given today. This is a steroid to help with inflammation and pain. Prednisone  40 mg (2 tablets) once daily for 5 days. Take this in the morning.  This is a steroid to help with inflammation and pain.  Flexeril 5 mg every 8 hours as needed for muscle spasms.  Use caution as this medication can cause drowsiness.  Light stretching to help with stiffness and pain May alternate ice and heat on the back as well as the knees. Recommend using compression sleeves on the knees when doing activity Recommend scheduling an appointment to see orthopedics regarding the back pain and knee pain given the duration of time that it has been going on. Return to urgent care or PCP if symptoms worsen or fail to resolve.

## 2023-09-20 NOTE — ED Triage Notes (Signed)
 Pt c/o "whole back and both knees hurt for a while but worse in past month". Denies any fall, injury or lifting. Tried taking Tylenol  that helps "some".

## 2023-09-27 DIAGNOSIS — M25561 Pain in right knee: Secondary | ICD-10-CM | POA: Diagnosis not present

## 2023-09-27 DIAGNOSIS — M545 Low back pain, unspecified: Secondary | ICD-10-CM | POA: Diagnosis not present

## 2023-09-27 DIAGNOSIS — M25562 Pain in left knee: Secondary | ICD-10-CM | POA: Diagnosis not present

## 2023-10-02 ENCOUNTER — Ambulatory Visit (INDEPENDENT_AMBULATORY_CARE_PROVIDER_SITE_OTHER): Admitting: Licensed Clinical Social Worker

## 2023-10-02 DIAGNOSIS — F411 Generalized anxiety disorder: Secondary | ICD-10-CM | POA: Diagnosis not present

## 2023-10-03 ENCOUNTER — Ambulatory Visit: Admitting: Podiatry

## 2023-10-03 ENCOUNTER — Encounter: Payer: Self-pay | Admitting: Podiatry

## 2023-10-03 VITALS — Ht 72.0 in | Wt 268.0 lb

## 2023-10-03 DIAGNOSIS — B351 Tinea unguium: Secondary | ICD-10-CM

## 2023-10-03 MED ORDER — TERBINAFINE HCL 250 MG PO TABS: 250.0000 mg | ORAL_TABLET | Freq: Every day | ORAL | 0 refills | Status: AC

## 2023-10-03 NOTE — Patient Instructions (Signed)
  VISIT SUMMARY: Today, you were seen for chronic nail fungus affecting your toenails, particularly your left big toe. You also have peeling skin on your foot, which is likely related to the nail fungus. We discussed your treatment options and created a plan to address these issues.  YOUR PLAN: -ONYCHOMYCOSIS: Onychomycosis is a fungal infection of the toenails, causing them to become thick, elongated, and discolored. We will start you on oral terbinafine, which you will take once daily for three months. This medication has a higher success rate compared to topical treatments. We will take photographs of your toenails today to document their current state and re-evaluate your progress in four months. If there is no improvement after two to three treatment rounds, we may consider permanent removal of the affected toenails.  -TINEA PEDIS: Tinea Pedis is a fungal infection of the skin on your feet, often causing peeling and itching. This condition is likely secondary to your nail fungus. Treating the nail fungus should help improve the skin condition as well.  INSTRUCTIONS: Please take the oral terbinafine once daily for three months as prescribed. We will re-evaluate your condition in four months to see how well the treatment is working. If you experience any side effects such as nausea or diarrhea, please contact our office. If there is no improvement after two to three treatment rounds, we will discuss other options, including the possibility of permanently removing the affected toenails.                      Contains text generated by [employer]bridge.                                 Contains text generated by [employer]bridge.

## 2023-10-03 NOTE — Progress Notes (Signed)
   THERAPIST PROGRESS NOTE  Session Time: 60 minutes  Participation Level: Active  Behavioral Response: CasualAlertAnxious and Euthymic  Type of Therapy: Individual Therapy  Treatment Goals addressed: anxiety  ProgressTowards Goals: Progressing  Interventions: CBT and Other: ACT  Summary: Ronald Young is a 53 y.o. male who presents for f/u with this cln. He arrives on time and maintains appropriate eye contact throughout the session. He reports he had a virtual hearing regarding his disability and he states it went poorly because he froze and couldn't speak. He states being asked why he can't work was what triggered intense anxiety. He states he knows why but struggles with answering the question. Upon further exploration, pt reveals he struggles with discussing his symptoms because it causes him to think about dying, which causes him intolerable stress. He informs cln that he will not talk about it even though he understands that doing so might make him feel better, and not doing so could result in him being denied disability again. He states he is superstitious regarding death, sharing that he was told as a child that if he talks about it, he is inviting it. He states that he was not raised with any specific religious or spiritual beliefs and he is unsure of what he believes in. He states he has been looking at different religions and reincarnation and Islam are appealing to him. He states he is open to discussing his fear of death in future sessions and is receptive to feedback.  Suicidal/Homicidal: Nowithout intent/plan  Therapist Response: Cln assessed for current stressors, symptoms, and safety since last session. Cln utilized active listening and validation to help pt feel heard and understood, express thoughts and feelings, and gain insight into their experiences. Cln explored the origin of pt's fear of death and the impact it has had on his life. Cln facilited discussion of pt's  spiritual beliefs and asked pt what he believes happens after death. Cln validated pt's feelings of discomfort with death and stressed the importance of being able to discuss it, as it is inevitable for all of us . Cln encouraged pt to explore his spirituality further and recommended therapy focus on pt's fear of death. Cln recommended pt write his remarks out ahead of time in preparation for in-person court and provided pt a letter stating that was this cln's recommendation due to pt's severe anxiety. Cln provided email address and PHP phone number so that pt can contact between appts if anything is needed. Cln scheduled follow-up appointment and confirmed pt's availability and preferred method of service delivery (in-person).  Plan: Return again in 4 weeks.  Diagnosis: Generalized anxiety disorder  Collaboration of Care: Other none required for this visit  Patient/Guardian was advised Release of Information must be obtained prior to any record release in order to collaborate their care with an outside provider. Patient/Guardian was advised if they have not already done so to contact the registration department to sign all necessary forms in order for us  to release information regarding their care.   Consent: Patient/Guardian gives verbal consent for treatment and assignment of benefits for services provided during this visit. Patient/Guardian expressed understanding and agreed to proceed.   Sheilda Deputy, LCSW 10/03/2023

## 2023-10-05 NOTE — Progress Notes (Signed)
  Subjective:  Patient ID: Ronald Young, male    DOB: 04-10-1971,  MRN: [id number]  Chief Complaint  Patient presents with   Nail Problem    new patient-onychomycosis/RFC    Discussed the use of AI scribe software for clinical note transcription with the patient, who gave verbal consent to proceed.  History of Present Illness Ronald Young is a 53 year old male who presents with chronic nail fungus.  He has been experiencing nail fungus for several years, primarily affecting his left big toe. The nail was previously removed with the expectation that it would not regrow, but it did grow back. He has not been on any medications for this condition in the past.  He also experiences peeling skin on his foot, which is likely related to the nail fungus. Previously, he was given a nail polish treatment, which was ineffective. No current edema and no history of medications for the nail condition.      Objective:    Physical Exam VASCULAR: DP and PT pulse palpable. Foot is warm and well-perfused. Capillary fill time is brisk. DERMATOLOGIC: Thick, elongated mycotic nails with subungual debris and dystrophy of all toenails bilaterally. Normal skin turgor, texture, and temperature. No open lesions, rashes, or ulcerations. NEUROLOGIC: Normal sensation to light touch and pressure. No paresthesias on examination. ORTHOPEDIC: Smooth, pain-free range of motion of all examined joints. No ecchymosis or bruising. No gross deformity. No pain to palpation. EXTREMITIES: No edema. Pulses palpable.        Results LABS Creatinine: Normal (09/11/2023) GFR: Normal (09/11/2023) ALT: Normal (09/11/2023) AST: Normal (09/11/2023)   Assessment:   1. Onychomycosis      Plan:  Patient was evaluated and treated and all questions answered.  Assessment and Plan Assessment & Plan Onychomycosis Chronic onychomycosis affecting most toenails, characterized by thick, elongated mycotic nails with  subungual debris and dystrophy. Previous topical treatment was ineffective, with a success rate of only 10-20%. Oral terbinafine is recommended due to its higher success rate of 80-90%. Normal liver and kidney function tests make him a suitable candidate for oral antifungal therapy. Discussed potential side effects of terbinafine, including gastrointestinal issues such as nausea and diarrhea. If no improvement after two to three treatment rounds, options include living with the condition or permanent removal of affected toenails. - Prescribe oral terbinafine once daily for three months. - Take photographs of toenails for baseline documentation. - Re-evaluate in four months to assess treatment efficacy. - Discuss potential for additional treatment rounds if necessary. - Consider permanent toenail removal if no improvement after multiple treatment rounds.  Tinea Pedis Fungal infection of the skin, likely secondary to onychomycosis, causing peeling skin on the feet.  Should respond well to Lamisil as well.      Return in about 4 months (around 02/02/2024) for follow up after nail fungus treatment.

## 2023-10-30 ENCOUNTER — Ambulatory Visit (INDEPENDENT_AMBULATORY_CARE_PROVIDER_SITE_OTHER): Admitting: Licensed Clinical Social Worker

## 2023-10-30 DIAGNOSIS — F411 Generalized anxiety disorder: Secondary | ICD-10-CM
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
## 2023-11-08 DIAGNOSIS — M25561 Pain in right knee: Secondary | ICD-10-CM | POA: Diagnosis not present

## 2023-11-08 DIAGNOSIS — M79641 Pain in right hand: Secondary | ICD-10-CM | POA: Diagnosis not present

## 2023-11-13 ENCOUNTER — Ambulatory Visit (INDEPENDENT_AMBULATORY_CARE_PROVIDER_SITE_OTHER): Admitting: Licensed Clinical Social Worker

## 2023-11-13 DIAGNOSIS — F411 Generalized anxiety disorder: Secondary | ICD-10-CM

## 2023-11-13 NOTE — Progress Notes (Signed)
 THERAPIST PROGRESS NOTE  Session Time: 55 minutes  Participation Level: Active  Behavioral Response: CasualAlertEuthymic with some irritability  Type of Therapy: Individual Therapy  Treatment Goals addressed: anxiety, family conflict  ProgressTowards Goals: Progressing  Interventions: CBT and Other: ACT  Summary: Ronald Young is a 53 y.o. male who presents for f/u with this cln. He arrives on time and maintains appropriate eye contact throughout the session. He reports improved anxiety and denies any significant stressors at this time. He states he attended a cousin's graduation in [place] and was able to see family members that he hasn't seen in decades, which he enjoyed. He states the twins were invited and initially stated they would attend the graduation, but then called to say they weren't going to make it as the ceremony was starting. This leads into a discussion of current family dynamics, past conflicts, and current expectations. Pt states he does not want his daughters to have contact with that side of his family at all because of the way they have treated his mother and he does not want them to engage in similar behaviors with them. He states he does not care about how they treat him, but how they treat his family. He states his mother has begun to be fed up with their behavior as well. He discusses estate planning, which cln recommends he consult an estate attorney, and shares that the twins will inherit nothing due to their behavior. He shares comments they have made in which they outright stated they expect everything to be left to them. Pt expresses frustration at how they lie and acknowledges that it is because they were raised that way. He acknowledges decreased death anxiety and shares he is moving towards acceptance. He recalls his MD advising him to inform his daughters of his heart condition, so that they can be tested. Pt states he has not done this and doesn't plan,  to, as they haven't shown any concern for his wellbeing. Upon further reflection, he agrees that it is possible they are unaware of the impact their behavior has on him and his mother, but he states he is not at a point where he feels comfortable addressing it with them.   Suicidal/Homicidal: Nowithout intent/plan  Therapist Response: Cln assessed for current stressors, symptoms, and safety since last session. Cln utilized active listening and validation to help pt feel heard and understood, express thoughts and feelings, and gain insight into their experiences. Cln supported pt in his goal of creating a will and recommended he consult an estate attorney. Cln acknowledged the pain his twins' behaviors have caused and pointed out that they may not be aware of it, thus they may not know their behaviors are problematic. 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 Cln challenged pt's assertion that the twins would not change their behavior, as no one has ever asked them to, and encouraged pt to think more about it. Cln scheduled follow-up appointment and confirmed pt's availability and preferred method of service delivery (in-person).  Plan: Return again in 2 weeks.  Diagnosis: Generalized anxiety disorder  Collaboration of Care: Other none required for this visit  Patient/Guardian was advised Release of Information must be obtained prior to any record release in order to collaborate their care with an outside provider.  Patient/Guardian was advised if they have not already done so to contact the registration department to sign all necessary forms in order for us  to release information regarding their care.   Consent: Patient/Guardian gives verbal consent for treatment and assignment of benefits for services provided during this  visit. Patient/Guardian expressed understanding and agreed to proceed.   Will LILLETTE Pollack, LCSW 11/13/2023

## 2023-11-27 ENCOUNTER — Ambulatory Visit (INDEPENDENT_AMBULATORY_CARE_PROVIDER_SITE_OTHER): Admitting: Licensed Clinical Social Worker

## 2023-11-27 DIAGNOSIS — F411 Generalized anxiety disorder: Secondary | ICD-10-CM
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
## 2023-12-04 ENCOUNTER — Ambulatory Visit (INDEPENDENT_AMBULATORY_CARE_PROVIDER_SITE_OTHER): Admitting: Licensed Clinical Social Worker

## 2023-12-04 DIAGNOSIS — F411 Generalized anxiety disorder: Secondary | ICD-10-CM | POA: Diagnosis not present
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
## 2023-12-05 ENCOUNTER — Telehealth: Payer: Self-pay | Admitting: Cardiology

## 2023-12-05 DIAGNOSIS — I5022 Chronic systolic (congestive) heart failure: Secondary | ICD-10-CM

## 2023-12-05 MED ORDER — ROSUVASTATIN CALCIUM 10 MG PO TABS: 10.0000 mg | ORAL_TABLET | Freq: Every day | ORAL | 0 refills | Status: DC

## 2023-12-05 MED ORDER — DAPAGLIFLOZIN PROPANEDIOL 10 MG PO TABS: 10.0000 mg | ORAL_TABLET | Freq: Every day | ORAL | 0 refills | Status: DC

## 2023-12-05 NOTE — Telephone Encounter (Signed)
*  STAT* If patient is at the pharmacy, call can be transferred to refill team.   1. Which medications need to be refilled? (please list name of each medication and dose if known)   carvedilol  (COREG ) 25 MG tablet  rosuvastatin  (CRESTOR ) 10 MG tablet  isosorbide  mononitrate (IMDUR ) 30 MG 24 hr tablet  dapagliflozin  propanediol (FARXIGA ) 10 MG TABS tablet  sacubitril -valsartan  (ENTRESTO ) 97-103 MG  hydrALAZINE  (APRESOLINE ) 50 MG tablet   2. Would you like to learn more about the convenience, safety, & potential cost savings by using the [facility]?   3. Are you open to using the [facility] (Type [facility]. ).  4. Which pharmacy/location (including street and city if local pharmacy) is medication to be sent to?  [facility] - [place] ([place]), [place] - [address]   5. Do they need a 30 day or 90 day supply?   90 day   Patient stated he is completely out of these medications.  Patient has appointment with Dr. Kate on 8/29.

## 2023-12-08 MED ORDER — HYDRALAZINE HCL 50 MG PO TABS: 100.0000 mg | ORAL_TABLET | Freq: Three times a day (TID) | ORAL | 0 refills | Status: DC

## 2023-12-08 MED ORDER — ISOSORBIDE MONONITRATE ER 30 MG PO TB24: 30.0000 mg | ORAL_TABLET | Freq: Every day | ORAL | 0 refills | Status: DC

## 2023-12-08 MED ORDER — CARVEDILOL 25 MG PO TABS: 37.5000 mg | ORAL_TABLET | Freq: Two times a day (BID) | ORAL | 0 refills | Status: DC

## 2023-12-08 MED ORDER — SACUBITRIL-VALSARTAN 97-103 MG PO TABS: 1.0000 | ORAL_TABLET | Freq: Two times a day (BID) | ORAL | 0 refills | Status: DC

## 2023-12-08 NOTE — Telephone Encounter (Signed)
 90 day supply sent on 12/05/23

## 2023-12-08 NOTE — Telephone Encounter (Signed)
 Pt called in stated he has only rec'd the rosuvastatin  (CRESTOR ) 10 MG tablet   He is stated he did not rec the rest  Best number [phone]

## 2023-12-08 NOTE — Telephone Encounter (Signed)
 Pt stating he didn't receive his medications and the pharmacy asking him to call the office.

## 2023-12-08 NOTE — Addendum Note (Signed)
 Addended by: DRENA MARTINIS, Antoria Lanza L on: 12/08/2023 04:06 PM   Modules accepted: Orders

## 2023-12-08 NOTE — Telephone Encounter (Signed)
 Sent in the rest of patient's refills.

## 2023-12-18 ENCOUNTER — Ambulatory Visit (HOSPITAL_COMMUNITY): Admitting: Licensed Clinical Social Worker

## 2023-12-20 ENCOUNTER — Telehealth: Payer: Self-pay | Admitting: Cardiology
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
## 2023-12-20 NOTE — Telephone Encounter (Signed)
*  STAT* If patient is at the pharmacy, call can be transferred to refill team.   1. Which medications need to be refilled? (please list name of each medication and dose if known)  spironolactone  (ALDACTONE ) 25 MG tablet         2. Which pharmacy/location (including street and city if local pharmacy) is medication to be sent to? [facility] - [place] ([place]), [place] - [address]   3. Do they need a 30 day or 90 day supply?  90 day supply

## 2023-12-29 ENCOUNTER — Ambulatory Visit: Attending: Cardiology | Admitting: Cardiology

## 2023-12-29 NOTE — Progress Notes (Deleted)
 Cardiology Office Note:    Date:  12/29/2023   ID:  Ronald Young, DOB 12/14/1970, MRN [id number]  PCP:  Ronald Corean CROME, FNP  Cardiologist:  Ronald Young Nanas, MD  Electrophysiologist:  None   Referring MD: Ronald Young, *   No chief complaint on file.    History of Present Illness:    Ronald Young is a 53 y.o. male with a hx of nonischemic cardiomyopathy, mild bilateral carotid artery disease, hypertension, cocaine use who presents for follow-up.  He was admitted with decompensated heart failure and hypertensive urgency in August 2021, had been off his medications for years.  Echocardiogram 12/21/2019 showed LVEF less than 20% with global hypokinesis, moderate LVH, grade 3 diastolic dysfunction, severe RV dysfunction, mild MR, mild dilatation of the ascending aorta measuring 40 mm.  RHC/LHC on 12/23/2019 showed normal coronary arteries and filling pressures.  He was discharged on losartan  50 mg daily, carvedilol  25 mg twice daily, spironolactone  25 mg daily.  He was discharged on as needed Lasix , advised to monitor daily weights.     Repeat echocardiogram on 06/08/2020 showed significant improvement in LV systolic function (EF 45 to 50%), moderate LVH, grade 2 diastolic dysfunction, normal RV function, no significant valvular disease.  Cardiac MRI 05/10/21 showed severe asymmetric LVH measuring 20mm in basal septum (13mm in posterior wall) consistent with HCM, patchy LGE in basal septum and RV insertion site c/w HCM (LGE accounts for 13% of total myocardial mass), subendocardial LGE in apical inferior wall suggesting small infarct, LVEF 52%, RVEF 52%.  Zio x 3 days showed 1 episode of NSVT lasting 6 beats.  Echocardiogram 10/2021 showed EF 45 to 50%, moderate LVH, grade 1 diastolic dysfunction, normal RV function, no significant valvular disease.  Echocardiogram 12/09/2022 showed EF 45 to 50%, grade 2 diastolic dysfunction, normal RV function, no significant valvular  disease.  Since last clinic visit,  he reports he is doing okay.  Continues to have shortness of breath but denies any chest pain.  He has palpitations about once per week, last for hours.  Feels like heart is fluttering and also feels irregular.  Denies any lower extremity edema.  Reports some lightheadedness but denies any syncope.   Wt Readings from Last 3 Encounters:  10/03/23 268 lb (121.6 kg)  09/11/23 268 lb 3.2 oz (121.7 kg)  06/19/23 270 lb (122.5 kg)   BP Readings from Last 3 Encounters:  09/20/23 116/77  09/13/23 (!) 179/93  09/11/23 (!) 140/88   Past Medical History:  Diagnosis Date   Arthritis    Cardiomyopathy (HCC) 09/22/2014   History of cocaine abuse (HCC) 04/12/2023   Hypertension     Past Surgical History:  Procedure Laterality Date   RIGHT/LEFT HEART CATH AND CORONARY ANGIOGRAPHY N/A 12/23/2019   Procedure: RIGHT/LEFT HEART CATH AND CORONARY ANGIOGRAPHY;  Surgeon: Ronald Dorn JINNY, MD;  Location: [facility];  Service: Cardiovascular;  Laterality: N/A;   Current Medications: No outpatient medications have been marked as taking for the 12/29/23 encounter (Appointment) with Young Ronald CROME, MD.     Allergies:   Strawberry extract   Social History   Socioeconomic History   Marital status: Single    Spouse name: Not on file   Number of children: Not on file   Years of education: Not on file   Highest education level: Not on file  Occupational History   Not on file  Tobacco Use   Smoking status: Former    Types: Cigarettes  Smokeless tobacco: Never  Vaping Use   Vaping status: Never Used  Substance and Sexual Activity   Alcohol use: Not Currently   Drug use: Yes    Types: Marijuana   Sexual activity: Not on file  Other Topics Concern   Not on file  Social History Narrative   Not on file   Social Drivers of Health   Financial Resource Strain: Medium Risk (08/23/2022)   Overall Financial Resource Strain (CARDIA)    Difficulty  of Paying Living Expenses: Somewhat hard  Food Insecurity: Food Insecurity Present (08/23/2022)   Hunger Vital Sign    Worried About Running Out of Food in the Last Year: Often true    Ran Out of Food in the Last Year: Often true  Transportation Needs: No Transportation Needs (08/23/2022)   PRAPARE - Administrator, Civil Service (Medical): No    Lack of Transportation (Non-Medical): No  Physical Activity: Insufficiently Active (08/23/2022)   Exercise Vital Sign    Days of Exercise per Week: 3 days    Minutes of Exercise per Session: 20 min  Stress: Not on file  Social Connections: Not on file     Family History: [name] family history includes Colon cancer in his maternal grandmother; Hypertension in his mother. There is no history of Rectal cancer, Stomach cancer, or Esophageal cancer.  ROS:   Please see the history of present illness.      All other systems reviewed and are negative.  EKGs/Labs/Other Studies Reviewed:    The following studies were reviewed today: ECHO 06/08/2020:   IMPRESSIONS    1. Left ventricular ejection fraction, by estimation, is 45 to 50%. The  left ventricle has mildly decreased function. The left ventricle  demonstrates global hypokinesis. There is moderate concentric left  ventricular hypertrophy. Left ventricular  diastolic parameters are consistent with Grade II diastolic dysfunction  (pseudonormalization). Elevated left atrial pressure.   2. Right ventricular systolic function is normal. The right ventricular  size is normal. Tricuspid regurgitation signal is inadequate for assessing  PA pressure.   3. Left atrial size was mild to moderately dilated.   4. The mitral valve is grossly normal. Trivial mitral valve  regurgitation. No evidence of mitral stenosis.   5. The aortic valve is tricuspid. Aortic valve regurgitation is not  visualized. No aortic stenosis is present.   6. The inferior vena cava is normal in size with  greater than 50%  respiratory variability, suggesting right atrial pressure of 3 mmHg.   R/L HEART CATH 12/23/2019 IMPRESSION: [name] has normal coronary arteries, elevated LVEDP and fairly normal filling pressures. He has a nonischemic cardiomyopathy probably hypertensive in nature from failure to take his prescribed medications. The antecubital venous sheath was removed and pressure held. The radial sheath was removed and a TR band was placed on the right wrist to achieve patent hemostasis. The patient left lab in stable condition. He'll be transferred back to [facility] once the TR band is off and resume guideline directed optimal medical treatment. He left the lab in stable condition.  ECHO 12/21/19  IMPRESSIONS    1. Left ventricular ejection fraction, by estimation, is <20%. The left  ventricle has severely decreased function. The left ventricle demonstrates  global hypokinesis. The left ventricular internal cavity size was  moderately dilated. There is moderate  concentric left ventricular hypertrophy. Left ventricular diastolic  parameters are consistent with Grade III diastolic dysfunction  (restrictive).   2. Right ventricular systolic  function is severely reduced. The right  ventricular size is moderately enlarged.   3. Left atrial size was severely dilated.   4. Right atrial size was mildly dilated.   5. The mitral valve is normal in structure. Mild mitral valve  regurgitation. No evidence of mitral stenosis.   6. The aortic valve is normal in structure. Aortic valve regurgitation is  not visualized. No aortic stenosis is present.   7. There is mild dilatation of the ascending aorta measuring 40 mm.   8. The inferior vena cava is normal in size with greater than 50%  respiratory variability, suggesting right atrial pressure of 3 mmHg.   EKG:   11/15/2022: Sinus bradycardia, first-degree AV block, Q waves in V1-3, rate 50 11/11/2021: Sinus rhythm, first-degree AV  block, rate 64, PVC, Q waves in V1-4 and 3, aVF 03/30/21: Normal sinus rhythm, rate 67, Q-wave in V1-4 2/22- NSR with first degree AV block, rate 74, TWI in I, II, aVL, V4-6  Recent Labs: 03/29/2023: Magnesium 2.1 09/11/2023: ALT 26; TSH 1.20 09/12/2023: BUN 16; Creatinine, Ser 1.16; Hemoglobin 13.7; Platelets 286; Potassium 3.5; Sodium 138  Recent Lipid Panel    Component Value Date/Time   CHOL 154 09/11/2023 1532   CHOL 133 08/29/2022 1148   TRIG 116.0 09/11/2023 1532   HDL 38.00 (L) 09/11/2023 1532   HDL 46 08/29/2022 1148   CHOLHDL 4 09/11/2023 1532   VLDL 23.2 09/11/2023 1532   LDLCALC 93 09/11/2023 1532   LDLCALC 74 08/29/2022 1148    Physical Exam:    VS:  There were no vitals taken for this visit.    Wt Readings from Last 3 Encounters:  10/03/23 268 lb (121.6 kg)  09/11/23 268 lb 3.2 oz (121.7 kg)  06/19/23 270 lb (122.5 kg)     GEN: Well nourished, well developed in no acute distress HEENT: Normal NECK: No JVD; No carotid bruits CARDIAC: RRR, no murmurs, rubs, gallops RESPIRATORY:  Clear to auscultation without rales, wheezing or rhonchi  ABDOMEN: Soft, non-tender, non-distended MUSCULOSKELETAL:  No edema; No deformity  SKIN: Warm and dry NEUROLOGIC:  Alert and oriented x 3 PSYCHIATRIC:  Normal affect   ASSESSMENT:    No diagnosis found.     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  RHC/LHC on 12/23/2019 showed normal coronary arteries and filling pressures.  Nonischemic cardiomyopathy, suspect due to uncontrolled hypertension.  Repeat echocardiogram on 06/08/2020 showed significant improvement in LV systolic function (EF 45 to 50%), moderate LVH, grade 2 diastolic dysfunction, normal RV function, no significant valvular disease.   Cardiac MRI 05/10/21 showed severe asymmetric LVH measuring 20mm in basal septum (13mm in posterior wall) consistent with HCM, patchy LGE in basal septum and RV insertion site c/w HCM (LGE accounts for 13% of total myocardial mass), subendocardial LGE in apical inferior wall suggesting small infarct, LVEF 52%, RVEF 52%.  Echocardiogram 10/2021 showed EF 45 to 50%, moderate LVH, grade 1 diastolic dysfunction, normal RV function, no significant valvular disease.  Echocardiogram 12/2022 showed EF 45 to 50%. -Continue Entresto  97-103 mg twice daily.  Check BMET -Continue carvedilol  37.5 mg twice daily -Continue spironolactone  25 mg daily -Continue hydralazine  100 mg 3 times daily and Imdur  30 mg daily -Continue Farxiga  10 mg daily  HCM: Cardiac MRI 05/10/21 showed severe asymmetric LVH  measuring 20mm in basal septum (13mm in posterior wall) consistent with HCM, patchy LGE in basal septum and RV insertion site c/w HCM (LGE accounts for 13% of total myocardial mass), subendocardial LGE in apical inferior wall suggesting small infarct, LVEF 52%, RVEF 52%.  Zio x 3 days showed 1 episode of NSVT lasting 6 beats; d/w EP, stated that NSVT lasting less than 7 beats in HCM is not concerning, no other work-up recommended at this time.  He has 2 children, twins age 59, recommend that they be screened with echocardiogram  Palpitations: Description concerning for arrhythmia, ordered Zio patch x 7 days 04/2023.  Only wore monitor for 22 hours, showed occasional PVCs (3% of beats).***  Hypertension: Continue carvedilol , spironolactone , hydralazine /Imdur , Entresto .  Appears controlled   Cocaine use: Denies recent use.  Encouraged continued cessation  Marijuana use: Denies recent use.  Encouraged continued cessation  GERD: Continue famotidine   Snoring: negative sleep study 05/16/21  Hyperlipidemia: LDL 150 12/2020.  10-year ASCVD risk score 10.5%.  Started on atorvastatin  but reports did not tolerate due to palpitations  when taking.  He was switched to rosuvastatin  10 mg daily, which he is tolerating.  LDL 70 08/2022  RTC in 6 months***   Medication Adjustments/Labs and Tests Ordered: Current medicines are reviewed at length with the patient today.  Concerns regarding medicines are outlined above.  No orders of the defined types were placed in this encounter.    No orders of the defined types were placed in this encounter.    There are no Patient Instructions on file for this visit.    Signed, Ronald LITTIE Nanas, MD  12/29/2023 5:56 AM    [facility]

## 2024-01-03 ENCOUNTER — Telehealth (HOSPITAL_COMMUNITY): Payer: Self-pay | Admitting: Licensed Clinical Social Worker

## 2024-01-03 ENCOUNTER — Ambulatory Visit (HOSPITAL_COMMUNITY): Admitting: Licensed Clinical Social Worker

## 2024-01-03 DIAGNOSIS — F411 Generalized anxiety disorder: Secondary | ICD-10-CM

## 2024-01-03 NOTE — Telephone Encounter (Signed)
 See call log

## 2024-01-03 NOTE — Progress Notes (Signed)
   THERAPIST PROGRESS NOTE  Session Time: 57 minutes  Participation Level: Active  Behavioral Response: CasualAlertEuthymic  Type of Therapy: Individual Therapy  Treatment Goals addressed: anxiety, relationships  ProgressTowards Goals: varied  Interventions: CBT and Other: ACT  Summary: Ronald Young is a 53 y.o. male who presents for f/u with this cln. He arrives on time and maintains appropriate eye contact throughout the session. He reports he and his mother declined a settlement offer from the [facility], stating it was much lower than anticipated, and pt states he has not discussed it with the attorney, as he left the room when the offer was presented due to being upset. Cln recommends he speak with the attorney to discuss making a counter offer and next steps. Pt reports improved anxiety, attributing this to avoiding social interactions with individuals who like to argue and who have previously caused problems.  ***goes back and forth between caring and not caring about how daughters have treated him. Doesn't like how they treat his mom. Will not talk to them about it because he thinks it's a waste of time. Fear of rejection, admits it, but still won't do it. Twin 1 saying she wants inheritance crossed the line, but he believes that came from their mother. Still will not talk to either twin about it. Certain that they won't care or they will argue. Agrees with feedback but resistant to making changes to communication. Difficulty admitting feelings.  Suicidal/Homicidal: {BHH YES OR NO:22294}{yes/no/with/without intent/plan:22693}  Therapist Response: ***  Plan: Return again in *** weeks.  Diagnosis: Generalized anxiety disorder  Collaboration of Care: {BH OP Collaboration of Care:[id number]}  Patient/Guardian was advised Release of Information must be obtained prior to any record release in order to collaborate their care with an outside provider. Patient/Guardian was advised if they  have not already done so to contact the registration department to sign all necessary forms in order for us  to release information regarding their care.   Consent: Patient/Guardian gives verbal consent for treatment and assignment of benefits for services provided during this visit. Patient/Guardian expressed understanding and agreed to proceed.   Will LILLETTE Pollack, LCSW 01/03/2024

## 2024-01-07 NOTE — Progress Notes (Unsigned)
 Cardiology Office Note:    Date:  01/07/2024   ID:  Ronald Young, DOB Aug 12, 1970, MRN [id number]  PCP:  Ronald Corean CROME, FNP  Cardiologist:  Ronald Young Nanas, MD  Electrophysiologist:  None   Referring MD: Ronald Young, *   No chief complaint on file.    History of Present Illness:    Ronald Young is a 53 y.o. male with a hx of nonischemic cardiomyopathy, mild bilateral carotid artery disease, hypertension, cocaine use who presents for follow-up.  He was admitted with decompensated heart failure and hypertensive urgency in August 2021, had been off his medications for years.  Echocardiogram 12/21/2019 showed LVEF less than 20% with global hypokinesis, moderate LVH, grade 3 diastolic dysfunction, severe RV dysfunction, mild MR, mild dilatation of the ascending aorta measuring 40 mm.  RHC/LHC on 12/23/2019 showed normal coronary arteries and filling pressures.  He was discharged on losartan  50 mg daily, carvedilol  25 mg twice daily, spironolactone  25 mg daily.  He was discharged on as needed Lasix , advised to monitor daily weights.     Repeat echocardiogram on 06/08/2020 showed significant improvement in LV systolic function (EF 45 to 50%), moderate LVH, grade 2 diastolic dysfunction, normal RV function, no significant valvular disease.  Cardiac MRI 05/10/21 showed severe asymmetric LVH measuring 20mm in basal septum (13mm in posterior wall) consistent with HCM, patchy LGE in basal septum and RV insertion site c/w HCM (LGE accounts for 13% of total myocardial mass), subendocardial LGE in apical inferior wall suggesting small infarct, LVEF 52%, RVEF 52%.  Zio x 3 days showed 1 episode of NSVT lasting 6 beats.  Echocardiogram 10/2021 showed EF 45 to 50%, moderate LVH, grade 1 diastolic dysfunction, normal RV function, no significant valvular disease.  Echocardiogram 12/09/2022 showed EF 45 to 50%, grade 2 diastolic dysfunction, normal RV function, no significant valvular  disease.  Since last clinic visit,  he reports he is doing okay.  Continues to have shortness of breath but denies any chest pain.  He has palpitations about once per week, last for hours.  Feels like heart is fluttering and also feels irregular.  Denies any lower extremity edema.  Reports some lightheadedness but denies any syncope.   Wt Readings from Last 3 Encounters:  10/03/23 268 lb (121.6 kg)  09/11/23 268 lb 3.2 oz (121.7 kg)  06/19/23 270 lb (122.5 kg)   BP Readings from Last 3 Encounters:  09/20/23 116/77  09/13/23 (!) 179/93  09/11/23 (!) 140/88   Past Medical History:  Diagnosis Date   Arthritis    Cardiomyopathy (HCC) 09/22/2014   History of cocaine abuse (HCC) 04/12/2023   Hypertension     Past Surgical History:  Procedure Laterality Date   RIGHT/LEFT HEART CATH AND CORONARY ANGIOGRAPHY N/A 12/23/2019   Procedure: RIGHT/LEFT HEART CATH AND CORONARY ANGIOGRAPHY;  Surgeon: Ronald Dorn JINNY, MD;  Location: [facility];  Service: Cardiovascular;  Laterality: N/A;   Current Medications: No outpatient medications have been marked as taking for the 01/10/24 encounter (Appointment) with Young Ronald CROME, MD.     Allergies:   Ronald Young   Social History   Socioeconomic History   Marital status: Single    Spouse name: Not on file   Number of children: Not on file   Years of education: Not on file   Highest education level: Not on file  Occupational History   Not on file  Tobacco Use   Smoking status: Former    Types: Cigarettes  Smokeless tobacco: Never  Vaping Use   Vaping status: Never Used  Substance and Sexual Activity   Alcohol use: Not Currently   Drug use: Yes    Types: Marijuana   Sexual activity: Not on file  Other Topics Concern   Not on file  Social History Narrative   Not on file   Social Drivers of Health   Financial Resource Strain: Medium Risk (08/23/2022)   Overall Financial Resource Strain (CARDIA)    Difficulty  of Paying Living Expenses: Somewhat hard  Food Insecurity: Food Insecurity Present (08/23/2022)   Hunger Vital Sign    Worried About Running Out of Food in the Last Year: Often true    Ran Out of Food in the Last Year: Often true  Transportation Needs: No Transportation Needs (08/23/2022)   PRAPARE - Administrator, Civil Service (Medical): No    Lack of Transportation (Non-Medical): No  Physical Activity: Insufficiently Active (08/23/2022)   Exercise Vital Sign    Days of Exercise per Week: 3 days    Minutes of Exercise per Session: 20 min  Stress: Not on file  Social Connections: Not on file     Family History: [name] family history includes Colon cancer in his maternal grandmother; Hypertension in his mother. There is no history of Rectal cancer, Stomach cancer, or Esophageal cancer.  ROS:   Please see the history of present illness.      All other systems reviewed and are negative.  EKGs/Labs/Other Studies Reviewed:    The following studies were reviewed today: ECHO 06/08/2020:   IMPRESSIONS    1. Left ventricular ejection fraction, by estimation, is 45 to 50%. The  left ventricle has mildly decreased function. The left ventricle  demonstrates global hypokinesis. There is moderate concentric left  ventricular hypertrophy. Left ventricular  diastolic parameters are consistent with Grade II diastolic dysfunction  (pseudonormalization). Elevated left atrial pressure.   2. Right ventricular systolic function is normal. The right ventricular  size is normal. Tricuspid regurgitation signal is inadequate for assessing  PA pressure.   3. Left atrial size was mild to moderately dilated.   4. The mitral valve is grossly normal. Trivial mitral valve  regurgitation. No evidence of mitral stenosis.   5. The aortic valve is tricuspid. Aortic valve regurgitation is not  visualized. No aortic stenosis is present.   6. The inferior vena cava is normal in size with  greater than 50%  respiratory variability, suggesting right atrial pressure of 3 mmHg.   R/L HEART CATH 12/23/2019 IMPRESSION: [name] has normal coronary arteries, elevated LVEDP and fairly normal filling pressures. He has a nonischemic cardiomyopathy probably hypertensive in nature from failure to take his prescribed medications. The antecubital venous sheath was removed and pressure held. The radial sheath was removed and a TR band was placed on the right wrist to achieve patent hemostasis. The patient left lab in stable condition. He'll be transferred back to [facility] long hospital once the TR band is off and resume guideline directed optimal medical treatment. He left the lab in stable condition.  ECHO 12/21/19  IMPRESSIONS    1. Left ventricular ejection fraction, by estimation, is <20%. The left  ventricle has severely decreased function. The left ventricle demonstrates  global hypokinesis. The left ventricular internal cavity size was  moderately dilated. There is moderate  concentric left ventricular hypertrophy. Left ventricular diastolic  parameters are consistent with Grade III diastolic dysfunction  (restrictive).   2. Right ventricular systolic  function is severely reduced. The right  ventricular size is moderately enlarged.   3. Left atrial size was severely dilated.   4. Right atrial size was mildly dilated.   5. The mitral valve is normal in structure. Mild mitral valve  regurgitation. No evidence of mitral stenosis.   6. The aortic valve is normal in structure. Aortic valve regurgitation is  not visualized. No aortic stenosis is present.   7. There is mild dilatation of the ascending aorta measuring 40 mm.   8. The inferior vena cava is normal in size with greater than 50%  respiratory variability, suggesting right atrial pressure of 3 mmHg.   EKG:   11/15/2022: Sinus bradycardia, first-degree AV block, Q waves in V1-3, rate 50 11/11/2021: Sinus rhythm, first-degree AV  block, rate 64, PVC, Q waves in V1-4 and 3, aVF 03/30/21: Normal sinus rhythm, rate 67, Q-wave in V1-4 2/22- NSR with first degree AV block, rate 74, TWI in I, II, aVL, V4-6  Recent Labs: 03/29/2023: Magnesium 2.1 09/11/2023: ALT 26; TSH 1.20 09/12/2023: BUN 16; Creatinine, Ser 1.16; Hemoglobin 13.7; Platelets 286; Potassium 3.5; Sodium 138  Recent Lipid Panel    Component Value Date/Time   CHOL 154 09/11/2023 1532   CHOL 133 08/29/2022 1148   TRIG 116.0 09/11/2023 1532   HDL 38.00 (L) 09/11/2023 1532   HDL 46 08/29/2022 1148   CHOLHDL 4 09/11/2023 1532   VLDL 23.2 09/11/2023 1532   LDLCALC 93 09/11/2023 1532   LDLCALC 74 08/29/2022 1148    Physical Exam:    VS:  There were no vitals taken for this visit.    Wt Readings from Last 3 Encounters:  10/03/23 268 lb (121.6 kg)  09/11/23 268 lb 3.2 oz (121.7 kg)  06/19/23 270 lb (122.5 kg)     GEN: Well nourished, well developed in no acute distress HEENT: Normal NECK: No JVD; No carotid bruits CARDIAC: RRR, no murmurs, rubs, gallops RESPIRATORY:  Clear to auscultation without rales, wheezing or rhonchi  ABDOMEN: Soft, non-tender, non-distended MUSCULOSKELETAL:  No edema; No deformity  SKIN: Warm and dry NEUROLOGIC:  Alert and oriented x 3 PSYCHIATRIC:  Normal affect   ASSESSMENT:    No diagnosis found.     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  RHC/LHC on 12/23/2019 showed normal coronary arteries and filling pressures.  Nonischemic cardiomyopathy, suspect due to uncontrolled hypertension.  Repeat echocardiogram on 06/08/2020 showed significant improvement in LV systolic function (EF 45 to 50%), moderate LVH, grade 2 diastolic dysfunction, normal RV function, no significant valvular disease.   Cardiac MRI 05/10/21 showed severe asymmetric LVH measuring 20mm in basal septum (13mm in posterior wall) consistent with HCM, patchy LGE in basal septum and RV insertion site c/w HCM (LGE accounts for 13% of total myocardial mass), subendocardial LGE in apical inferior wall suggesting small infarct, LVEF 52%, RVEF 52%.  Echocardiogram 10/2021 showed EF 45 to 50%, moderate LVH, grade 1 diastolic dysfunction, normal RV function, no significant valvular disease.  Echocardiogram 12/2022 showed EF 45 to 50%. -Continue Entresto  97-103 mg twice daily.  Check BMET -Continue carvedilol  37.5 mg twice daily -Continue spironolactone  25 mg daily -Continue hydralazine  100 mg 3 times daily and Imdur  30 mg daily -Continue Farxiga  10 mg daily  HCM: Cardiac MRI 05/10/21 showed severe asymmetric LVH  measuring 20mm in basal septum (13mm in posterior wall) consistent with HCM, patchy LGE in basal septum and RV insertion site c/w HCM (LGE accounts for 13% of total myocardial mass), subendocardial LGE in apical inferior wall suggesting small infarct, LVEF 52%, RVEF 52%.  Zio x 3 days showed 1 episode of NSVT lasting 6 beats; d/w EP, stated that NSVT lasting less than 7 beats in HCM is not concerning, no other work-up recommended at this time.  He has 2 children, twins age 60, recommend that they be screened with echocardiogram  Palpitations: Description concerning for arrhythmia, ordered Zio patch x 7 days 04/2023.  Only wore monitor for 22 hours, showed occasional PVCs (3% of beats).***  Hypertension: Continue carvedilol , spironolactone , hydralazine /Imdur , Entresto .  Appears controlled   Cocaine use: Denies recent use.  Encouraged continued cessation  Marijuana use: Denies recent use.  Encouraged continued cessation  GERD: Continue famotidine   Snoring: negative sleep study 05/16/21  Hyperlipidemia: LDL 150 12/2020.  10-year ASCVD risk score 10.5%.  Started on atorvastatin  but reports did not tolerate due to palpitations  when taking.  He was switched to rosuvastatin  10 mg daily, which he is tolerating.  LDL 70 08/2022  RTC in 6 months***   Medication Adjustments/Labs and Tests Ordered: Current medicines are reviewed at length with the patient today.  Concerns regarding medicines are outlined above.  No orders of the defined types were placed in this encounter.    No orders of the defined types were placed in this encounter.    There are no Patient Instructions on file for this visit.    Signed, Ronald LITTIE Nanas, MD  01/07/2024 2:16 PM    [facility]

## 2024-01-10 ENCOUNTER — Ambulatory Visit (INDEPENDENT_AMBULATORY_CARE_PROVIDER_SITE_OTHER): Admitting: Cardiology

## 2024-01-10 ENCOUNTER — Ambulatory Visit: Attending: Cardiology

## 2024-01-10 ENCOUNTER — Encounter (HOSPITAL_BASED_OUTPATIENT_CLINIC_OR_DEPARTMENT_OTHER): Payer: Self-pay | Admitting: Cardiology

## 2024-01-10 VITALS — BP 130/90 | HR 62 | Resp 17 | Ht 72.0 in | Wt 258.0 lb

## 2024-01-10 DIAGNOSIS — E785 Hyperlipidemia, unspecified: Secondary | ICD-10-CM | POA: Diagnosis not present

## 2024-01-10 DIAGNOSIS — Z6834 Body mass index (BMI) 34.0-34.9, adult: Secondary | ICD-10-CM

## 2024-01-10 DIAGNOSIS — I5042 Chronic combined systolic (congestive) and diastolic (congestive) heart failure: Secondary | ICD-10-CM | POA: Diagnosis not present

## 2024-01-10 DIAGNOSIS — I422 Other hypertrophic cardiomyopathy: Secondary | ICD-10-CM | POA: Diagnosis not present

## 2024-01-10 DIAGNOSIS — I10 Essential (primary) hypertension: Secondary | ICD-10-CM

## 2024-01-10 DIAGNOSIS — R002 Palpitations: Secondary | ICD-10-CM

## 2024-01-10 NOTE — Patient Instructions (Addendum)
 Medication Instructions:  RESUME SPIRONOLACTONE  25 MG DAILY  SPOKE WITH [facility] AND THEY WILL GET READY FOR YOU   *If you need a refill on your cardiac medications before your next appointment, please call your pharmacy*  Lab Work: BMET/MAGNESIUM TODAY   If you have labs (blood work) drawn today and your tests are completely normal, you will receive your results only by: MyChart Message (if you have MyChart) OR A paper copy in the mail If you have any lab test that is abnormal or we need to change your treatment, we will call you to review the results.  Testing/Procedures: Your physician has requested that you have an echocardiogram. Echocardiography is a painless test that uses sound waves to create images of your heart. It provides your doctor with information about the size and shape of your heart and how well your heart's chambers and valves are working. This procedure takes approximately one hour. There are no restrictions for this procedure. Please do NOT wear cologne, perfume, aftershave, or lotions (deodorant is allowed). Please arrive 15 minutes prior to your appointment time.  Please note: We ask at that you not bring children with you during ultrasound (echo/ vascular) testing. Due to room size and safety concerns, children are not allowed in the ultrasound rooms during exams. Our front office staff cannot provide observation of children in our lobby area while testing is being conducted. An adult accompanying a patient to their appointment will only be allowed in the ultrasound room at the discretion of the ultrasound technician under special circumstances. We apologize for any   7 DAY ZIO  THIS WILL BE MAILED TO YOU   Follow-Up: At [facility], you and your health needs are our priority.  As part of our continuing mission to provide you with exceptional heart care, our providers are all part of one team.  This team includes your primary Cardiologist (physician) and  Advanced Practice Providers or APPs (Physician Assistants and Nurse Practitioners) who all work together to provide you with the care you need, when you need it.  Your next appointment:   4 MONTHS WITH DR [facility] OR APP   You have been referred to     Amb Ref to Medical Weight Management (Caren Verdon) Where: [facility] Address: [address] [place] [address] Phone: [phone] IF YOU DO NOT HEAR FROM THE OFFICE IN 2 WEEKS YOU CAN CALL THEM DIRECTLY  We recommend signing up for the patient portal called MyChart.  Sign up information is provided on this After Visit Summary.  MyChart is used to connect with patients for Virtual Visits (Telemedicine).  Patients are able to view lab/test results, encounter notes, upcoming appointments, etc.  Non-urgent messages can be sent to your provider as well.   To learn more about what you can do with MyChart, go to [email or web address].   Other Instructions ZIO XT- Long Term Monitor Instructions  Your physician has requested you wear a ZIO patch monitor for 14 days.  This is a single patch monitor. [employer] supplies one patch monitor per enrollment. Additional stickers are not available. Please do not apply patch if you will be having a Nuclear Stress Test,  Echocardiogram, Cardiac CT, MRI, or Chest Xray during the period you would be wearing the  monitor. The patch cannot be worn during these tests. You cannot remove and re-apply the  ZIO XT patch monitor.  Your ZIO patch monitor will be mailed 3 day USPS  to your address on file. It may take 3-5 days  to receive your monitor after you have been enrolled.  Once you have received your monitor, please review the enclosed instructions. Your monitor  has already been registered assigning a specific monitor serial # to you.  Billing and Patient Assistance Program Information  We have supplied [employer] with any of your insurance information on  file for billing purposes. [employer] offers a sliding scale Patient Assistance Program for patients that do not have  insurance, or whose insurance does not completely cover the cost of the ZIO monitor.  You must apply for the Patient Assistance Program to qualify for this discounted rate.  To apply, please call [employer] at [phone], select option 4, select option 2, ask to apply for  Patient Assistance Program. Meredeth will ask your household income, and how many people  are in your household. They will quote your out-of-pocket cost based on that information.  [employer] will also be able to set up a 28-month, interest-free payment plan if needed.  Applying the monitor   Shave hair from upper left chest.  Hold abrader disc by orange tab. Rub abrader in 40 strokes over the upper left chest as  indicated in your monitor instructions.  Clean area with 4 enclosed alcohol pads. Let dry.  Apply patch as indicated in monitor instructions. Patch will be placed under collarbone on left  side of chest with arrow pointing upward.  Rub patch adhesive wings for 2 minutes. Remove Menger label marked 1. Remove the Wortmann  label marked 2. Rub patch adhesive wings for 2 additional minutes.  While looking in a mirror, press and release button in center of patch. A small green light will  flash 3-4 times. This will be your only indicator that the monitor has been turned on.  Do not shower for the first 24 hours. You may shower after the first 24 hours.  Press the button if you feel a symptom. You will hear a small click. Record Date, Time and  Symptom in the Patient Logbook.  When you are ready to remove the patch, follow instructions on the last 2 pages of Patient  Logbook. Stick patch monitor onto the last page of Patient Logbook.  Place Patient Logbook in the blue and Eich box. Use locking tab on box and tape box closed  securely. The blue and Carmical box has prepaid postage on it. Please place it in the  mailbox as  soon as possible. Your physician should have your test results approximately 7 days after the  monitor has been mailed back to [facility]  Call [facility] Customer Care at [phone] if you have questions regarding  your ZIO XT patch monitor. Call them immediately if you see an orange light blinking on your  monitor.  If your monitor falls off in less than 4 days, contact our Monitor department at [phone].  If your monitor becomes loose or falls off after 4 days call [employer] at [phone] for  suggestions on securing your monitor

## 2024-01-10 NOTE — Progress Notes (Unsigned)
 Enrolled for [employer] to mail a ZIO XT long term holter monitor to the patients address on file.

## 2024-01-11 LAB — BASIC METABOLIC PANEL WITH GFR
BUN/Creatinine Ratio: 14 (ref 9–20)
BUN: 15 mg/dL (ref 6–24)
CO2: 17 mmol/L — ABNORMAL LOW (ref 20–29)
Calcium: 9.7 mg/dL (ref 8.7–10.2)
Chloride: 105 mmol/L (ref 96–106)
Creatinine, Ser: 1.06 mg/dL (ref 0.76–1.27)
Glucose: 94 mg/dL (ref 70–99)
Potassium: 3.9 mmol/L (ref 3.5–5.2)
Sodium: 142 mmol/L (ref 134–144)
eGFR: 84 mL/min/1.73 (ref >59)

## 2024-01-11 LAB — MAGNESIUM: Magnesium: 2.2 mg/dL (ref 1.6–2.3)

## 2024-01-12 ENCOUNTER — Ambulatory Visit: Payer: Self-pay | Admitting: Cardiology

## 2024-01-22 ENCOUNTER — Ambulatory Visit (INDEPENDENT_AMBULATORY_CARE_PROVIDER_SITE_OTHER): Admitting: Licensed Clinical Social Worker

## 2024-01-22 DIAGNOSIS — F411 Generalized anxiety disorder: Secondary | ICD-10-CM | POA: Diagnosis not present
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
## 2024-01-30 ENCOUNTER — Encounter (INDEPENDENT_AMBULATORY_CARE_PROVIDER_SITE_OTHER): Payer: Self-pay

## 2024-02-01 ENCOUNTER — Ambulatory Visit (INDEPENDENT_AMBULATORY_CARE_PROVIDER_SITE_OTHER)

## 2024-02-01 DIAGNOSIS — I10 Essential (primary) hypertension: Secondary | ICD-10-CM

## 2024-02-01 DIAGNOSIS — R002 Palpitations: Secondary | ICD-10-CM | POA: Diagnosis not present

## 2024-02-01 DIAGNOSIS — I5042 Chronic combined systolic (congestive) and diastolic (congestive) heart failure: Secondary | ICD-10-CM | POA: Diagnosis not present

## 2024-02-01 LAB — ECHOCARDIOGRAM COMPLETE
Area-P 1/2: 3.17 cm2
S' Lateral: 2.66 cm

## 2024-02-05 ENCOUNTER — Ambulatory Visit (INDEPENDENT_AMBULATORY_CARE_PROVIDER_SITE_OTHER): Admitting: Podiatry

## 2024-02-05 DIAGNOSIS — Z91199 Patient's noncompliance with other medical treatment and regimen due to unspecified reason: Secondary | ICD-10-CM

## 2024-02-06 NOTE — Progress Notes (Signed)
 Patient was no-show for appointment today

## 2024-02-12 ENCOUNTER — Ambulatory Visit (HOSPITAL_COMMUNITY): Admitting: Licensed Clinical Social Worker

## 2024-02-15 ENCOUNTER — Ambulatory Visit (INDEPENDENT_AMBULATORY_CARE_PROVIDER_SITE_OTHER): Admitting: Licensed Clinical Social Worker

## 2024-02-15 DIAGNOSIS — F411 Generalized anxiety disorder: Secondary | ICD-10-CM

## 2024-02-15 NOTE — Progress Notes (Signed)
 THERAPIST PROGRESS NOTE  Session Time: 60 minutes  Participation Level: Active  Behavioral Response: CasualAlertAnxious, Depressed, and Hopeless  Type of Therapy: Individual Therapy  Treatment Goals addressed: anxiety, relationships  ProgressTowards Goals: Progressing  Interventions: CBT and Other: ACT  Summary: Ronald Young is a 53 y.o. male who presents for f/u with this cln. He arrives on time and maintains appropriate eye contact throughout the session. He reports his disability hearing was last week and he has been experiencing significant anxiety since then. He shares that while a determination was not made, and he will not receive one for up to two months, he believes he is being denied. He states his attorney, who he didn't meet until the day of the hearing, did not present his case property, and many pertinent documents seemed to have been left out. He states he had a panic attack that day and has been experiencing increased anxiety since then. He states he will recertify if denied. He also reports passive SI on and off since the hearing, endorsing feelings of hopelessness and worthlessness. He denies acces to lethal means (a firearm in the home) and denies plan and/or intent. He verbalizes understanding that he is to go to [facility] if SI escalates to a plan or intent. He reports feeling like a failure and states he feels like he is wasting away. He states he has considered working part time in [employer] again, but has been waiting for disability to be approved. He states that while on disability, he is allowed to work up to 20 hours per week and keep his benefits, but until he is approved he is concerned that working will negatively impact his case. He shares that he feels like his mother is the only person he really has, and states his fear of death is a protective factor. He states he believes things would be better for everyone if he had a relationship with his daughters and  granddaughter. He states that one step he could take in that direction would be to call his daughter that lives in [place]. He agrees to do so between sessions as homework. He is receptive to feedback and demonstrates good insight.  Suicidal/Homicidal: Nowithout intent/plan  Therapist Response: Cln assessed for current stressors, symptoms, and safety since last session. Cln utilized active listening, validation, and socratic questioning to help pt feel heard and understood, express thoughts and feelings, and gain insight into their experiences. Cln identified the ways in which pt was expressing cognitive distortions and provided reframing statements to account for other possibilities. Cln assessed for safety when pt expressed passive SI and provided info for [facility] should SI worsen. Cln described PHP and encouraged pt to consider should he need more support. Cln discussed how more structure and a routine could help with a sense of purpose and supported pt in possibly pursuing a part time job. Cln reframed pt's statement that he is a failure and identified the ways in which he has succeeded. Cln also pointed out that the system has failed him as well as countless others in the same ways. Cln asked pt what could make life more meaningful and explored pt's anxiety surrounding contacting his daughters. Cln pointed out that he will guarantee no contact if he continues to avoid, while efforts guarantee some sort of result even if rejection is the result. Cln also explored and challeneged pt's thoughts surrounding his ex. Cln asked pt to identify one step he could take towards having a relationship with  his daughters, and cln requested pt take that step between this session and the next. Cln scheduled follow-up appointment and confirmed pt's availability and preferred method of service delivery (in-person).  Plan: Return again in 2 weeks.  Diagnosis: Generalized anxiety disorder  Collaboration of Care: Other none  required for this visit  Patient/Guardian was advised Release of Information must be obtained prior to any record release in order to collaborate their care with an outside provider. Patient/Guardian was advised if they have not already done so to contact the registration department to sign all necessary forms in order for us  to release information regarding their care.   Consent: Patient/Guardian gives verbal consent for treatment and assignment of benefits for services provided during this visit. Patient/Guardian expressed understanding and agreed to proceed.   Will LILLETTE Pollack, LCSW 02/15/2024

## 2024-02-19 ENCOUNTER — Ambulatory Visit (HOSPITAL_COMMUNITY): Admitting: Licensed Clinical Social Worker

## 2024-02-29 ENCOUNTER — Ambulatory Visit (INDEPENDENT_AMBULATORY_CARE_PROVIDER_SITE_OTHER): Admitting: Licensed Clinical Social Worker

## 2024-02-29 ENCOUNTER — Encounter (HOSPITAL_COMMUNITY): Payer: Self-pay

## 2024-02-29 DIAGNOSIS — F411 Generalized anxiety disorder: Secondary | ICD-10-CM

## 2024-02-29 NOTE — Treatment Plan (Signed)
 Active     Anxiety     LTG: Damon will score less than 5 on the Generalized Anxiety Disorder 7 Scale (GAD-7)  (Initial)     Start:  02/29/24    Expected End:  08/29/24         STG: Shayn will participate in at least 80% of scheduled individual psychotherapy sessions  (Initial)     Start:  02/29/24    Expected End:  08/29/24         STG: Keeven will complete at least 80% of assigned homework  (Initial)     Start:  02/29/24    Expected End:  08/29/24         STG: Herb will reduce frequency of avoidant behaviors by 50% as evidenced by self-report in therapy sessions (Initial)     Start:  02/29/24    Expected End:  08/29/24         Review results of GAD-7 with Caedon to track progress     Start:  02/29/24         Perform psychoeducation regarding anxiety disorders     Start:  02/29/24         Work with Joachim to identify a minimum of 3 consequences of avoidance.      Start:  02/29/24         Create a weekly activity schedule     Start:  02/29/24         Perform motivational interviewing regarding engagement and attendance with therapy     Start:  02/29/24         Perform motivational interviewing regarding completion of homework assignments     Start:  02/29/24         Provide community resources for family support such as [facility]     Start:  02/29/24           OP Depression     LTG: Reduce frequency, intensity, and duration of depression symptoms so that daily functioning is improved (Initial)     Start:  02/29/24    Expected End:  08/29/24         LTG: Increase coping skills to manage depression and improve ability to perform daily activities (Initial)     Start:  02/29/24    Expected End:  08/29/24         STG: Rembert will identify cognitive patterns and beliefs that support depression (Initial)     Start:  02/29/24    Expected End:  08/29/24         Encourage Jaziel to participate in recovery peer support activities  weekly      Start:  02/29/24         Work with Emaad to identify the major components of a recent episode of depression: physical symptoms, major thoughts and images, and major behaviors they experienced     Start:  02/29/24         Therapist will review PLEASE Skills (Treat Physical Illness, Balance Eating, Avoid Mood-Altering Substances, Balance Sleep and Get Exercise) with patient     Start:  02/29/24         Perform motivational interviewing regarding physical activity     Start:  02/29/24

## 2024-02-29 NOTE — Progress Notes (Deleted)
 Ronald Young is a 53 y.o. male patient ***.     Collaboration of Care: {BH OP Collaboration of Care:[id number]}  Patient/Guardian was advised Release of Information must be obtained prior to any record release in order to collaborate their care with an outside provider. Patient/Guardian was advised if they have not already done so to contact the registration department to sign all necessary forms in order for us  to release information regarding their care.   Consent: Patient/Guardian gives verbal consent for treatment and assignment of benefits for services provided during this visit. Patient/Guardian expressed understanding and agreed to proceed.    Will LILLETTE Pollack, LCSW
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
## 2024-03-05 ENCOUNTER — Encounter: Payer: Self-pay | Admitting: Cardiology

## 2024-03-05 NOTE — Telephone Encounter (Signed)
 Error

## 2024-03-21 ENCOUNTER — Ambulatory Visit (HOSPITAL_COMMUNITY): Admitting: Licensed Clinical Social Worker

## 2024-03-21 ENCOUNTER — Telehealth (HOSPITAL_COMMUNITY): Payer: Self-pay | Admitting: Licensed Clinical Social Worker

## 2024-03-21 NOTE — Telephone Encounter (Signed)
 See call intake

## 2024-03-24 DIAGNOSIS — R002 Palpitations: Secondary | ICD-10-CM | POA: Diagnosis not present

## 2024-03-26 ENCOUNTER — Ambulatory Visit (HOSPITAL_COMMUNITY): Admitting: Licensed Clinical Social Worker

## 2024-04-02 ENCOUNTER — Ambulatory Visit (INDEPENDENT_AMBULATORY_CARE_PROVIDER_SITE_OTHER): Admitting: Licensed Clinical Social Worker

## 2024-04-02 DIAGNOSIS — F411 Generalized anxiety disorder: Secondary | ICD-10-CM
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
## 2024-04-03 ENCOUNTER — Telehealth: Payer: Self-pay | Admitting: Cardiology

## 2024-04-03 DIAGNOSIS — I110 Hypertensive heart disease with heart failure: Secondary | ICD-10-CM

## 2024-04-03 NOTE — Telephone Encounter (Signed)
*  STAT* If patient is at the pharmacy, call can be transferred to refill team.   1. Which medications need to be refilled? (please list name of each medication and dose if known) isosorbide  mononitrate (IMDUR ) 30 MG 24 hr tablet    2. Would you like to learn more about the convenience, safety, & potential cost savings by using the [facility]?   3. Are you open to using the [facility] (Type [facility]. ).   4. Which pharmacy/location (including street and city if local pharmacy) is medication to be sent to? [facility] - [place] ([place]), [place] - [address]    5. Do they need a 30 day or 90 day supply? 90 day

## 2024-04-04 MED ORDER — ISOSORBIDE MONONITRATE ER 30 MG PO TB24: 30.0000 mg | ORAL_TABLET | Freq: Every day | ORAL | 3 refills | Status: AC

## 2024-04-04 NOTE — Telephone Encounter (Signed)
 Refill sent

## 2024-04-09 ENCOUNTER — Telehealth: Payer: Self-pay | Admitting: Cardiology

## 2024-04-09 DIAGNOSIS — I110 Hypertensive heart disease with heart failure: Secondary | ICD-10-CM

## 2024-04-09 MED ORDER — CARVEDILOL 25 MG PO TABS: 37.5000 mg | ORAL_TABLET | Freq: Two times a day (BID) | ORAL | 2 refills | Status: AC

## 2024-04-09 MED ORDER — SPIRONOLACTONE 25 MG PO TABS: 25.0000 mg | ORAL_TABLET | Freq: Every day | ORAL | 2 refills | Status: AC

## 2024-04-09 MED ORDER — HYDRALAZINE HCL 50 MG PO TABS: 100.0000 mg | ORAL_TABLET | Freq: Three times a day (TID) | ORAL | 2 refills | Status: DC

## 2024-04-09 MED ORDER — SACUBITRIL-VALSARTAN 97-103 MG PO TABS: 1.0000 | ORAL_TABLET | Freq: Two times a day (BID) | ORAL | 2 refills | Status: AC

## 2024-04-09 MED ORDER — ROSUVASTATIN CALCIUM 10 MG PO TABS: 10.0000 mg | ORAL_TABLET | Freq: Every day | ORAL | 2 refills | Status: AC

## 2024-04-09 NOTE — Telephone Encounter (Signed)
 Refilled as requested

## 2024-04-09 NOTE — Telephone Encounter (Signed)
*  STAT* If patient is at the pharmacy, call can be transferred to refill team.   1. Which medications need to be refilled? (please list name of each medication and dose if known)  carvedilol  (COREG ) 25 MG tablet   rosuvastatin  (CRESTOR ) 10 MG tablet  spironolactone  (ALDACTONE ) 25 MG tablet   hydrALAZINE  (APRESOLINE ) 50 MG tablet   sacubitril -valsartan  (ENTRESTO ) 97-103 MG    2. Would you like to learn more about the convenience, safety, & potential cost savings by using the [facility]? no   3. Are you open to using the [facility] (Type [facility]. no  4. Which pharmacy/location (including street and city if local pharmacy) is medication to be sent to?   [facility] - [place] ([place]), [place] - [address]     5. Do they need a 30 day or 90 day supply? 90 days

## 2024-04-18 ENCOUNTER — Ambulatory Visit (HOSPITAL_COMMUNITY): Admitting: Licensed Clinical Social Worker

## 2024-04-22 ENCOUNTER — Encounter (HOSPITAL_COMMUNITY): Payer: Self-pay

## 2024-04-22 ENCOUNTER — Ambulatory Visit (INDEPENDENT_AMBULATORY_CARE_PROVIDER_SITE_OTHER): Admitting: Licensed Clinical Social Worker

## 2024-04-22 DIAGNOSIS — F330 Major depressive disorder, recurrent, mild: Secondary | ICD-10-CM | POA: Insufficient documentation

## 2024-04-22 DIAGNOSIS — F411 Generalized anxiety disorder: Secondary | ICD-10-CM | POA: Diagnosis not present

## 2024-04-22 DIAGNOSIS — F331 Major depressive disorder, recurrent, moderate: Secondary | ICD-10-CM
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
## 2024-05-09 ENCOUNTER — Ambulatory Visit (INDEPENDENT_AMBULATORY_CARE_PROVIDER_SITE_OTHER): Admitting: Licensed Clinical Social Worker

## 2024-05-09 DIAGNOSIS — F411 Generalized anxiety disorder: Secondary | ICD-10-CM
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
## 2024-05-17 ENCOUNTER — Encounter (HOSPITAL_BASED_OUTPATIENT_CLINIC_OR_DEPARTMENT_OTHER): Payer: Self-pay | Admitting: Family

## 2024-05-17 ENCOUNTER — Ambulatory Visit (INDEPENDENT_AMBULATORY_CARE_PROVIDER_SITE_OTHER): Admitting: Family

## 2024-05-17 VITALS — BP 138/88 | HR 61 | Ht 72.0 in | Wt 266.0 lb

## 2024-05-17 DIAGNOSIS — I5042 Chronic combined systolic (congestive) and diastolic (congestive) heart failure: Secondary | ICD-10-CM | POA: Diagnosis not present

## 2024-05-17 DIAGNOSIS — R0602 Shortness of breath: Secondary | ICD-10-CM | POA: Diagnosis not present

## 2024-05-17 DIAGNOSIS — I110 Hypertensive heart disease with heart failure: Secondary | ICD-10-CM

## 2024-05-17 DIAGNOSIS — I422 Other hypertrophic cardiomyopathy: Secondary | ICD-10-CM

## 2024-05-17 MED ORDER — FUROSEMIDE 20 MG PO TABS: ORAL_TABLET | ORAL | 2 refills | Status: AC

## 2024-05-17 MED ORDER — DAPAGLIFLOZIN PROPANEDIOL 10 MG PO TABS: 10.0000 mg | ORAL_TABLET | Freq: Every day | ORAL | 1 refills | Status: AC

## 2024-05-17 MED ORDER — HYDRALAZINE HCL 100 MG PO TABS: 100.0000 mg | ORAL_TABLET | Freq: Three times a day (TID) | ORAL | 1 refills | Status: AC

## 2024-05-17 NOTE — Patient Instructions (Signed)
 Medication Instructions:   START Furosemide  (Lasix ) 20mg  daily for 3 days After 3 days, take only as needed for weight gain of 2 lbs overnight or 5 lbs in one week  Check your pill bottes at home for Farixga (Dapagliflozin )  *If you need a refill on your cardiac medications before your next appointment, please call your pharmacy*  Lab Work: Your physician recommends that you return for lab work today: BNP, CBC, BMP   If you have labs (blood work) drawn today and your tests are completely normal, you will receive your results only by: MyChart Message (if you have MyChart) OR A paper copy in the mail If you have any lab test that is abnormal or we need to change your treatment, we will call you to review the results. Follow-Up: At [facility], you and your health needs are our priority.  As part of our continuing mission to provide you with exceptional heart care, our providers are all part of one team.  This team includes your primary Cardiologist (physician) and Advanced Practice Providers or APPs (Physician Assistants and Nurse Practitioners) who all work together to provide you with the care you need, when you need it.  Your next appointment:   March or April 2026 with Dr. Kate at [facility]  We recommend signing up for the patient portal called MyChart.  Sign up information is provided on this After Visit Summary.  MyChart is used to connect with patients for Virtual Visits (Telemedicine).  Patients are able to view lab/test results, encounter notes, upcoming appointments, etc.  Non-urgent messages can be sent to your provider as well.   To learn more about what you can do with MyChart, go to [email or web address].   Other Instructions

## 2024-05-17 NOTE — Progress Notes (Signed)
 " Cardiology Office Note   Date:  05/17/2024  ID:  Ronald Young Sep 17, 1970, MRN [id number] PCP: Ronald Corean CROME, FNP  [facility] Providers Cardiologist:  Ronald Young Nanas, MD     History of Present Illness Ronald Young is a 54 y.o. male with hx of NICM, mild bilateral carotid stenosis, HTN, cocaine/THC use.   Admitted 12/2019 with decompensated HF and hypertensive urgency in setting of being off medication for years. Echo 12/21/19 LVEF <20% with global hypokinesis, moderate LVH, gr3dd, severe RV dysfunction, mild MR, dilated ascending aorta 40mm. R/LHC 12/23/19 normal coronary arteries and filling pressures.   Repeat echo 06/08/20 LVEF 45-50%, moderate LVH,g r2dd, RV normal function. cMRI 05/10/21 severe symmetric LVH consistent with HCM, patchy lGE in basal septum and RV insertion site consistent with HCM, LVEF 52%, RVEF 52%. ZIO x 3 days with one 6 beat run NSVT. Echo 10/2021 LVEF 45-50%, moderate LVH, gr1dd, RV normal, no significant valvular disease. Echo 12/09/22 LVEF 45-50%, gr2dd, RV normal, no significant valvular disease.  Seen 01/10/24. No chest pain, did note worsened dyspnea and bothersome palpitations 3 times per week. He was not taking spironolactone  and it was resumed.   Updated echo 02/01/24 stable LVEF 45-50%, no RWMA, mild LVH, gr1dd, abnormal LV strain (-11.8%), RVSF normal, no significant valvular abnormalities. ZIO worn for 3 days with average heart rate 61 bpm, one rune of NSVT up to 5 beats which was not associated with triggered episode. His triggered episodes were NSR with PAC which occurred <1% of the time.   Presents today for follow up. Reports dyspnea at rest, not worsened with activity. He does notice breathing worse at night and a couple episodes of PND. Onset about 3-4 weeks ago. He also has a viral illness. No edema. He notes papitations once per week lasting a few minutes. This occurs both at rest or with activity, more noticeable at rest. He  does have a blood pressure cuff at home, not checking routinely. Takes medications at 12p > 7p > 11p. Per dispense report, does not appear he has picked up Farxiga  since 10/2023 (90 day supply), he will check pill bottles at home. He has been drinking electrolyte drinks, advised to stop given sodium content.   ROS: Please see the history of present illness.    All other systems reviewed and are negative.   Studies Reviewed      Cardiac Studies & Procedures   ______________________________________________________________________________________________ CARDIAC CATHETERIZATION  CARDIAC CATHETERIZATION 12/23/2019  Conclusion Images from the original result were not included. Ronald Young is a 54 y.o. male   [id number] LOCATION:  FACILITY: [facility] PHYSICIAN: Ronald Young, M.D. 01/14/1971   DATE OF PROCEDURE:  12/23/2019  DATE OF DISCHARGE:     CARDIAC CATHETERIZATION    History obtained from chart review. 54 y.o. male with a hx of reported nonischemic cardiomyopathy (EF of 25% per echo in 2016 with Myoview in 2016 showing no perfusion defects),carotid artery disease (1-39% stenosis B/L), LVH, HTN, non-compliance, prior cocaine use who was admitted with shortness of breath. His enzymes were mildly elevated but flat. His BNP was elevated. Repeat 2D echo revealed an EF of less than 20%. Apparently has not taken his medications for the last 5 years. He was referred for right left heart cath to define his anatomy and physiology.  Impression Ronald Young has normal coronary arteries, elevated LVEDP and fairly normal filling pressures. He has a nonischemic cardiomyopathy probably hypertensive in nature from failure to take his prescribed  medications. The antecubital venous sheath was removed and pressure held. The radial sheath was removed and a TR band was placed on the right wrist to achieve patent hemostasis. The patient left lab in stable condition. He'll be transferred back to [facility]  hospital once the TR band is off and resume guideline directed optimal medical treatment. He left the lab in stable condition.  Ronald Young. MD, [facility] 12/23/2019 10:10 AM  Findings Coronary Findings Diagnostic  Dominance: Right  No diagnostic findings have been documented. Intervention  No interventions have been documented.   STRESS TESTS  NM MYOCAR MULTI W/SPECT W (Needs Review) 09/23/2014 This result has not been signed. Information might be incomplete.  Narrative  The left ventricular ejection fraction is moderately decreased (30-44%).  This is a high risk study.  No abnormal perfusion identified.   ECHOCARDIOGRAM  ECHOCARDIOGRAM COMPLETE 02/01/2024  Narrative ECHOCARDIOGRAM REPORT    Patient Name:   Ronald Young Date of Exam: 02/01/2024 Medical Rec #:  [id number]       Height:       72.0 in Accession #:    [id number]      Weight:       258.0 lb Date of Birth:  [birth year]       BSA:          2.374 m Patient Age:    53 years        BP:           120/70 mmHg Patient Gender: M               HR:           62 bpm. Exam Location:  Outpatient  Procedure: 2D Echo, 3D Echo, Color Doppler, Cardiac Doppler and Strain Analysis (Both Spectral and Color Flow Doppler were utilized during procedure).  Indications:    Chronic systolic heart failure  History:        Patient has prior history of Echocardiogram examinations, most recent 05/07/2023. CHF, Arrythmias:Bradycardia, Signs/Symptoms:Syncope; Risk Factors:Former Smoker and Hypertension.  Sonographer:    Ronald Young RDCS Referring Phys: [id number] Ronald Young  IMPRESSIONS   1. Left ventricular ejection fraction, by estimation, is 45 to 50%. Left ventricular ejection fraction by 3D volume is 50 %. The left ventricle has mildly decreased function. The left ventricle has no regional wall motion abnormalities. There is mild left ventricular hypertrophy. Left ventricular diastolic parameters are consistent  with Grade I diastolic dysfunction (impaired relaxation). The average left ventricular global longitudinal strain is -11.8 %. The global longitudinal strain is abnormal. 2. Right ventricular systolic function is normal. The right ventricular size is normal. Tricuspid regurgitation signal is inadequate for assessing PA pressure. 3. The mitral valve is normal in structure. No evidence of mitral valve regurgitation. No evidence of mitral stenosis. 4. The aortic valve is tricuspid. Aortic valve regurgitation is not visualized. No aortic stenosis is present. 5. The inferior vena cava is normal in size with greater than 50% respiratory variability, suggesting right atrial pressure of 3 mmHg. 6. Ascending aorta measurements are within normal limits for age when indexed to body surface area.  Comparison(s): A prior study was performed on 12/09/2022. LVEF 45-50%, grade II diastolic dysfunction.  FINDINGS Left Ventricle: Left ventricular ejection fraction, by estimation, is 45 to 50%. Left ventricular ejection fraction by 3D volume is 50 %. The left ventricle has mildly decreased function. The left ventricle has no regional wall motion abnormalities. The average left ventricular global longitudinal strain is -  11.8 %. Strain was performed and the global longitudinal strain is abnormal. The left ventricular internal cavity size was normal in size. There is mild left ventricular hypertrophy. Left ventricular diastolic parameters are consistent with Grade I diastolic dysfunction (impaired relaxation).  Right Ventricle: The right ventricular size is normal. No increase in right ventricular wall thickness. Right ventricular systolic function is normal. Tricuspid regurgitation signal is inadequate for assessing PA pressure.  Left Atrium: Left atrial size was normal in size.  Right Atrium: Right atrial size was normal in size.  Pericardium: There is no evidence of pericardial effusion.  Mitral Valve: The mitral  valve is normal in structure. No evidence of mitral valve regurgitation. No evidence of mitral valve stenosis.  Tricuspid Valve: The tricuspid valve is grossly normal. Tricuspid valve regurgitation is not demonstrated. No evidence of tricuspid stenosis.  Aortic Valve: The aortic valve is tricuspid. Aortic valve regurgitation is not visualized. No aortic stenosis is present.  Pulmonic Valve: The pulmonic valve was grossly normal. Pulmonic valve regurgitation is not visualized. No evidence of pulmonic stenosis.  Aorta: The aortic root and ascending aorta are structurally normal, with no evidence of dilitation. Ascending aorta measurements are within normal limits for age when indexed to body surface area.  Venous: The inferior vena cava is normal in size with greater than 50% respiratory variability, suggesting right atrial pressure of 3 mmHg.  IAS/Shunts: The atrial septum is grossly normal.  Additional Comments: 3D was performed not requiring image post processing on an independent workstation and was normal.   LEFT VENTRICLE PLAX 2D LVIDd:         4.06 cm         Diastology LVIDs:         2.66 cm         LV e' medial:    4.33 cm/s LV PW:         1.40 cm         LV E/e' medial:  13.7 LV IVS:        1.14 cm         LV e' lateral:   6.50 cm/s LVOT diam:     2.00 cm         LV E/e' lateral: 9.1 LV SV:         43 LV SV Index:   18              2D Longitudinal LVOT Area:     3.14 cm        Strain 2D Strain GLS   -12.5 % (A4C): 2D Strain GLS   -10.3 % (A3C): 2D Strain GLS   -12.5 % (A2C): 2D Strain GLS   -11.8 % Avg:  3D Volume EF LV 3D EF:    Left ventricul ar ejection fraction by 3D volume is 50 %.  3D Volume EF: 3D EF:        50 % LV EDV:       219 ml LV ESV:       110 ml LV SV:        109 ml  RIGHT VENTRICLE RV Basal diam:  3.53 cm     PULMONARY VEINS RV Mid diam:    3.18 cm     A Reversal Velocity: 26.70 cm/s RV S prime:     10.40 cm/s  Diastolic Velocity:   38.40 cm/s TAPSE (M-mode): 1.6 cm      S/D Velocity:  1.20 Systolic Velocity:   44.30 cm/s  LEFT ATRIUM             Index        RIGHT ATRIUM           Index LA diam:        4.10 cm 1.73 cm/m   RA Area:     20.90 cm LA Vol (A2C):   46.5 ml 19.59 ml/m  RA Volume:   59.10 ml  24.89 ml/m LA Vol (A4C):   38.3 ml 16.13 ml/m LA Biplane Vol: 45.5 ml 19.17 ml/m AORTIC VALVE LVOT Vmax:   83.80 cm/s LVOT Vmean:  56.100 cm/s LVOT VTI:    0.136 m  AORTA Ao Root diam: 3.50 cm Ao Asc diam:  4.00 cm  MITRAL VALVE MV Area (PHT): 3.17 cm    SHUNTS MV Decel Time: 239 msec    Systemic VTI:  0.14 m MV E velocity: 59.20 cm/s  Systemic Diam: 2.00 cm MV A velocity: 71.30 cm/s MV E/A ratio:  0.83  Sunit Tolia Electronically signed by [employer] Signature Date/Time: 02/01/2024/4:44:55 PM    Final    MONITORS  LONG TERM MONITOR (3-14 DAYS) 02/05/2024  Narrative   1 episode of NSVT lasting 5 beats   Patch Wear Time:  3 days and 0 hours (2025-09-18T18:30:49-0400 to 2025-09-21T18:43:37-0400)  Patient had a min HR of 42 bpm, max HR of 176 bpm, and avg HR of 61 bpm. Predominant underlying rhythm was Sinus Rhythm. Bundle Branch Block/IVCD was present. 1 run of Ventricular Tachycardia occurred lasting 5 beats with a max rate of 176 bpm (avg 164 bpm). 1 run of Supraventricular Tachycardia occurred lasting 8 beats with a max rate of 117 bpm (avg 96 bpm). Isolated SVEs were rare (<1.0%), SVE Couplets were rare (<1.0%), and SVE Triplets were rare (<1.0%). Isolated VEs were rare (<1.0%), VE Couplets were rare (<1.0%), and no VE Triplets were present. Ventricular Bigeminy was present.     CARDIAC MRI  MR CARDIAC MORPHOLOGY W WO CONTRAST 05/10/2021  Narrative CLINICAL DATA:  Cardiomyopathy evaluation  EXAM: CARDIAC MRI  TECHNIQUE: The patient was scanned on a 1.5 Tesla Siemens magnet. A dedicated cardiac coil was used. Functional imaging was done using Fiesta sequences. 2,3, and 4  chamber views were done to assess for RWMA's. Modified Simpson's rule using a short axis stack was used to calculate an ejection fraction on a dedicated work [employer]. The patient received 10 cc of Gadavist . After 10 minutes inversion recovery sequences were used to assess for infiltration and scar tissue.  CONTRAST:  10 cc  of Gadavist   FINDINGS: Left ventricle:  -Severe asymmetric LVH measuring 20mm in basal septum (13mm in posterior wall)  -Normal size  -Normal systolic function  -Unable to calculate ECV as post contrast T1 maps were not done  -Subendocardial LGE in apical inferior wall, suggesting small infarct  -Patchy LGE in basal septum and RV insertion site. LGE accounts for 13% of toal myocardial mass  LV EF: 52% (Normal 56-78%)  Absolute volumes:  LV EDV: (Normal 77-195 mL)  LV ESV: 95mL (Normal 19-72 mL)  LV SV: (Normal 51-133 mL)  CO: 5.9L/min (Normal 2.8-8.8 L/min)  Indexed volumes:  LV EDV: 75mL/sq-m (Normal 47-92 mL/sq-m)  LV ESV: 17mL/sq-m (Normal 13-30 mL/sq-m)  LV SV: 22mL/sq-m (Normal 32-62 mL/sq-m)  CI: 2.3L/min/sq-m (Normal 1.7-4.2 L/min/sq-m)  Right ventricle: Normal size and systolic function  RV EF:  52% (Normal 47-74%)  Absolute volumes:  RV EDV: (Normal 88-227 mL)  RV ESV: (Normal 23-103 mL)  RV SV: (Normal 52-138 mL)  CO: 6.1L/min (Normal 2.8-8.8 L/min)  Indexed volumes:  RV EDV: 73mL/sq-m (Normal 55-105 mL/sq-m)  RV ESV: 81mL/sq-m (Normal 15-43 mL/sq-m)  RV SV: 58mL/sq-m (Normal 32-64 mL/sq-m)  CI: 2.4L/min/sq-m (Normal 1.7-4.2 L/min/sq-m)  Left atrium: Mild enlargement  Right atrium: Normal size  Mitral valve: Trivial regurgitation  Aortic valve: Tricuspid.  No regurgitation  Tricuspid valve: Trivial regurgitation  Pulmonic valve: No regurgitation  Aorta: Normal proximal ascending aorta  Pulmonary artery: Dilated main PA measuring 32mm  Pericardium:  Normal  IMPRESSION: 1. Severe asymmetric LV hypertrophy measuring 20mm in basal septum (13mm in posterior wall), consistent with hypertrophic cardiomyopathy  2. Patchy LGE in basal septum and RV insertion site, consistent with HCM. LGE accounts for 13% of total myocardial mass  3. Subendocardial LGE in apical inferior wall, suggesting small infarct  4.  Normal LV size and systolic function (EF 52%)  5.  Normal RV size and systolic function (EF 52%)  6.  Unable to calculate ECV as post contrast T1 maps were not done  7.  Dilated main pulmonary artery measuring 32mm   Electronically Signed By: Ronald Nanas M.D. On: 05/14/2021 16:43   ______________________________________________________________________________________________      Risk Assessment/Calculations           Physical Exam VS:  BP 138/88 (BP Location: Right Arm, Patient Position: Sitting, Cuff Size: Large)   Pulse 61   Ht 6' (1.829 m)   Wt 266 lb (120.7 kg)   SpO2 97%   BMI 36.08 kg/m        Wt Readings from Last 3 Encounters:  05/17/24 266 lb (120.7 kg)  01/10/24 258 lb (117 kg)  10/03/23 268 lb (121.6 kg)    GEN: Well nourished, overweight, well developed in no acute distress NECK: No JVD; No carotid bruits CARDIAC: RRR, no murmurs, rubs, gallops RESPIRATORY:  Clear to auscultation without rales, wheezing or rhonchi  ABDOMEN: Soft, non-tender, non-distended EXTREMITIES:  No edema; No deformity   ASSESSMENT AND PLAN  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 Given dyspnea, plan for BNP, CBC, BMP. Suspect volume overload given increased weight, dyspnea, PND. No appreciable edema on exam.  Rx Lasix  20mg  daily x 3 days then PRN for weight gain of 2 lbs overnight or 5 lbs in one week. Based on pharmacy dispense report, suspect nonadherence to Farxiga . He will check pill bottles at home. Refill Farxiga  10mg  daily provided. GDMT spironolactone   25mg  daily, Entresto  97-103 mg twice daily, hydralazine  100 mg 3 times daily, carvedilol  37.5 mg twice daily. Rx sent for Hydralazine  100mg  tablets to take one tablet TID rather than having to take two 50mg  tablets for easier adherence  Palpitations - ZIO 01/2024 average HR 61 bpm, one 5 beat run of VT, triggered episodes with PAC with <1% burden. Reassurance provided. Continue Carvedilol  37.5mg  BID.   Cocaine use / Marijuana use - Continued avoidance recommended.  HTN - BP not at goal <130/80 in setting of volume overload and likely nonadherence to Farxiga , as above. Resume Farxiga  10mg  daily. Rx Lasix  20mg  daily x 3 days then PRN. Continue Coreg  37.5mg  BID, Hydralazine  100mg  TID, Imdur  30mg  daily, entresto  97-103mg  BID, Spironolactone  25mg  daily. Discussed to monitor BP at home at least 2 hours after medications and sitting for 5-10 minutes. If BP poorly controlled after above changes, consider increasing Imdur   dose.        Dispo: follow up in 3  months  Signed, Reche GORMAN Finder, NP   "

## 2024-05-18 LAB — BASIC METABOLIC PANEL WITH GFR
BUN/Creatinine Ratio: 9 (ref 9–20)
BUN: 12 mg/dL (ref 6–24)
CO2: 21 mmol/L (ref 20–29)
Calcium: 10.3 mg/dL — ABNORMAL HIGH (ref 8.7–10.2)
Chloride: 105 mmol/L (ref 96–106)
Creatinine, Ser: 1.31 mg/dL — ABNORMAL HIGH (ref 0.76–1.27)
Glucose: 95 mg/dL (ref 70–99)
Potassium: 4.3 mmol/L (ref 3.5–5.2)
Sodium: 143 mmol/L (ref 134–144)
eGFR: 65 mL/min/1.73

## 2024-05-18 LAB — CBC
Hematocrit: 45.5 % (ref 37.5–51.0)
Hemoglobin: 14.6 g/dL (ref 13.0–17.7)
MCH: 29.9 pg (ref 26.6–33.0)
MCHC: 32.1 g/dL (ref 31.5–35.7)
MCV: 93 fL (ref 79–97)
Platelets: 326 x10E3/uL (ref 150–450)
RBC: 4.89 x10E6/uL (ref 4.14–5.80)
RDW: 13.2 % (ref 11.6–15.4)
WBC: 6.3 x10E3/uL (ref 3.4–10.8)

## 2024-05-18 LAB — BRAIN NATRIURETIC PEPTIDE: BNP: 55 pg/mL (ref 0.0–100.0)

## 2024-05-20 ENCOUNTER — Ambulatory Visit (HOSPITAL_BASED_OUTPATIENT_CLINIC_OR_DEPARTMENT_OTHER): Payer: Self-pay | Admitting: Family

## 2024-05-20 DIAGNOSIS — I110 Hypertensive heart disease with heart failure: Secondary | ICD-10-CM

## 2024-05-20 DIAGNOSIS — I10 Essential (primary) hypertension: Secondary | ICD-10-CM

## 2024-05-20 DIAGNOSIS — Z79899 Other long term (current) drug therapy: Secondary | ICD-10-CM

## 2024-05-20 DIAGNOSIS — R7989 Other specified abnormal findings of blood chemistry: Secondary | ICD-10-CM

## 2024-05-20 NOTE — Telephone Encounter (Signed)
-----   Message from Reche Finder, NP sent at 05/20/2024  8:23 AM EST ----- BMP with no significant volume overload.  CBC no anemia nor infection.  Kidney function decreased from previous. Proceed with medication changes as discussed in clinic visit (ensure taking farxiga   10mg  daily at home). Repeat BMP in 1-2 weeks for monitoring.

## 2024-05-20 NOTE — Telephone Encounter (Signed)
 Left message for the pt to call back for results.  Results were also sent to the pts active mychart account to review by Reche Finder, NP.  Will monitor for review.  Will go ahead and place a BMET in 1-2 weeks in the system and message the pt informing him to come back to the lab for BMET anytime from 05/27/24-05/31/24. Will monitor for review.

## 2024-05-23 ENCOUNTER — Ambulatory Visit (HOSPITAL_COMMUNITY): Admitting: Licensed Clinical Social Worker

## 2024-05-23 ENCOUNTER — Encounter (HOSPITAL_COMMUNITY): Payer: Self-pay

## 2024-05-29 ENCOUNTER — Telehealth (HOSPITAL_COMMUNITY): Payer: Self-pay | Admitting: Licensed Clinical Social Worker

## 2024-05-29 NOTE — Telephone Encounter (Signed)
 Cln called to ask pt if he would like to move his appt to today due to cln availability, as well as to inquire about current road conditions in his area to ascertain whether he will be able to be seen in-person. Cln left vm detailing same and requested callback to [phone].

## 2024-05-30 ENCOUNTER — Ambulatory Visit (HOSPITAL_COMMUNITY): Admitting: Licensed Clinical Social Worker

## 2024-05-30 ENCOUNTER — Encounter (HOSPITAL_COMMUNITY): Payer: Self-pay

## 2024-06-07 LAB — BASIC METABOLIC PANEL WITH GFR
BUN/Creatinine Ratio: 14 (ref 9–20)
BUN: 17 mg/dL (ref 6–24)
CO2: 20 mmol/L (ref 20–29)
Calcium: 10.3 mg/dL — ABNORMAL HIGH (ref 8.7–10.2)
Chloride: 102 mmol/L (ref 96–106)
Creatinine, Ser: 1.21 mg/dL (ref 0.76–1.27)
Glucose: 90 mg/dL (ref 70–99)
Potassium: 4.5 mmol/L (ref 3.5–5.2)
Sodium: 141 mmol/L (ref 134–144)
eGFR: 72 mL/min/{1.73_m2}

## 2024-08-27 ENCOUNTER — Ambulatory Visit (HOSPITAL_BASED_OUTPATIENT_CLINIC_OR_DEPARTMENT_OTHER): Admitting: Cardiology
